# Patient Record
Sex: Female | Born: 1945
Health system: Southern US, Community
[De-identification: ages and names within clinical notes are randomized; demographics above are authoritative.]

## PROBLEM LIST (undated history)

## (undated) DIAGNOSIS — K219 Gastro-esophageal reflux disease without esophagitis: Secondary | ICD-10-CM

## (undated) DIAGNOSIS — Z98811 Dental restoration status: Secondary | ICD-10-CM

## (undated) DIAGNOSIS — E039 Hypothyroidism, unspecified: Secondary | ICD-10-CM

## (undated) DIAGNOSIS — Z9221 Personal history of antineoplastic chemotherapy: Secondary | ICD-10-CM

## (undated) DIAGNOSIS — Z923 Personal history of irradiation: Secondary | ICD-10-CM

## (undated) DIAGNOSIS — M1990 Unspecified osteoarthritis, unspecified site: Secondary | ICD-10-CM

## (undated) DIAGNOSIS — Z853 Personal history of malignant neoplasm of breast: Secondary | ICD-10-CM

## (undated) DIAGNOSIS — R05 Cough: Secondary | ICD-10-CM

## (undated) DIAGNOSIS — E785 Hyperlipidemia, unspecified: Secondary | ICD-10-CM

## (undated) DIAGNOSIS — J3489 Other specified disorders of nose and nasal sinuses: Secondary | ICD-10-CM

## (undated) DIAGNOSIS — D72819 Decreased white blood cell count, unspecified: Secondary | ICD-10-CM

## (undated) DIAGNOSIS — Z8489 Family history of other specified conditions: Secondary | ICD-10-CM

## (undated) HISTORY — DX: Hypothyroidism, unspecified: E03.9

## (undated) HISTORY — DX: Gastro-esophageal reflux disease without esophagitis: K21.9

## (undated) HISTORY — PX: VARICOSE VEIN SURGERY: SHX832

## (undated) HISTORY — PX: CHOLECYSTECTOMY: SHX55

## (undated) HISTORY — DX: Decreased white blood cell count, unspecified: D72.819

## (undated) HISTORY — DX: Hyperlipidemia, unspecified: E78.5

---

## 1981-08-19 HISTORY — PX: TUBAL LIGATION: SHX77

## 1988-08-19 HISTORY — PX: KNEE ARTHROSCOPY: SUR90

## 1997-11-01 ENCOUNTER — Other Ambulatory Visit: Admission: RE | Admit: 1997-11-01 | Discharge: 1997-11-01 | Payer: Self-pay | Admitting: Obstetrics & Gynecology

## 1997-12-19 ENCOUNTER — Other Ambulatory Visit: Admission: RE | Admit: 1997-12-19 | Discharge: 1997-12-19 | Payer: Self-pay | Admitting: Obstetrics & Gynecology

## 1998-12-21 ENCOUNTER — Other Ambulatory Visit: Admission: RE | Admit: 1998-12-21 | Discharge: 1998-12-21 | Payer: Self-pay | Admitting: Obstetrics & Gynecology

## 2000-01-21 ENCOUNTER — Other Ambulatory Visit: Admission: RE | Admit: 2000-01-21 | Discharge: 2000-01-21 | Payer: Self-pay | Admitting: Obstetrics & Gynecology

## 2001-06-19 ENCOUNTER — Ambulatory Visit (HOSPITAL_COMMUNITY): Admission: RE | Admit: 2001-06-19 | Discharge: 2001-06-19 | Payer: Self-pay | Admitting: Gastroenterology

## 2001-09-24 ENCOUNTER — Other Ambulatory Visit: Admission: RE | Admit: 2001-09-24 | Discharge: 2001-09-24 | Payer: Self-pay | Admitting: Obstetrics & Gynecology

## 2003-02-01 ENCOUNTER — Encounter (INDEPENDENT_AMBULATORY_CARE_PROVIDER_SITE_OTHER): Payer: Self-pay | Admitting: Specialist

## 2003-02-01 ENCOUNTER — Ambulatory Visit (HOSPITAL_COMMUNITY): Admission: RE | Admit: 2003-02-01 | Discharge: 2003-02-01 | Payer: Self-pay | Admitting: Specialist

## 2003-02-01 HISTORY — PX: CYST EXCISION: SHX5701

## 2003-04-28 ENCOUNTER — Other Ambulatory Visit: Admission: RE | Admit: 2003-04-28 | Discharge: 2003-04-28 | Payer: Self-pay | Admitting: Obstetrics & Gynecology

## 2004-08-01 ENCOUNTER — Other Ambulatory Visit: Admission: RE | Admit: 2004-08-01 | Discharge: 2004-08-01 | Payer: Self-pay | Admitting: Obstetrics & Gynecology

## 2005-09-06 ENCOUNTER — Other Ambulatory Visit: Admission: RE | Admit: 2005-09-06 | Discharge: 2005-09-06 | Payer: Self-pay | Admitting: Obstetrics & Gynecology

## 2006-09-02 ENCOUNTER — Ambulatory Visit: Payer: Self-pay | Admitting: Family Medicine

## 2006-12-09 ENCOUNTER — Ambulatory Visit: Payer: Self-pay | Admitting: Family Medicine

## 2007-07-01 ENCOUNTER — Ambulatory Visit: Payer: Self-pay | Admitting: Family Medicine

## 2007-08-24 ENCOUNTER — Ambulatory Visit: Payer: Self-pay | Admitting: Family Medicine

## 2007-09-30 ENCOUNTER — Ambulatory Visit: Payer: Self-pay | Admitting: Family Medicine

## 2007-10-18 HISTORY — PX: COLONOSCOPY: SHX174

## 2007-10-29 ENCOUNTER — Ambulatory Visit: Payer: Self-pay | Admitting: Family Medicine

## 2007-12-01 ENCOUNTER — Ambulatory Visit: Payer: Self-pay | Admitting: Family Medicine

## 2007-12-15 ENCOUNTER — Ambulatory Visit: Payer: Self-pay | Admitting: Family Medicine

## 2007-12-31 ENCOUNTER — Encounter: Admission: RE | Admit: 2007-12-31 | Discharge: 2007-12-31 | Payer: Self-pay | Admitting: Family Medicine

## 2007-12-31 ENCOUNTER — Ambulatory Visit: Payer: Self-pay | Admitting: Family Medicine

## 2008-07-28 ENCOUNTER — Ambulatory Visit: Payer: Self-pay | Admitting: Family Medicine

## 2008-09-26 ENCOUNTER — Ambulatory Visit: Payer: Self-pay | Admitting: Family Medicine

## 2008-10-13 ENCOUNTER — Ambulatory Visit: Payer: Self-pay | Admitting: Family Medicine

## 2008-11-17 ENCOUNTER — Ambulatory Visit: Payer: Self-pay | Admitting: Family Medicine

## 2008-11-21 ENCOUNTER — Encounter: Admission: RE | Admit: 2008-11-21 | Discharge: 2008-11-21 | Payer: Self-pay | Admitting: Obstetrics & Gynecology

## 2009-01-11 ENCOUNTER — Ambulatory Visit: Payer: Self-pay | Admitting: Family Medicine

## 2009-09-11 ENCOUNTER — Ambulatory Visit: Payer: Self-pay | Admitting: Family Medicine

## 2009-11-21 ENCOUNTER — Ambulatory Visit: Payer: Self-pay | Admitting: Family Medicine

## 2009-11-30 ENCOUNTER — Encounter: Admission: RE | Admit: 2009-11-30 | Discharge: 2009-11-30 | Payer: Self-pay | Admitting: Obstetrics & Gynecology

## 2010-01-01 ENCOUNTER — Ambulatory Visit: Payer: Self-pay | Admitting: Physician Assistant

## 2010-08-28 ENCOUNTER — Ambulatory Visit: Admit: 2010-08-28 | Payer: Self-pay | Admitting: Family Medicine

## 2010-10-04 ENCOUNTER — Ambulatory Visit (INDEPENDENT_AMBULATORY_CARE_PROVIDER_SITE_OTHER): Payer: BC Managed Care – PPO | Admitting: Family Medicine

## 2010-10-04 DIAGNOSIS — J0100 Acute maxillary sinusitis, unspecified: Secondary | ICD-10-CM

## 2010-11-06 ENCOUNTER — Other Ambulatory Visit: Payer: Self-pay | Admitting: Obstetrics & Gynecology

## 2010-11-06 DIAGNOSIS — Z1231 Encounter for screening mammogram for malignant neoplasm of breast: Secondary | ICD-10-CM

## 2010-12-04 ENCOUNTER — Ambulatory Visit
Admission: RE | Admit: 2010-12-04 | Discharge: 2010-12-04 | Disposition: A | Discharge status: Home / Self Care | Payer: BC Managed Care – PPO | Source: Ambulatory Visit | Attending: Obstetrics & Gynecology | Admitting: Obstetrics & Gynecology

## 2010-12-04 DIAGNOSIS — Z1231 Encounter for screening mammogram for malignant neoplasm of breast: Secondary | ICD-10-CM

## 2010-12-07 ENCOUNTER — Encounter: Payer: Self-pay | Admitting: Family Medicine

## 2010-12-08 ENCOUNTER — Encounter: Payer: Self-pay | Admitting: Family Medicine

## 2010-12-12 HISTORY — PX: KNEE ARTHROSCOPY: SHX127

## 2010-12-31 ENCOUNTER — Ambulatory Visit (INDEPENDENT_AMBULATORY_CARE_PROVIDER_SITE_OTHER): Payer: BC Managed Care – PPO | Admitting: Family Medicine

## 2010-12-31 ENCOUNTER — Other Ambulatory Visit: Payer: Self-pay | Admitting: Family Medicine

## 2010-12-31 ENCOUNTER — Encounter: Payer: Self-pay | Admitting: Family Medicine

## 2010-12-31 DIAGNOSIS — Z0000 Encounter for general adult medical examination without abnormal findings: Secondary | ICD-10-CM

## 2010-12-31 DIAGNOSIS — E7800 Pure hypercholesterolemia, unspecified: Secondary | ICD-10-CM | POA: Insufficient documentation

## 2010-12-31 DIAGNOSIS — K219 Gastro-esophageal reflux disease without esophagitis: Secondary | ICD-10-CM | POA: Insufficient documentation

## 2010-12-31 DIAGNOSIS — E559 Vitamin D deficiency, unspecified: Secondary | ICD-10-CM

## 2010-12-31 DIAGNOSIS — Z23 Encounter for immunization: Secondary | ICD-10-CM

## 2010-12-31 DIAGNOSIS — E039 Hypothyroidism, unspecified: Secondary | ICD-10-CM

## 2010-12-31 LAB — POCT URINALYSIS DIPSTICK
Bilirubin, UA: NEGATIVE
Blood, UA: NEGATIVE
Leukocytes, UA: NEGATIVE
Nitrite, UA: NEGATIVE
Spec Grav, UA: NEGATIVE

## 2010-12-31 LAB — LIPID PANEL
Total CHOL/HDL Ratio: 3.8 Ratio
Triglycerides: 132 mg/dL (ref <150)
VLDL: 26 mg/dL (ref 0–40)

## 2010-12-31 NOTE — Progress Notes (Signed)
Mckenzie Webb is a 65 y.o. female who presents for a complete physical.  She has the following concerns: None.  She sees Dr. Stann Mainland for her GYN, last exam 2 weeks ago.  Had knee surgery L knee a few weeks ago, and is doing well.  Needs all rx's refilled (Nexium, Lovaza, levothyroxine)--actually doesn't need refills right away.  Can't recall name of new pharmacy. Will call with name when she needs the refills (for a year supply).  Takes the Nexium every day, never has reflux symptoms.  Had labs done in January at work--normal CBC (except WBC 3.7), TSH 2.810, Vitamin D was low at 24.7 but added 1000 IU of Vitamin D daily since then.  TSH was increased from 88 to 100 in November (November labs showed TSH 5.090).  Immunization History  Administered Date(s) Administered  . DTaP 04/13/2001  . Influenza Whole 05/19/2000, 05/19/2001  . Pneumococcal Conjugate 06/16/2000   Last Pap smear: per Dr. Stann Mainland, 2 weeks ago Last mammogram: 2 weeks ago Last colonoscopy: 3/09 Last DEXA: 11/2009--normal Gets flu shots annually. H/o shingles in past (face) Dentist and ophtho regularly  Past Medical History  Diagnosis Date  . Thyroid disease   . DDD (degenerative disc disease), cervical   . Psoriasis   . GERD (gastroesophageal reflux disease)   . Allergy   . Interstitial cystitis   . Leukopenia   . Edema   . Hyperlipidemia   . Diabetes mellitus   . Obesity   . DJD (degenerative joint disease), cervical 04 2002  . Hypothyroid     Past Surgical History  Procedure Date  . Cholecystectomy   . Bilateral tubal ligation 1983  . Knee arthroscopy 1990    right knee  . Colonoscopy 3/09  . Diagnostic mammogram 10 27 2003    negative  . Varicose vein surgery 09/2009 R, 06/2010 L  . Knee arthroscopy 12/12/2010    Left knee (Dr. Noemi Chapel)    History   Social History  . Marital Status: Married    Spouse Name: N/A    Number of Children: N/A  . Years of Education: N/A   Occupational History  . Not on  file.   Social History Main Topics  . Smoking status: Never Smoker   . Smokeless tobacco: Not on file  . Alcohol Use: Yes     social, 1-2 times per year  . Drug Use: No  . Sexually Active: Yes   Other Topics Concern  . Not on file   Social History Narrative  . No narrative on file    Family History  Problem Relation Age of Onset  . Hypertension Mother   . Heart disease Mother   . Gallbladder disease Mother   . Thyroid disease Mother   . Arthritis Mother   . Diabetes Father   . Cancer Father     pancreatic  . Hypertension Sister   . Gallbladder disease Sister   . Diabetes Sister   . Cancer Son     testicular cancer  . Stroke Sister   . Hypertension Sister   . Pulmonary embolism Sister     Current outpatient prescriptions:Calcium-Vitamin D (CALTRATE 600 PLUS-VIT D PO), Take 1 tablet by mouth daily.  , Disp: , Rfl: ;  Cholecalciferol (VITAMIN D) 1000 UNITS capsule, Take 1,000 Units by mouth daily.  , Disp: , Rfl: ;  co-enzyme Q-10 30 MG capsule, Take 100 mg by mouth daily.  , Disp: , Rfl: ;  Cyanocobalamin (VITAMIN B 12  PO), Take 1 tablet by mouth.  , Disp: , Rfl:  levothyroxine (SYNTHROID, LEVOTHROID) 100 MCG tablet, Take 100 mcg by mouth daily.  , Disp: , Rfl: ;  omega-3 acid ethyl esters (LOVAZA) 1 G capsule, Take 1 g by mouth 2 (two) times daily.  , Disp: , Rfl: ;  Red Yeast Rice Extract (RED YEAST RICE PO), Take 4 tablets by mouth daily.  , Disp: , Rfl: ;  DISCONTD: calcium citrate-vitamin D (CITRACAL+D) 315-200 MG-UNIT per tablet, Take 1 tablet by mouth daily.  , Disp: , Rfl:  clobetasol (TEMOVATE) 0.05 % cream, , Disp: , Rfl: ;  DISCONTD: calcium-vitamin D 250-100 MG-UNIT per tablet, Take 1 tablet by mouth daily.  , Disp: , Rfl: ;  DISCONTD: esomeprazole (NEXIUM) 40 MG capsule, Take 40 mg by mouth daily before breakfast.  , Disp: , Rfl: ;  DISCONTD: levothyroxine (SYNTHROID, LEVOTHROID) 88 MCG tablet, Take 88 mcg by mouth daily. , Disp: , Rfl:   Allergies  Allergen  Reactions  . Codeine Nausea Only  . Iodine Swelling  . Polysporin (Bacitracin-Polymyxin B) Swelling    Used for eye infection--got redder and worse  . Sulfa Antibiotics Other (See Comments)    Does not work for [employer]  . Penicillins Rash   The patient denies anorexia, fever, weight changes, headaches,  vision changes, decreased hearing, ear pain, sore throat, breast concerns, chest pain, palpitations, dizziness, syncope, dyspnea on exertion, cough, swelling, nausea, vomiting, diarrhea, constipation, abdominal pain, melena, hematochezia, indigestion/heartburn, hematuria, incontinence, dysuria, postmenopausal bleeding, vaginal discharge, odor or itch, joint pains, numbness, tingling, weakness, tremor, suspicious skin lesions, depression, anxiety, abnormal bleeding/bruising, or enlarged lymph nodes.  +ROS: some psoriasis on elbows and buttocks, well controlled with Rx creams  Physical Exam: BP 138/72  Pulse 68  Ht 5' 4"  (1.626 m)  Wt 189 lb (85.73 kg)  BMI 32.44 kg/m2  General Appearance:    Alert, cooperative, no distress, appears stated age  Head:    Normocephalic, without obvious abnormality, atraumatic  Eyes:    PERRL, conjunctiva/corneas clear, EOM's intact, fundi    benign  Ears:    Normal TM's and external ear canals  Nose:   Nares normal, mucosa normal, no drainage or sinus   tenderness  Throat:   Lips, mucosa, and tongue normal; teeth and gums normal  Neck:   Supple, no lymphadenopathy;  thyroid:  no   enlargement/tenderness/nodules; no carotid   bruit or JVD  Back:    Spine nontender, no curvature, ROM normal, no CVA     tenderness  Lungs:     Clear to auscultation bilaterally without wheezes, rales or     ronchi; respirations unlabored  Chest Wall:    No tenderness or deformity   Heart:    Regular rate and rhythm, S1 and S2 normal, no murmur, rub   or gallop  Breast Exam:    Deferred to GYN  Abdomen:     Soft, non-tender, nondistended, normoactive bowel sounds,     no masses, no hepatosplenomegaly  Genitalia:    Deferred to GYN     Extremities:   No clubbing, cyanosis or edema  Pulses:   2+ and symmetric all extremities  Skin:   Skin color, texture, turgor normal, no rashes or lesions  Lymph nodes:   Cervical, supraclavicular, and axillary nodes normal  Neurologic:   CNII-XII intact, normal strength, sensation and gait; reflexes 2+ and symmetric throughout          Psych:   Normal  mood, affect, hygiene and grooming.    1. Routine general medical examination at a health care facility  POCT urinalysis dipstick, Glucose, random  2. Pure hypercholesterolemia  Lipid panel  3. Unspecified vitamin D deficiency  Vitamin D 25 hydroxy  4. Unspecified hypothyroidism  TSH  5. GERD (gastroesophageal reflux disease)     On meds daily without symptoms.  Will try decreasing to qod (and then prn, if able).  Risks of long-term use of med vs untreated reflux discussed  6. Need for tetanus booster  Tdap vaccine greater than or equal to 7yo IM   Discussed monthly self breast exams and yearly mammograms after the age of 24; at least 30 minutes of aerobic activity at least 5 days/week; proper sunscreen use reviewed; healthy diet, including goals of calcium and vitamin D intake and alcohol recommendations (less than or equal to 1 drink/day) reviewed; regular seatbelt use; changing batteries in smoke detectors.  Immunization recommendations discussed--she will take the Zostavax next year.  Colonoscopy recommendations reviewed.  BP elevated today, normal at recent visit.  Will periodically check BP elsewhere, normal values reviewed

## 2011-01-02 ENCOUNTER — Telehealth: Payer: Self-pay | Admitting: *Deleted

## 2011-01-02 ENCOUNTER — Other Ambulatory Visit: Payer: Self-pay | Admitting: *Deleted

## 2011-01-02 DIAGNOSIS — E039 Hypothyroidism, unspecified: Secondary | ICD-10-CM

## 2011-01-02 MED ORDER — LEVOTHYROXINE SODIUM 88 MCG PO TABS
88.0000 ug | ORAL_TABLET | Freq: Every day | ORAL | Status: DC
Start: 2011-01-02 — End: 2013-03-01

## 2011-01-02 NOTE — Telephone Encounter (Signed)
Spoke with patient gave her lab results. Called in 58mg levothyroxine, down from 1065m to [place] Re[place]ullinPt will call me back tomorrow to sch 6 week TSH recheck (244.9).

## 2011-01-02 NOTE — Telephone Encounter (Signed)
Pt notifed of lab results. Synthroid 4mg [facility] [place] Patient stated that she would check her schedule and call back tomorrow to sch 6 week TSH lab. Also spoke with her rNZ:DKEUVH she will continue current meds of red yeast rice and lovaza and recheck in 6 months

## 2011-01-03 ENCOUNTER — Encounter: Payer: Self-pay | Admitting: *Deleted

## 2011-01-03 NOTE — Progress Notes (Signed)
Mailed patient copy of labs per her request.vs

## 2011-01-04 NOTE — Procedures (Signed)
[place]  Patient:    RAENAH, MURLEY Visit Number: [id number] MRN: [id number]          Service Type: END Location: ENDO Attending Physician:  Juanita Craver Dictated by:   Nelwyn Salisbury, M.D. Proc. Date: 06/19/01 Admit Date:  06/19/2001   CC:         Sharene Butters, M.D.   Procedure Report  DATE OF BIRTH:  [birth year].  PROCEDURE:  Colonoscopy.  ENDOSCOPIST:  Nelwyn Salisbury, M.D.  INSTRUMENT USED:  Olympus video colonoscope.  INDICATION FOR PROCEDURE:  A 65 year old white female with a history of rectal bleeding.  Rule out colonic polyps, masses, hemorrhoids, etc.  PREPROCEDURE PREPARATION:  Informed consent was procured from the patient. The patient was fasted for eight hours prior to the procedure and prepped with Visicol tablets prior to the procedure.  PREPROCEDURE PHYSICAL:  VITAL SIGNS:  The patient had stable vital signs.  NECK:  Supple.  CHEST:  Clear to auscultation.  S1, S2 regular.  ABDOMEN:  Soft with normal bowel sounds.  DESCRIPTION OF PROCEDURE:  The patient was placed in the left lateral decubitus position and sedated with 75 mg of Demerol and 7.5 mg of Versed intravenously.  Once the patient was adequately sedate and maintained on low-flow oxygen and continuous cardiac monitoring, the Olympus video colonoscope was advanced from the rectum to the cecum without difficulty.  The rectum, left colon, transverse colon appeared healthy, but there was a large amount of residual stool in the right colon and small lesions could have been missed.  However, except for internal hemorrhoids, no other abnormalities were seen, and no masses or polyps were present.  IMPRESSION: 1. A healthy-appearing colon except for some residual stool in the right    colon.  Small lesions could have been missed on the right side. 2. No masses or polyps seen. 3. Internal hemorrhoid appreciated on retroflexion in the  rectum.  RECOMMENDATIONS: 1. A high-fiber diet has been recommended for the patient. 2. Repeat colorectal cancer screening is recommended in the next five years    unless the patient were to develop any abnormal symptoms in the interim. 3. Outpatient follow-up on a p.r.n. basis. Dictated by:   Nelwyn Salisbury, M.D. Attending Physician:  Juanita Craver DD:  06/19/01 TD:  06/20/01 Job: [id number]

## 2011-01-04 NOTE — Op Note (Signed)
   NAME:  Mckenzie Webb, Mckenzie Webb NO.:  [id number]   MEDICAL RECORD NO.:  [id number]                   PATIENT TYPE:  AMB   LOCATION:  DAY                                  FACILITY:  [facility]   PHYSICIAN:  Laurence Slate, M.D.                DATE OF BIRTH:  [birth year]   DATE OF PROCEDURE:  02/01/2003  DATE OF DISCHARGE:                                 OPERATIVE REPORT   SURGEON:  Laurence Slate, M.D.   PROCEDURE:  After suitable general anesthesia, the knee is prepped and  draped routinely.  After elevation of the leg, an upper thigh tourniquet was  inflated to 350 mmHg.  A linear incision is made over the cyst, which  overlies the proximal portion of the patellar tendon.  The incision opens  the cyst with clear fluid extruding.  The cyst is then removed with the  scissors, taking a portion of the patellar tendon sheath with it.  The  tourniquet is let down.  Hemostasis with the Bovie, 3-0 Vicryl coated  subcuticulars, and then a running Monocryl with Steri-Strips.  About 8 mL of  0.5% Marcaine with adrenalin are injected in the area.  A compression  dressing on top of this.  She goes to recovery in good condition.                                               Laurence Slate, M.D.    PC/MEDQ  D:  02/01/2003  T:  02/01/2003  Job:  [id number]

## 2011-01-07 ENCOUNTER — Telehealth: Payer: Self-pay | Admitting: Family Medicine

## 2011-01-07 LAB — GLUCOSE, RANDOM

## 2011-01-07 NOTE — Progress Notes (Signed)
I called pt, advised of labs, she said [place] had already advised her.

## 2011-01-07 NOTE — Progress Notes (Signed)
Spoke with Mckenzie Webb re: random glucose not being done.  Will add on

## 2011-01-08 NOTE — Telephone Encounter (Signed)
Pt aware.

## 2011-02-06 ENCOUNTER — Other Ambulatory Visit: Payer: Self-pay | Admitting: *Deleted

## 2011-02-06 DIAGNOSIS — K219 Gastro-esophageal reflux disease without esophagitis: Secondary | ICD-10-CM

## 2011-02-06 DIAGNOSIS — L409 Psoriasis, unspecified: Secondary | ICD-10-CM

## 2011-02-06 MED ORDER — CLOBETASOL PROPIONATE 0.05 % EX CREA: TOPICAL_CREAM | Freq: Two times a day (BID) | CUTANEOUS | Status: DC

## 2011-02-06 MED ORDER — ESOMEPRAZOLE MAGNESIUM 40 MG PO CPDR: 40.0000 mg | DELAYED_RELEASE_CAPSULE | Freq: Every day | ORAL | Status: DC

## 2011-02-18 ENCOUNTER — Other Ambulatory Visit: Payer: Self-pay | Admitting: Medical

## 2011-02-18 ENCOUNTER — Telehealth: Payer: Self-pay | Admitting: Family Medicine

## 2011-02-18 MED ORDER — OMEGA-3-ACID ETHYL ESTERS 1 G PO CAPS: 1.0000 g | ORAL_CAPSULE | Freq: Two times a day (BID) | ORAL | Status: DC

## 2011-02-18 NOTE — Telephone Encounter (Signed)
meds sent to [facility]

## 2011-09-11 ENCOUNTER — Encounter: Payer: Self-pay | Admitting: Family Medicine

## 2011-09-11 ENCOUNTER — Ambulatory Visit (INDEPENDENT_AMBULATORY_CARE_PROVIDER_SITE_OTHER): Payer: BC Managed Care – PPO | Admitting: Family Medicine

## 2011-09-11 VITALS — BP 148/86 | HR 64 | Temp 98.2°F | Ht 64.0 in | Wt 174.0 lb

## 2011-09-11 DIAGNOSIS — N390 Urinary tract infection, site not specified: Secondary | ICD-10-CM

## 2011-09-11 DIAGNOSIS — R350 Frequency of micturition: Secondary | ICD-10-CM

## 2011-09-11 LAB — POCT URINALYSIS DIPSTICK
Glucose, UA: NEGATIVE
Nitrite, UA: NEGATIVE
pH, UA: 7

## 2011-09-11 MED ORDER — CIPROFLOXACIN HCL 250 MG PO TABS: 250.0000 mg | ORAL_TABLET | Freq: Two times a day (BID) | ORAL | Status: AC

## 2011-09-11 NOTE — Patient Instructions (Signed)
Please call the office in 2-4 days if symptoms have not resolved (to check on urine culture results). Return if ongoing symptoms, fever, kidney/flank pain, vomiting or other concerns.  Take all of the 7 days of antibiotics, and drink plenty of fluids

## 2011-09-11 NOTE — Progress Notes (Signed)
Chief complaint: frequency and lower abdominal pain constantly. Pain increases when bladder is full. LBP. x 2-3 weeks  HPI: Began with pelvic pain at night when her bladder was full, relieved by emptying her bladder.  The pelvic discomfort is now more constant, also having urgency and frequency. Denies dysuria.  She has h/o interstitial cystitis, but hasn't flared for years.  Denies fevers.  Having some low back pain for a couple of weeks.  Denies nausea or vomiting. Denies vaginal discharge, odor or itch, and denies any prolapse. Hasn't had UTI in quite a while.  Recalls that Sulfa doesn't seem to work for her.  She recently sent in labs from work, and also brought in additional labs today. BP's at work have been fine.  LDH was slightly high, glu 101, A1c 5.7, and ALT was slightly high on initial labs, but normal on repeat.  Past Medical History  Diagnosis Date  . Thyroid disease   . DDD (degenerative disc disease), cervical   . Psoriasis   . GERD (gastroesophageal reflux disease)   . Allergy   . Interstitial cystitis   . Leukopenia   . Edema   . Hyperlipidemia   . Diabetes mellitus   . Obesity   . DJD (degenerative joint disease), cervical 04 2002  . Hypothyroid     Past Surgical History  Procedure Date  . Cholecystectomy   . Bilateral tubal ligation 1983  . Knee arthroscopy 1990    right knee  . Colonoscopy 3/09  . Diagnostic mammogram 10 27 2003    negative  . Varicose vein surgery 09/2009 R, 06/2010 L  . Knee arthroscopy 12/12/2010    Left knee (Dr. Noemi Chapel)    History   Social History  . Marital Status: Married    Spouse Name: N/A    Number of Children: 2  . 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 Smoking status: Never Smoker   . Smokeless tobacco: Not on file  . Alcohol Use: Yes     social, 1-2 times per year  . Drug Use: No  . Sexually Active: Yes   Other Topics Concern  . Not on  file   Social History Narrative  . No narrative on file    Family History  Problem Relation Age of Onset  . Hypertension Mother   . Heart disease Mother   . Gallbladder disease Mother   . Thyroid disease Mother   . Arthritis Mother   . Diabetes Father   . Cancer Father     pancreatic  . Hypertension Sister   . Gallbladder disease Sister   . Diabetes Sister   . Cancer Son     testicular cancer  . Stroke Sister   . Hypertension Sister   . Pulmonary embolism Sister    Current Outpatient Prescriptions on File Prior to Visit  Medication Sig Dispense Refill  . Calcium-Vitamin D (CALTRATE 600 PLUS-VIT D PO) Take 1 tablet by mouth daily.        . Cholecalciferol (VITAMIN D) 1000 UNITS capsule Take 1,000 Units by mouth daily.        . clobetasol (TEMOVATE) 0.05 % cream Apply topically 2 (two) times daily.  135 g  2  . co-enzyme Q-10 30 MG capsule Take 100 mg by mouth daily.        . Cyanocobalamin (VITAMIN B 12 PO) Take 1 tablet by mouth.        Marland Kitchen  levothyroxine (SYNTHROID) 88 MCG tablet Take 1 tablet (88 mcg total) by mouth daily.  30 tablet  1  . omega-3 acid ethyl esters (LOVAZA) 1 G capsule Take 1 capsule (1 g total) by mouth 2 (two) times daily.  180 capsule  2  . Red Yeast Rice Extract (RED YEAST RICE PO) Take 4 tablets by mouth daily.          Allergies  Allergen Reactions  . Codeine Nausea Only  . Iodine Swelling  . Polysporin (Bacitracin-Polymyxin B) Swelling    Used for eye infection--got redder and worse  . Sulfa Antibiotics Other (See Comments)    Does not work for [employer]  . Penicillins Rash   ROS:  Denies fevers, URI symptoms, chest pain, headaches, rashes, vomiting or diarrhea.  See HPI  PHYSICAL EXAM: BP 148/86  Pulse 64  Temp(Src) 98.2 F (36.8 C) (Oral)  Ht 5' 4"  (1.626 m)  Wt 174 lb (78.926 kg)  BMI 29.87 kg/m2 Heart: regular rate and rhythm without murmur Lungs: clear bilaterally Back: no cva tenderness or spine tenderness Abdomen:  nontender  1+ blood and LE on urine dip   ASSESSMENT/PLAN:  1. Urinary tract infection, site not specified  CULTURE, URINE COMPREHENSIVE, ciprofloxacin (CIPRO) 250 MG tablet  2. Urinary frequency  POCT Urinalysis Dipstick   If urine culture is negative, and symptoms persist, may be from UC and refer back to urology. Pt is to call in 2-3 days if symptoms not improving, to check on urine culture results

## 2011-09-16 ENCOUNTER — Other Ambulatory Visit: Payer: Self-pay | Admitting: *Deleted

## 2011-09-16 DIAGNOSIS — N390 Urinary tract infection, site not specified: Secondary | ICD-10-CM

## 2011-09-16 MED ORDER — NITROFURANTOIN MONOHYD MACRO 100 MG PO CAPS: 100.0000 mg | ORAL_CAPSULE | Freq: Two times a day (BID) | ORAL | Status: DC

## 2011-09-18 LAB — CULTURE, URINE COMPREHENSIVE: Colony Count: 30000

## 2011-10-29 ENCOUNTER — Other Ambulatory Visit: Payer: Self-pay | Admitting: Obstetrics & Gynecology

## 2011-10-29 DIAGNOSIS — Z1231 Encounter for screening mammogram for malignant neoplasm of breast: Secondary | ICD-10-CM

## 2011-12-05 ENCOUNTER — Ambulatory Visit
Admission: RE | Admit: 2011-12-05 | Discharge: 2011-12-05 | Disposition: A | Discharge status: Home / Self Care | Payer: BC Managed Care – PPO | Source: Ambulatory Visit | Attending: Obstetrics & Gynecology | Admitting: Obstetrics & Gynecology

## 2011-12-05 ENCOUNTER — Telehealth: Payer: Self-pay | Admitting: Family Medicine

## 2011-12-05 DIAGNOSIS — E7800 Pure hypercholesterolemia, unspecified: Secondary | ICD-10-CM

## 2011-12-05 DIAGNOSIS — Z1231 Encounter for screening mammogram for malignant neoplasm of breast: Secondary | ICD-10-CM

## 2011-12-05 MED ORDER — OMEGA-3-ACID ETHYL ESTERS 1 G PO CAPS: 1.0000 g | ORAL_CAPSULE | Freq: Two times a day (BID) | ORAL | Status: DC

## 2011-12-05 NOTE — Telephone Encounter (Signed)
E scribed Lovaza x 90 days as pt has CPE scheduled for June 2013 with Dr.Knapp.

## 2011-12-23 ENCOUNTER — Other Ambulatory Visit: Payer: Self-pay | Admitting: Obstetrics & Gynecology

## 2011-12-23 DIAGNOSIS — R102 Pelvic and perineal pain: Secondary | ICD-10-CM

## 2011-12-26 ENCOUNTER — Ambulatory Visit
Admission: RE | Admit: 2011-12-26 | Discharge: 2011-12-26 | Disposition: A | Discharge status: Home / Self Care | Payer: BC Managed Care – PPO | Source: Ambulatory Visit | Attending: Obstetrics & Gynecology | Admitting: Obstetrics & Gynecology

## 2011-12-26 DIAGNOSIS — R102 Pelvic and perineal pain: Secondary | ICD-10-CM

## 2011-12-26 MED ORDER — IOHEXOL 300 MG/ML  SOLN
100.0000 mL | Freq: Once | INTRAMUSCULAR | Status: AC | PRN
  Administered 2011-12-26: 100 mL via INTRAVENOUS

## 2012-01-22 ENCOUNTER — Encounter: Payer: Self-pay | Admitting: Family Medicine

## 2012-01-22 ENCOUNTER — Encounter: Payer: Self-pay | Admitting: *Deleted

## 2012-01-22 ENCOUNTER — Ambulatory Visit (INDEPENDENT_AMBULATORY_CARE_PROVIDER_SITE_OTHER): Payer: BC Managed Care – PPO | Admitting: Family Medicine

## 2012-01-22 VITALS — BP 130/80 | HR 68 | Ht 63.75 in | Wt 175.0 lb

## 2012-01-22 DIAGNOSIS — E7800 Pure hypercholesterolemia, unspecified: Secondary | ICD-10-CM

## 2012-01-22 DIAGNOSIS — D72819 Decreased white blood cell count, unspecified: Secondary | ICD-10-CM

## 2012-01-22 DIAGNOSIS — L408 Other psoriasis: Secondary | ICD-10-CM

## 2012-01-22 DIAGNOSIS — K219 Gastro-esophageal reflux disease without esophagitis: Secondary | ICD-10-CM

## 2012-01-22 DIAGNOSIS — L409 Psoriasis, unspecified: Secondary | ICD-10-CM

## 2012-01-22 DIAGNOSIS — Z0000 Encounter for general adult medical examination without abnormal findings: Secondary | ICD-10-CM

## 2012-01-22 DIAGNOSIS — E039 Hypothyroidism, unspecified: Secondary | ICD-10-CM

## 2012-01-22 DIAGNOSIS — R350 Frequency of micturition: Secondary | ICD-10-CM

## 2012-01-22 DIAGNOSIS — E559 Vitamin D deficiency, unspecified: Secondary | ICD-10-CM

## 2012-01-22 LAB — CBC WITH DIFFERENTIAL/PLATELET
Basophils Relative: 1 % (ref 0–1)
HCT: 40.9 % (ref 36.0–46.0)
Hemoglobin: 14 g/dL (ref 12.0–15.0)
Lymphs Abs: 1.8 10*3/uL (ref 0.7–4.0)
MCHC: 34.2 g/dL (ref 30.0–36.0)
Monocytes Absolute: 0.5 10*3/uL (ref 0.1–1.0)
Monocytes Relative: 9 % (ref 3–12)
Neutro Abs: 2.8 10*3/uL (ref 1.7–7.7)
Neutrophils Relative %: 53 % (ref 43–77)
RBC: 4.48 MIL/uL (ref 3.87–5.11)

## 2012-01-22 LAB — POCT URINALYSIS DIPSTICK
Bilirubin, UA: NEGATIVE
Blood, UA: NEGATIVE
Nitrite, UA: NEGATIVE
Spec Grav, UA: 1.02

## 2012-01-22 LAB — COMPREHENSIVE METABOLIC PANEL
ALT: 22 U/L (ref 0–35)
Albumin: 4.3 g/dL (ref 3.5–5.2)
Alkaline Phosphatase: 45 U/L (ref 39–117)
CO2: 23 mEq/L (ref 19–32)
Glucose, Bld: 95 mg/dL (ref 70–99)
Potassium: 5.1 mEq/L (ref 3.5–5.3)
Sodium: 140 mEq/L (ref 135–145)
Total Protein: 7.1 g/dL (ref 6.0–8.3)

## 2012-01-22 LAB — LIPID PANEL
Cholesterol: 220 mg/dL — ABNORMAL HIGH (ref 0–200)
LDL Cholesterol: 114 mg/dL — ABNORMAL HIGH (ref 0–99)
Triglycerides: 84 mg/dL (ref <150)

## 2012-01-22 LAB — TSH: TSH: 1.654 u[IU]/mL (ref 0.350–4.500)

## 2012-01-22 MED ORDER — CLOBETASOL PROPIONATE 0.05 % EX CREA: TOPICAL_CREAM | Freq: Two times a day (BID) | CUTANEOUS | Status: DC

## 2012-01-22 NOTE — Patient Instructions (Signed)
HEALTH MAINTENANCE RECOMMENDATIONS:  It is recommended that you get at least 30 minutes of aerobic exercise at least 5 days/week (for weight loss, you may need as much as 60-90 minutes). This can be any activity that gets your heart rate up. This can be divided in 10-15 minute intervals if needed, but try and build up your endurance at least once a week.  Weight bearing exercise is also recommended twice weekly.  Eat a healthy diet with lots of vegetables, fruits and fiber.  "Colorful" foods have a lot of vitamins (ie green vegetables, tomatoes, red peppers, etc).  Limit sweet tea, regular sodas and alcoholic beverages, all of which has a lot of calories and sugar.  Up to 1 alcoholic drink daily may be beneficial for women (unless trying to lose weight, watch sugars).  Drink a lot of water.  Calcium recommendations are 1200-1500 mg daily (1500 mg for postmenopausal women or women without ovaries), and vitamin D 1000 IU daily.  This should be obtained from diet and/or supplements (vitamins), and calcium should not be taken all at once, but in divided doses.  Monthly self breast exams and yearly mammograms for women over the age of 18 is recommended.  Sunscreen of at least SPF 30 should be used on all sun-exposed parts of the skin when outside between the hours of 10 am and 4 pm (not just when at beach or pool, but even with exercise, golf, tennis, and yard work!)  Use a sunscreen that says "broad spectrum" so it covers both UVA and UVB rays, and make sure to reapply every 1-2 hours.  Remember to change the batteries in your smoke detectors when changing your clock times in the spring and fall.  Use your seat belt every time you are in a car, and please drive safely and not be distracted with cell phones and texting while driving.

## 2012-01-22 NOTE — Progress Notes (Signed)
Chief Complaint  Patient presents with  . Annual Exam    fasting annual exam no pap-sees Dr.Robert Wein had pap and mammo in April 2013. UA showed 1+ leuks and patient is having some issues with frequency especially at night. Would like to know if you would consider letting her switch to Mega Red from the Lovaza as it is so expensive. Also needs refill on clobetasol.   GERD--has been off all medications and doing well. Only gets heartburn after eating [place] food, mild, doesn't take meds.  Hypothyroidism-- Dose of thyroid medication was decreased from 100 to 88 mcg about 6 months ago by nurse at work.  We have labs in chart from 08/2011 showing TSH of 2.15 but patient can't recall if this was before or after the dose was changed.  Reports feeling like her energy isn't as good since the dose was changed.  Denies any changes in hair, skin, bowels or weight.  Psoriasis--doing well.  Needs refill on temovate cream.  Mostly involves her hips, occasionally at her elbows.  Hyperlipidemia:  Taking red yeast rice, and 2 grams of lovaza daily.  Lovaza is expensive, and is asking about krill oil.  She had LFT's done through work in January, normal.  Also had A1c of 5.7 done then.    Health Maintenance: Immunization History  Administered Date(s) Administered  . DTaP 04/13/2001  . Influenza Split 06/20/2011  . Influenza Whole 05/19/2000, 05/19/2001  . Pneumococcal Conjugate 06/16/2000  . Tdap 12/31/2010   Last Pap smear: 11/2011 Last mammogram: 11/2011 Last colonoscopy: 3/09 Last DEXA: 11/2009, normal Dentist: every 6 months Ophtho: every 2 years, went in March 2013 Exercise: walks 15-20 minutes at work, and then [employer]  Past Medical History  Diagnosis Date  . DDD (degenerative disc disease), cervical   . Psoriasis   . GERD (gastroesophageal reflux disease)   . Allergy   . Interstitial cystitis   . Leukopenia   . Edema   . Hyperlipidemia   . Diabetes mellitus 4/07    pt reports that all  subsequent tests were normal, not diabetic  . Obesity   . DJD (degenerative joint disease), cervical 04 2002  . Hypothyroid     Past Surgical History  Procedure Date  . Cholecystectomy   . Tubal ligation 1983  . Knee arthroscopy 1990    right knee  . Colonoscopy 3/09  . Varicose vein surgery 09/2009 R, 06/2010 L  . Knee arthroscopy 12/12/2010    Left knee (Dr. Noemi Chapel)    History   Social History  . Marital Status: Married    Spouse Name: N/A    Number of Children: 2  . 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 Smoking status: Never Smoker   . Smokeless tobacco: Never Used  . Alcohol Use: Yes     social, 1-2 times per year  . Drug Use: No  . Sexually Active: Yes -- Female partner(s)   Other Topics Concern  . Not on file   Social History Narrative   Lives with husband, no pets.  Children in [place] and [place]. Expecting grandchild in November    Family History  Problem Relation Age of Onset  . Hypertension Mother   . Heart disease Mother   . Gallbladder disease Mother   . Thyroid disease Mother   . Arthritis Mother   . Diabetes Father   . Cancer Father  pancreatic  . Hypertension Sister   . Gallbladder disease Sister   . Diabetes Sister   . Cancer Son     testicular cancer  . Stroke Sister   . Hypertension Sister   . Pulmonary embolism Sister   . Ulcerative colitis Sister     Current Outpatient Prescriptions on File Prior to Visit  Medication Sig Dispense Refill  . Calcium-Vitamin D (CALTRATE 600 PLUS-VIT D PO) Take 1 tablet by mouth daily.        . Cholecalciferol (VITAMIN D) 1000 UNITS capsule Take 1,000 Units by mouth daily.        . clobetasol (TEMOVATE) 0.05 % cream Apply topically 2 (two) times daily.  135 g  2  . co-enzyme Q-10 30 MG capsule Take 100 mg by mouth daily.        . Cyanocobalamin (VITAMIN B 12 PO) Take 1 tablet by mouth.        . omega-3 acid ethyl  esters (LOVAZA) 1 G capsule Take 1 capsule (1 g total) by mouth 2 (two) times daily.  180 capsule  0  . Red Yeast Rice Extract (RED YEAST RICE PO) Take 4 tablets by mouth daily.        Marland Kitchen levothyroxine (SYNTHROID) 88 MCG tablet Take 1 tablet (88 mcg total) by mouth daily.  30 tablet  1  also takes a phyto-estrogen from [facility] daily.  Allergies  Allergen Reactions  . Codeine Nausea Only  . Iodine Swelling  . Polysporin (Bacitracin-Polymyxin B) Swelling    Used for eye infection--got redder and worse  . Sulfa Antibiotics Other (See Comments)    Does not work for [employer]  . Penicillins Rash   ROS:  The patient denies anorexia, fever, weight changes, headaches,  vision changes, decreased hearing, ear pain, sore throat, breast concerns, chest pain, palpitations, dizziness, syncope, dyspnea on exertion, cough, swelling, nausea, vomiting, diarrhea, constipation, abdominal pain, melena, hematochezia, indigestion/heartburn, hematuria, incontinence, dysuria, vaginal bleeding, discharge, odor or itch, genital lesions, joint pains, numbness, tingling, weakness, tremor, suspicious skin lesions, depression, anxiety, abnormal bleeding/bruising, or enlarged lymph nodes.  +urinary frequency, especially at night.  Denies dysuria.  Up every 2 hours to go to bathroom.  No trouble falling asleep. Had R sided pelvic pain.  Had CT which was normal.  Pain has improved.  PHYSICAL EXAM: BP 130/80  Pulse 68  Ht 5' 3.75" (1.619 m)  Wt 175 lb (79.379 kg)  BMI 30.27 kg/m2  General Appearance:    Alert, cooperative, no distress, appears stated age  Head:    Normocephalic, without obvious abnormality, atraumatic  Eyes:    PERRL, conjunctiva/corneas clear, EOM's intact, fundi    benign  Ears:    Normal TM's and external ear canals  Nose:   Nares normal, mucosa normal, no drainage or sinus   tenderness  Throat:   Lips, mucosa, and tongue normal; teeth and gums normal  Neck:   Supple, no lymphadenopathy;  thyroid:   no   enlargement/tenderness/nodules; no carotid   bruit or JVD  Back:    Spine nontender, no curvature, ROM normal, no CVA     tenderness  Lungs:     Clear to auscultation bilaterally without wheezes, rales or     ronchi; respirations unlabored  Chest Wall:    No tenderness or deformity   Heart:    Regular rate and rhythm, S1 and S2 normal, no murmur, rub   or gallop  Breast Exam:    Deferred to GYN  Abdomen:     Soft, non-tender, nondistended, normoactive bowel sounds,    no masses, no hepatosplenomegaly  Genitalia:    Deferred to GYN     Extremities:   No clubbing, cyanosis or edema  Pulses:   2+ and symmetric all extremities  Skin:   Skin color, texture, turgor normal, no rashes or lesions  Lymph nodes:   Cervical, supraclavicular, and axillary nodes normal  Neurologic:   CNII-XII intact, normal strength, sensation and gait; reflexes 2+ and symmetric throughout          Psych:   Normal mood, affect, hygiene and grooming.    ASSESSMENT/PLAN:  1. GERD (gastroesophageal reflux disease)    2. Pure hypercholesterolemia  Comprehensive metabolic panel, Lipid panel  3. Unspecified hypothyroidism  TSH  4. Unspecified vitamin D deficiency    5. Routine general medical examination at a health care facility  POCT Urinalysis Dipstick  6. Leukopenia  CBC with Differential  7. Urinary frequency  Urine culture  8. Psoriasis  clobetasol cream (TEMOVATE) 0.05 %    Discussed monthly self breast exams and yearly mammograms; at least 30 minutes of aerobic activity at least 5 days/week; proper sunscreen use reviewed; healthy diet, including goals of calcium and vitamin D intake and alcohol recommendations (less than or equal to 1 drink/day) reviewed; regular seatbelt use; changing batteries in smoke detectors.  Immunization recommendations discussed.  Colonoscopy recommendations reviewed, UTD.  Discussed zostavax--risks/side effects.  Encouraged to check insurance and schedule nurse visit if  interested.  We discussed that her h/o mild shingles may not give lifelong immunity, so vaccine is recommended.    Urinary frequency--offered medications, discussed side effects.  Will try behavioral changes (take meds earlier, in order to cut back on fluid intake in evenings).  Send urine for culture to r/o infection (has hematuria).  (call pt at work if needed, with results)

## 2012-01-23 ENCOUNTER — Encounter: Payer: Self-pay | Admitting: Family Medicine

## 2012-01-26 LAB — URINE CULTURE: Colony Count: >=100000

## 2012-01-27 ENCOUNTER — Other Ambulatory Visit: Payer: Self-pay | Admitting: *Deleted

## 2012-01-27 ENCOUNTER — Other Ambulatory Visit: Payer: Self-pay

## 2012-01-27 DIAGNOSIS — N390 Urinary tract infection, site not specified: Secondary | ICD-10-CM

## 2012-01-27 MED ORDER — CIPROFLOXACIN HCL 250 MG PO TABS: 250.0000 mg | ORAL_TABLET | Freq: Two times a day (BID) | ORAL | Status: DC

## 2012-01-27 MED ORDER — NITROFURANTOIN MONOHYD MACRO 100 MG PO CAPS: 100.0000 mg | ORAL_CAPSULE | Freq: Two times a day (BID) | ORAL | Status: DC

## 2012-01-27 NOTE — Telephone Encounter (Signed)
Called patient and let her know that we changed rx to Macrobid from [facility] as it will cover the enterococcus as well as the E.Coli. Pt verbalized understanding and I called [facility] in [place] as well.

## 2012-01-27 NOTE — Telephone Encounter (Signed)
Got lab this morning and sent med in and called pt

## 2012-08-05 ENCOUNTER — Ambulatory Visit (INDEPENDENT_AMBULATORY_CARE_PROVIDER_SITE_OTHER): Payer: BC Managed Care – PPO | Admitting: Family Medicine

## 2012-08-05 ENCOUNTER — Encounter: Payer: Self-pay | Admitting: Family Medicine

## 2012-08-05 VITALS — BP 128/84 | HR 64 | Ht 63.75 in | Wt 173.0 lb

## 2012-08-05 DIAGNOSIS — F4321 Adjustment disorder with depressed mood: Secondary | ICD-10-CM

## 2012-08-05 DIAGNOSIS — G44209 Tension-type headache, unspecified, not intractable: Secondary | ICD-10-CM

## 2012-08-05 DIAGNOSIS — M545 Low back pain, unspecified: Secondary | ICD-10-CM

## 2012-08-05 DIAGNOSIS — R102 Pelvic and perineal pain: Secondary | ICD-10-CM

## 2012-08-05 DIAGNOSIS — R109 Unspecified abdominal pain: Secondary | ICD-10-CM

## 2012-08-05 DIAGNOSIS — R51 Headache: Secondary | ICD-10-CM

## 2012-08-05 LAB — POCT URINALYSIS DIPSTICK
Ketones, UA: NEGATIVE
Nitrite, UA: NEGATIVE
Protein, UA: NEGATIVE
Urobilinogen, UA: NEGATIVE
pH, UA: 5

## 2012-08-05 NOTE — Progress Notes (Signed)
Chief Complaint  Patient presents with  . Headache    and drainage and right ear pain x at least 2 weeks.   . Back Pain    x several weeks.   HPI:  Complaining of fatigue, postnasal drainage, pain at her sinuses in her cheeks, and also pain at temples. Has scratchy throat.  Nasal mucus is yellow-y, just in the mornings, clearer during the day.  Taking benadryl at night, which helps her sleep, and helps her symptoms.  Denies cough, fevers.  NP at work gave her prescription for xanax, told her it wasn't her sinuses, but her nerves.  She filled prescription, but has been afraid to take it.    Complaining of low back pain for several weeks.  She thinks it is a UTI.  She is also having some lower abdominal pain.  Denies urgency or frequency, no incontinence.  Her sister died in 06/03/23, rather unexpectedly--became teary in discussing. She is feeling run down, "dragging".  She has been using benadryl every night to help her sleep, which helps.  Not sleeping at all without it.  Denies hopelessness, helplessness.  Denies SI.  She had thyroid checked through NP at work in November, and was told to stay on same dose.  Past Medical History  Diagnosis Date  . DDD (degenerative disc disease), cervical   . Psoriasis   . GERD (gastroesophageal reflux disease)   . Allergy   . Interstitial cystitis   . Leukopenia   . Edema   . Hyperlipidemia   . Diabetes mellitus 4/07    pt reports that all subsequent tests were normal, not diabetic  . Obesity   . DJD (degenerative joint disease), cervical 04 2002  . Hypothyroid    Past Surgical History  Procedure Date  . Cholecystectomy   . Tubal ligation 1983  . Knee arthroscopy 1990    right knee  . Colonoscopy 3/09  . Varicose vein surgery 09/2009 R, 06/2010 L  . Knee arthroscopy 12/12/2010    Left knee (Dr. Noemi Chapel)   History   Social History  . Marital Status: Married    Spouse Name: N/A    Number of Children: 2  . 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 Smoking status: Never Smoker   . Smokeless tobacco: Never Used  . Alcohol Use: Yes     Comment: social, 1-2 times per year  . Drug Use: No  . Sexually Active: Yes -- Female partner(s)   Other Topics Concern  . Not on file   Social History Narrative   Lives with husband, no pets.  Children in [place] and [place]. Expecting grandchild in November   Family History  Problem Relation Age of Onset  . Hypertension Mother   . Heart disease Mother   . Gallbladder disease Mother   . Thyroid disease Mother   . Arthritis Mother   . Diabetes Father   . Cancer Father     pancreatic  . Hypertension Sister   . Gallbladder disease Sister   . Diabetes Sister   . Cancer Son     testicular cancer  . Stroke Sister   . Hypertension Sister   . Pulmonary embolism Sister   . Ulcerative colitis Sister    Current Outpatient Prescriptions on File Prior to Visit  Medication Sig Dispense Refill  . Calcium-Vitamin D (CALTRATE 600 PLUS-VIT D PO) Take 1 tablet by  mouth daily.        . Cholecalciferol (VITAMIN D) 1000 UNITS capsule Take 1,000 Units by mouth daily.        . clobetasol cream (TEMOVATE) 0.05 % Apply topically 2 (two) times daily.  135 g  2  . co-enzyme Q-10 30 MG capsule Take 100 mg by mouth daily.        . Cyanocobalamin (VITAMIN B 12 PO) Take 1 tablet by mouth.        . levothyroxine (SYNTHROID, LEVOTHROID) 88 MCG tablet Take 88 mcg by mouth daily.      . Red Yeast Rice Extract (RED YEAST RICE PO) Take 4 tablets by mouth daily.        Marland Kitchen levothyroxine (SYNTHROID) 88 MCG tablet Take 1 tablet (88 mcg total) by mouth daily.  30 tablet  1   Allergies  Allergen Reactions  . Codeine Nausea Only  . Iodine Swelling  . Polysporin (Bacitracin-Polymyxin B) Swelling    Used for eye infection--got redder and worse  . Sulfa Antibiotics Swelling    Lips swell, itching  . Penicillins Rash   ROS:  Denies nausea, vomiting (since food poisoning after Thanksgiving). She was sick for 12 hours straight, and hasn't really felt right since then. Eating and drinking normally. Denies skin rash, chest pain, palpitations, shortness of breath.  See HPI  PHYSICAL EXAM: BP 128/84  Pulse 64  Ht 5' 3.75" (1.619 m)  Wt 173 lb (78.472 kg)  BMI 29.93 kg/m2 Tearful, full range of affect.  Not ill-appearing HEENT:  PERRL, EOMI.  Conjunctiva clear.  TM's and EACs normal.  Nasal mucosa normal.  Mildly tender over both maxillary sinuses and both temporalis muscles.  nontender over temporal arteries.  OP clear, no erythema, moist mucus membranes. Neck: no lymphadenopathy, thyromegaly or mass Heart: regular rate and rhythm without murmur Lungs: clear bilaterally Abdomen: soft.  Mild suprapubic tenderness.  No mass or organomegaly. Back: no spine or CVA tenderness.  Mild tenderness at SI joints and paraspinous muscles in lower lumbar area. Extremities: no edema Skin: no rash Psych: mildly depressed.  Normal hygiene, grooming, eye contact and speech   Urine dip:  1+ leukocytes, otherwise normal  ASSESSMENT/PLAN:  1. LBP (low back pain)  POCT Urinalysis Dipstick, Urine culture  2. Sinus headache    3. Tension headache    4. Grief reaction    5. Suprapubic pain      Sinus headaches--no evidence of infection.  Sinus rinses, mucinex/guaifenesin, decongestants as needed.  Call/return if worsening of discolored mucus, sinus pain.  Back pain--some component of musculoskeletal pain.  ?any component from possible UTI.  Treat with heat, massage, NSAIDs.  Proper posture.  Back and abdominal pain--send urine for culture and treat if abnormal.  Not treating presumptively given length of time of symptoms, and lack of progression, only borderline u/a.   Depressed mood/grief reaction.  Encouraged counseling (free through Hospice vs looking into EAP through work, vs through insurance).  Consider meds if symptoms  progress/worsen.  Reviewed signs/symptoms of depression.  Declines any bloodwork today.  If symptoms persist/worsen (especially fatigue, moods), will return for further eval.  Discussed risks/side effects and indications for xanax use.  Use just prn

## 2012-08-05 NOTE — Patient Instructions (Signed)
Sinus headaches--no evidence of infection.  Sinus rinses, mucinex/guaifenesin, decongestants as needed.  Call/return if worsening of discolored mucus, sinus pain.  Tension headaches (pain at temples).  Try not to chew a lot of gum, clench or grind teeth.  Anti-inflammatories can help (with headaches and back pain). Stress reduction.  Grief--consider counseling (free through [facility] vs looking into EAP through work, vs through insurance).  Consider medication if symptoms progress/worsen (helpless, hopeless feeling, feeling more down, etc, as we discussed).  Abdominal pain--you might have a bladder infection.  Urine wasn't too abnormal, so I prefer to wait on culture, given that your symptoms have been going on for weeks.  We will call you with antibiotic if culture shows infection.  Back pain--seems muscular in origin (not your kidneys).  Heat, massage and anti-inflammatories will help.  Avoid heavy lifting, bending.  Try Aleve 1-2 tablets twice daily with food for up to 2 weeks maximum.  If pain not improving, return for re-evaluation.

## 2012-09-16 ENCOUNTER — Telehealth: Payer: Self-pay | Admitting: *Deleted

## 2012-09-16 NOTE — Telephone Encounter (Signed)
Spoke with patient and she is feeling better, she will call if anything changes.

## 2012-11-13 ENCOUNTER — Other Ambulatory Visit: Payer: Self-pay

## 2012-11-13 DIAGNOSIS — Z1231 Encounter for screening mammogram for malignant neoplasm of breast: Secondary | ICD-10-CM

## 2012-12-01 LAB — HM PAP SMEAR: HM Pap smear: NORMAL

## 2012-12-21 ENCOUNTER — Ambulatory Visit
Admission: RE | Admit: 2012-12-21 | Discharge: 2012-12-21 | Disposition: A | Discharge status: Home / Self Care | Payer: BC Managed Care – PPO | Source: Ambulatory Visit

## 2012-12-21 DIAGNOSIS — Z1231 Encounter for screening mammogram for malignant neoplasm of breast: Secondary | ICD-10-CM

## 2012-12-21 LAB — HM MAMMOGRAPHY: HM Mammogram: NORMAL

## 2013-03-01 ENCOUNTER — Ambulatory Visit (INDEPENDENT_AMBULATORY_CARE_PROVIDER_SITE_OTHER): Payer: BC Managed Care – PPO | Admitting: Family Medicine

## 2013-03-01 ENCOUNTER — Encounter: Payer: Self-pay | Admitting: Family Medicine

## 2013-03-01 VITALS — BP 148/90 | HR 60 | Temp 98.0°F | Ht 65.0 in | Wt 180.0 lb

## 2013-03-01 DIAGNOSIS — J019 Acute sinusitis, unspecified: Secondary | ICD-10-CM

## 2013-03-01 MED ORDER — AZITHROMYCIN 250 MG PO TABS: ORAL_TABLET | ORAL | Status: DC

## 2013-03-01 NOTE — Progress Notes (Signed)
Chief Complaint  Patient presents with  . Cough    slight cough, severe HA, sinus pain and pressure that has been going on x over 3 weeks. Mucus is yellow in color but is not producing a large amount. Neck glands are sore.   Started out 3 weeks ago with sinus headaches in her cheeks, like a typical cold.  She took Advil cold and sinus, and alka selzer cold and sinus, but nothing has helped.  Symptoms have persisted x 3 weeks.  Possibly has had some low grade fevers, but no high fevers or chills.  Complaining of swollen glands which are tender (bilaterally), headache posteriorly, at the back of her neck, as well.  Having pain across her eyes, forehead, cheeks, teeth.  Having some discomfort in her right ear. Nasal mucus is yellow.  She has a slight cough, mainly dry, only occasionally gets up mucus which is thick and yellow.  Denies any chest tightness, shortness of breath  +exposure to sick grandchild who is in daycare.  Past Medical History  Diagnosis Date  . DDD (degenerative disc disease), cervical   . Psoriasis   . GERD (gastroesophageal reflux disease)   . Allergy   . Interstitial cystitis   . Leukopenia   . Edema   . Hyperlipidemia   . Diabetes mellitus 4/07    pt reports that all subsequent tests were normal, not diabetic  . Obesity   . DJD (degenerative joint disease), cervical 04 2002  . Hypothyroid    Past Surgical History  Procedure Laterality Date  . Cholecystectomy    . Tubal ligation  1983  . Knee arthroscopy  1990    right knee  . Colonoscopy  3/09  . Varicose vein surgery  09/2009 R, 06/2010 L  . Knee arthroscopy  12/12/2010    Left knee (Dr. Noemi Chapel)   History   Social History  . Marital Status: Married    Spouse Name: N/A    Number of Children: 2  . 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 Smoking status: Never Smoker   . Smokeless tobacco: Never Used  . Alcohol Use: Yes   Comment: social, 1-2 times per year  . Drug Use: No  . Sexually Active: Yes -- Female partner(s)   Other Topics Concern  . Not on file   Social History Narrative   Lives with husband, no pets.  Children in [place] and [place]. Expecting grandchild in November    Current Outpatient Prescriptions on File Prior to Visit  Medication Sig Dispense Refill  . Calcium-Vitamin D (CALTRATE 600 PLUS-VIT D PO) Take 1 tablet by mouth daily.        . Cholecalciferol (VITAMIN D) 1000 UNITS capsule Take 1,000 Units by mouth daily.        Marland Kitchen co-enzyme Q-10 30 MG capsule Take 100 mg by mouth daily.        . Cyanocobalamin (VITAMIN B 12 PO) Take 1 tablet by mouth.        . levothyroxine (SYNTHROID, LEVOTHROID) 88 MCG tablet Take 88 mcg by mouth daily.      . Red Yeast Rice Extract (RED YEAST RICE PO) Take 4 tablets by mouth daily.        . clobetasol cream (TEMOVATE) 0.05 % Apply topically 2 (two) times daily.  135 g  2   No current facility-administered medications on file prior to visit.  Allergies  Allergen Reactions  . Codeine Nausea Only  . Iodine Swelling  . Polysporin (Bacitracin-Polymyxin B) Swelling    Used for eye infection--got redder and worse  . Sulfa Antibiotics Swelling    Lips swell, itching  . Penicillins Rash   ROS:  Denies chills, nausea, vomiting, diarrhea, urinary complaints.  Having slight reflux occasionally (hasn't been taking any meds).  Denies any myalgias/arthralgias.  +fatigue.  Denies rashes, easy bleeding/bruising.  +nosebleed one night, no further nosebleeds.  PHYSICAL EXAM: BP 148/90  Pulse 60  Temp(Src) 98 F (36.7 C) (Oral)  Ht 5' 5"  (1.651 m)  Wt 180 lb (81.647 kg)  BMI 29.95 kg/m2  Well developed, pleasant female in no distress HEENT:  PERRL, EOMI, conjunctiva and sclera clear.  TM's and EAC's normal.  Nasal mucosa mildly edematous, erythematous L>R.  OP clear.  Tender over sinuses x 4 Neck: mild anterior cervical lymphadenopathy, tender Heart: regular  rate and rhythm without murmur Lungs: clear bilaterally, no wheezes, rales or ronchi Skin: no rash Neuro: alert and oriented, cranial nerves intact Psych: normal mood, affect, hygiene and grooming  ASSESSMENT/PLAN:  Sinusitis, acute - Plan: azithromycin (ZITHROMAX) 250 MG tablet  Guaifenesin, sinus rinses, decongestants (cautiously--monitor BP). Call/follow-up if not resolving as instructed.

## 2013-03-01 NOTE — Patient Instructions (Addendum)
Take the antibiotics as directed.  They stay working in system for 10 days.  If you are WORSE after 5 days, rather than improving, then call to have medication changed.  If you are improving, then continue to monitor for another 5 days.  Call us on day 10 if you haven't gotten completely better.  You may need a refill if you are improving, but not 100% yet.    Drink plenty of fluids.  Use guaifenesin (robitussin or Mucinex products), and try sinus rinses (either sinus rinse kit or neti-pot) once or twice daily.  You may continue to use decongestants, but keep an eye on your blood pressure.

## 2013-03-10 ENCOUNTER — Telehealth: Payer: Self-pay | Admitting: Family Medicine

## 2013-03-10 DIAGNOSIS — J019 Acute sinusitis, unspecified: Secondary | ICD-10-CM

## 2013-03-10 MED ORDER — AZITHROMYCIN 250 MG PO TABS: ORAL_TABLET | ORAL | Status: DC

## 2013-03-10 NOTE — Telephone Encounter (Signed)
Spoke with patient and she did feel somewhat better after zpak course. I will call in another and she will start tomorrow.

## 2013-03-10 NOTE — Telephone Encounter (Signed)
If she is somewhat improved after the z-pak, then please send refill to extend the course (to start on day 11)

## 2013-05-26 ENCOUNTER — Encounter: Payer: Self-pay | Admitting: Family Medicine

## 2013-05-26 ENCOUNTER — Ambulatory Visit (INDEPENDENT_AMBULATORY_CARE_PROVIDER_SITE_OTHER): Payer: BC Managed Care – PPO | Admitting: Family Medicine

## 2013-05-26 VITALS — BP 126/80 | HR 68 | Ht 64.25 in | Wt 182.0 lb

## 2013-05-26 DIAGNOSIS — Z0000 Encounter for general adult medical examination without abnormal findings: Secondary | ICD-10-CM

## 2013-05-26 DIAGNOSIS — E039 Hypothyroidism, unspecified: Secondary | ICD-10-CM

## 2013-05-26 DIAGNOSIS — M7071 Other bursitis of hip, right hip: Secondary | ICD-10-CM

## 2013-05-26 DIAGNOSIS — K219 Gastro-esophageal reflux disease without esophagitis: Secondary | ICD-10-CM

## 2013-05-26 DIAGNOSIS — M76899 Other specified enthesopathies of unspecified lower limb, excluding foot: Secondary | ICD-10-CM

## 2013-05-26 DIAGNOSIS — E7800 Pure hypercholesterolemia, unspecified: Secondary | ICD-10-CM

## 2013-05-26 DIAGNOSIS — E669 Obesity, unspecified: Secondary | ICD-10-CM | POA: Insufficient documentation

## 2013-05-26 LAB — POCT URINALYSIS DIPSTICK
Glucose, UA: NEGATIVE
Nitrite, UA: NEGATIVE
Urobilinogen, UA: NEGATIVE

## 2013-05-26 MED ORDER — NAPROXEN 500 MG PO TABS: 500.0000 mg | ORAL_TABLET | Freq: Two times a day (BID) | ORAL | Status: DC

## 2013-05-26 MED ORDER — ESOMEPRAZOLE MAGNESIUM 40 MG PO CPDR: 40.0000 mg | DELAYED_RELEASE_CAPSULE | Freq: Every day | ORAL | Status: DC

## 2013-05-26 NOTE — Progress Notes (Signed)
Chief Complaint  Patient presents with  . Annual Exam    fasting annual exam no gyn, sees Dr. Stann Mainland. Did not do eye exam as she just recently had one with her eye doctor.    Mckenzie Webb is a 67 y.o. female who presents for a complete physical.  She has the following concerns:  She had bloodwork done by nurse at work when she was seen for right lateral hip pain.  Hurts to lie down on right side at night. Also has some LBP.  Labs done 9/29 include normal CBC.  TSH was 0.662, RF 9.7 (normal), ANA negative and ESR 9.  Hypothyroidism-- Recent normal TSH (last week) through work. Denies any changes in hair, skin, or bowels.  +weight gain.  Skin and hair are always dry, unchanged.  Psoriasis--doing well. Has refills on temovate cream. Mostly involves her hips/low back, occasionally at her elbows.   Hyperlipidemia: She continues to take red yeast rice, but only taking 1 fish oil daily (previously was on Lovaza).  She believes she will be having lipids checked at the end of the month at work.  GERD--having heartburn more frequently, even from just drinking water.  Burns up through her chest.  Occuring almost daily for the last several months.  She has been off medications for quite a while, using Tums frequently.  Denies any change in diet.  Immunization History  Administered Date(s) Administered  . Influenza Split 06/20/2011, 06/19/2012  . Influenza Whole 05/19/2000, 05/19/2001  . Pneumococcal Conjugate 06/16/2000  . Td 04/13/2001  . Tdap 12/31/2010   Last Pap smear: UTD through GYN  Last mammogram: 12/2012  Last colonoscopy: 3/09  Last DEXA: 11/2009, normal  Dentist: every 6 months  Ophtho: regularly, went about 2 months ago Exercise: walks a little at work (not as much as before) and then [employer] in August (pulled down by dog, chasing a deer in her yard).  She scraped up knees and hit her front of face. She saw ENT. No other falls.    Past Medical History  Diagnosis Date  .  Psoriasis   . GERD (gastroesophageal reflux disease)   . Allergy   . Interstitial cystitis   . Leukopenia   . Edema   . Hyperlipidemia   . Diabetes mellitus 4/07    pt reports that all subsequent tests were normal, not diabetic  . Obesity   . DJD (degenerative joint disease), cervical 04 2002  . Hypothyroid     Past Surgical History  Procedure Laterality Date  . Cholecystectomy    . Tubal ligation  1983  . Knee arthroscopy  1990    right knee  . Colonoscopy  3/09  . Varicose vein surgery  09/2009 R, 06/2010 L  . Knee arthroscopy  12/12/2010    Left knee (Dr. Noemi Chapel)    History   Social History  . Marital Status: Married    Spouse Name: N/A    Number of Children: 2  . 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 Smoking status: Never Smoker   . Smokeless tobacco: Never Used  . Alcohol Use: Yes     Comment: social, 1-2 times per year  . Drug Use: No  . Sexual Activity: Yes    Partners: Male   Other Topics Concern  . Not on file   Social History Narrative   Lives with husband, no pets.  Children in [place] and [place].  1 granddaughter    Family History  Problem Relation Age of Onset  . Hypertension Mother   . Heart disease Mother   . Gallbladder disease Mother   . Thyroid disease Mother   . Arthritis Mother   . Diabetes Father   . Cancer Father     pancreatic  . Hypertension Sister   . Gallbladder disease Sister   . Diabetes Sister   . Cancer Son     testicular cancer  . Stroke Sister     late 49's  . Hypertension Sister   . Pulmonary embolism Sister   . Ulcerative colitis Sister   . Breast cancer Neg Hx   . Colon cancer Neg Hx     Current outpatient prescriptions:acetaminophen (TYLENOL) 500 MG tablet, Take 500 mg by mouth every 6 (six) hours as needed for pain., Disp: , Rfl: ;  Calcium-Vitamin D (CALTRATE 600 PLUS-VIT D PO), Take 1 tablet by mouth daily.  , Disp: , Rfl:  ;  Cholecalciferol (VITAMIN D) 1000 UNITS capsule, Take 1,000 Units by mouth daily.  , Disp: , Rfl: ;  clobetasol cream (TEMOVATE) 0.05 %, Apply topically 2 (two) times daily., Disp: 135 g, Rfl: 2 co-enzyme Q-10 30 MG capsule, Take 100 mg by mouth daily.  , Disp: , Rfl: ;  Cyanocobalamin (VITAMIN B 12 PO), Take 1 tablet by mouth.  , Disp: , Rfl: ;  fish oil-omega-3 fatty acids 1000 MG capsule, Take 1.2 g by mouth daily., Disp: , Rfl: ;  levothyroxine (SYNTHROID, LEVOTHROID) 88 MCG tablet, Take 88 mcg by mouth daily., Disp: , Rfl: ;  Red Yeast Rice Extract (RED YEAST RICE PO), Take 4 tablets by mouth daily.  , Disp: , Rfl:  esomeprazole (NEXIUM) 40 MG capsule, Take 1 capsule (40 mg total) by mouth daily., Disp: 30 capsule, Rfl: 2;  naproxen (NAPROSYN) 500 MG tablet, Take 1 tablet (500 mg total) by mouth 2 (two) times daily with a meal., Disp: 30 tablet, Rfl: 0 (Nexium started today, NOT taking prior to visit).  Allergies  Allergen Reactions  . Codeine Nausea Only  . Iodine Swelling  . Polysporin [Bacitracin-Polymyxin B] Swelling    Used for eye infection--got redder and worse  . Sulfa Antibiotics Swelling    Lips swell, itching  . Penicillins Rash   ROS: The patient denies anorexia, fever, headaches, vision changes, decreased hearing, ear pain, sore throat, breast concerns, chest pain, palpitations, dizziness, syncope, dyspnea on exertion, cough, swelling, nausea, vomiting, diarrhea, abdominal pain, melena, hematochezia, hematuria, incontinence, dysuria, vaginal bleeding, discharge, odor or itch, genital lesions, numbness, tingling, weakness, tremor, suspicious skin lesions, depression, anxiety, abnormal bleeding/bruising, or enlarged lymph nodes. No memory concerns. +constipation and hemorrhoids +weight gain +urinary frequency is improved, only up once at night to void Low back and right hip pain.  Some posterior headaches +heartburn  PHYSICAL EXAM:  BP 126/80  Pulse 68  Ht 5' 4.25" (1.632  m)  Wt 182 lb (82.555 kg)  BMI 31 kg/m2  General Appearance:  Alert, cooperative, no distress, appears stated age   Head:  Normocephalic, without obvious abnormality, atraumatic   Eyes:  PERRL, conjunctiva/corneas clear, EOM's intact, fundi  benign   Ears:  Normal TM's and external ear canals   Nose:  Nares normal, mucosa normal, no drainage or sinus tenderness   Throat:  Lips, mucosa, and tongue normal; teeth and gums normal   Neck:  Supple, no lymphadenopathy; thyroid: no enlargement/tenderness/nodules; no carotid  bruit  or JVD   Back:  Spine nontender, no curvature, ROM normal, no CVA tenderness. Tender at bilateral SI joints.  nontender at sciatic notch, no muscle spasm  Lungs:  Clear to auscultation bilaterally without wheezes, rales or ronchi; respirations unlabored   Chest Wall:  No tenderness or deformity   Heart:  Regular rate and rhythm, S1 and S2 normal, no murmur, rub  or gallop   Breast Exam:  Deferred to GYN   Abdomen:  Soft, non-tender, nondistended, normoactive bowel sounds,  no masses, no hepatosplenomegaly   Genitalia:  Deferred to GYN      Extremities:  No clubbing, cyanosis or edema.  Tender at R trochanteric bursa  Pulses:  2+ and symmetric all extremities   Skin:  Skin color, texture, turgor normal, no rashes or lesions   Lymph nodes:  Cervical, supraclavicular, and axillary nodes normal   Neurologic:  CNII-XII intact, normal strength, sensation and gait; reflexes 2+ and symmetric throughout         Psych: Normal mood, affect, hygiene and grooming.   ASSESSMENT/PLAN:  Routine general medical examination at a health care facility - Plan: POCT Urinalysis Dipstick  Pure hypercholesterolemia - labs through work later this month. continue red yeast rice.  await labs, but likely recommend fish oil of 3000-4000 mg daily.  cont. low cholesterol diet.  Unspecified hypothyroidism - adequately replaced on current dose  Obesity (BMI 30-39.9) - weight loss  encouraged--reviewed diet and exercise at length. healthy diet, healthy snacks, portion control  GERD (gastroesophageal reflux disease) - diet reviewed.  weight loss encouraged.  restart Nexium 20m for 1-2 months, then start weaning of, if able. - Plan: esomeprazole (NEXIUM) 40 MG capsule  Hip bursitis, right - Plan: naproxen (NAPROSYN) 500 MG tablet  R trochanteric bursitis--risks/benefits of naprosyn reviewed.  Recommend oral NSAID given also having tenderness at bilateral SI joint.  If SI joint pain resolves with NSAIDs, but has persistent pain from bursitis, return for cortisone injection.  GERD--reviewed precautions, diet.  Encouraged weight loss.  Restart nexium 471m 1-2 months, then try tapering down to OTC dose, then down to just prn, if able.   She gets lipids, LFT's, sugar and A1c through work, and is scheduled to have that in late October.  Will send copies of results.  Discussed monthly self breast exams and yearly mammograms; at least 30 minutes of aerobic activity at least 5 days/week; proper sunscreen use reviewed; healthy diet, including goals of calcium and vitamin D intake and alcohol recommendations (less than or equal to 1 drink/day) reviewed; regular seatbelt use; changing batteries in smoke detectors.  Immunization recommendations discussed--UTD.  Colonoscopy recommendations reviewed   Prevnar next year (age 67 Set up [place] for 1-2 months for zostavax.  Declines today due to concern about her grandchild who is <1 15ear old (she will be 1 later this month; she will return for zostavax after she (granddaughter) has gotten her varicella injection, and at least a month after her flu shot)  F/u 6 months for med check, sooner prn.  (total visit time 55 mins)

## 2013-05-26 NOTE — Patient Instructions (Addendum)
HEALTH MAINTENANCE RECOMMENDATIONS:  It is recommended that you get at least 30 minutes of aerobic exercise at least 5 days/week (for weight loss, you may need as much as 60-90 minutes). This can be any activity that gets your heart rate up. This can be divided in 10-15 minute intervals if needed, but try and build up your endurance at least once a week.  Weight bearing exercise is also recommended twice weekly.  Eat a healthy diet with lots of vegetables, fruits and fiber.  "Colorful" foods have a lot of vitamins (ie green vegetables, tomatoes, red peppers, etc).  Limit sweet tea, regular sodas and alcoholic beverages, all of which has a lot of calories and sugar.  Up to 1 alcoholic drink daily may be beneficial for women (unless trying to lose weight, watch sugars).  Drink a lot of water.  Calcium recommendations are 1200-1500 mg daily (1500 mg for postmenopausal women or women without ovaries), and vitamin D 1000 IU daily.  This should be obtained from diet and/or supplements (vitamins), and calcium should not be taken all at once, but in divided doses.  Monthly self breast exams and yearly mammograms for women over the age of 85 is recommended.  Sunscreen of at least SPF 30 should be used on all sun-exposed parts of the skin when outside between the hours of 10 am and 4 pm (not just when at beach or pool, but even with exercise, golf, tennis, and yard work!)  Use a sunscreen that says "broad spectrum" so it covers both UVA and UVB rays, and make sure to reapply every 1-2 hours.  Remember to change the batteries in your smoke detectors when changing your clock times in the spring and fall.  Use your seat belt every time you are in a car, and please drive safely and not be distracted with cell phones and texting while driving.  Restart the Nexium daily for 1-2 months, then try tapering back to the 70m over-the-counter dose.  If no recurrent reflux at this dose, then you can try tapering back  further, using just as needed prior to meals known to trigger heartburn.  Losing weight will help get heartburn under better control.  Please start exercising more, as discussed; cut back on breads/carbs, look at portion control, and really try and get the weight down.  Please send uKoreacopies of your lab tests from later this month--we aren't doing any tests today, knowing you will have those tests that I want done through work in a few weeks, but I need to review the results.  Return in 1-2 months for shingles vaccine  Hip Bursitis Bursitis is a swelling and soreness (inflammation) of a fluid-filled sac (bursa). This sac overlies and protects the joints.  CAUSES   Injury.  Overuse of the muscles surrounding the joint.  Arthritis.  Gout.  Infection.  Cold weather.  Inadequate warm-up and conditioning prior to activities. The cause may not be known.  SYMPTOMS   Mild to severe irritation.  Tenderness and swelling over the outside of the hip.  Pain with motion of the hip.  If the bursa becomes infected, a fever may be present. Redness, tenderness, and warmth will develop over the hip. Symptoms usually lessen in 3 to 4 weeks with treatment, but can come back. TREATMENT If conservative treatment does not work, your caregiver may advise draining the bursa and injecting cortisone into the area. This may speed up the healing process. This may also be used as an initial  treatment of choice. HOME CARE INSTRUCTIONS   Apply ice to the affected area for 15-20 minutes every 3 to 4 hours while awake for the first 2 days. Put the ice in a plastic bag and place a towel between the bag of ice and your skin.  Rest the painful joint as much as possible, but continue to put the joint through a normal range of motion at least 4 times per day. When the pain lessens, begin normal, slow movements and usual activities to help prevent stiffness of the hip.  Only take over-the-counter or prescription  medicines for pain, discomfort, or fever as directed by your caregiver.  Use crutches to limit weight bearing on the hip joint, if advised.  Elevate your painful hip to reduce swelling. Use pillows for propping and cushioning your legs and hips.  Gentle massage may provide comfort and decrease swelling. SEEK IMMEDIATE MEDICAL CARE IF:   Your pain increases even during treatment, or you are not improving.  You have a fever.  You have heat and inflammation over the involved bursa.  You have any other questions or concerns. MAKE SURE YOU:   Understand these instructions.  Will watch your condition.  Will get help right away if you are not doing well or get worse. Document Released: 01/25/2002 Document Revised: 10/28/2011 Document Reviewed: 08/24/2008 [facility] Patient Information 2014 [place], [place].   Return for cortisone shot to your hip if pain doesn't improve with the oral anti-inflammatory medication.

## 2013-06-04 ENCOUNTER — Encounter: Payer: Self-pay | Admitting: Family Medicine

## 2013-07-14 ENCOUNTER — Telehealth: Payer: Self-pay | Admitting: *Deleted

## 2013-07-14 NOTE — Telephone Encounter (Signed)
Tried home number, no answer, cell number mailbox is not set up. Called work and left message on VM informing patient that Dr.Knapp did get her lab results and did review them. I let her know that blood sugar was a little borderline, lipids looked good-to continue on red yeast rice and that Dr.Knapp does encourage weightloss.

## 2013-07-26 ENCOUNTER — Other Ambulatory Visit: Payer: BC Managed Care – PPO

## 2013-07-26 ENCOUNTER — Other Ambulatory Visit (INDEPENDENT_AMBULATORY_CARE_PROVIDER_SITE_OTHER): Payer: BC Managed Care – PPO

## 2013-07-26 DIAGNOSIS — Z23 Encounter for immunization: Secondary | ICD-10-CM

## 2013-07-26 DIAGNOSIS — Z2911 Encounter for prophylactic immunotherapy for respiratory syncytial virus (RSV): Secondary | ICD-10-CM

## 2013-07-28 ENCOUNTER — Ambulatory Visit (INDEPENDENT_AMBULATORY_CARE_PROVIDER_SITE_OTHER): Payer: BC Managed Care – PPO | Admitting: Family Medicine

## 2013-07-28 ENCOUNTER — Encounter: Payer: Self-pay | Admitting: Family Medicine

## 2013-07-28 VITALS — BP 102/64 | HR 72 | Temp 99.8°F | Ht 65.0 in | Wt 175.0 lb

## 2013-07-28 DIAGNOSIS — R197 Diarrhea, unspecified: Secondary | ICD-10-CM

## 2013-07-28 DIAGNOSIS — R52 Pain, unspecified: Secondary | ICD-10-CM

## 2013-07-28 DIAGNOSIS — R509 Fever, unspecified: Secondary | ICD-10-CM

## 2013-07-28 DIAGNOSIS — T50Z95A Adverse effect of other vaccines and biological substances, initial encounter: Secondary | ICD-10-CM

## 2013-07-28 DIAGNOSIS — R6883 Chills (without fever): Secondary | ICD-10-CM

## 2013-07-28 LAB — POC INFLUENZA A&B (BINAX/QUICKVUE)
Influenza A, POC: NEGATIVE
Influenza B, POC: NEGATIVE

## 2013-07-28 NOTE — Progress Notes (Signed)
Chief Complaint  Patient presents with  . Fever    came in Monday at lunch to our office and got shingles and prevnar. Felt fine up until bedime. Had severe chills, was "freezing.' All day yesterday was cold all day long and had body aches. Her injection site is red and warm to the touch. Unsure if she may have the flu or if this could be a reaction to one of the vaccines.    2 days ago she had the two vaccines, both given in the left deltoid area.  She reports that the Prevnar was given anteriorly, the zostavax posteriorly.  Yesterday she was in bed all day, couldn't get warm.  Last night she was hot.  This morning she had a touch of diarrhea when she got up to go to work.  She threw up some phlegm yesterday.  Currently denies any nausea.  Two loose stools this morning, nonbloody.  Denies abdominal pain.  She has felt warm, but hasn't checked temperature.   Her arm is tender, swollen and hot where she got the vaccine.  She was told by [facility] that she needed pneumonia vaccine, so she got this when she came for her shingles vaccine earlier this week.    She has a cough, lingering from a URI about 6 weeks ago.  Cough is very mild, no significant phlegm, usually dry and infrequent spells of coughing.  Past Medical History  Diagnosis Date  . Psoriasis   . GERD (gastroesophageal reflux disease)   . Allergy   . Interstitial cystitis   . Leukopenia   . Edema   . Hyperlipidemia   . Diabetes mellitus 4/07    pt reports that all subsequent tests were normal, not diabetic  . Obesity   . DJD (degenerative joint disease), cervical 04 2002  . Hypothyroid    Past Surgical History  Procedure Laterality Date  . Cholecystectomy    . Tubal ligation  1983  . Knee arthroscopy  1990    right knee  . Colonoscopy  3/09  . Varicose vein surgery  09/2009 R, 06/2010 L  . Knee arthroscopy  12/12/2010    Left knee (Dr. Noemi Chapel)   History   Social History  . Marital Status: Married    Spouse Name: N/A    Number of Children: 2  . 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 Smoking status: Never Smoker   . Smokeless tobacco: Never Used  . Alcohol Use: Yes     Comment: social, 1-2 times per year  . Drug Use: No  . Sexual Activity: Yes    Partners: Male   Other Topics Concern  . Not on file   Social History Narrative   Lives with husband, no pets.  Children in [place] and [place].  1 granddaughter    Current outpatient prescriptions:acetaminophen (TYLENOL) 500 MG tablet, Take 500 mg by mouth every 6 (six) hours as needed for pain., Disp: , Rfl: ;  Calcium-Vitamin D (CALTRATE 600 PLUS-VIT D PO), Take 1 tablet by mouth daily.  , Disp: , Rfl: ;  Cholecalciferol (VITAMIN D) 1000 UNITS capsule, Take 1,000 Units by mouth daily.  , Disp: , Rfl: ;  co-enzyme Q-10 30 MG capsule, Take 100 mg by mouth daily.  , Disp: , Rfl:  Cyanocobalamin (VITAMIN B 12 PO), Take 1 tablet by mouth.  , Disp: , Rfl: ;  esomeprazole (  NEXIUM) 40 MG capsule, Take 1 capsule (40 mg total) by mouth daily., Disp: 30 capsule, Rfl: 2;  fish oil-omega-3 fatty acids 1000 MG capsule, Take 1.2 g by mouth daily., Disp: , Rfl: ;  levothyroxine (SYNTHROID, LEVOTHROID) 88 MCG tablet, Take 88 mcg by mouth daily., Disp: , Rfl:  naproxen (NAPROSYN) 500 MG tablet, Take 1 tablet (500 mg total) by mouth 2 (two) times daily with a meal., Disp: 30 tablet, Rfl: 0;  Red Yeast Rice Extract (RED YEAST RICE PO), Take 4 tablets by mouth daily.  , Disp: , Rfl: ;  clobetasol cream (TEMOVATE) 0.05 %, Apply topically 2 (two) times daily., Disp: 135 g, Rfl: 2  Allergies  Allergen Reactions  . Codeine Nausea Only  . Iodine Swelling  . Polysporin [Bacitracin-Polymyxin B] Swelling    Used for eye infection--got redder and worse  . Sulfa Antibiotics Swelling    Lips swell, itching  . Penicillins Rash   ROS:  Denies sinus pain, purulent nasal drainage, sore throat.  +cough,  +vomiting, diarrhea, no current nausea, no abdominal pain, urinary complaints, bleeding, bruising, rashes (other than concern at LUE).  +subjective fevers, chills  PHYSICAL EXAM: BP 102/64  Pulse 72  Temp(Src) 99.8 F (37.7 C) (Oral)  Ht 5' 5"  (1.651 m)  Wt 175 lb (79.379 kg)  BMI 29.12 kg/m2 Pleasant female in no distress HEENT:  PERRL, EOMI, conjunctiva clear.  OP clear.  Sinuses nontender Neck: no lymphadenopathy or mass Heart: regular rate and rhythm without murmur Lungs: clear bilaterally Left upper arm in deltoid region there is a 9 x 7 cm oval/round area of erythema that is tender to touch.  There are no streaks. The erythematous area is mildly swollen/raised and mildly indurated.  There is no fluctuance Abdomen: nontender, soft  ASSESSMENT/PLAN:  Vaccine reaction, initial encounter - local reaction in area of Prevnar (same arm as zostavax given, but rxn is more anterior where prevnar given)  Fever, unspecified - Plan: POC Influenza A&B (Binax test)  Chills - Plan: POC Influenza A&B (Binax test)  Body aches - Plan: POC Influenza A&B (Binax test)  Diarrhea - cannot r/o concurrent viral illness  Local reaction to vaccine in L upper arm Diarrhea--reaction vs viral infection Supportive measures reviewed--for LUE cool compresses, antihistamines.  For diarrea--clear fluids, BRAT diet, avoid dairy S/sx cellulitis reviewed--return if worsening.

## 2013-07-28 NOTE — Patient Instructions (Addendum)
Use tylenol and/or ibuprofen as needed for pain and fever. Cool compresses to the left upper arm. Observe for increasing redness, streaking, persistent/worsening fevers  The diarrhea could potentially be due to a virus.  Drink clear liquids, bland diet, avoid dairy.  Okay to use imodium as needed. Return for re-evaluation if you develop significant abdominal pain, bloody stools, or other problems.  Diet for Diarrhea, Adult Frequent, runny stools (diarrhea) may be caused or worsened by food or drink. Diarrhea may be relieved by changing your diet. Since diarrhea can last up to 7 days, it is easy for you to lose too much fluid from the body and become dehydrated. Fluids that are lost need to be replaced. Along with a modified diet, make sure you drink enough fluids to keep your urine clear or pale yellow. DIET INSTRUCTIONS  Ensure adequate fluid intake (hydration): have 1 cup (8 oz) of fluid for each diarrhea episode. Avoid fluids that contain simple sugars or sports drinks, fruit juices, whole milk products, and sodas. Your urine should be clear or pale yellow if you are drinking enough fluids. Hydrate with an oral rehydration solution that you can purchase at pharmacies, retail stores, and online. You can prepare an oral rehydration solution at home by mixing the following ingredients together:    tsp table salt.   tsp baking soda.   tsp salt substitute containing potassium chloride.  1  tablespoons sugar.  1 L (34 oz) of water.  Certain foods and beverages may increase the speed at which food moves through the gastrointestinal (GI) tract. These foods and beverages should be avoided and include:  Caffeinated and alcoholic beverages.  High-fiber foods, such as raw fruits and vegetables, nuts, seeds, and whole grain breads and cereals.  Foods and beverages sweetened with sugar alcohols, such as xylitol, sorbitol, and mannitol.  Some foods may be well tolerated and may help thicken stool  including:  Starchy foods, such as rice, toast, pasta, low-sugar cereal, oatmeal, grits, baked potatoes, crackers, and bagels.   Bananas.   Applesauce.  Add probiotic-rich foods to help increase healthy bacteria in the GI tract, such as yogurt and fermented milk products. RECOMMENDED FOODS AND BEVERAGES Starches Choose foods with less than 2 g of fiber per serving.  Recommended:  White, [place], and pita breads, plain rolls, buns, bagels. Plain muffins, matzo. Soda, saltine, or graham crackers. Pretzels, melba toast, zwieback. Cooked cereals made with water: cornmeal, farina, cream cereals. Dry cereals: refined corn, wheat, rice. Potatoes prepared any way without skins, refined macaroni, spaghetti, noodles, refined rice.  Avoid:  Bread, rolls, or crackers made with whole wheat, multi-grains, rye, bran seeds, nuts, or coconut. Corn tortillas or taco shells. Cereals containing whole grains, multi-grains, bran, coconut, nuts, raisins. Cooked or dry oatmeal. Coarse wheat cereals, granola. Cereals advertised as "high-fiber." Potato skins. Whole grain pasta, wild or brown rice. Popcorn. Sweet potatoes, yams. Sweet rolls, doughnuts, waffles, pancakes, sweet breads. Vegetables  Recommended: Strained tomato and vegetable juices. Most well-cooked and canned vegetables without seeds. Fresh: Tender lettuce, cucumber without the skin, cabbage, spinach, bean sprouts.  Avoid: Fresh, cooked, or canned: Artichokes, baked beans, beet greens, broccoli, Brussels sprouts, corn, kale, legumes, peas, sweet potatoes. Cooked: Green or red cabbage, spinach. Avoid large servings of any vegetables because vegetables shrink when cooked, and they contain more fiber per serving than fresh vegetables. Fruit  Recommended: Cooked or canned: Apricots, applesauce, cantaloupe, cherries, fruit cocktail, grapefruit, grapes, kiwi, mandarin oranges, peaches, pears, plums, watermelon. Fresh: Apples without skin,  ripe banana, grapes,  cantaloupe, cherries, grapefruit, peaches, oranges, plums. Keep servings limited to  cup or 1 piece.  Avoid: Fresh: Apples with skin, apricots, mangoes, pears, raspberries, strawberries. Prune juice, stewed or dried prunes. Dried fruits, raisins, dates. Large servings of all fresh fruits. Protein  Recommended: Ground or well-cooked tender beef, ham, veal, lamb, pork, or poultry. Eggs. Fish, oysters, shrimp, lobster, other seafoods. Liver, organ meats.  Avoid: Tough, fibrous meats with gristle. Peanut butter, smooth or chunky. Cheese, nuts, seeds, legumes, dried peas, beans, lentils. Dairy  Recommended: Yogurt, lactose-free milk, kefir, drinkable yogurt, buttermilk, soy milk, or plain hard cheese.  Avoid: Milk, chocolate milk, beverages made with milk, such as milkshakes. Soups  Recommended: Bouillon, broth, or soups made from allowed foods. Any strained soup.  Avoid: Soups made from vegetables that are not allowed, cream or milk-based soups. Desserts and Sweets  Recommended: Sugar-free gelatin, sugar-free frozen ice pops made without sugar alcohol.  Avoid: Plain cakes and cookies, pie made with fruit, pudding, custard, cream pie. Gelatin, fruit, ice, sherbet, frozen ice pops. Ice cream, ice milk without nuts. Plain hard candy, honey, jelly, molasses, syrup, sugar, chocolate syrup, gumdrops, marshmallows. Fats and Oils  Recommended: Limit fats to less than 8 tsp per day.  Avoid: Seeds, nuts, olives, avocados. Margarine, butter, cream, mayonnaise, salad oils, plain salad dressings. Plain gravy, crisp bacon without rind. Beverages  Recommended: Water, decaffeinated teas, oral rehydration solutions, sugar-free beverages not sweetened with sugar alcohols.  Avoid: Fruit juices, caffeinated beverages (coffee, tea, soda), alcohol, sports drinks, or lemon-lime soda. Condiments  Recommended: Ketchup, mustard, horseradish, vinegar, cocoa powder. Spices in moderation: allspice, basil, bay  leaves, celery powder or leaves, cinnamon, cumin powder, curry powder, ginger, mace, marjoram, onion or garlic powder, oregano, paprika, parsley flakes, ground pepper, rosemary, sage, savory, tarragon, thyme, turmeric.  Avoid: Coconut, honey. Document Released: 10/26/2003 Document Revised: 04/29/2012 Document Reviewed: 12/20/2011 [facility] Patient Information 2014 [place].

## 2013-07-29 ENCOUNTER — Encounter: Payer: Self-pay | Admitting: Family Medicine

## 2013-08-26 ENCOUNTER — Other Ambulatory Visit: Payer: Self-pay | Admitting: Family Medicine

## 2013-11-25 ENCOUNTER — Other Ambulatory Visit: Payer: Self-pay

## 2013-11-25 ENCOUNTER — Ambulatory Visit (INDEPENDENT_AMBULATORY_CARE_PROVIDER_SITE_OTHER): Payer: BC Managed Care – PPO | Admitting: Family Medicine

## 2013-11-25 ENCOUNTER — Encounter: Payer: Self-pay | Admitting: Family Medicine

## 2013-11-25 VITALS — BP 130/74 | HR 60 | Ht 65.0 in | Wt 175.0 lb

## 2013-11-25 DIAGNOSIS — K219 Gastro-esophageal reflux disease without esophagitis: Secondary | ICD-10-CM | POA: Diagnosis not present

## 2013-11-25 DIAGNOSIS — M7061 Trochanteric bursitis, right hip: Secondary | ICD-10-CM

## 2013-11-25 DIAGNOSIS — E7800 Pure hypercholesterolemia, unspecified: Secondary | ICD-10-CM

## 2013-11-25 DIAGNOSIS — R300 Dysuria: Secondary | ICD-10-CM | POA: Diagnosis not present

## 2013-11-25 DIAGNOSIS — Z1231 Encounter for screening mammogram for malignant neoplasm of breast: Secondary | ICD-10-CM

## 2013-11-25 DIAGNOSIS — R309 Painful micturition, unspecified: Secondary | ICD-10-CM

## 2013-11-25 DIAGNOSIS — R238 Other skin changes: Secondary | ICD-10-CM

## 2013-11-25 DIAGNOSIS — Z79899 Other long term (current) drug therapy: Secondary | ICD-10-CM

## 2013-11-25 DIAGNOSIS — M76899 Other specified enthesopathies of unspecified lower limb, excluding foot: Secondary | ICD-10-CM

## 2013-11-25 DIAGNOSIS — E039 Hypothyroidism, unspecified: Secondary | ICD-10-CM | POA: Diagnosis not present

## 2013-11-25 DIAGNOSIS — R233 Spontaneous ecchymoses: Secondary | ICD-10-CM

## 2013-11-25 LAB — POCT URINALYSIS DIPSTICK
Bilirubin, UA: NEGATIVE
Glucose, UA: NEGATIVE
Ketones, UA: NEGATIVE
NITRITE UA: NEGATIVE
PH UA: 5
PROTEIN UA: NEGATIVE
Spec Grav, UA: 1.015
Urobilinogen, UA: NEGATIVE

## 2013-11-25 LAB — CBC WITH DIFFERENTIAL/PLATELET
Basophils Absolute: 0 10*3/uL (ref 0.0–0.1)
Basophils Relative: 1 % (ref 0–1)
EOS PCT: 3 % (ref 0–5)
Eosinophils Absolute: 0.1 10*3/uL (ref 0.0–0.7)
HEMATOCRIT: 38.4 % (ref 36.0–46.0)
Hemoglobin: 13.3 g/dL (ref 12.0–15.0)
Lymphocytes Relative: 38 % (ref 12–46)
Lymphs Abs: 1 10*3/uL (ref 0.7–4.0)
MCH: 30.4 pg (ref 26.0–34.0)
MCHC: 34.6 g/dL (ref 30.0–36.0)
MCV: 87.7 fL (ref 78.0–100.0)
Monocytes Absolute: 0.4 10*3/uL (ref 0.1–1.0)
Monocytes Relative: 13 % — ABNORMAL HIGH (ref 3–12)
Neutro Abs: 1.2 10*3/uL — ABNORMAL LOW (ref 1.7–7.7)
Neutrophils Relative %: 45 % (ref 43–77)
Platelets: 157 10*3/uL (ref 150–400)
RBC: 4.38 MIL/uL (ref 3.87–5.11)
RDW: 14 % (ref 11.5–15.5)
WBC: 2.7 10*3/uL — AB (ref 4.0–10.5)

## 2013-11-25 LAB — COMPREHENSIVE METABOLIC PANEL
ALT: 18 U/L (ref 0–35)
AST: 20 U/L (ref 0–37)
Albumin: 3.8 g/dL (ref 3.5–5.2)
Alkaline Phosphatase: 63 U/L (ref 39–117)
BUN: 12 mg/dL (ref 6–23)
CO2: 27 meq/L (ref 19–32)
Calcium: 9.4 mg/dL (ref 8.4–10.5)
Chloride: 106 mEq/L (ref 96–112)
Creat: 0.77 mg/dL (ref 0.50–1.10)
GLUCOSE: 93 mg/dL (ref 70–99)
Potassium: 4.1 mEq/L (ref 3.5–5.3)
Sodium: 141 mEq/L (ref 135–145)
Total Bilirubin: 0.6 mg/dL (ref 0.2–1.2)
Total Protein: 6.5 g/dL (ref 6.0–8.3)

## 2013-11-25 NOTE — Patient Instructions (Signed)
Continue current medications. Consider trial of every other day dosing of Nexium.  If having symptoms on days not taken, consider trying 92m OTC Nexium.  If symptoms are not as well controlled on OTC dose, then resume 440mdaily of rx Nexium.  Consider returning for cortisone shot if desired.  Use aleve or tylenol as needed for low back pain.

## 2013-11-25 NOTE — Progress Notes (Signed)
Chief Complaint  Patient presents with  . Hypothyroidism    6 month nonfasting med check. Patient would like to know why she is bruising so easily on her arms.   . Urinary Tract Infection    patient states she has some pressure/pain when her bladder is full.    She is complaining of some pressure in her lower abdomen, over her bladder, like it is full. Symptoms ongoing for "a good while", months.  Denies urinary urgency, frequency.  Denies any cloudiness or odor (there may have been odor at one time, resolved).  Denies fevers, flank pain.  She is also complaining of easy bruising for 1-2 years.  She can just barely scrape her arms, and a bruise will last for weeks.  She states this occurred even prior to taking Aleve or NSAIDs.  She doesn't believe CBC has been checked through work.  Hypothyroidism--TSH checked in November at work, and was 0.867.  Labs are done yearly in November. Denies any changes in hair, skin, or bowels. No changes in weight. Skin and hair are always dry, unchanged.   Psoriasis--doing well. Doesn't need refills yet, will call when needed.  Hyperlipidemia: She continues to take red yeast rice, and 1 fish oil capsule daily (same as what she was taking when labs last checked at work in November; previously was on [place]). Results from November were: total cholesterol 216, HDL 79, LDL 120, TG 83.  She had normal chem panel with glucose of 100 and A1c of 5.7   GERD--At her last visit 6 months ago she was having daily symptoms, not on any medication.  She was started on Nexium 40 mg at that time, told to take rx dose for 1-2 months, then try and taper to OTC dose.  She took the prescription for 3 months.  Because symptoms resolved, she decided to stop the medication completely, but had recurrent symptoms and called for refill.  She never tried the OTC dose of Nexium.  R trochanteric bursitis and bilateral SI tenderness in 05/2013, treated with naproxen.  Naproxen helped, but pain  recurred.  She has recurrent right lateral hip pain (hurts to lie on that side) as well as lower back pain.  She has been taking Aleve 1 pill twice daily, and this seems to help, but pain doesn't go away.  Past Medical History  Diagnosis Date  . Psoriasis   . GERD (gastroesophageal reflux disease)   . Allergy   . Interstitial cystitis   . Leukopenia   . Edema   . Hyperlipidemia   . Diabetes mellitus 4/07    pt reports that all subsequent tests were normal, not diabetic  . Obesity   . DJD (degenerative joint disease), cervical 04 2002  . Hypothyroid    Past Surgical History  Procedure Laterality Date  . Cholecystectomy    . Tubal ligation  1983  . Knee arthroscopy  1990    right knee  . Colonoscopy  3/09  . Varicose vein surgery  09/2009 R, 06/2010 L  . Knee arthroscopy  12/12/2010    Left knee (Dr. Noemi Chapel)   History   Social History  . Marital Status: Married    Spouse Name: N/A    Number of Children: 2  . 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 Smoking status: Never Smoker   . Smokeless tobacco: Never Used  . Alcohol Use: Yes  Comment: social, 1-2 times per year  . Drug Use: No  . Sexual Activity: Yes    Partners: Male   Other Topics Concern  . Not on file   Social History Narrative   Lives with husband, no pets.  Children in [place] and [place].  1 granddaughter   Outpatient Encounter Prescriptions as of 11/25/2013  Medication Sig Note  . acetaminophen (TYLENOL) 500 MG tablet Take 500 mg by mouth every 6 (six) hours as needed for pain.   . Calcium-Vitamin D (CALTRATE 600 PLUS-VIT D PO) Take 1 tablet by mouth daily.   05/26/2013: Takes at least 4 days/week  . Cholecalciferol (VITAMIN D) 1000 UNITS capsule Take 1,000 Units by mouth daily.     Marland Kitchen co-enzyme Q-10 30 MG capsule Take 100 mg by mouth daily.     . Cyanocobalamin (VITAMIN B 12 PO) Take 1 tablet by mouth.     . fish  oil-omega-3 fatty acids 1000 MG capsule Take 1.2 g by mouth daily.   Marland Kitchen levothyroxine (SYNTHROID, LEVOTHROID) 88 MCG tablet Take 88 mcg by mouth daily.   Marland Kitchen NEXIUM 40 MG capsule TAKE ONE CAPSULE BY MOUTH DAILY   . Red Yeast Rice Extract (RED YEAST RICE PO) Take 4 tablets by mouth daily.     . clobetasol cream (TEMOVATE) 0.05 % Apply topically 2 (two) times daily.   . naproxen (NAPROSYN) 500 MG tablet Take 1 tablet (500 mg total) by mouth 2 (two) times daily with a meal.    Allergies  Allergen Reactions  . Codeine Nausea Only  . Iodine Swelling  . Polysporin [Bacitracin-Polymyxin B] Swelling    Used for eye infection--got redder and worse  . Sulfa Antibiotics Swelling    Lips swell, itching  . Penicillins Rash   ROS: Denies fevers, chills, flank pain. She has some low back pain and right lateral hip pain.  No URI or allergy symptoms, cough, shortness of breath, chest pain, nausea, vomiting, normal bowels.  Denies vaginal discharge.  +bruising.  Denies rash, depression, or other complaints.  See HPI  PHYSICAL EXAM: BP 130/74  Pulse 60  Ht _0  (1.651 m)  Wt 175 lb (79.379 kg)  BMI 29.12 kg/m2 Well developed, pleasant female in no distress HEENT:  PERRL, EOMI, conjunctiva clear Neck: no lymphadenopathy, thyromegaly or carotid bruit Heart: regular rate and rhythm Lungs: clear bilaterally Abdomen: soft, nontender, no organomegaly or mass, no suprapubic tenderness Back: no CVA tenderness.  No spinal tenderness.  Mild discomfort at SI joints; no muscle spasm Extremities: no edema, normal pulses.  +TTP at R trochanter Psych: normal mood, affect, hygiene and grooming Neuro: alert and oriented.  Cranial nerves intact.  Normal strength, gait, sensation  ASSESSMENT/PLAN:  GERD (gastroesophageal reflux disease) - consider taper down to OTC (3m) daily, vs trial of [id number]  Urinary pain - send urine for culture.  Await results for treatment, given length and atypical features of symptoms -  Plan: POCT Urinalysis Dipstick, Urine culture  Unspecified hypothyroidism - euthyroid by history.  TSH to be checked through work in November, sooner if symptoms develop  Pure hypercholesterolemia - controlled on current regimen per last check in November.  Continue current supplements, monitor LFT's.  recheck lipids through work in November  Encounter for long-term (current) use of other medications - Plan: Comprehensive metabolic panel  Easy bruising - check CBC.  might be partly due to NSAIDs (pt states it started prior) - Plan: CBC with Differential  Trochanteric bursitis of right hip -  consider return for cortisone shot  nonfasting today. Check c-met today given chronic OTC meds of daily Aleve and Red Yeast rice.  Minimally impaired fasting glucose noted--continue efforts at weight loss and limiting sweets; daily exercise is recommended.  GERD:  Consider trial of every other day dosing.  If having symptoms on days not taken, consider trying 37m OTC Nexium.  If symptoms are not as well controlled on OTC dose, then resume 421mdaily of rx Nexium.   R trochanteric bursitis--consider returning for cortisone shot if desired.  Use aleve or tylenol as needed for low back pain.  Bladder discomfort--ongoing, not acute.  Check culture and treat if abnormal.  If normal, f/u with GYN as scheduled for pelvic exam  Schedule here CPE for 7 months, to be done in November AFTER her work labs are done.  Bring results to visit, or fax in advance

## 2013-11-27 LAB — URINE CULTURE: Colony Count: 50000

## 2013-11-29 ENCOUNTER — Other Ambulatory Visit: Payer: Self-pay | Admitting: *Deleted

## 2013-11-29 DIAGNOSIS — N390 Urinary tract infection, site not specified: Secondary | ICD-10-CM

## 2013-11-29 DIAGNOSIS — D72819 Decreased white blood cell count, unspecified: Secondary | ICD-10-CM

## 2013-11-29 MED ORDER — CIPROFLOXACIN HCL 250 MG PO TABS: 250.0000 mg | ORAL_TABLET | Freq: Two times a day (BID) | ORAL | Status: DC

## 2013-12-23 ENCOUNTER — Ambulatory Visit
Admission: RE | Admit: 2013-12-23 | Discharge: 2013-12-23 | Disposition: A | Discharge status: Home / Self Care | Payer: BC Managed Care – PPO | Source: Ambulatory Visit

## 2013-12-23 DIAGNOSIS — Z1231 Encounter for screening mammogram for malignant neoplasm of breast: Secondary | ICD-10-CM

## 2013-12-28 ENCOUNTER — Other Ambulatory Visit: Payer: BC Managed Care – PPO

## 2013-12-28 DIAGNOSIS — D72819 Decreased white blood cell count, unspecified: Secondary | ICD-10-CM

## 2013-12-28 LAB — CBC WITH DIFFERENTIAL/PLATELET
BASOS PCT: 1 % (ref 0–1)
Basophils Absolute: 0 10*3/uL (ref 0.0–0.1)
Eosinophils Absolute: 0.1 10*3/uL (ref 0.0–0.7)
Eosinophils Relative: 2 % (ref 0–5)
HEMATOCRIT: 39.6 % (ref 36.0–46.0)
Hemoglobin: 13.5 g/dL (ref 12.0–15.0)
LYMPHS ABS: 1.6 10*3/uL (ref 0.7–4.0)
Lymphocytes Relative: 42 % (ref 12–46)
MCH: 30.3 pg (ref 26.0–34.0)
MCHC: 34.1 g/dL (ref 30.0–36.0)
MCV: 89 fL (ref 78.0–100.0)
MONO ABS: 0.5 10*3/uL (ref 0.1–1.0)
MONOS PCT: 13 % — AB (ref 3–12)
NEUTROS ABS: 1.6 10*3/uL — AB (ref 1.7–7.7)
Neutrophils Relative %: 42 % — ABNORMAL LOW (ref 43–77)
Platelets: 171 10*3/uL (ref 150–400)
RBC: 4.45 MIL/uL (ref 3.87–5.11)
RDW: 13.2 % (ref 11.5–15.5)
WBC: 3.8 10*3/uL — ABNORMAL LOW (ref 4.0–10.5)

## 2013-12-29 ENCOUNTER — Other Ambulatory Visit: Payer: Self-pay

## 2014-01-03 ENCOUNTER — Other Ambulatory Visit: Payer: Self-pay | Admitting: Family Medicine

## 2014-01-25 DIAGNOSIS — Z01419 Encounter for gynecological examination (general) (routine) without abnormal findings: Secondary | ICD-10-CM | POA: Diagnosis not present

## 2014-02-23 DIAGNOSIS — L290 Pruritus ani: Secondary | ICD-10-CM | POA: Diagnosis not present

## 2014-03-09 ENCOUNTER — Other Ambulatory Visit: Payer: Self-pay | Admitting: Family Medicine

## 2014-03-14 ENCOUNTER — Encounter: Payer: Self-pay | Admitting: Family Medicine

## 2014-03-14 ENCOUNTER — Ambulatory Visit (INDEPENDENT_AMBULATORY_CARE_PROVIDER_SITE_OTHER): Payer: BC Managed Care – PPO | Admitting: Family Medicine

## 2014-03-14 VITALS — BP 130/70 | HR 76 | Temp 98.1°F | Ht 65.0 in | Wt 176.0 lb

## 2014-03-14 DIAGNOSIS — M76899 Other specified enthesopathies of unspecified lower limb, excluding foot: Secondary | ICD-10-CM | POA: Diagnosis not present

## 2014-03-14 DIAGNOSIS — M7061 Trochanteric bursitis, right hip: Secondary | ICD-10-CM | POA: Insufficient documentation

## 2014-03-14 DIAGNOSIS — M25559 Pain in unspecified hip: Secondary | ICD-10-CM | POA: Diagnosis not present

## 2014-03-14 DIAGNOSIS — R3915 Urgency of urination: Secondary | ICD-10-CM | POA: Diagnosis not present

## 2014-03-14 DIAGNOSIS — M25551 Pain in right hip: Secondary | ICD-10-CM

## 2014-03-14 LAB — POCT URINALYSIS DIPSTICK
BILIRUBIN UA: NEGATIVE
Glucose, UA: NEGATIVE
KETONES UA: NEGATIVE
LEUKOCYTES UA: NEGATIVE
Nitrite, UA: NEGATIVE
Protein, UA: NEGATIVE
RBC UA: NEGATIVE
Spec Grav, UA: 1.015
Urobilinogen, UA: NEGATIVE
pH, UA: 6

## 2014-03-14 MED ORDER — TRIAMCINOLONE ACETONIDE 40 MG/ML IJ SUSP
20.0000 mg | Freq: Once | INTRAMUSCULAR | Status: AC
  Administered 2014-03-14: 20 mg via INTRAMUSCULAR

## 2014-03-14 MED ORDER — BUPIVACAINE HCL 0.5 % IJ SOLN
1.0000 mL | Freq: Once | INTRAMUSCULAR | Status: AC
  Administered 2014-03-14: 1 mL

## 2014-03-14 MED ORDER — LIDOCAINE HCL 2 % IJ SOLN
1.0000 mL | Freq: Once | INTRAMUSCULAR | Status: AC
  Administered 2014-03-14: 20 mg via INTRADERMAL

## 2014-03-14 NOTE — Progress Notes (Signed)
Chief Complaint  Patient presents with  . Hip Pain    right sided hip pain, both are hurting her but right is worse than left presently. She would like to have injection. Also mentioned some urinary urgency that started this past weekend along with an odor.    R trochanteric bursitis and bilateral SI tenderness were noted at 05/2013 visit, treated with prescription naproxen. Naproxen helped, but pain recurred. She has ongoing right lateral hip pain (hurts to lie on that side) as well as lower back pain. She has been taking Aleve 1 pill twice daily, and this seems to help some, but pain doesn't go away.  She has never had cortisone shot for the bursitis, and would like one today. She previously had shots in the knee which were helpful. She understands that this will not help the pain in the low back.  She is also complaining of urinary urgency and odor to the urine, first noted over the weekend.  Denies abdominal pain and dysuria.  No blood in the urine. No fevers, chills, flank pain, nausea, vomiting  Past Medical History  Diagnosis Date  . Psoriasis   . GERD (gastroesophageal reflux disease)   . Allergy   . Interstitial cystitis   . Leukopenia   . Edema   . Hyperlipidemia   . Diabetes mellitus 4/07    pt reports that all subsequent tests were normal, not diabetic  . Obesity   . DJD (degenerative joint disease), cervical 04 2002  . Hypothyroid    Past Surgical History  Procedure Laterality Date  . Cholecystectomy    . Tubal ligation  1983  . Knee arthroscopy  1990    right knee  . Colonoscopy  3/09  . Varicose vein surgery  09/2009 R, 06/2010 L  . Knee arthroscopy  12/12/2010    Left knee (Dr. Noemi Chapel)   History   Social History  . Marital Status: Married    Spouse Name: N/A    Number of Children: 2  . 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 Smoking status: Never Smoker   . Smokeless tobacco:  Never Used  . Alcohol Use: Yes     Comment: social, 1-2 times per year  . Drug Use: No  . Sexual Activity: Yes    Partners: Male   Other Topics Concern  . Not on file   Social History Narrative   Lives with husband, no pets.  Children in [place] and [place].  1 granddaughter   Current Outpatient Prescriptions on File Prior to Visit  Medication Sig Dispense Refill  . Calcium-Vitamin D (CALTRATE 600 PLUS-VIT D PO) Take 1 tablet by mouth daily.        . Cholecalciferol (VITAMIN D) 1000 UNITS capsule Take 1,000 Units by mouth daily.        Marland Kitchen co-enzyme Q-10 30 MG capsule Take 100 mg by mouth daily.        . Cyanocobalamin (VITAMIN B 12 PO) Take 1 tablet by mouth.        . fish oil-omega-3 fatty acids 1000 MG capsule Take 1.2 g by mouth daily.      Marland Kitchen levothyroxine (SYNTHROID, LEVOTHROID) 88 MCG tablet Take 88 mcg by mouth daily.      Marland Kitchen NEXIUM 40 MG capsule TAKE ONE CAPSULE BY MOUTH DAILY  30 capsule  2  . Red Yeast Rice Extract (RED YEAST RICE PO)  Take 4 tablets by mouth daily.        . clobetasol cream (TEMOVATE) 0.05 % Apply topically 2 (two) times daily.  135 g  2   No current facility-administered medications on file prior to visit.   Allergies  Allergen Reactions  . Codeine Nausea Only  . Iodine Swelling  . Polysporin [Bacitracin-Polymyxin B] Swelling    Used for eye infection--got redder and worse  . Sulfa Antibiotics Swelling    Lips swell, itching  . Penicillins Rash   ROS:  No fevers, chills, headaches, dizziness, chest pain, rash, edema, flank pain, nausea, vomiting, abdominal pain.  +intermittent urinary urgency.  +hip and low back pain (left hip also hurts, but right is more severe).  Moods are good.  See HPI.  PHYSICAL EXAM: BP 130/70  Pulse 76  Temp(Src) 98.1 F (36.7 C) (Tympanic)  Ht 5' 5"  (1.651 m)  Wt 176 lb (79.833 kg)  BMI 29.29 kg/m2 Well developed, pleasant female in no distress Back: no CVA tenderness.  SI joints are nontender.  She is mildly tender  over the lower lumbar spine and sacrum. Extremities:  Tender over right greater trochanteric bursa. No surrounding erythema, warmth.  Urine dip--normal  ASSESSMENT/PLAN:  Greater trochanteric bursitis of right hip - inadequate response to NSAIDs.  Cortisone shot today - Plan: PR DRAIN/INJECT LARGE JOINT/BURSA  Urinary urgency - Reassured--no evidence of UTI. Drink plenty of fluids.  consider cranberry juice vs extract tabs if recurrent/worsening urinary symptoms, suggestive of UTI - Plan: POCT Urinalysis Dipstick  Hip pain, right - Plan: triamcinolone acetonide (KENALOG-40) injection 20 mg, bupivacaine (MARCAINE) 0.5 % (with pres) injection 1 mL, lidocaine (XYLOCAINE) 2 % (with pres) injection 20 mg   Right trochanteric bursa injection--cleansed with alcohol swab x 3 (allergic to iodine).  Bursa was injected with 69m kenalog, 1cc 2% lido, 1cc marcaine.  She tolerated the procedure well with relief of pain, and no complication.  Urinary symptoms--stay well hydrated.  Consider cranberry juice/extract.  Return for re-check if symptoms worsen.  Heating pad to low back.  Tylenol and/or aleve if needed for pain (try tylenol first, since it is less likely an inflammatory condition in the low back, and hopefully the bursa pain will be improved after the injection).  If you have ongoing pain, the next step should be physical therapy, vs referral to ortho.

## 2014-03-14 NOTE — Patient Instructions (Signed)
Hip Bursitis Bursitis is a swelling and soreness (inflammation) of a fluid-filled sac (bursa). This sac overlies and protects the joints.  CAUSES   Injury.  Overuse of the muscles surrounding the joint.  Arthritis.  Gout.  Infection.  Cold weather.  Inadequate warm-up and conditioning prior to activities. The cause may not be known.  SYMPTOMS   Mild to severe irritation.  Tenderness and swelling over the outside of the hip.  Pain with motion of the hip.  If the bursa becomes infected, a fever may be present. Redness, tenderness, and warmth will develop over the hip. Symptoms usually lessen in 3 to 4 weeks with treatment, but can come back. TREATMENT If conservative treatment does not work, your caregiver may advise draining the bursa and injecting cortisone into the area. This may speed up the healing process. This may also be used as an initial treatment of choice. HOME CARE INSTRUCTIONS   Apply ice to the affected area for 15-20 minutes every 3 to 4 hours while awake for the first 2 days. Put the ice in a plastic bag and place a towel between the bag of ice and your skin.  Rest the painful joint as much as possible, but continue to put the joint through a normal range of motion at least 4 times per day. When the pain lessens, begin normal, slow movements and usual activities to help prevent stiffness of the hip.  Only take over-the-counter or prescription medicines for pain, discomfort, or fever as directed by your caregiver.  Use crutches to limit weight bearing on the hip joint, if advised.  Elevate your painful hip to reduce swelling. Use pillows for propping and cushioning your legs and hips.  Gentle massage may provide comfort and decrease swelling. SEEK IMMEDIATE MEDICAL CARE IF:   Your pain increases even during treatment, or you are not improving.  You have a fever.  You have heat and inflammation over the involved bursa.  You have any other questions or  concerns. MAKE SURE YOU:   Understand these instructions.  Will watch your condition.  Will get help right away if you are not doing well or get worse. Document Released: 01/25/2002 Document Revised: 10/28/2011 Document Reviewed: 08/24/2008 [facility] Patient Information 2015 [place], [place]. This information is not intended to replace advice given to you by your health care provider. Make sure you discuss any questions you have with your health care provider.

## 2014-05-03 ENCOUNTER — Encounter: Payer: Self-pay | Admitting: Family Medicine

## 2014-05-03 ENCOUNTER — Ambulatory Visit (INDEPENDENT_AMBULATORY_CARE_PROVIDER_SITE_OTHER): Payer: BC Managed Care – PPO | Admitting: Family Medicine

## 2014-05-03 ENCOUNTER — Encounter: Payer: Self-pay | Admitting: *Deleted

## 2014-05-03 VITALS — BP 126/82 | HR 68 | Temp 98.4°F | Ht 65.0 in | Wt 174.0 lb

## 2014-05-03 DIAGNOSIS — L409 Psoriasis, unspecified: Secondary | ICD-10-CM | POA: Insufficient documentation

## 2014-05-03 DIAGNOSIS — R05 Cough: Secondary | ICD-10-CM | POA: Diagnosis not present

## 2014-05-03 DIAGNOSIS — R059 Cough, unspecified: Secondary | ICD-10-CM

## 2014-05-03 DIAGNOSIS — J3489 Other specified disorders of nose and nasal sinuses: Secondary | ICD-10-CM

## 2014-05-03 DIAGNOSIS — R197 Diarrhea, unspecified: Secondary | ICD-10-CM | POA: Diagnosis not present

## 2014-05-03 DIAGNOSIS — L408 Other psoriasis: Secondary | ICD-10-CM

## 2014-05-03 DIAGNOSIS — J309 Allergic rhinitis, unspecified: Secondary | ICD-10-CM

## 2014-05-03 MED ORDER — CLOBETASOL PROPIONATE 0.05 % EX CREA: TOPICAL_CREAM | Freq: Two times a day (BID) | CUTANEOUS | Status: DC | PRN

## 2014-05-03 NOTE — Patient Instructions (Signed)
  Drink plenty of fluids. Restart Allegra (either plain, along with sudafed, or use Allegra-D) Try sinus rinses with Neti-pot or sinus rinse kit.  Use once or twice daily. Use mucinex (get the 12 hour version, and take it twice daily--either plain or DM). You may use the benzonatate 243m three times daily as needed for cough. There is no evidence of sinus infection at this point, just sinuses being full of non-infected mucus, causing pressure and pain. Call for antibiotics if this changes--ie increasing sinus pain in one location, along with discolored mucus and/or fever. Restart Flonase, and use it daily.    Diarrhea--start probiotics (such as align, or others) Try and avoid imodium (due to recent antibiotics)--if persistent diarrhea, we should get stool studies to look for C diff (along with other stool studies for bacteria, parasites, etc.). Call and let [facility] and we can provide you with containers for the stool sample.  BRAT diet (bananas, rice, apple sauce, toast) Avoid dairy for at least a week  Return for re-evaluation if increasing abdominal pain, fever, vomiting, bloody stools or other concerns.

## 2014-05-03 NOTE — Progress Notes (Signed)
Chief Complaint  Patient presents with  . Cough    second week of August got the worst nosebleed, after coughed up huge ball of blood. Saw NP at work and was diagnosed with URI, took 10 days worth of abx without much relief. Then the last week of Aug went through 2 boxes of Kleenex. Also had steroid shot 2-3 weeks ago, never got any relief. Cannot get rid of this-sinuses hurt, mucus is yellowish. No fevers.    She started feeling bad the middle of August--sinuses had been flaring, and then had a nosebleed.  She saw NP at work, and was given antibiotics, but she never felt better.  She used Human resources officer while way at [employer].  She thought it helped at first, but her nose was so runny she went through 2 boxes of tissues.  When she got home she got a red spot in her eye, and was told she had a blocked tear duct.  She was given a steroid injection (2-3 weeks ago) to see if that could help clear up all of her complaints, but didn't feel better. She had also been given Flonase, but only used it about a week, not recently.  Currently she is complaining of pain in her face, worse when she leans forward. Also pain down the back of her neck.  "I just don't feel good".  She is having some runny nose--slight blood noted from the left, not discolored, hasn't been thick, mostly clear.  +sinus pressure, sore throat (?related to coughing vs drainage).  She is having right ear pain.  She having a dry cough--feels like there is a tickle, can't get up much phlegm.  She has coughed up just a small amount (didn't look).  Denies shortness of breath.  Some soreness in her chest and neck from coughing.  She doesn't recall the name of the antibiotic--was 2 pills daily x 10 days and thinks it is related to the PCN family (cephalosporin).  She hasn't used Human resources officer since being home from [employer]. She has taken Tylenol Cold and Sinus, Robitussin DM. She had some leftover benzonatate--unsure if it helped (didn't take much of it). She has  also taken sudafed.  9/11 she woke up with diarrhea--had to miss work.  She had at least 8-10 episodes of diarrhea.  She used imodium, and later that day she felt better, was able to eat.  She had one bout of watery diarrhea the following day, and no further diarrhea until this morning--she had 3 episodes of loose stools (not completely watery, just loose).  No blood in stool. She used imodium today, and no further diarrhea. She is having some ongoing/intermittent abdominal pain--which she has had for a long time, in the RLQ.  She "really thinks it is her appendix" which she attributes to falling onto a weight bench a long time ago.  She worries about this because Dr. Collene Mares wasn't able to get a full look in that area on scope, per pt.  She needs a refill on her clobetasol for psoriasis. Last filled by NP at work in 10/2013. Mostly affecting her lower back/buttocks, uses once daily prn.   Past Medical History  Diagnosis Date  . Psoriasis   . GERD (gastroesophageal reflux disease)   . Allergy   . Interstitial cystitis   . Leukopenia   . Edema   . Hyperlipidemia   . Diabetes mellitus 4/07    pt reports that all subsequent tests were normal, not diabetic  . Obesity   .  DJD (degenerative joint disease), cervical 04 2002  . Hypothyroid    Past Surgical History  Procedure Laterality Date  . Cholecystectomy    . Tubal ligation  1983  . Knee arthroscopy  1990    right knee  . Colonoscopy  3/09  . Varicose vein surgery  09/2009 R, 06/2010 L  . Knee arthroscopy  12/12/2010    Left knee (Dr. Noemi Chapel)   History   Social History  . Marital Status: Married    Spouse Name: N/A    Number of Children: 2  . 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 Smoking status: Never Smoker   . Smokeless tobacco: Never Used  . Alcohol Use: Yes     Comment: social, 1-2 times per year  . Drug Use: No  . Sexual Activity: Yes     Partners: Male   Other Topics Concern  . Not on file   Social History Narrative   Lives with husband, no pets.  Children in [place] and [place].  1 granddaughter   Outpatient Encounter Prescriptions as of 05/03/2014  Medication Sig Note  . Calcium-Vitamin D (CALTRATE 600 PLUS-VIT D PO) Take 1 tablet by mouth daily.   05/26/2013: Takes at least 4 days/week  . Cholecalciferol (VITAMIN D) 1000 UNITS capsule Take 1,000 Units by mouth daily.     . clobetasol cream (TEMOVATE) 0.05 % Apply topically 2 (two) times daily as needed. Use prn for psoriasis; use sparingly   . co-enzyme Q-10 30 MG capsule Take 100 mg by mouth daily.     . Cyanocobalamin (VITAMIN B 12 PO) Take 1 tablet by mouth.     . fish oil-omega-3 fatty acids 1000 MG capsule Take 1.2 g by mouth daily.   Marland Kitchen levothyroxine (SYNTHROID, LEVOTHROID) 88 MCG tablet Take 88 mcg by mouth daily.   . Magnesium 200 MG TABS Take 1 tablet by mouth 2 (two) times daily.   Marland Kitchen NEXIUM 40 MG capsule TAKE ONE CAPSULE BY MOUTH DAILY 03/14/2014: Taking every other day  . Red Yeast Rice Extract (RED YEAST RICE PO) Take 4 tablets by mouth daily.     . [DISCONTINUED] clobetasol cream (TEMOVATE) 0.05 % Apply topically 2 (two) times daily.   . naproxen sodium (ANAPROX) 220 MG tablet Take 220 mg by mouth 2 (two) times daily with a meal. 05/03/2014: Not taking currently   Allergies  Allergen Reactions  . Codeine Nausea Only  . Iodine Swelling  . Polysporin [Bacitracin-Polymyxin B] Swelling    Used for eye infection--got redder and worse  . Sulfa Antibiotics Swelling    Lips swell, itching  . Penicillins Rash   ROS:  No fevers, chills, dizziness, chest pain, shortness of breath.  +sinus headaches, posterior neck pain as per HPI.  No nausea or vomiting.  +diarrhea and mild abdominal pain (longterm) as per HPI.  No bloody stools, severe abdominal pain.  No bleeding (except nosebleeds, as per HPI), bruising, rash or other complaints except as noted.  PHYSICAL  EXAM: BP 126/82  Pulse 68  Temp(Src) 98.4 F (36.9 C) (Tympanic)  Ht _0  (1.651 m)  Wt 174 lb (78.926 kg)  BMI 28.96 kg/m2 Pleasant female, who is coughing frequently (dry/nonproductive), in no distress.  Speaking easily in full sentences HEENT: PERRL, EOMI, conjunctiva clear.  TM's and EAC's normal. Nasal mucosa is moderately edematous, pale with clear mucus.  OP has erythema posteriorly with  cobblestoning. Sinuses are tender x 4 Neck: no lymphadenopathy or mass Heart: regular rate and rhythm Lungs: clear bilaterally with good air movement.  No wheezes, rales, ronchi. Abdomen:  Active bowel sounds. No organomegaly or mass. Mild tenderness in RLQ, extending inferiorly to right suprapubic area. No mass, rebound or guarding Extremities: no edema Skin: normal texture, turgor. No visible rash  ASSESSMENT/PLAN:  Allergic rhinitis, cause unspecified  Psoriasis - Plan: clobetasol cream (TEMOVATE) 0.05 %  Cough  Diarrhea  Sinus pain   Drink plenty of fluids. Restart Allegra (either plain, along with sudafed, or use Allegra-D) Try sinus rinses with Neti-pot or sinus rinse kit.  Use once or twice daily. Use mucinex (get the 12 hour version, and take it twice daily--either plain or DM). You may use the benzonatate 225m three times daily as needed for cough. There is no evidence of sinus infection at this point, just sinuses being full of non-infected mucus, causing pressure and pain. Call for antibiotics if this changes--ie increasing sinus pain in one location, along with discolored mucus and/or fever. Restart Flonase, and use it daily.    Diarrhea--start probiotics (such as align, or others) Try and avoid imodium (due to recent antibiotics)--if persistent diarrhea, we should get stool studies to look for C diff (along with other stool studies for bacteria, parasites, etc.). Call and let [facility] and we can provide you with containers for the stool sample.  BRAT diet (bananas,  rice, apple sauce, toast) Avoid dairy for at least a week  Return for re-evaluation if increasing abdominal pain, fever, vomiting, bloody stools or other concerns.  25-30 min visit, more than 1/2 spent counseling. Abdominal pain is ongoing, no recent change, and not related to acute onset of diarrhea. We discussed risks of c.diff--she isn't interested in testing today

## 2014-05-18 ENCOUNTER — Telehealth: Payer: Self-pay | Admitting: Family Medicine

## 2014-05-18 NOTE — Telephone Encounter (Signed)
This was sent to me.

## 2014-05-18 NOTE — Telephone Encounter (Signed)
Pt called and stated she continues to still have issues. She would like to be tested for c-diff. Call when ready.

## 2014-05-18 NOTE — Telephone Encounter (Signed)
rx written, patient aware and she will come pick up.

## 2014-05-18 NOTE — Telephone Encounter (Signed)
rx written for stool studies.  Have pt pick up Rx and kit, and tell her where to return

## 2014-05-24 ENCOUNTER — Telehealth: Payer: Self-pay | Admitting: Family Medicine

## 2014-05-24 NOTE — Telephone Encounter (Signed)
Pt called to find out about her stool culture that she dropped off at lab on [address] last week.  She asked that someone call her Wednesday regarding the results.

## 2014-05-24 NOTE — Telephone Encounter (Signed)
There were no results (these usually come by fax) in my office as of yesterday evening.  Please make sure I have results--have [name] check on them)

## 2014-05-25 NOTE — Telephone Encounter (Signed)
Culture is still pending. There were no parasites, and no C.Diff.    Awaiting final results (with cultures).  Since there was no C.Diff, it is fine for her to use imodium as needed for diarrhea.  Specimen was collected 10/1--I would think there would be final results on culture by now.  Have [name] look into.

## 2014-05-25 NOTE — Telephone Encounter (Signed)
Faxed results came in today and put in your folder

## 2014-05-26 ENCOUNTER — Telehealth: Payer: Self-pay | Admitting: Family Medicine

## 2014-05-26 DIAGNOSIS — S8002XD Contusion of left knee, subsequent encounter: Secondary | ICD-10-CM | POA: Diagnosis not present

## 2014-05-26 NOTE — Telephone Encounter (Signed)
Dr.knapp Aera from lab called and said that labs were in system

## 2014-05-26 NOTE — Telephone Encounter (Signed)
Please advise pt that final results are back (came back late Thurs), and that stool studies were all normal.  No infection was seen

## 2014-05-27 NOTE — Telephone Encounter (Signed)
Pt was informed of lab results she verbalized understanding she states that she is on her 3rd week of probiotic and things are getting better

## 2014-05-30 ENCOUNTER — Encounter: Payer: Self-pay | Admitting: Family Medicine

## 2014-05-30 ENCOUNTER — Ambulatory Visit (INDEPENDENT_AMBULATORY_CARE_PROVIDER_SITE_OTHER): Payer: BC Managed Care – PPO | Admitting: Family Medicine

## 2014-05-30 VITALS — BP 140/84 | HR 72 | Ht 65.0 in | Wt 175.0 lb

## 2014-05-30 DIAGNOSIS — K529 Noninfective gastroenteritis and colitis, unspecified: Secondary | ICD-10-CM

## 2014-05-30 DIAGNOSIS — E039 Hypothyroidism, unspecified: Secondary | ICD-10-CM | POA: Diagnosis not present

## 2014-05-30 DIAGNOSIS — Z79899 Other long term (current) drug therapy: Secondary | ICD-10-CM

## 2014-05-30 LAB — CBC WITH DIFFERENTIAL/PLATELET
BASOS ABS: 0 10*3/uL (ref 0.0–0.1)
Basophils Relative: 0 % (ref 0–1)
EOS PCT: 1 % (ref 0–5)
Eosinophils Absolute: 0 10*3/uL (ref 0.0–0.7)
HCT: 41.8 % (ref 36.0–46.0)
Hemoglobin: 14.5 g/dL (ref 12.0–15.0)
LYMPHS ABS: 1.5 10*3/uL (ref 0.7–4.0)
LYMPHS PCT: 32 % (ref 12–46)
MCH: 30.7 pg (ref 26.0–34.0)
MCHC: 34.7 g/dL (ref 30.0–36.0)
MCV: 88.4 fL (ref 78.0–100.0)
Monocytes Absolute: 0.3 10*3/uL (ref 0.1–1.0)
Monocytes Relative: 7 % (ref 3–12)
NEUTROS ABS: 2.8 10*3/uL (ref 1.7–7.7)
NEUTROS PCT: 60 % (ref 43–77)
PLATELETS: 188 10*3/uL (ref 150–400)
RBC: 4.73 MIL/uL (ref 3.87–5.11)
RDW: 14.4 % (ref 11.5–15.5)
WBC: 4.6 10*3/uL (ref 4.0–10.5)

## 2014-05-30 LAB — COMPREHENSIVE METABOLIC PANEL
ALBUMIN: 4.2 g/dL (ref 3.5–5.2)
ALT: 20 U/L (ref 0–35)
AST: 20 U/L (ref 0–37)
Alkaline Phosphatase: 68 U/L (ref 39–117)
BUN: 9 mg/dL (ref 6–23)
CALCIUM: 9.3 mg/dL (ref 8.4–10.5)
CHLORIDE: 103 meq/L (ref 96–112)
CO2: 26 meq/L (ref 19–32)
CREATININE: 0.76 mg/dL (ref 0.50–1.10)
Glucose, Bld: 89 mg/dL (ref 70–99)
Potassium: 3.9 mEq/L (ref 3.5–5.3)
Sodium: 139 mEq/L (ref 135–145)
Total Bilirubin: 0.6 mg/dL (ref 0.2–1.2)
Total Protein: 7.1 g/dL (ref 6.0–8.3)

## 2014-05-30 LAB — MAGNESIUM: Magnesium: 2 mg/dL (ref 1.5–2.5)

## 2014-05-30 LAB — TSH: TSH: 1.109 u[IU]/mL (ref 0.350–4.500)

## 2014-05-30 NOTE — Progress Notes (Signed)
Chief Complaint  Patient presents with  . Diarrhea    still continuing. Stool studies came back negative.    She has been having diarrhea since 9/11.  It was severe at first (8-10 episodes that day).  Imodium helped control it somewhat.  By the time I saw her on 9/15 she was having loose stools--not as watery and not as often.  Since that time, she hasn't had a normal stool. They have all been loose and more frequent (2-4x/day). 2 nights ago (2am yesterday morning) she woke up with completely watery diarrhea. She had 3-4 episodes of diarrhea yesterday. This morning her stool was loose.  All she ate yesterday was a piece of toast and chicken noodle soup.  She has been having some muscle cramps in her legs.  She has been taking Align for 3 weeks now, has been avoiding dairy. She has not used imodium since prior to last visit (wasn't aware she could restart after negative c.diff).  Diarrhea is not related to eating.  Hasn't had problems with diarrhea related to her gallbladder--surgery was many years ago, and denies changes in diet to account for new onset problems. She has been on Nexium daily for well before the diarrhea started. She stopped taking the Magnesium (which she used to take to control constipation).  Hypothyroidism:  She has her TSH checked through the nurse practitioner at work.  Review of the chart shows that it was last checked in 06/2013.  (it was just below 1). She is due to have labwork done again in November through work.  Denies hair/skin changes.  She has some fatigue, but doesn't notice any recent change.   Past Medical History  Diagnosis Date  . Psoriasis   . GERD (gastroesophageal reflux disease)   . Allergy   . Interstitial cystitis   . Leukopenia   . Edema   . Hyperlipidemia   . Diabetes mellitus 4/07    pt reports that all subsequent tests were normal, not diabetic  . Obesity   . DJD (degenerative joint disease), cervical 04 2002  . Hypothyroid    Past Surgical  History  Procedure Laterality Date  . Cholecystectomy    . Tubal ligation  1983  . Knee arthroscopy  1990    right knee  . Colonoscopy  3/09  . Varicose vein surgery  09/2009 R, 06/2010 L  . Knee arthroscopy  12/12/2010    Left knee (Dr. Noemi Chapel)   History   Social History  . Marital Status: Married    Spouse Name: N/A    Number of Children: 2  . 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 Smoking status: Never Smoker   . Smokeless tobacco: Never Used  . Alcohol Use: Yes     Comment: social, 1-2 times per year  . Drug Use: No  . Sexual Activity: Yes    Partners: Male   Other Topics Concern  . Not on file   Social History Narrative   Lives with husband, no pets.  Children in [place] and [place].  1 granddaughter   Outpatient Encounter Prescriptions as of 05/30/2014  Medication Sig Note  . Calcium-Vitamin D (CALTRATE 600 PLUS-VIT D PO) Take 1 tablet by mouth daily.   05/26/2013: Takes at least 4 days/week  . Cholecalciferol (VITAMIN D) 1000 UNITS capsule Take 1,000 Units by mouth daily.     . clobetasol cream (TEMOVATE) 0.05 %  Apply topically 2 (two) times daily as needed. Use prn for psoriasis; use sparingly   . co-enzyme Q-10 30 MG capsule Take 100 mg by mouth daily.     . Cyanocobalamin (VITAMIN B 12 PO) Take 1 tablet by mouth.     . fish oil-omega-3 fatty acids 1000 MG capsule Take 1.2 g by mouth daily.   Marland Kitchen levothyroxine (SYNTHROID, LEVOTHROID) 88 MCG tablet Take 88 mcg by mouth daily.   Marland Kitchen NEXIUM 40 MG capsule TAKE ONE CAPSULE BY MOUTH DAILY 05/30/2014: daily  . Probiotic Product (ALIGN) 4 MG CAPS Take 1 capsule by mouth at bedtime.   . Red Yeast Rice Extract (RED YEAST RICE PO) Take 4 tablets by mouth daily.     . Magnesium 200 MG TABS Take 1 tablet by mouth 2 (two) times daily. 05/30/2014: Stopped taking (used to take to help with constipation)  . naproxen sodium (ANAPROX) 220 MG tablet Take  220 mg by mouth 2 (two) times daily with a meal. 05/03/2014: Not taking currently   Allergies  Allergen Reactions  . Codeine Nausea Only  . Iodine Swelling  . Polysporin [Bacitracin-Polymyxin B] Swelling    Used for eye infection--got redder and worse  . Sulfa Antibiotics Swelling    Lips swell, itching  . Penicillins Rash   ROS:  Denies fever, chills, nausea, vomiting.  +diarrhea as per HPI.  No black or bloody stools, no significant abdominal pain. No urinary complaints. No bleeding, bruising, rashes. No URI symptoms, cough, chest pain, shortness of breath. No hair/skin changes. No weight loss (up 1 pound since last visit).  PHYSICAL EXAM: BP 140/84  Pulse 72  Ht _0  (1.651 m)  Wt 175 lb (79.379 kg)  BMI 29.12 kg/m2 Well developed, pleasant female in no distress HEENT:  Sclera anicteric.  OP clear, moist mucus membranes Neck: no lymphadenopathy, thyromegaly or mass Heart: regular rate and rhythm Lungs: clear bilaterally Back :no CVA tenderness Abdomen: normal bowel sounds.  Very mild diffuse tenderness. No masses, rebound or guarding Extremities: no edema Skin: no visible rashes, bruises Neuro: alert and oriented.  Cranial nerves intact. Normal strength, gait Psych: normal mood, affect, hygiene and grooming  Stool studies--negative for O&P, C.diff and culture negative.  ASSESSMENT/PLAN:  Chronic diarrhea of unknown origin - Plan: Comprehensive metabolic panel, CBC with Differential, TSH, Magnesium, Celiac Panel  Hypothyroidism, unspecified hypothyroidism type - Plan: TSH  Encounter for long-term (current) use of medications - Plan: TSH, Magnesium   CBC, TSH, c-met, Mg. Celiac panel  You may use Imodium as needed for the diarrhea (since your C.diff test was negative).   Continue the Align probiotic and lactose-free diet. Continue to stay off the magnesium. We are checking you for gluten allergy/Celiac Sprue.  If tests are negative, then I recommend following up with  Dr. Collene Mares for further evaluation (you may need colonoscopy). We can consider trying bile salts (ie Questran) to see if that helps (this often helps people who are having diarrhea related to not having their gall bladder, usually related to not absorbing fats well).

## 2014-05-31 LAB — GLIA (IGA/G) + TTG IGA
Gliadin IgA: 12.9 U/mL (ref <20)
Gliadin IgG: 7.7 U/mL (ref <20)
Tissue Transglutaminase Ab, IgA: 6.2 U/mL (ref <20)

## 2014-06-15 ENCOUNTER — Other Ambulatory Visit: Payer: Self-pay | Admitting: Family Medicine

## 2014-06-20 ENCOUNTER — Encounter: Payer: Self-pay | Admitting: Family Medicine

## 2014-07-21 ENCOUNTER — Telehealth: Payer: Self-pay | Admitting: Family Medicine

## 2014-07-21 MED ORDER — LEVOTHYROXINE SODIUM 88 MCG PO TABS: 88.0000 ug | ORAL_TABLET | Freq: Every day | ORAL | Status: DC

## 2014-07-21 NOTE — Telephone Encounter (Signed)
Done. Called patient first to see if she truly as she has appt next week-she is out of med.

## 2014-07-26 DIAGNOSIS — K219 Gastro-esophageal reflux disease without esophagitis: Secondary | ICD-10-CM | POA: Diagnosis not present

## 2014-07-26 DIAGNOSIS — R1031 Right lower quadrant pain: Secondary | ICD-10-CM | POA: Diagnosis not present

## 2014-07-26 DIAGNOSIS — M25551 Pain in right hip: Secondary | ICD-10-CM | POA: Diagnosis not present

## 2014-07-26 DIAGNOSIS — K625 Hemorrhage of anus and rectum: Secondary | ICD-10-CM | POA: Diagnosis not present

## 2014-07-27 ENCOUNTER — Encounter: Payer: Self-pay | Admitting: Family Medicine

## 2014-07-27 ENCOUNTER — Ambulatory Visit (INDEPENDENT_AMBULATORY_CARE_PROVIDER_SITE_OTHER): Payer: Medicare Other | Admitting: Family Medicine

## 2014-07-27 VITALS — BP 118/70 | HR 60 | Ht 64.25 in | Wt 178.0 lb

## 2014-07-27 DIAGNOSIS — M7061 Trochanteric bursitis, right hip: Secondary | ICD-10-CM

## 2014-07-27 DIAGNOSIS — E7800 Pure hypercholesterolemia, unspecified: Secondary | ICD-10-CM

## 2014-07-27 DIAGNOSIS — E559 Vitamin D deficiency, unspecified: Secondary | ICD-10-CM | POA: Diagnosis not present

## 2014-07-27 DIAGNOSIS — E039 Hypothyroidism, unspecified: Secondary | ICD-10-CM | POA: Diagnosis not present

## 2014-07-27 DIAGNOSIS — L409 Psoriasis, unspecified: Secondary | ICD-10-CM

## 2014-07-27 DIAGNOSIS — Z0000 Encounter for general adult medical examination without abnormal findings: Secondary | ICD-10-CM

## 2014-07-27 DIAGNOSIS — Z23 Encounter for immunization: Secondary | ICD-10-CM

## 2014-07-27 DIAGNOSIS — Z780 Asymptomatic menopausal state: Secondary | ICD-10-CM

## 2014-07-27 DIAGNOSIS — K219 Gastro-esophageal reflux disease without esophagitis: Secondary | ICD-10-CM | POA: Diagnosis not present

## 2014-07-27 LAB — POCT URINALYSIS DIPSTICK
Bilirubin, UA: NEGATIVE
Glucose, UA: NEGATIVE
KETONES UA: NEGATIVE
Leukocytes, UA: NEGATIVE
Nitrite, UA: NEGATIVE
Protein, UA: NEGATIVE
RBC UA: NEGATIVE
Spec Grav, UA: 1.025
Urobilinogen, UA: NEGATIVE
pH, UA: 6

## 2014-07-27 MED ORDER — NEXIUM 40 MG PO CPDR: 40.0000 mg | DELAYED_RELEASE_CAPSULE | Freq: Every day | ORAL | Status: DC

## 2014-07-27 MED ORDER — LEVOTHYROXINE SODIUM 88 MCG PO TABS: 88.0000 ug | ORAL_TABLET | Freq: Every day | ORAL | Status: DC

## 2014-07-27 NOTE — Addendum Note (Signed)
Addended by: Rita Ohara on: 07/27/2014 09:30 PM   Modules accepted: Level of Service

## 2014-07-27 NOTE — Patient Instructions (Addendum)
HEALTH MAINTENANCE RECOMMENDATIONS:  It is recommended that you get at least 30 minutes of aerobic exercise at least 5 days/week (for weight loss, you may need as much as 60-90 minutes). This can be any activity that gets your heart rate up. This can be divided in 10-15 minute intervals if needed, but try and build up your endurance at least once a week.  Weight bearing exercise is also recommended twice weekly.  Eat a healthy diet with lots of vegetables, fruits and fiber.  "Colorful" foods have a lot of vitamins (ie green vegetables, tomatoes, red peppers, etc).  Limit sweet tea, regular sodas and alcoholic beverages, all of which has a lot of calories and sugar.  Up to 1 alcoholic drink daily may be beneficial for women (unless trying to lose weight, watch sugars).  Drink a lot of water.  Calcium recommendations are 1200-1500 mg daily (1500 mg for postmenopausal women or women without ovaries), and vitamin D 1000 IU daily.  This should be obtained from diet and/or supplements (vitamins), and calcium should not be taken all at once, but in divided doses.  Monthly self breast exams and yearly mammograms for women over the age of 66 is recommended.  Sunscreen of at least SPF 30 should be used on all sun-exposed parts of the skin when outside between the hours of 10 am and 4 pm (not just when at beach or pool, but even with exercise, golf, tennis, and yard work!)  Use a sunscreen that says "broad spectrum" so it covers both UVA and UVB rays, and make sure to reapply every 1-2 hours.  Remember to change the batteries in your smoke detectors when changing your clock times in the spring and fall.  Use your seat belt every time you are in a car, and please drive safely and not be distracted with cell phones and texting while driving.  Fat and Cholesterol Control Diet Fat and cholesterol levels in your blood and organs are influenced by your diet. High levels of fat and cholesterol may lead to diseases  of the heart, small and large blood vessels, gallbladder, liver, and pancreas. CONTROLLING FAT AND CHOLESTEROL WITH DIET Although exercise and lifestyle factors are important, your diet is key. That is because certain foods are known to raise cholesterol and others to lower it. The goal is to balance foods for their effect on cholesterol and more importantly, to replace saturated and trans fat with other types of fat, such as monounsaturated fat, polyunsaturated fat, and omega-3 fatty acids. On average, a person should consume no more than 15 to 17 g of saturated fat daily. Saturated and trans fats are considered "bad" fats, and they will raise LDL cholesterol. Saturated fats are primarily found in animal products such as meats, butter, and cream. However, that does not mean you need to give up all your favorite foods. Today, there are good tasting, low-fat, low-cholesterol substitutes for most of the things you like to eat. Choose low-fat or nonfat alternatives. Choose round or loin cuts of red meat. These types of cuts are lowest in fat and cholesterol. Chicken (without the skin), fish, veal, and ground [place] breast are great choices. Eliminate fatty meats, such as hot dogs and salami. Even shellfish have little or no saturated fat. Have a 3 oz (85 g) portion when you eat lean meat, poultry, or fish. Trans fats are also called "partially hydrogenated oils." They are oils that have been scientifically manipulated so that they are solid at room temperature  resulting in a longer shelf life and improved taste and texture of foods in which they are added. Trans fats are found in stick margarine, some tub margarines, cookies, crackers, and baked goods.  When baking and cooking, oils are a great substitute for butter. The monounsaturated oils are especially beneficial since it is believed they lower LDL and raise HDL. The oils you should avoid entirely are saturated tropical oils, such as coconut and palm.    Remember to eat a lot from food groups that are naturally free of saturated and trans fat, including fish, fruit, vegetables, beans, grains (barley, rice, couscous, bulgur wheat), and pasta (without cream sauces).  IDENTIFYING FOODS THAT LOWER FAT AND CHOLESTEROL  Soluble fiber may lower your cholesterol. This type of fiber is found in fruits such as apples, vegetables such as broccoli, potatoes, and carrots, legumes such as beans, peas, and lentils, and grains such as barley. Foods fortified with plant sterols (phytosterol) may also lower cholesterol. You should eat at least 2 g per day of these foods for a cholesterol lowering effect.  Read package labels to identify low-saturated fats, trans fat free, and low-fat foods at the supermarket. Select cheeses that have only 2 to 3 g saturated fat per ounce. Use a heart-healthy tub margarine that is free of trans fats or partially hydrogenated oil. When buying baked goods (cookies, crackers), avoid partially hydrogenated oils. Breads and muffins should be made from whole grains (whole-wheat or whole oat flour, instead of "flour" or "enriched flour"). Buy non-creamy canned soups with reduced salt and no added fats.  FOOD PREPARATION TECHNIQUES  Never deep-fry. If you must fry, either stir-fry, which uses very little fat, or use non-stick cooking sprays. When possible, broil, bake, or roast meats, and steam vegetables. Instead of putting butter or margarine on vegetables, use lemon and herbs, applesauce, and cinnamon (for squash and sweet potatoes). Use nonfat yogurt, salsa, and low-fat dressings for salads.  LOW-SATURATED FAT / LOW-FAT FOOD SUBSTITUTES Meats / Saturated Fat (g)  Avoid: Steak, marbled (3 oz/85 g) / 11 g  Choose: Steak, lean (3 oz/85 g) / 4 g  Avoid: Hamburger (3 oz/85 g) / 7 g  Choose: Hamburger, lean (3 oz/85 g) / 5 g  Avoid: Ham (3 oz/85 g) / 6 g  Choose: Ham, lean cut (3 oz/85 g) / 2.4 g  Avoid: Chicken, with skin, dark meat (3  oz/85 g) / 4 g  Choose: Chicken, skin removed, dark meat (3 oz/85 g) / 2 g  Avoid: Chicken, with skin, light meat (3 oz/85 g) / 2.5 g  Choose: Chicken, skin removed, light meat (3 oz/85 g) / 1 g Dairy / Saturated Fat (g)  Avoid: Whole milk (1 cup) / 5 g  Choose: Low-fat milk, 2% (1 cup) / 3 g  Choose: Low-fat milk, 1% (1 cup) / 1.5 g  Choose: Skim milk (1 cup) / 0.3 g  Avoid: Hard cheese (1 oz/28 g) / 6 g  Choose: Skim milk cheese (1 oz/28 g) / 2 to 3 g  Avoid: Cottage cheese, 4% fat (1 cup) / 6.5 g  Choose: Low-fat cottage cheese, 1% fat (1 cup) / 1.5 g  Avoid: Ice cream (1 cup) / 9 g  Choose: Sherbet (1 cup) / 2.5 g  Choose: Nonfat frozen yogurt (1 cup) / 0.3 g  Choose: Frozen fruit bar / trace  Avoid: Whipped cream (1 tbs) / 3.5 g  Choose: Nondairy whipped topping (1 tbs) / 1 g Condiments /  Saturated Fat (g)  Avoid: Mayonnaise (1 tbs) / 2 g  Choose: Low-fat mayonnaise (1 tbs) / 1 g  Avoid: Butter (1 tbs) / 7 g  Choose: Extra light margarine (1 tbs) / 1 g  Avoid: Coconut oil (1 tbs) / 11.8 g  Choose: Olive oil (1 tbs) / 1.8 g  Choose: Corn oil (1 tbs) / 1.7 g  Choose: Safflower oil (1 tbs) / 1.2 g  Choose: Sunflower oil (1 tbs) / 1.4 g  Choose: Soybean oil (1 tbs) / 2.4 g  Choose: Canola oil (1 tbs) / 1 g Document Released: 08/05/2005 Document Revised: 11/30/2012 Document Reviewed: 11/03/2013 [employer] Patient Information 2015 [place], [place]. This information is not intended to replace advice given to you by your health care provider. Make sure you discuss any questions you have with your health care provider.  Plan to recheck your cholesterol in 6 months--you were given prescription to get this done through NP at work.

## 2014-07-27 NOTE — Progress Notes (Signed)
Chief Complaint  Patient presents with  . Welcome to Medicare    fasting annual exam without pap-sees MckenzieWein and is UTD. Urine was negative but she states that she is having lower abdominal pain. Also wants cortisone injection again right hip-but would like to know if you would be able to do left as well. Patient brings in labs today from mid Nov.    Mckenzie Webb is a 68 y.o. female who presents for Welcome to Medicare visit, as well as follow up on chronic problems (med check).  She brings in labs done from November, done at work. 07/01/14: Fasting glucose 109, rest of chem panel was normal.  A1c 5.7 Chol 252, TG 134, HDL 82, LDL 143 TSH 2.160  Diarrhea--started in September.  She continues to have diarrhea, but not daily.  She saw Dr. Collene Webb yesterday and was switched from [place] to a different probiotic (she can't recall the name, is kept in the refrigerator).  She has frequent stools in the morning, the first of which is usually normal, others are loose.  She doesn't think it is related to eating, but those after breakfast are looser than before breakfast. She has some ongoing lower abdominal pain over the last month, and discussed this with GI yesterday.  No urinary complaints.  Hip bursitis:  She had cortisone injection 02/2014 (after failing NSAIDs).  She has had recurrent pain in the last few weeks.  Both hips have always bothered her, the injection was only tried in the right. The right is more painful than the left at present.  Hypothyroidism--TSH checked in November at work and was normal. Denies any changes in hair, skin, or bowels. No changes in weight, energy, moods. Skin and hair are always dry, unchanged.   Psoriasis--doing well. Doesn't need refills yet, will call when needed.  Hyperlipidemia: She continues to take red yeast rice, and 1 fish oil capsule daily (same as what she was taking when labs last checked at work in November (see above). Results from November 2014 were:  total cholesterol 216, HDL 79, LDL 120, TG 83.  GERD--She has been on Nexium daily for about a year.  She tried quitting taking it cold [place] and had recurrent symptoms.  She hasn't tried taking it every other day.  Her symptoms are completely controlled, she hasn't missed any doses to know if symptoms recurred.  She hasn't tried the OTC dose.    Immunization History  Administered Date(s) Administered  . Influenza Split 06/20/2011, 06/19/2012, 05/25/2014  . Influenza Whole 05/19/2000, 05/19/2001  . Pneumococcal Conjugate-13 07/26/2013  . Pneumococcal Polysaccharide-23 06/16/2000  . Td 04/13/2001  . Tdap 12/31/2010  . Zoster 07/26/2013   Last Pap smear: UTD through GYN  Last mammogram: 12/2013  Last colonoscopy: 3/09  Last DEXA: 11/2009, normal  Dentist: every 6 months  Ophtho: yearly Exercise: walking only with Christmas shopping   Other doctors caring for patient include: Ophtho: [facility] in [place] GYN:  Dr. Stann Webb GI:  Dr. Collene Webb Dentist: can't recall the name NP at work (on [place] through her job, Mckenzie Webb, can still see for the next year)--Mckenzie Webb Previously saw urologist at [facility] (for IC), not seen in years  Depression screen:  See scanned questionnaire.  Notable only for occasional trouble sleeping (uses Tylenol PM occasionally). ADL screen:  See scanned questionnaire.  Notable only for occasional weakness in her legs, that she attributes to pain from her hip bursitis and knees  End of Life Discussion:  Patient does not  have a living will and medical power of attorney  Past Medical History  Diagnosis Date  . Psoriasis   . GERD (gastroesophageal reflux disease)   . Allergy   . Interstitial cystitis   . Leukopenia   . Edema   . Hyperlipidemia   . Diabetes mellitus 4/07    pt reports that all subsequent tests were normal, not diabetic  . Obesity   . DJD (degenerative joint disease), cervical 04 2002  . Hypothyroid   . Prediabetes  06/2014    fasting glucose 109; A1c 5.7    Past Surgical History  Procedure Laterality Date  . Cholecystectomy    . Tubal ligation  1983  . Knee arthroscopy Right 1990  . Colonoscopy  3/09  . Varicose vein surgery  09/2009 R, 06/2010 L  . Knee arthroscopy Left 12/12/2010     (Dr. Noemi Chapel)    History   Social History  . Marital Status: Married    Spouse Name: N/A    Number of Children: 2  . Years of Education: N/A   Occupational History  . office work for [employer]    Social History Main Topics  . Smoking status: Never Smoker   . Smokeless tobacco: Never Used  . Alcohol Use: Yes     Comment: social, 1-2 times per year  . Drug Use: No  . Sexual Activity:    Partners: Male   Other Topics Concern  . Not on file   Social History Narrative   Lives with husband, no pets.  Children in [place], [place] and [place].  1 granddaughter. Retired 05/2014.    Family History  Problem Relation Age of Onset  . Hypertension Mother   . Heart disease Mother   . Gallbladder disease Mother   . Thyroid disease Mother   . Arthritis Mother   . Diabetes Father   . Cancer Father     pancreatic  . Hypertension Sister   . Gallbladder disease Sister   . Diabetes Sister   . Diverticulitis Sister     of small intestine  . Cancer Son     testicular cancer  . Stroke Sister     late 5's  . Hypertension Sister   . Pulmonary embolism Sister   . Ulcerative colitis Sister   . Breast cancer Neg Hx   . Colon cancer Neg Hx     Outpatient Encounter Prescriptions as of 07/27/2014  Medication Sig Note  . Calcium-Vitamin D (CALTRATE 600 PLUS-VIT D PO) Take 1 tablet by mouth daily.   07/27/2014: Takes daily  . Cholecalciferol (VITAMIN D) 1000 UNITS capsule Take 1,000 Units by mouth daily.     Marland Kitchen co-enzyme Q-10 30 MG capsule Take 100 mg by mouth daily.     . Cyanocobalamin (VITAMIN B 12 PO) Take 1 tablet by mouth.     . fish oil-omega-3 fatty acids 1000 MG capsule Take 1.2 g by mouth  daily.   Marland Kitchen levothyroxine (SYNTHROID, LEVOTHROID) 88 MCG tablet Take 1 tablet (88 mcg total) by mouth daily.   Marland Kitchen NEXIUM 40 MG capsule TAKE ONE CAPSULE BY MOUTH DAILY   . Probiotic Product (ALIGN) 4 MG CAPS Take 1 capsule by mouth at bedtime.   . Red Yeast Rice Extract (RED YEAST RICE PO) Take 4 tablets by mouth daily.     . clobetasol cream (TEMOVATE) 0.05 % Apply topically 2 (two) times daily as needed. Use prn for psoriasis; use sparingly (Patient not taking: Reported on 07/27/2014)   . [  DISCONTINUED] Magnesium 200 MG TABS Take 1 tablet by mouth 2 (two) times daily. 05/30/2014: Stopped taking (used to take to help with constipation)  . [DISCONTINUED] naproxen sodium (ANAPROX) 220 MG tablet Take 220 mg by mouth 2 (two) times daily with a meal. 05/03/2014: Not taking currently    Allergies  Allergen Reactions  . Codeine Nausea Only  . Iodine Swelling  . Polysporin [Bacitracin-Polymyxin B] Swelling    Used for eye infection--got redder and worse  . Sulfa Antibiotics Swelling    Lips swell, itching  . Penicillins Rash   ROS: The patient denies anorexia, fever, headaches, vision changes, decreased hearing, ear pain, sore throat, breast concerns, chest pain, palpitations, dizziness, syncope, dyspnea on exertion, cough, swelling, nausea, vomiting, diarrhea, melena, hematochezia, hematuria, incontinence, dysuria, vaginal bleeding, discharge, odor or itch, genital lesions, numbness, tingling, weakness, tremor, suspicious skin lesions, depression, anxiety, abnormal bleeding/bruising, or enlarged lymph nodes. No memory concerns. Has intermittent insomnia, relieved by Tylenol PM as needed. hemorroids periodically flare. Low back pain, mild and constant for a while (unchanged from last year). No radiation, numbness, tingling, weakness Also complaining of R>L hip pain She is having some lower abdominal pain over the last month--see HPI.  No urinary symptoms   PHYSICAL EXAM:  BP 118/70 mmHg  Pulse 60   Ht 5' 4.25" (1.632 m)  Wt 178 lb (80.74 kg)  BMI 30.31 kg/m2  General Appearance:  Alert, cooperative, no distress, appears stated age   Head:  Normocephalic, without obvious abnormality, atraumatic   Eyes:  PERRL, conjunctiva/corneas clear, EOM's intact, fundi  benign   Ears:  Normal TM's and external ear canals   Nose:  Nares normal, mucosa normal, no drainage or sinus tenderness   Throat:  Lips, mucosa, and tongue normal; teeth and gums normal   Neck:  Supple, no lymphadenopathy; thyroid: no enlargement/tenderness/nodules; no carotid  bruit or JVD   Back:  Spine nontender, no curvature, ROM normal, no CVA tenderness. Tender at bilateral SI joints. nontender at sciatic notch, no muscle spasm  Lungs:  Clear to auscultation bilaterally without wheezes, rales or ronchi; respirations unlabored   Chest Wall:  No tenderness or deformity   Heart:  Regular rate and rhythm, S1 and S2 normal, no murmur, rub  or gallop   Breast Exam:  Deferred to GYN   Abdomen:  Soft, nondistended, normoactive bowel sounds,  no masses, no hepatosplenomegaly. Diffusely tender starting centrally above the umbilicus, and across entire lower abdomen.  No rebound or guarding.  Genitalia:  Deferred to GYN      Extremities:  No clubbing, cyanosis or edema. Tender at bilateral trochanteric bursae  Pulses:  2+ and symmetric all extremities   Skin:  Skin color, texture, turgor normal, no rashes or lesions   Lymph nodes:  Cervical, supraclavicular, and axillary nodes normal   Neurologic:  CNII-XII intact, normal strength, sensation and gait; reflexes 2+ and symmetric throughout    Psych: Normal mood, affect, hygiene and grooming.    EKG:  Sinus bradycardia, rate 57.  Poor R wave progression noted. No acute abnormalities noted. No prior EKG's for comparison.   ASSESSMENT/PLAN:  Welcome to Medicare preventive visit - Plan: DG Bone Density, EKG  12-Lead  Annual physical exam - Plan: Visual acuity screening, POCT Urinalysis Dipstick  Immunization due - Plan: Pneumococcal polysaccharide vaccine 23-valent greater than or equal to 2yo subcutaneous/IM  Postmenopausal - Plan: DG Bone Density  Gastroesophageal reflux disease, esophagitis presence not specified - controlled.  consider taper dose--trial qod  vs trial OTC dose - Plan: NEXIUM 40 MG capsule  Hypothyroidism, unspecified hypothyroidism type - adequately replaced - Plan: DG Bone Density, levothyroxine (SYNTHROID, LEVOTHROID) 88 MCG tablet  Vitamin D deficiency - continue daily supplement - Plan: DG Bone Density  Pure hypercholesterolemia - higher than last year.  reviewed. low cholesterol diet.  recheck 6 mos  Greater trochanteric bursitis of right hip - now has bilateral hip bursitis.  She will return for bilateral injections, as NSAIDs weren't effective in the past.  Psoriasis - well controlled.  refills not needed at this time  Discussed monthly self breast exams and yearly mammograms; at least 30 minutes of aerobic activity at least 5 days/week and weight-bearing exercise 2x/week; proper sunscreen use reviewed; healthy diet, including goals of calcium and vitamin D intake and alcohol recommendations (less than or equal to 1 drink/day) reviewed; regular seatbelt use; changing batteries in smoke detectors.  Immunization recommendations discussed--pneumovax today.  Colonoscopy recommendations reviewed, UTD  [facility]  Consider trial of Questran (she can discuss with Dr. Collene Webb) if new probiotic doesn't take care of her symptoms of frequent loose stools.  Hyperlipidemia--reviewed diet. Recheck 6 months.  Continue current regimen  Will try and find prior EKG for comparison.  Paperwork given for Living Will and Healthcare POA given to patient. MOST form filled out--full code, full care.  Abdominal pain--likely GI.  Try new probiotics and f/u with  Dr. Collene Webb prn  Will get labs through NP in 6 months Fasting glucose, A1c, lipids  Medicare Attestation I have personally reviewed: The patient's medical and social history Their use of alcohol, tobacco or illicit drugs Their current medications and supplements The patient's functional ability including ADLs,fall risks, home safety risks, cognitive, and hearing and visual impairment Diet and physical activities Evidence for depression or mood disorders  The patient's weight, height, BMI, and visual acuity have been recorded in the chart.  I have made referrals, counseling, and provided education to the patient based on review of the above and I have provided the patient with a written personalized care plan for preventive services.     Aydan Phoenix A, MD   07/27/2014

## 2014-07-28 NOTE — Progress Notes (Signed)
I pulled her old chart and found several EKG's and other cardio info-put info and chart on your shelf for your review.

## 2014-07-29 ENCOUNTER — Encounter: Payer: Self-pay | Admitting: Family Medicine

## 2014-08-04 ENCOUNTER — Ambulatory Visit (INDEPENDENT_AMBULATORY_CARE_PROVIDER_SITE_OTHER): Payer: BC Managed Care – PPO | Admitting: Family Medicine

## 2014-08-04 ENCOUNTER — Encounter: Payer: Self-pay | Admitting: Family Medicine

## 2014-08-04 VITALS — BP 140/80 | HR 72 | Ht 64.25 in | Wt 178.0 lb

## 2014-08-04 DIAGNOSIS — M7061 Trochanteric bursitis, right hip: Secondary | ICD-10-CM

## 2014-08-04 DIAGNOSIS — M7062 Trochanteric bursitis, left hip: Secondary | ICD-10-CM | POA: Diagnosis not present

## 2014-08-04 MED ORDER — TRIAMCINOLONE ACETONIDE 40 MG/ML IJ SUSP
20.0000 mg | Freq: Once | INTRAMUSCULAR | Status: AC
  Administered 2014-08-04: 20 mg via INTRAMUSCULAR

## 2014-08-04 MED ORDER — LIDOCAINE HCL 2 % IJ SOLN
1.5000 mL | Freq: Once | INTRAMUSCULAR | Status: AC
  Administered 2014-08-04: 30 mg

## 2014-08-04 MED ORDER — LIDOCAINE HCL 2 % IJ SOLN
1.5000 mL | Freq: Once | INTRAMUSCULAR | Status: AC
Start: 2014-08-04 — End: 2014-08-04
  Administered 2014-08-04: 30 mg

## 2014-08-04 NOTE — Progress Notes (Signed)
Chief Complaint  Patient presents with  . Injections    B/L hip injections.    Patient presents for treatment of bilateral trochanteric bursitis. She is aware of the risks, benefits and potential alternatives.  She has previously tried NSAIDs without relief, and has had good relief from prior injection.   BP 140/80 mmHg  Pulse 72  Ht 5' 4.25" (1.632 m)  Wt 178 lb (80.74 kg)  BMI 30.31 kg/m2   Area was cleansed with alcohol.  Ethyl chloride spray was applied, then trochanteric bursa was injected with 49m of kenalog, and 1.5 cc of 2% lidocaine without epi.   The same procedure was done bilaterally. She tolerated both without any complication, and had immediate improvement in pain.  ASSESSMENT/PLAN:  Greater trochanteric bursitis of both hips - Plan: lidocaine (XYLOCAINE) 2 % (with pres) injection 30 mg, lidocaine (XYLOCAINE) 2 % (with pres) injection 30 mg, triamcinolone acetonide (KENALOG-40) injection 20 mg, triamcinolone acetonide (KENALOG-40) injection 20 mg, PR DRAIN/INJECT LARGE JOINT/BURSA

## 2014-08-17 ENCOUNTER — Encounter: Payer: Self-pay | Admitting: Family Medicine

## 2014-08-17 ENCOUNTER — Ambulatory Visit (INDEPENDENT_AMBULATORY_CARE_PROVIDER_SITE_OTHER): Payer: BC Managed Care – PPO | Admitting: Family Medicine

## 2014-08-17 VITALS — BP 144/82 | HR 64 | Temp 97.8°F | Ht 65.0 in | Wt 177.0 lb

## 2014-08-17 DIAGNOSIS — N3000 Acute cystitis without hematuria: Secondary | ICD-10-CM

## 2014-08-17 DIAGNOSIS — R350 Frequency of micturition: Secondary | ICD-10-CM

## 2014-08-17 LAB — POCT URINALYSIS DIPSTICK
BILIRUBIN UA: NEGATIVE
Glucose, UA: NEGATIVE
KETONES UA: NEGATIVE
Nitrite, UA: NEGATIVE
PH UA: 7
Protein, UA: NEGATIVE
Spec Grav, UA: 1.015
Urobilinogen, UA: NEGATIVE

## 2014-08-17 MED ORDER — NITROFURANTOIN MONOHYD MACRO 100 MG PO CAPS: 100.0000 mg | ORAL_CAPSULE | Freq: Two times a day (BID) | ORAL | Status: DC

## 2014-08-17 NOTE — Patient Instructions (Signed)

## 2014-08-17 NOTE — Progress Notes (Signed)
Chief Complaint  Patient presents with  . Urinary Frequency    and pain and pressure, started in the middle of the night last night.    Last night she woke up with urinary frequency and pain.  She is having pressure in the lower abdomen.  She has some lower back discomfort, but no flank pain.  Denies fevers, chills, nausea, vomiting.  +urgency, frequency, dysuria.  No visible blood in urine.  Denies vaginal discharge.  Last urine culture was 11/2013, showing Klebsiella--intermittent sensitivity to nitrofurantoin, and sensitive to cipro, and was treated with cipro (she lists as "intolerance" and is willing to take again if she needs to).  PMH, PSH, SH reviewed.  Outpatient Encounter Prescriptions as of 08/17/2014  Medication Sig  . Calcium-Vitamin D (CALTRATE 600 PLUS-VIT D PO) Take 1 tablet by mouth daily.    . Cholecalciferol (VITAMIN D) 1000 UNITS capsule Take 1,000 Units by mouth daily.    . clobetasol cream (TEMOVATE) 0.05 % Apply topically 2 (two) times daily as needed. Use prn for psoriasis; use sparingly  . co-enzyme Q-10 30 MG capsule Take 100 mg by mouth daily.    . Cyanocobalamin (VITAMIN B 12 PO) Take 1 tablet by mouth.    . fish oil-omega-3 fatty acids 1000 MG capsule Take 1.2 g by mouth daily.  Marland Kitchen levothyroxine (SYNTHROID, LEVOTHROID) 88 MCG tablet Take 1 tablet (88 mcg total) by mouth daily.  Marland Kitchen NEXIUM 40 MG capsule Take 1 capsule (40 mg total) by mouth daily.  . Probiotic Product (ALIGN) 4 MG CAPS Take 1 capsule by mouth at bedtime.  . Red Yeast Rice Extract (RED YEAST RICE PO) Take 4 tablets by mouth daily.     Allergies  Allergen Reactions  . Ciprofloxacin     After she takes for a while it gives her a "funny feeling."  . Codeine Nausea Only  . Iodine Swelling  . Polysporin [Bacitracin-Polymyxin B] Swelling    Used for eye infection--got redder and worse  . Sulfa Antibiotics Swelling    Lips swell, itching  . Penicillins Rash   ROS:  No fevers, chills, flank pain,  nausea, vomiting, rash, swelling, chest pain, URI symptoms or other complaints.  See HPI.  PHYSICAL EXAM: BP 144/82 mmHg  Pulse 64  Temp(Src) 97.8 F (36.6 C) (Tympanic)  Ht 5' 5"  (1.651 m)  Wt 177 lb (80.287 kg)  BMI 29.45 kg/m2 Well appearing female in no acute distress--holding pressure over her lower abdomen. Neck: no lymphadenopathy or mass Abdomen: soft, no organomegaly or mass.  +suprapubic tenderness Back: No CVA tenderness  Urine dip: 1+ blood and 1+ leuks  ASSESSMENT/PLAN:  Urinary frequency - Plan: POCT Urinalysis Dipstick  Acute cystitis without hematuria - Plan: Urine culture, nitrofurantoin, macrocrystal-monohydrate, (MACROBID) 100 MG capsule  Pt to call on Monday if symptoms not improving with the macrobid.  Culture will be back by then, and she is willing to take cipro again, if needed. She will contact us sooner if high fevers, flank pain, or other worsening symptoms.

## 2014-08-19 LAB — URINE CULTURE

## 2014-10-11 DIAGNOSIS — M545 Low back pain: Secondary | ICD-10-CM | POA: Diagnosis not present

## 2014-10-13 ENCOUNTER — Other Ambulatory Visit: Payer: Self-pay | Admitting: Orthopedic Surgery

## 2014-10-13 DIAGNOSIS — M545 Low back pain: Secondary | ICD-10-CM

## 2014-10-15 ENCOUNTER — Ambulatory Visit
Admission: RE | Admit: 2014-10-15 | Discharge: 2014-10-15 | Disposition: A | Discharge status: Home / Self Care | Payer: Medicare Other | Source: Ambulatory Visit | Attending: Orthopedic Surgery | Admitting: Orthopedic Surgery

## 2014-10-15 DIAGNOSIS — M4316 Spondylolisthesis, lumbar region: Secondary | ICD-10-CM | POA: Diagnosis not present

## 2014-10-15 DIAGNOSIS — M5127 Other intervertebral disc displacement, lumbosacral region: Secondary | ICD-10-CM | POA: Diagnosis not present

## 2014-10-15 DIAGNOSIS — M545 Low back pain: Secondary | ICD-10-CM

## 2014-10-15 DIAGNOSIS — M4186 Other forms of scoliosis, lumbar region: Secondary | ICD-10-CM | POA: Diagnosis not present

## 2014-10-26 DIAGNOSIS — M5441 Lumbago with sciatica, right side: Secondary | ICD-10-CM | POA: Diagnosis not present

## 2014-11-09 DIAGNOSIS — M545 Low back pain: Secondary | ICD-10-CM | POA: Diagnosis not present

## 2014-11-09 DIAGNOSIS — M47816 Spondylosis without myelopathy or radiculopathy, lumbar region: Secondary | ICD-10-CM | POA: Diagnosis not present

## 2014-11-09 DIAGNOSIS — M419 Scoliosis, unspecified: Secondary | ICD-10-CM | POA: Diagnosis not present

## 2014-11-09 DIAGNOSIS — M47817 Spondylosis without myelopathy or radiculopathy, lumbosacral region: Secondary | ICD-10-CM | POA: Diagnosis not present

## 2014-11-16 DIAGNOSIS — M545 Low back pain: Secondary | ICD-10-CM | POA: Diagnosis not present

## 2014-11-16 DIAGNOSIS — R293 Abnormal posture: Secondary | ICD-10-CM | POA: Diagnosis not present

## 2014-11-16 DIAGNOSIS — M6281 Muscle weakness (generalized): Secondary | ICD-10-CM | POA: Diagnosis not present

## 2014-11-16 DIAGNOSIS — M2560 Stiffness of unspecified joint, not elsewhere classified: Secondary | ICD-10-CM | POA: Diagnosis not present

## 2014-11-18 DIAGNOSIS — R293 Abnormal posture: Secondary | ICD-10-CM | POA: Diagnosis not present

## 2014-11-18 DIAGNOSIS — M545 Low back pain: Secondary | ICD-10-CM | POA: Diagnosis not present

## 2014-11-18 DIAGNOSIS — M2560 Stiffness of unspecified joint, not elsewhere classified: Secondary | ICD-10-CM | POA: Diagnosis not present

## 2014-11-18 DIAGNOSIS — M6281 Muscle weakness (generalized): Secondary | ICD-10-CM | POA: Diagnosis not present

## 2014-11-22 DIAGNOSIS — M2560 Stiffness of unspecified joint, not elsewhere classified: Secondary | ICD-10-CM | POA: Diagnosis not present

## 2014-11-22 DIAGNOSIS — M545 Low back pain: Secondary | ICD-10-CM | POA: Diagnosis not present

## 2014-11-22 DIAGNOSIS — M6281 Muscle weakness (generalized): Secondary | ICD-10-CM | POA: Diagnosis not present

## 2014-11-22 DIAGNOSIS — R293 Abnormal posture: Secondary | ICD-10-CM | POA: Diagnosis not present

## 2014-11-23 DIAGNOSIS — M545 Low back pain: Secondary | ICD-10-CM | POA: Diagnosis not present

## 2014-11-23 DIAGNOSIS — M6281 Muscle weakness (generalized): Secondary | ICD-10-CM | POA: Diagnosis not present

## 2014-11-23 DIAGNOSIS — M2560 Stiffness of unspecified joint, not elsewhere classified: Secondary | ICD-10-CM | POA: Diagnosis not present

## 2014-11-23 DIAGNOSIS — R293 Abnormal posture: Secondary | ICD-10-CM | POA: Diagnosis not present

## 2014-11-25 DIAGNOSIS — M2560 Stiffness of unspecified joint, not elsewhere classified: Secondary | ICD-10-CM | POA: Diagnosis not present

## 2014-11-25 DIAGNOSIS — M545 Low back pain: Secondary | ICD-10-CM | POA: Diagnosis not present

## 2014-11-25 DIAGNOSIS — M6281 Muscle weakness (generalized): Secondary | ICD-10-CM | POA: Diagnosis not present

## 2014-11-25 DIAGNOSIS — R293 Abnormal posture: Secondary | ICD-10-CM | POA: Diagnosis not present

## 2014-11-28 DIAGNOSIS — M545 Low back pain: Secondary | ICD-10-CM | POA: Diagnosis not present

## 2014-11-28 DIAGNOSIS — M2560 Stiffness of unspecified joint, not elsewhere classified: Secondary | ICD-10-CM | POA: Diagnosis not present

## 2014-11-28 DIAGNOSIS — R293 Abnormal posture: Secondary | ICD-10-CM | POA: Diagnosis not present

## 2014-11-28 DIAGNOSIS — M6281 Muscle weakness (generalized): Secondary | ICD-10-CM | POA: Diagnosis not present

## 2014-11-30 DIAGNOSIS — R293 Abnormal posture: Secondary | ICD-10-CM | POA: Diagnosis not present

## 2014-11-30 DIAGNOSIS — M2560 Stiffness of unspecified joint, not elsewhere classified: Secondary | ICD-10-CM | POA: Diagnosis not present

## 2014-11-30 DIAGNOSIS — M6281 Muscle weakness (generalized): Secondary | ICD-10-CM | POA: Diagnosis not present

## 2014-11-30 DIAGNOSIS — M545 Low back pain: Secondary | ICD-10-CM | POA: Diagnosis not present

## 2014-12-02 DIAGNOSIS — R293 Abnormal posture: Secondary | ICD-10-CM | POA: Diagnosis not present

## 2014-12-02 DIAGNOSIS — M2560 Stiffness of unspecified joint, not elsewhere classified: Secondary | ICD-10-CM | POA: Diagnosis not present

## 2014-12-02 DIAGNOSIS — M545 Low back pain: Secondary | ICD-10-CM | POA: Diagnosis not present

## 2014-12-02 DIAGNOSIS — M6281 Muscle weakness (generalized): Secondary | ICD-10-CM | POA: Diagnosis not present

## 2014-12-05 ENCOUNTER — Other Ambulatory Visit: Payer: Self-pay

## 2014-12-05 ENCOUNTER — Other Ambulatory Visit: Payer: Self-pay | Admitting: *Deleted

## 2014-12-05 DIAGNOSIS — M2560 Stiffness of unspecified joint, not elsewhere classified: Secondary | ICD-10-CM | POA: Diagnosis not present

## 2014-12-05 DIAGNOSIS — R293 Abnormal posture: Secondary | ICD-10-CM | POA: Diagnosis not present

## 2014-12-05 DIAGNOSIS — Z1231 Encounter for screening mammogram for malignant neoplasm of breast: Secondary | ICD-10-CM

## 2014-12-05 DIAGNOSIS — M6281 Muscle weakness (generalized): Secondary | ICD-10-CM | POA: Diagnosis not present

## 2014-12-05 DIAGNOSIS — E039 Hypothyroidism, unspecified: Secondary | ICD-10-CM

## 2014-12-05 DIAGNOSIS — E559 Vitamin D deficiency, unspecified: Secondary | ICD-10-CM

## 2014-12-05 DIAGNOSIS — Z0000 Encounter for general adult medical examination without abnormal findings: Secondary | ICD-10-CM

## 2014-12-05 DIAGNOSIS — M545 Low back pain: Secondary | ICD-10-CM | POA: Diagnosis not present

## 2014-12-07 DIAGNOSIS — M47816 Spondylosis without myelopathy or radiculopathy, lumbar region: Secondary | ICD-10-CM | POA: Diagnosis not present

## 2014-12-07 DIAGNOSIS — M545 Low back pain: Secondary | ICD-10-CM | POA: Diagnosis not present

## 2014-12-12 DIAGNOSIS — R293 Abnormal posture: Secondary | ICD-10-CM | POA: Diagnosis not present

## 2014-12-12 DIAGNOSIS — M545 Low back pain: Secondary | ICD-10-CM | POA: Diagnosis not present

## 2014-12-12 DIAGNOSIS — M2560 Stiffness of unspecified joint, not elsewhere classified: Secondary | ICD-10-CM | POA: Diagnosis not present

## 2014-12-12 DIAGNOSIS — M6281 Muscle weakness (generalized): Secondary | ICD-10-CM | POA: Diagnosis not present

## 2014-12-27 ENCOUNTER — Ambulatory Visit
Admission: RE | Admit: 2014-12-27 | Discharge: 2014-12-27 | Disposition: A | Discharge status: Home / Self Care | Payer: Medicare Other | Source: Ambulatory Visit | Attending: Family Medicine | Admitting: Family Medicine

## 2014-12-27 ENCOUNTER — Ambulatory Visit
Admission: RE | Admit: 2014-12-27 | Discharge: 2014-12-27 | Disposition: A | Discharge status: Home / Self Care | Payer: Medicare Other | Source: Ambulatory Visit

## 2014-12-27 ENCOUNTER — Other Ambulatory Visit: Payer: Medicare Other

## 2014-12-27 ENCOUNTER — Ambulatory Visit: Payer: Medicare Other

## 2014-12-27 DIAGNOSIS — Z780 Asymptomatic menopausal state: Secondary | ICD-10-CM | POA: Diagnosis not present

## 2014-12-27 DIAGNOSIS — E559 Vitamin D deficiency, unspecified: Secondary | ICD-10-CM

## 2014-12-27 DIAGNOSIS — E039 Hypothyroidism, unspecified: Secondary | ICD-10-CM

## 2014-12-27 DIAGNOSIS — Z13820 Encounter for screening for osteoporosis: Secondary | ICD-10-CM | POA: Diagnosis not present

## 2014-12-27 DIAGNOSIS — Z0000 Encounter for general adult medical examination without abnormal findings: Secondary | ICD-10-CM

## 2014-12-27 DIAGNOSIS — Z1231 Encounter for screening mammogram for malignant neoplasm of breast: Secondary | ICD-10-CM | POA: Diagnosis not present

## 2015-01-23 ENCOUNTER — Encounter: Payer: Self-pay | Admitting: Family Medicine

## 2015-01-23 ENCOUNTER — Ambulatory Visit (INDEPENDENT_AMBULATORY_CARE_PROVIDER_SITE_OTHER): Payer: Medicare Other | Admitting: Family Medicine

## 2015-01-23 VITALS — BP 128/78 | HR 72 | Temp 97.6°F | Wt 184.4 lb

## 2015-01-23 DIAGNOSIS — R1032 Left lower quadrant pain: Secondary | ICD-10-CM | POA: Diagnosis not present

## 2015-01-23 DIAGNOSIS — M7062 Trochanteric bursitis, left hip: Secondary | ICD-10-CM | POA: Diagnosis not present

## 2015-01-23 DIAGNOSIS — M7061 Trochanteric bursitis, right hip: Secondary | ICD-10-CM | POA: Diagnosis not present

## 2015-01-23 MED ORDER — TRIAMCINOLONE ACETONIDE 40 MG/ML IJ SUSP
20.0000 mg | Freq: Once | INTRAMUSCULAR | Status: AC
  Administered 2015-01-23: 20 mg via INTRAMUSCULAR

## 2015-01-23 MED ORDER — LIDOCAINE HCL (PF) 2 % IJ SOLN
1.5000 mL | Freq: Once | INTRAMUSCULAR | Status: AC
  Administered 2015-01-23: 1.5 mL

## 2015-01-23 NOTE — Patient Instructions (Signed)
Abdominal pain--I can't rule out the possibility of a very early/small hernia (weakening of the abdominal wall).   Try heat/stretches (in case it is more muscular) as needed; return for re-evaluation if increasing pain, swelling, vomiting, change in bowels, fever, or other concerns.  Weight loss is recommended.  Hip Bursitis Bursitis is a swelling and soreness (inflammation) of a fluid-filled sac (bursa). This sac overlies and protects the joints.  CAUSES   Injury.  Overuse of the muscles surrounding the joint.  Arthritis.  Gout.  Infection.  Cold weather.  Inadequate warm-up and conditioning prior to activities. The cause may not be known.  SYMPTOMS   Mild to severe irritation.  Tenderness and swelling over the outside of the hip.  Pain with motion of the hip.  If the bursa becomes infected, a fever may be present. Redness, tenderness, and warmth will develop over the hip. Symptoms usually lessen in 3 to 4 weeks with treatment, but can come back. TREATMENT If conservative treatment does not work, your caregiver may advise draining the bursa and injecting cortisone into the area. This may speed up the healing process. This may also be used as an initial treatment of choice. HOME CARE INSTRUCTIONS   Apply ice to the affected area for 15-20 minutes every 3 to 4 hours while awake for the first 2 days. Put the ice in a plastic bag and place a towel between the bag of ice and your skin.  Rest the painful joint as much as possible, but continue to put the joint through a normal range of motion at least 4 times per day. When the pain lessens, begin normal, slow movements and usual activities to help prevent stiffness of the hip.  Only take over-the-counter or prescription medicines for pain, discomfort, or fever as directed by your caregiver.  Use crutches to limit weight bearing on the hip joint, if advised.  Elevate your painful hip to reduce swelling. Use pillows for propping  and cushioning your legs and hips.  Gentle massage may provide comfort and decrease swelling. SEEK IMMEDIATE MEDICAL CARE IF:   Your pain increases even during treatment, or you are not improving.  You have a fever.  You have heat and inflammation over the involved bursa.  You have any other questions or concerns. MAKE SURE YOU:   Understand these instructions.  Will watch your condition.  Will get help right away if you are not doing well or get worse. Document Released: 01/25/2002 Document Revised: 10/28/2011 Document Reviewed: 08/24/2008 [facility] Patient Information 2015 [place], [place]. This information is not intended to replace advice given to you by your health care provider. Make sure you discuss any questions you have with your health care provider.

## 2015-01-23 NOTE — Progress Notes (Signed)
Chief Complaint  Patient presents with  . sharp stomach pain    sharp pain in stomach on left side and then shot both legs for the bursa   She is complaining of pain to the left of her umbilicus "for a good while"--at least 6 months.  She finds it more noticeable/bothersome than initially. She "grabs" it when she first wakes up in the morning--when she is up and about, usually aware of it when she is making her coffee in the morning.  She also has discomfort periodically throughout the day she finds herself holding the area.  Denies nausea, vomiting.  Bowels are stable--half the time she is constipated, then takes something and they are very loose, requiring probiotics to get it back normal again.  Denies change in her diet.  Denies feeling gassy, bloated.  Last bowel movement was this morning--slightly hard at first, finished somewhat loose.  Currently doesn't have the pain--only notices it if she presses on the area.  Pain doesn't seem related to her bowels or eating.  Patient also presents for treatment of bilateral trochanteric bursitis. She has had recurrent pain over the last several weeks.  She has tried Tylenol and Aleve without benefit.  Denies any change in activity (other than changing to summer shoes).   She is aware of the risks, benefits and potential alternatives. She has previously tried NSAIDs without relief, and has had good relief from prior injection.  Last bilateral injections was 08/04/2014, and also had R injected 02/2014.  PMH, [place] SH reviewed.  She went back to work temporarily for 3x/week for a few months  Outpatient Encounter Prescriptions as of 01/23/2015  Medication Sig Note  . Calcium-Vitamin D (CALTRATE 600 PLUS-VIT D PO) Take 1 tablet by mouth daily.   07/27/2014: Takes daily  . Cholecalciferol (VITAMIN D) 1000 UNITS capsule Take 1,000 Units by mouth daily.     . clobetasol cream (TEMOVATE) 0.05 % Apply topically 2 (two) times daily as needed. Use prn for psoriasis; use  sparingly   . co-enzyme Q-10 30 MG capsule Take 100 mg by mouth daily.     . Cyanocobalamin (VITAMIN B 12 PO) Take 1 tablet by mouth.     . fish oil-omega-3 fatty acids 1000 MG capsule Take 1.2 g by mouth daily.   Marland Kitchen levothyroxine (SYNTHROID, LEVOTHROID) 88 MCG tablet Take 1 tablet (88 mcg total) by mouth daily.   . Multiple Vitamins-Minerals (ALIVE WOMENS 50+ PO) Take by mouth.   Marland Kitchen NEXIUM 40 MG capsule Take 1 capsule (40 mg total) by mouth daily.   . Red Yeast Rice Extract (RED YEAST RICE PO) Take 4 tablets by mouth daily.     . [DISCONTINUED] nitrofurantoin, macrocrystal-monohydrate, (MACROBID) 100 MG capsule Take 1 capsule (100 mg total) by mouth 2 (two) times daily.   . [DISCONTINUED] Probiotic Product (ALIGN) 4 MG CAPS Take 1 capsule by mouth at bedtime.   . [EXPIRED] lidocaine (XYLOCAINE) 2 % injection 1.5 mL    . [EXPIRED] lidocaine (XYLOCAINE) 2 % injection 1.5 mL    . [EXPIRED] triamcinolone acetonide (KENALOG-40) injection 20 mg    . [EXPIRED] triamcinolone acetonide (KENALOG-40) injection 20 mg     No facility-administered encounter medications on file as of 01/23/2015.   Allergies  Allergen Reactions  . Ciprofloxacin     After she takes for a while it gives her a "funny feeling."  . Codeine Nausea Only  . Iodine Swelling  . Polysporin [Bacitracin-Polymyxin B] Swelling    Used for  eye infection--got redder and worse  . Sulfa Antibiotics Swelling    Lips swell, itching  . Penicillins Rash   ROS:  No fevers, chills, bleeding, bruising, rash, swelling, chest pain, headaches, dizziness.  +abdominal discomfort as per HPI. No nausea, vomiting, bowel changes.  PHYSICAL EXAM: BP 128/78 mmHg  Pulse 72  Temp(Src) 97.6 F (36.4 C)  Wt 184 lb 6.4 oz (83.643 kg)  Well developed, pleasant female in no distress.  She appeared to have slight discomfort and hold on to her abdomen when changing positions (lying to sitting, etc) Neck: no lymphadenopathy or mass Heart: regular rate and  rhythm without murmur Lungs: clear bilaterally Back: no CVA tenderness Abdomen: soft, normal bowel sounds. No organomegaly or mass.  She has tenderness just to the left of the umbilicus--tender on deep palpation, but no appreciable mass.  With contraction of abdominal muscles, there is some asymmetry between the right and left noted, tender on the left.  No discrete mass or bulge.  Soft when muscles are relaxed. Extremities: no edema.  Tender over trochanteric bursa bilaterally Skin: bruising, rashes Neuro: alert and oriented. Normal strength, gait Psych: normal mood, affect, hygiene and grooming  Procedure: Area was cleansed with alcohol. Ethyl chloride spray was applied, then trochanteric bursa was injected with 46m of kenalog, and 1.5 cc of 2% lidocaine without epi.  The same procedure was done bilaterally. She tolerated both without any complication, and had immediate improvement in pain.  ASSESSMENT/PLAN:  Greater trochanteric bursitis of both hips - Plan: triamcinolone acetonide (KENALOG-40) injection 20 mg, triamcinolone acetonide (KENALOG-40) injection 20 mg, lidocaine (XYLOCAINE) 2 % injection 1.5 mL, lidocaine (XYLOCAINE) 2 % injection 1.5 mL, PR DRAIN/INJECT LARGE JOINT/BURSA  Left lower quadrant pain - muscle strain vs weakness in wall/early hernia. s/sx of red flags reviewed  Abdominal pain--focal area on the left.  Consider MSK etiology, vs weakening of the abdominal wall with very slight hernia--definitely reducible and of no concern.  Recommended heat/stretches as needed; return for re-evaluation (would need imaging) if increasing pain, swelling, vomiting, change in bowels, fever, or other concerns.  Weight loss is recommended.

## 2015-02-07 DIAGNOSIS — Z124 Encounter for screening for malignant neoplasm of cervix: Secondary | ICD-10-CM | POA: Diagnosis not present

## 2015-02-07 LAB — HM PAP SMEAR: HM Pap smear: NORMAL

## 2015-02-09 ENCOUNTER — Encounter: Payer: Self-pay | Admitting: *Deleted

## 2015-04-05 ENCOUNTER — Ambulatory Visit (INDEPENDENT_AMBULATORY_CARE_PROVIDER_SITE_OTHER): Payer: Medicare Other | Admitting: Family Medicine

## 2015-04-05 ENCOUNTER — Encounter: Payer: Self-pay | Admitting: Family Medicine

## 2015-04-05 VITALS — BP 174/82 | HR 60 | Ht 65.0 in | Wt 187.4 lb

## 2015-04-05 DIAGNOSIS — M549 Dorsalgia, unspecified: Secondary | ICD-10-CM

## 2015-04-05 DIAGNOSIS — G8929 Other chronic pain: Secondary | ICD-10-CM

## 2015-04-05 DIAGNOSIS — R030 Elevated blood-pressure reading, without diagnosis of hypertension: Secondary | ICD-10-CM | POA: Diagnosis not present

## 2015-04-05 DIAGNOSIS — E669 Obesity, unspecified: Secondary | ICD-10-CM | POA: Diagnosis not present

## 2015-04-05 DIAGNOSIS — K219 Gastro-esophageal reflux disease without esophagitis: Secondary | ICD-10-CM

## 2015-04-05 DIAGNOSIS — IMO0001 Reserved for inherently not codable concepts without codable children: Secondary | ICD-10-CM

## 2015-04-05 NOTE — Progress Notes (Signed)
Chief Complaint  Patient presents with  . Hypertension    last Thursday went to dentist and bp was 180's/90's. Has been checking at home twice daily getting numbers 140'-160's/80's-90-which is still high for her. HAs-has had a dull headache for several. Has not been sleeping well over the last couple weeks-took advil PM last night.   . Advice Only    what is your recommendation for vitamin D intake for her.    She has had a dull headache, which she attributes to the heat.  She went to the dentist for a routine cleaning, and found her BP to be high, even when repeated (lower on repeat, but still high).  Since then she has been monitoring it at least 2x/day, mostly in the 150's/mid-upper 80's, over 90 twice.    She has some low back pain, seeing orthopedist.  He prescribed something for pain, an anti-inflammatory (she took it for 3 months), weaned herself off of it.  Currently has 5/10 pain. She has some stress, but not significantly different than usual (stress from upcoming trip, no major problems). Eating more tomatoes recently, and salts them some. Otherwise denies any change in sodium intake--no canned foods  She has been doing some heavy cleaning, no other regular exercise.  Denies any chest pain or "indigestion" related to activity.  She has been having a little more frequent reflux than normal, needing some Tums occasionally in addition to her Nexium.  Tums is effective. Has been eating more tomatoes.  PMH, PSH, SH reviewed.  Outpatient Encounter Prescriptions as of 04/05/2015  Medication Sig Note  . Calcium-Vitamin D (CALTRATE 600 PLUS-VIT D PO) Take 1 tablet by mouth daily.   07/27/2014: Takes daily  . Cholecalciferol (VITAMIN D) 1000 UNITS capsule Take 1,000 Units by mouth daily.     . clobetasol cream (TEMOVATE) 0.05 % Apply topically 2 (two) times daily as needed. Use prn for psoriasis; use sparingly   . Cyanocobalamin (VITAMIN B 12 PO) Take 1 tablet by mouth.     . fish oil-omega-3  fatty acids 1000 MG capsule Take 1.2 g by mouth daily.   . Ibuprofen-Diphenhydramine HCl 200-25 MG CAPS Take 2 tablets by mouth as needed. 04/05/2015: Took one last night  . levothyroxine (SYNTHROID, LEVOTHROID) 88 MCG tablet Take 1 tablet (88 mcg total) by mouth daily.   . Multiple Vitamins-Minerals (ALIVE WOMENS 50+ PO) Take by mouth.   Marland Kitchen NEXIUM 40 MG capsule Take 1 capsule (40 mg total) by mouth daily.   . Red Yeast Rice Extract (RED YEAST RICE PO) Take 4 tablets by mouth daily.     Marland Kitchen co-enzyme Q-10 30 MG capsule Take 100 mg by mouth daily.     . [DISCONTINUED] etodolac (LODINE) 400 MG tablet Take 400 mg by mouth.  04/05/2015: Received from: External Pharmacy   No facility-administered encounter medications on file as of 04/05/2015.   ROS: no fever, chills, dizziness, URI symptoms, shortness of breath, cough, chest pain.  +indigestion, dull headache. No edema, urinary complaints, bleeding, bruising, rash or any other complaints.  PHYSICAL EXAM: BP 162/86 mmHg  Pulse 60  Ht 5' 5"  (1.651 m)  Wt 187 lb 6.4 oz (85.004 kg)  BMI 31.18 kg/m2  174/82 on repeat by MD Well developed, pleasant female, appears mildly anxious, in no distress HEENT: PERRL, EOMI, conjunctiva clear. OP clear Neck: No lymphadenopathy, thyromegaly or mass Heart: regular rate and rhythm, no murmur Lungs: clear bilaterally Abdomen: soft, nontender, no mass Extremities: no edema, normal pulses Psych:  normal mood, affect, hygiene and grooming.  Very mildly anxious appearing.  Normal speech, eye contact Neuro: alert and oriented. Cranial nerves intact. Normal strength, gait.  EKG: NSR, rate 56 (sinus bradycardia); borderline LAE noted. Unchanged from 07/2014 (which was noted to be unchanged from prior EKG).  ASSESSMENT/PLAN:  Elevated blood pressure - low sodium diet, daily exercise, weight loss; continue monitoring and f/u in 2 weeks.  bring monitor and list to visit  Gastroesophageal reflux disease, esophagitis  presence not specified - diet reviewed; increase in tomatoes may be contributing to more frequent symptoms. EKG unchanged, reassured.  Obesity (BMI 30-39.9)  Chronic back pain - may be contributing to higher blood pressures. Discussed treating pain, keeping log of BP and pain symptoms   Counseled re: low sodium diet, causes of elevated blood pressure include pain, anxiety, medications, sodium. Check BP once or twice daily (less often if creating anxiety). Please keep a list of blood pressures with columns for the date, blood pressure, pulse and "comments"--high salt food, pain level, didn't sleep well, headache, etc.  Bring this list and your blood pressure monitor to your next visit.  Answered vitamin D questions.  Normal value of 50 in 12/2010

## 2015-04-05 NOTE — Patient Instructions (Addendum)
Counseled re: low sodium diet, causes of elevated blood pressure include pain, anxiety, medications, sodium. Check BP once or twice daily (less often if creating anxiety). Please keep a list of blood pressures with columns for the date, blood pressure, pulse and "comments"--high salt food, pain level, didn't sleep well, headache, etc.  Bring this list and your blood pressure monitor to your next visit.  Weight loss is encouraged, as is daily exercise. It is recommended that you get at least 30 minutes of aerobic exercise at least 5 days/week (for weight loss, you may need as much as 60-90 minutes). This can be any activity that gets your heart rate up. This can be divided in 10-15 minute intervals if needed, but try and build up your endurance at least once a week.  Weight bearing exercise is also recommended twice weekly.  Your recent worsening of indigestion may be related to your increased tomato intake.  See information below.   Low-Sodium Eating Plan Sodium raises blood pressure and causes water to be held in the body. Getting less sodium from food will help lower your blood pressure, reduce any swelling, and protect your heart, liver, and kidneys. We get sodium by adding salt (sodium chloride) to food. Most of our sodium comes from canned, boxed, and frozen foods. Restaurant foods, fast foods, and pizza are also very high in sodium. Even if you take medicine to lower your blood pressure or to reduce fluid in your body, getting less sodium from your food is important. WHAT IS MY PLAN? Most people should limit their sodium intake to 2,300 mg a day. Your health care provider recommends that you limit your sodium intake to __________ a day.  WHAT DO I NEED TO KNOW ABOUT THIS EATING PLAN? For the low-sodium eating plan, you will follow these general guidelines:  Choose foods with a % Daily Value for sodium of less than 5% (as listed on the food label).   Use salt-free seasonings or herbs  instead of table salt or sea salt.   Check with your health care provider or pharmacist before using salt substitutes.   Eat fresh foods.  Eat more vegetables and fruits.  Limit canned vegetables. If you do use them, rinse them well to decrease the sodium.   Limit cheese to 1 oz (28 g) per day.   Eat lower-sodium products, often labeled as "lower sodium" or "no salt added."  Avoid foods that contain monosodium glutamate (MSG). MSG is sometimes added to [place] food and some canned foods.  Check food labels (Nutrition Facts labels) on foods to learn how much sodium is in one serving.  Eat more home-cooked food and less restaurant, buffet, and fast food.  When eating at a restaurant, ask that your food be prepared with less salt or none, if possible.  HOW DO I READ FOOD LABELS FOR SODIUM INFORMATION? The Nutrition Facts label lists the amount of sodium in one serving of the food. If you eat more than one serving, you must multiply the listed amount of sodium by the number of servings. Food labels may also identify foods as:  Sodium free--Less than 5 mg in a serving.  Very low sodium--35 mg or less in a serving.  Low sodium--140 mg or less in a serving.  Light in sodium--50% less sodium in a serving. For example, if a food that usually has 300 mg of sodium is changed to become light in sodium, it will have 150 mg of sodium.  Reduced sodium--25%  less sodium in a serving. For example, if a food that usually has 400 mg of sodium is changed to reduced sodium, it will have 300 mg of sodium. WHAT FOODS CAN I EAT? Grains Low-sodium cereals, including oats, puffed wheat and rice, and shredded wheat cereals. Low-sodium crackers. Unsalted rice and pasta. Lower-sodium bread.  Vegetables Frozen or fresh vegetables. Low-sodium or reduced-sodium canned vegetables. Low-sodium or reduced-sodium tomato sauce and paste. Low-sodium or reduced-sodium tomato and vegetable juices.   Fruits Fresh, frozen, and canned fruit. Fruit juice.  Meat and Other Protein Products Low-sodium canned tuna and salmon. Fresh or frozen meat, poultry, seafood, and fish. Lamb. Unsalted nuts. Dried beans, peas, and lentils without added salt. Unsalted canned beans. Homemade soups without salt. Eggs.  Dairy Milk. Soy milk. Ricotta cheese. Low-sodium or reduced-sodium cheeses. Yogurt.  Condiments Fresh and dried herbs and spices. Salt-free seasonings. Onion and garlic powders. Low-sodium varieties of mustard and ketchup. Lemon juice.  Fats and Oils Reduced-sodium salad dressings. Unsalted butter.  Other Unsalted popcorn and pretzels.  The items listed above may not be a complete list of recommended foods or beverages. Contact your dietitian for more options. WHAT FOODS ARE NOT RECOMMENDED? Grains Instant hot cereals. Bread stuffing, pancake, and biscuit mixes. Croutons. Seasoned rice or pasta mixes. Noodle soup cups. Boxed or frozen macaroni and cheese. Self-rising flour. Regular salted crackers. Vegetables Regular canned vegetables. Regular canned tomato sauce and paste. Regular tomato and vegetable juices. Frozen vegetables in sauces. Salted french fries. Olives. Angie Fava. Relishes. Sauerkraut. Salsa. Meat and Other Protein Products Salted, canned, smoked, spiced, or pickled meats, seafood, or fish. Bacon, ham, sausage, hot dogs, corned beef, chipped beef, and packaged luncheon meats. Salt pork. Jerky. Pickled herring. Anchovies, regular canned tuna, and sardines. Salted nuts. Dairy Processed cheese and cheese spreads. Cheese curds. Blue cheese and cottage cheese. Buttermilk.  Condiments Onion and garlic salt, seasoned salt, table salt, and sea salt. Canned and packaged gravies. Worcestershire sauce. Tartar sauce. Barbecue sauce. Teriyaki sauce. Soy sauce, including reduced sodium. Steak sauce. Fish sauce. Oyster sauce. Cocktail sauce. Horseradish. Regular ketchup and mustard.  Meat flavorings and tenderizers. Bouillon cubes. Hot sauce. Tabasco sauce. Marinades. Taco seasonings. Relishes. Fats and Oils Regular salad dressings. Salted butter. Margarine. Ghee. Bacon fat.  Other Potato and tortilla chips. Corn chips and puffs. Salted popcorn and pretzels. Canned or dried soups. Pizza. Frozen entrees and pot pies.  The items listed above may not be a complete list of foods and beverages to avoid. Contact your dietitian for more information. Document Released: 01/25/2002 Document Revised: 08/10/2013 Document Reviewed: 06/09/2013 [facility] Patient Information 2015 [place], [place]. This information is not intended to replace advice given to you by your health care provider. Make sure you discuss any questions you have with your health care provider.  Food Choices for Gastroesophageal Reflux Disease When you have gastroesophageal reflux disease (GERD), the foods you eat and your eating habits are very important. Choosing the right foods can help ease the discomfort of GERD. WHAT GENERAL GUIDELINES DO I NEED TO FOLLOW?  Choose fruits, vegetables, whole grains, low-fat dairy products, and low-fat meat, fish, and poultry.  Limit fats such as oils, salad dressings, butter, nuts, and avocado.  Keep a food diary to identify foods that cause symptoms.  Avoid foods that cause reflux. These may be different for different people.  Eat frequent small meals instead of three large meals each day.  Eat your meals slowly, in a relaxed setting.  Limit fried foods.  Cook foods  using methods other than frying.  Avoid drinking alcohol.  Avoid drinking large amounts of liquids with your meals.  Avoid bending over or lying down until 2-3 hours after eating. WHAT FOODS ARE NOT RECOMMENDED? The following are some foods and drinks that may worsen your symptoms: Vegetables Tomatoes. Tomato juice. Tomato and spaghetti sauce. Chili peppers. Onion and garlic.  Horseradish. Fruits Oranges, grapefruit, and lemon (fruit and juice). Meats High-fat meats, fish, and poultry. This includes hot dogs, ribs, ham, sausage, salami, and bacon. Dairy Whole milk and chocolate milk. Sour cream. Cream. Butter. Ice cream. Cream cheese.  Beverages Coffee and tea, with or without caffeine. Carbonated beverages or energy drinks. Condiments Hot sauce. Barbecue sauce.  Sweets/Desserts Chocolate and cocoa. Donuts. Peppermint and spearmint. Fats and Oils High-fat foods, including [place] fries and potato chips. Other Vinegar. Strong spices, such as black pepper, white pepper, red pepper, cayenne, curry powder, cloves, ginger, and chili powder. The items listed above may not be a complete list of foods and beverages to avoid. Contact your dietitian for more information. Document Released: 08/05/2005 Document Revised: 08/10/2013 Document Reviewed: 06/09/2013 [employer] Patient Information 2015 [place], [place]. This information is not intended to replace advice given to you by your health care provider. Make sure you discuss any questions you have with your health care provider.

## 2015-04-26 ENCOUNTER — Ambulatory Visit (INDEPENDENT_AMBULATORY_CARE_PROVIDER_SITE_OTHER): Payer: Medicare Other | Admitting: Family Medicine

## 2015-04-26 ENCOUNTER — Encounter: Payer: Self-pay | Admitting: Family Medicine

## 2015-04-26 VITALS — BP 150/84 | HR 68 | Temp 98.3°F | Ht 65.0 in | Wt 187.4 lb

## 2015-04-26 DIAGNOSIS — IMO0001 Reserved for inherently not codable concepts without codable children: Secondary | ICD-10-CM

## 2015-04-26 DIAGNOSIS — R519 Headache, unspecified: Secondary | ICD-10-CM

## 2015-04-26 DIAGNOSIS — R040 Epistaxis: Secondary | ICD-10-CM

## 2015-04-26 DIAGNOSIS — R1032 Left lower quadrant pain: Secondary | ICD-10-CM

## 2015-04-26 DIAGNOSIS — M545 Low back pain: Secondary | ICD-10-CM

## 2015-04-26 DIAGNOSIS — R51 Headache: Secondary | ICD-10-CM | POA: Diagnosis not present

## 2015-04-26 DIAGNOSIS — R030 Elevated blood-pressure reading, without diagnosis of hypertension: Secondary | ICD-10-CM | POA: Diagnosis not present

## 2015-04-26 DIAGNOSIS — K219 Gastro-esophageal reflux disease without esophagitis: Secondary | ICD-10-CM

## 2015-04-26 NOTE — Patient Instructions (Addendum)
Please call us and let us know the date that you get your flu shot (from your former place of employment).  Continue to try and exercise daily.  Feel free to do an extra check about 15-20 minutes after exercise to see if it comes down (and comment "after exercise"). Continue to limit salt in your diet. Try and rate your level of pain in the comments on your blood pressure sheet (ie 6/10 vs 2/10).   I suspect that part of the nose bleeding/bloody mucus is related to being on anti-inflammatory medications.  Stop taking Aleve for 2 weeks, using Tylenol Arthritis instead (also don't take any ibuprofen or aspirin).  I suspect the bleeding will stop. I do NOT see evidence of a sinus infection.  You can try doing sinus rinses to help with the sinus pressure behind the eyes (Sinus Rinse Kit or Neti-pot).  You can also try taking Mucinex regularly (plain kind) to help keep the mucus thin so that it drains from the sinuses easier.   Try using simethicone (Gas-X) over the next few days to see if this helps with your abdominal symptoms. Also try and avoid foods which might be gas producting (raw vegetables like broccoli, beans, and avoid dairy as well as a trial). If you continue to have pain, we may need to refer you to a gastroenterologist (back to Dr. Collene Mares).   Try switching the Nexium to taking it at night.  If that doesn't help control your heartburn, then you might need to take it twice daily for a few weeks to a month, and then see if you are able to go back down to once daily.   Food Choices for Gastroesophageal Reflux Disease When you have gastroesophageal reflux disease (GERD), the foods you eat and your eating habits are very important. Choosing the right foods can help ease the discomfort of GERD. WHAT GENERAL GUIDELINES DO I NEED TO FOLLOW?  Choose fruits, vegetables, whole grains, low-fat dairy products, and low-fat meat, fish, and poultry.  Limit fats such as oils, salad dressings,  butter, nuts, and avocado.  Keep a food diary to identify foods that cause symptoms.  Avoid foods that cause reflux. These may be different for different people.  Eat frequent small meals instead of three large meals each day.  Eat your meals slowly, in a relaxed setting.  Limit fried foods.  Cook foods using methods other than frying.  Avoid drinking alcohol.  Avoid drinking large amounts of liquids with your meals.  Avoid bending over or lying down until 2-3 hours after eating. WHAT FOODS ARE NOT RECOMMENDED? The following are some foods and drinks that may worsen your symptoms: Vegetables Tomatoes. Tomato juice. Tomato and spaghetti sauce. Chili peppers. Onion and garlic. Horseradish. Fruits Oranges, grapefruit, and lemon (fruit and juice). Meats High-fat meats, fish, and poultry. This includes hot dogs, ribs, ham, sausage, salami, and bacon. Dairy Whole milk and chocolate milk. Sour cream. Cream. Butter. Ice cream. Cream cheese.  Beverages Coffee and tea, with or without caffeine. Carbonated beverages or energy drinks. Condiments Hot sauce. Barbecue sauce.  Sweets/Desserts Chocolate and cocoa. Donuts. Peppermint and spearmint. Fats and Oils High-fat foods, including [place] fries and potato chips. Other Vinegar. Strong spices, such as black pepper, white pepper, red pepper, cayenne, curry powder, cloves, ginger, and chili powder. The items listed above may not be a complete list of foods and beverages to avoid. Contact your dietitian for more information. Document Released: 08/05/2005 Document Revised: 08/10/2013 Document Reviewed: 06/09/2013  [employer] Patient Information 2015 [place]. This information is not intended to replace advice given to you by your health care provider. Make sure you discuss any questions you have with your health care provider.

## 2015-04-26 NOTE — Addendum Note (Signed)
Addended by: Carolee Rota F on: 04/26/2015 09:44 AM   Modules accepted: Orders

## 2015-04-26 NOTE — Progress Notes (Signed)
Chief Complaint  Patient presents with  . Follow-up    on blood pressure. Has list with her today. Also wants to know if while she is here you can set up some type of scan for her ongoing abdominal pain-states that you said you could do this if pain hadn't resolved.   . Facial Pain    and headaches. Thinks she may have a sinus infection. Has blood-streaked mucus x 2-3 weeks. No known fevers.Will get flu shot at work.    She was seen 8/17 with elevated BP at the dentist.  We spoke about daily exercise, low sodium diet, and monitoring BP's at home. She returns today to follow up on these blood pressures.  She has cut back on salt in her diet (no longer adding salt to salad), not eating any prepared meats, has had some chips. She has been cutting shrubs for the last 2 days.  She walked daily when at the beach for a week. Her list of BP's was reviewed--and comments state that she had headache or stress or back pain most of the days that her BP was checked.  These BP's were ranging [phone]. Only twice did she report feeling "relaxed" and BP was 120/70, 129/66.  This morning at home her BP was 130/74, yesterday at lunch 129/71 but earlier that morning it was 146/78. She reported many headaches, which she thinks is related to sinuses.  This started bothering her just shortly after her last visit her, and ongoing.  The mucus is bloody, not yellow/green.  The headaches have been behind her eyes, but also has posterior headaches which she relates to "tension"/stress.  Denies any particular stressors.  Posterior headaches are most days.  No fever or chills. Sinus headaches are daily.  She has been using Ocean nasal saline spray.  Mucus is bloody, not particularly thick.  +sneezing, postnasal drainage. Phlegm she coughs up is a very light yellow, mostly in the morning, not as much during the day if she drinks a lot of water.  Indigestion and reflux was more frequent at her last visit a few weeks ago, needing  some Tums in addition to her daily nexium.  She cut out the tomatoes. This morning she had toast and coffee, took her pills with 1/2 cut of diet cranberry juice, and has been belching.  Increased heartburn/reflux has continued.  At her visit in June we discussed sharp pains that occur to the left of her umbilicus. She continues to have pain that occurs daily, in the same location, only relieved by grabbing it and it eventually goes away. Not related to eating, exercise, position, bowels (not related to passing gas, constipation, etc).  PMH, [facility] reviewed  Outpatient Encounter Prescriptions as of 04/26/2015  Medication Sig Note  . Calcium-Vitamin D (CALTRATE 600 PLUS-VIT D PO) Take 1 tablet by mouth daily.   07/27/2014: Takes daily  . Cholecalciferol (VITAMIN D) 1000 UNITS capsule Take 1,000 Units by mouth daily.     . clobetasol cream (TEMOVATE) 0.05 % Apply topically 2 (two) times daily as needed. Use prn for psoriasis; use sparingly   . co-enzyme Q-10 30 MG capsule Take 100 mg by mouth daily.     . Cyanocobalamin (VITAMIN B 12 PO) Take 1 tablet by mouth.     . fish oil-omega-3 fatty acids 1000 MG capsule Take 1.2 g by mouth daily.   . Ibuprofen-Diphenhydramine HCl 200-25 MG CAPS Take 2 tablets by mouth as needed. 04/05/2015: Took one last night  .  levothyroxine (SYNTHROID, LEVOTHROID) 88 MCG tablet Take 1 tablet (88 mcg total) by mouth daily.   . Multiple Vitamins-Minerals (ALIVE WOMENS 50+ PO) Take by mouth.   . Naproxen Sodium (ALEVE) 220 MG CAPS Take 1 capsule by mouth 2 (two) times daily.   Marland Kitchen NEXIUM 40 MG capsule Take 1 capsule (40 mg total) by mouth daily.   . Red Yeast Rice Extract (RED YEAST RICE PO) Take 4 tablets by mouth daily.      No facility-administered encounter medications on file as of 04/26/2015.   Taking Aleve twice daily for quite a while.  Allergies  Allergen Reactions  . Ciprofloxacin     After she takes for a while it gives her a "funny feeling."  . Codeine Nausea  Only  . Iodine Swelling  . Polysporin [Bacitracin-Polymyxin B] Swelling    Used for eye infection--got redder and worse  . Sulfa Antibiotics Swelling    Lips swell, itching  . Penicillins Rash   ROS: no fever, chills, ear pain, sore throat, cough, shortness of breath, chest pain, palpitations, nausea, vomiting, bowel changes, bleeding (bloody mucus per HPI).  +bruising on forearms since doing yardwork.  No urinary complaints or concerns other than per HPI  PHYSICAL EXAM: BP 162/84 mmHg  Pulse 68  Temp(Src) 98.3 F (36.8 C) (Tympanic)  Ht 5' 5"  (1.651 m)  Wt 187 lb 6.4 oz (85.004 kg)  BMI 31.18 kg/m2 Nurse BP 162/84 Patient's monitor 168/95  150/84 on repeat by MD, RA  Well developed, pleasant female in no distress Skin: Petechia and ecchymosis on both forearms and upper arms (from yardwork) noed HEENT: PERRL, EOMI, conjunctive is clear, fundi normal.  Nasal mucosa is only minimally edematous, no erythema or purulence.  There is area on the right side along the septum that has multiple areas of recent bleeding. No active bleeding. Sinuses nontender, OP is clear Neck: no lymphadenopathy, thyromegaly or mass Heart: regular rate and rhythm Lungs: clear bilaterally Back: no CVA tenderness Abdomen: soft, nontender, no mass; normal bowel sounds Extremities: no edema, normal pulses Neuro: alert and oriented. Cranial nerves intact; normal strength, sensation, gait Psych: normal mood, affect, hygiene and grooming  Lab Results  Component Value Date   WBC 4.6 05/30/2014   HGB 14.5 05/30/2014   HCT 41.8 05/30/2014   MCV 88.4 05/30/2014   PLT 188 05/30/2014   ASSESSMENT/PLAN:  Headache, unspecified headache type - posterior/muscular.  Refer for PT. Stretches, massage, posture reviewed - Plan: AMB referral to rehabilitation  Low back pain without sciatica, unspecified back pain laterality - Plan: AMB referral to rehabilitation  Elevated blood pressure - continue to monitor, and  document pain level. continue low sodium diet; daily exercise is recommended.  Gastroesophageal reflux disease, esophagitis presence not specified - not adequately controlled with qd Nexium dosing. Increase to 38m BID for 1-4 weeks. diet reviewed.  Nasal bleeding - area of bleeding is noted to be right septal mucosa. Stop NSAIDs; continue saline spray, consider vaseline.  Abdominal pain, left lower quadrant - directly to the left of umbilicus; Ddx reviewed. Trial of Gas-X, dietary changes. consider f/u with Dr. MCollene Maresif persistent/worse   Declines flu shot--gets for free where she used to work. [place] she [place] (in October).  [place].  Continue to try and exercise daily.  Feel free to do an extra check about 15-20 minutes after exercise to see  if it comes down (and comment "after exercise"). Continue to limit salt in your diet. Try and rate your level of pain in the comments on your blood pressure sheet (ie 6/10 vs 2/10).  Try switching the Nexium to taking it at night.  If that doesn't help control your heartburn, then you might need to take it twice daily for a few weeks to a month, and then see if you are able to go back down to once daily.  I suspect that part of the nose bleeding/bloody mucus is related to being on anti-inflammatory medications.  Stop taking Aleve for 2 weeks, using Tylenol Arthritis instead (also don't take any ibuprofen or aspirin).  I suspect the bleeding will stop. I do NOT see evidence of a sinus infection.  You can try doing sinus rinses to help with the sinus pressure behind the eyes (Sinus Rinse Kit or Neti-pot).  You can also try taking Mucinex regularly (plain kind) to help keep the mucus thin so that it drains from the sinuses easier.  Try using simethicone (Gas-X) over the next few days to see if this helps with your abdominal symptoms. Also try and avoid foods  which might be gas producting (raw vegetables like broccoli, beans, and avoid dairy as well as a trial). If you continue to have pain, we may need to refer you to a gastroenterologist (back to Dr. Collene Mares).

## 2015-05-03 ENCOUNTER — Ambulatory Visit (HOSPITAL_COMMUNITY): Payer: Medicare Other | Admitting: Physical Therapy

## 2015-05-03 ENCOUNTER — Encounter: Payer: Self-pay | Admitting: Family Medicine

## 2015-05-03 DIAGNOSIS — R7301 Impaired fasting glucose: Secondary | ICD-10-CM | POA: Insufficient documentation

## 2015-05-04 ENCOUNTER — Ambulatory Visit (HOSPITAL_COMMUNITY): Payer: Medicare Other | Attending: Family Medicine | Admitting: Physical Therapy

## 2015-05-04 DIAGNOSIS — R262 Difficulty in walking, not elsewhere classified: Secondary | ICD-10-CM

## 2015-05-04 DIAGNOSIS — M542 Cervicalgia: Secondary | ICD-10-CM | POA: Diagnosis not present

## 2015-05-04 DIAGNOSIS — M545 Low back pain, unspecified: Secondary | ICD-10-CM

## 2015-05-04 DIAGNOSIS — R6889 Other general symptoms and signs: Secondary | ICD-10-CM

## 2015-05-04 DIAGNOSIS — M533 Sacrococcygeal disorders, not elsewhere classified: Secondary | ICD-10-CM | POA: Diagnosis not present

## 2015-05-04 NOTE — Therapy (Signed)
[place], [place], [address] Phone: [phone]   Fax:  [phone]  Physical Therapy Evaluation  Patient Details  Name: Mckenzie Webb MRN: [id number] Date of Birth: [birth year] Referring Provider:  Rita Ohara, MD  Encounter Date: 05/04/2015      PT End of Session - 05/04/15 1644    Visit Number 1   Number of Visits 12   Date for PT Re-Evaluation 06/01/15   Authorization Type Medicare   Authorization Time Period 05/04/15-07/16/15   Authorization - Visit Number 1   Authorization - Number of Visits 10   PT Start Time 1430   PT Stop Time 0093   PT Time Calculation (min) 44 min   Activity Tolerance Patient tolerated treatment well   Behavior During Therapy [facility] for tasks assessed/performed      Past Medical History  Diagnosis Date  . Psoriasis   . GERD (gastroesophageal reflux disease)   . Allergy   . Interstitial cystitis   . Leukopenia   . Edema   . Hyperlipidemia   . Diabetes mellitus 4/07    pt reports that all subsequent tests were normal, not diabetic  . Obesity   . DJD (degenerative joint disease), cervical 04 2002  . Hypothyroid   . Prediabetes 06/2014    fasting glucose 109; A1c 5.7    Past Surgical History  Procedure Laterality Date  . Cholecystectomy    . Tubal ligation  1983  . Knee arthroscopy Right 1990  . Colonoscopy  3/09  . Varicose vein surgery  09/2009 R, 06/2010 L  . Knee arthroscopy Left 12/12/2010     (Dr. Noemi Chapel)    There were no vitals filed for this visit.  Visit Diagnosis:  Bilateral low back pain without sciatica  Cervicalgia  Difficulty walking  Sacroiliac joint dysfunction of left side  Decreased functional activity tolerance      Subjective Assessment - 05/04/15 1436    Subjective Pt reports that she has been seeing her physician for BP problems for the past 2 months, and her physician believes that a lot of her issues are stemming from pain and  stress. Pt reports that she is experiencing neck and low back pain at this time, with LBP being greater at this time. She has difficulty with lifting, feels that she has a lot of stiffness, and is not able to walk for an extended period of time. Pt reports that prior to LBP, she was doing a lot of walking. She reports that she is experiencing pain in her neck, but she doesn't feel like she is limited by her neck pain.    How long can you sit comfortably? 20-30 minutes   How long can you stand comfortably? 10-20 minutes   How long can you walk comfortably? 10-15 minutes   Patient Stated Goals Get back to walking, decrease pain   Currently in Pain? Yes   Pain Score 6    Pain Location Neck   Pain Descriptors / Indicators Aching;Dull   Multiple Pain Sites Yes   Pain Score 6   Pain Location Back   Pain Orientation Lower   Pain Descriptors / Indicators Dull;Aching;Constant            [facility] PT Assessment - 05/04/15 0001    Assessment   Next MD Visit October   Prior Therapy Yes- OPPT for LBP- no relief   Balance Screen   Has the patient fallen in the past 6 months  No   Has the patient had a decrease in activity level because of a fear of falling?  No   Is the patient reluctant to leave their home because of a fear of falling?  No   Home Environment   Living Environment Private residence   Living Arrangements Spouse/significant other   Type of [place] Access Level entry   Prior Function   Level of [place] Retired   Leisure walking, playing with grandchild, yardwork   Observation/Other Assessments   Focus on Therapeutic Outcomes (FOTO)  18% limited   ROM / Strength   AROM / PROM / Strength AROM;PROM;Strength   AROM   AROM Assessment Site Cervical;Lumbar   Cervical Flexion 50   Cervical Extension 43   Cervical - Right Side Bend 42   Cervical - Left Side Bend 38   Cervical - Right Rotation 85   Cervical - Left Rotation 70   Lumbar Flexion 56    Lumbar Extension 22   Lumbar - Right Side Bend 14   Lumbar - Left Side Bend 21   Strength   Strength Assessment Site Hip;Knee   Right/Left Hip Right;Left   Right Hip Flexion 3+/5   Right Hip Extension 4-/5   Right Hip ABduction 4-/5   Left Hip Flexion 4-/5   Left Hip Extension 4-/5   Left Hip ABduction 4-/5   Right/Left Knee Right;Left   Right Knee Flexion 4+/5   Right Knee Extension 4+/5   Left Knee Flexion 4+/5   Left Knee Extension 4+/5   Palpation   SI assessment  L innominate rotated anteriorly   Palpation comment significant tightness noted in bilateral upper traps, cervical paraspinals, suboccipital mm   Special Tests    Special Tests Lumbar   Lumbar Tests FABER test   FABER test   findings Positive   Side LEft   Transfers   Five time sit to stand comments  16.63"                   [facility] Adult PT Treatment/Exercise - 05/04/15 0001    Manual Therapy   Manual Therapy Muscle Energy Technique   Muscle Energy Technique MET on L to correct anterior rotation- isometric hip extension in supine                PT Education - 05/04/15 1613    Education provided Yes   Education Details Prognosis, HEP   Person(s) Educated Patient   Methods Explanation;Handout   Comprehension Verbalized understanding          PT Short Term Goals - 05/04/15 1646    PT SHORT TERM GOAL #1   Title Pt will be independent with HEP   Time 3   Period Weeks   Status New   PT SHORT TERM GOAL #2   Title Improve lumbar flexion to 70 degrees or greater to allow pt to bend forward to pick up objects off of the ground.    Time 3   Period Weeks   Status New   PT SHORT TERM GOAL #3   Title Improve bilateral hip strength to 4/5 grossly to improve functional mobility and gait mechanics.    Time 3   Period Weeks   Status New   PT SHORT TERM GOAL #4   Title Pt will demonstrate decreased tightness in bilateral cervical musculature to allow her to turn her head fully to L side  to see while driving.  Time 3   Period Weeks   Status New           PT Long Term Goals - May 17, 2015 1648    PT LONG TERM GOAL #1   Title Pt will be independent with advanced HEP that is updated PRN.    Time 6   Period Weeks   Status New   PT LONG TERM GOAL #2   Title Improve lumbar flexion to 90 degrees without pain to allow for pt to bend forward to pick up objects off of the floor.    Time 6   Period Weeks   Status New   PT LONG TERM GOAL #3   Title Improve bilateral hip strength to 4+/5 or greater to improve gait mechanics and functional mobility.   Time 6   Period Weeks   Status New   PT LONG TERM GOAL #4   Title Pt will demonstrate proper alignment of SI joint without manual therapy to decrease back pain and to allow for pt to resume walking to improve health.    Time 6   Period Weeks   Status New   PT LONG TERM GOAL #5   Title Pt will demonstrate improved postural muscle strength evidenced by decreased muscle spasm in cervical spine and 0/10 neck pain to allow for pt to achieve full ROM in cervical spine and for decreasaed neck pain.    Time 6   Period Weeks   Additional Long Term Goals   Additional Long Term Goals Yes   PT LONG TERM GOAL #6   Title Pt will complete five time sit to stand in 12 seconds or less to demonstrate improved BLE strength and power.    Time 6   Period Weeks   Status New               Plan - 05-17-2015 1656    Clinical Impression Statement Pt presents to PT with c/o LBP and neck pain that have worsened over the past 2 months. She demonstrates impairments in lumbar and cervical ROM, BLE strength, functional mobility, gait mechanics, and has increased pain levels. Pt will benefit from skilled PT services at this time to improve core strength, BLE strength, posture, muscular tightness in cervical region, functional activity tolerance, and functional mobility in order to return pt to PLOF.    Pt will benefit from skilled therapeutic  intervention in order to improve on the following deficits Abnormal gait;Decreased activity tolerance;Decreased range of motion;Decreased strength;Difficulty walking;Increased muscle spasms;Postural dysfunction;Pain   Rehab Potential Good   PT Frequency 2x / week   PT Duration 6 weeks   PT Treatment/Interventions ADLs/Self Care Home Management;Moist Heat;Gait training;Functional mobility training;Stair training;Therapeutic activities;Therapeutic exercise;Balance training;Neuromuscular re-education;Patient/family education;Manual techniques   PT Next Visit Plan Check SI and correct if necessary, continue with core and BLE strengthening, begin postural strengthening, manual PRN to cervical spine          G-Codes - 05/17/2015 1645    Functional Assessment Tool Used FOTO   Functional Limitation Mobility: Walking and moving around   Mobility: Walking and Moving Around Current Status [phone]) At least 20 percent but less than 40 percent impaired, limited or restricted   Mobility: Walking and Moving Around Goal Status [phone]) At least 1 percent but less than 20 percent impaired, limited or restricted       Problem List Patient Active Problem List   Diagnosis Date Noted  . IFG (impaired fasting glucose) 05/03/2015  . Psoriasis May 16, 2014  .  Greater trochanteric bursitis of right hip 03/14/2014  . Obesity (BMI 30-39.9) 05/26/2013  . GERD (gastroesophageal reflux disease) 12/31/2010  . Hypothyroidism 12/31/2010  . Vitamin D deficiency 12/31/2010  . Pure hypercholesterolemia 12/31/2010    Hilma Favors, PT, DPT [phone] 05/04/2015, 5:00 PM  [facility] [place] [address] [place], [place], [address] Phone: [phone]   Fax:  [phone]

## 2015-05-04 NOTE — Patient Instructions (Signed)
Knee-to-Chest Stretch: Unilateral   With hand behind right knee, pull knee in to chest until a comfortable stretch is felt in lower back and buttocks. Keep back relaxed. Hold __10__ seconds. Repeat _5___ times per set. Do __1__ sets per session. Do __2__ sessions per day.  http://orth.exer.us/126   Copyright  VHI. All rights reserved.  Bridging   Slowly raise buttocks from floor, keeping stomach tight. Repeat __10__ times per set. Do __1__ sets per session. Do __2__ sessions per day.  http://orth.exer.us/1097   Copyright  VHI. All rights reserved.  Isometric Abdominal   Lying on back with knees bent, tighten stomach by pressing elbows down. Hold __3__ seconds. Repeat __10__ times per set. Do __1__ sets per session. Do __2__ sessions per day.  http://orth.exer.us/1086   Copyright  VHI. All rights reserved.

## 2015-05-09 ENCOUNTER — Ambulatory Visit (HOSPITAL_COMMUNITY): Payer: Medicare Other | Admitting: Physical Therapy

## 2015-05-09 DIAGNOSIS — M545 Low back pain, unspecified: Secondary | ICD-10-CM

## 2015-05-09 DIAGNOSIS — R6889 Other general symptoms and signs: Secondary | ICD-10-CM

## 2015-05-09 DIAGNOSIS — M542 Cervicalgia: Secondary | ICD-10-CM | POA: Diagnosis not present

## 2015-05-09 DIAGNOSIS — M533 Sacrococcygeal disorders, not elsewhere classified: Secondary | ICD-10-CM

## 2015-05-09 DIAGNOSIS — R262 Difficulty in walking, not elsewhere classified: Secondary | ICD-10-CM

## 2015-05-09 NOTE — Patient Instructions (Signed)
Piriformis Stretch, Sitting   Sit, one ankle on opposite knee, same-side hand on crossed knee. Push down on knee, keeping spine straight. Lean torso forward, with flat back, until tension is felt in hamstrings and gluteals of crossed-leg side. Hold _30__ seconds.  Repeat _3__ times per session. Do _2__ sessions per day.  Copyright  VHI. All rights reserved.  Bent Leg Lift (Hook-Lying)   Tighten stomach and slowly raise right leg _3-5___ inches from floor. Keep trunk rigid.  Repeat __10__ times per set. Do __1__ sets per session. Do __2__ sessions per day.  http://orth.exer.us/1090   Copyright  VHI. All rights reserved.

## 2015-05-09 NOTE — Therapy (Signed)
[place], [place], [address] Phone: [phone]   Fax:  [phone]  Physical Therapy Treatment  Patient Details  Name: Mckenzie Webb MRN: [id number] Date of Birth: [birth year] Referring Provider:  Rita Ohara, MD  Encounter Date: 05/09/2015      PT End of Session - 05/09/15 1608    Visit Number 2   Number of Visits 12   Date for PT Re-Evaluation 06/01/15   Authorization Type Medicare   Authorization Time Period 05/04/15-07/16/15   Authorization - Visit Number 2   Authorization - Number of Visits 10   PT Start Time 7322   PT Stop Time 1559   PT Time Calculation (min) 44 min   Activity Tolerance Patient tolerated treatment well   Behavior During Therapy [facility] for tasks assessed/performed      Past Medical History  Diagnosis Date  . Psoriasis   . GERD (gastroesophageal reflux disease)   . Allergy   . Interstitial cystitis   . Leukopenia   . Edema   . Hyperlipidemia   . Diabetes mellitus 4/07    pt reports that all subsequent tests were normal, not diabetic  . Obesity   . DJD (degenerative joint disease), cervical 04 2002  . Hypothyroid   . Prediabetes 06/2014    fasting glucose 109; A1c 5.7    Past Surgical History  Procedure Laterality Date  . Cholecystectomy    . Tubal ligation  1983  . Knee arthroscopy Right 1990  . Colonoscopy  3/09  . Varicose vein surgery  09/2009 R, 06/2010 L  . Knee arthroscopy Left 12/12/2010     (Dr. Noemi Chapel)    There were no vitals filed for this visit.  Visit Diagnosis:  Bilateral low back pain without sciatica  Difficulty walking  Sacroiliac joint dysfunction of left side  Decreased functional activity tolerance      Subjective Assessment - 05/09/15 1522    Subjective Pt reports that her low back back is really bothering her today, rates pain as a 6-7/10. She reports that the pain woke her up in the middle of the night last night. She has been doing her  HEP daily, which has not increased her pain, but she is having a bad day today.    Currently in Pain? Yes   Pain Score 7    Pain Location Back                         [facility] Adult PT Treatment/Exercise - 05/09/15 0001    Exercises   Exercises Lumbar;Knee/Hip   Lumbar Exercises: Stretches   Active Hamstring Stretch 3 reps;30 seconds   Active Hamstring Stretch Limitations 12" step   Single Knee to Chest Stretch 5 reps;10 seconds   Lower Trunk Rotation Limitations 20 reps, 3 sec hold   Piriformis Stretch 2 reps;30 seconds   Piriformis Stretch Limitations seated   Lumbar Exercises: Supine   Ab Set 10 reps;3 seconds   Bent Knee Raise 10 reps   Bent Knee Raise Limitations with ab set   Bridge 10 reps   Manual Therapy   Manual Therapy Soft tissue mobilization;Myofascial release   Manual therapy comments 25 minutes   Soft tissue mobilization STM to bilateral lumbar paraspinals, QL, sacral sulcus   Myofascial Release myofascial release to bilateral piriformis muscle                PT Education - 05/09/15 1608  Education provided Yes   Education Details Added piriformis stretch and bent knee raise to HEP   Person(s) Educated Patient   Methods Explanation;Handout   Comprehension Verbalized understanding          PT Short Term Goals - 05/04/15 1646    PT SHORT TERM GOAL #1   Title Pt will be independent with HEP   Time 3   Period Weeks   Status New   PT SHORT TERM GOAL #2   Title Improve lumbar flexion to 70 degrees or greater to allow pt to bend forward to pick up objects off of the ground.    Time 3   Period Weeks   Status New   PT SHORT TERM GOAL #3   Title Improve bilateral hip strength to 4/5 grossly to improve functional mobility and gait mechanics.    Time 3   Period Weeks   Status New   PT SHORT TERM GOAL #4   Title Pt will demonstrate decreased tightness in bilateral cervical musculature to allow her to turn her head fully to L side to  see while driving.    Time 3   Period Weeks   Status New           PT Long Term Goals - 05/04/15 1648    PT LONG TERM GOAL #1   Title Pt will be independent with advanced HEP that is updated PRN.    Time 6   Period Weeks   Status New   PT LONG TERM GOAL #2   Title Improve lumbar flexion to 90 degrees without pain to allow for pt to bend forward to pick up objects off of the floor.    Time 6   Period Weeks   Status New   PT LONG TERM GOAL #3   Title Improve bilateral hip strength to 4+/5 or greater to improve gait mechanics and functional mobility.   Time 6   Period Weeks   Status New   PT LONG TERM GOAL #4   Title Pt will demonstrate proper alignment of SI joint without manual therapy to decrease back pain and to allow for pt to resume walking to improve health.    Time 6   Period Weeks   Status New   PT LONG TERM GOAL #5   Title Pt will demonstrate improved postural muscle strength evidenced by decreased muscle spasm in cervical spine and 0/10 neck pain to allow for pt to achieve full ROM in cervical spine and for decreasaed neck pain.    Time 6   Period Weeks   Additional Long Term Goals   Additional Long Term Goals Yes   PT LONG TERM GOAL #6   Title Pt will complete five time sit to stand in 12 seconds or less to demonstrate improved BLE strength and power.    Time 6   Period Weeks   Status New               Plan - 05/09/15 1609    Clinical Impression Statement Pt presented to PT with increased pain levels today. Treatment began with manual therapy to lumbar spine, piriformis, and SI joint to decrease pain, followed by gentle active stretching and supine core strengthening. Bent knee raise with abdominal brace was added to POC, which pt was able to complete without pain. Pt reported 1/10 pain post treatment, which significantly decreased from 7/10 at beginning of treatment.    PT Next Visit Plan Check SI and correct  if necessary. Begin with manual therapy if  pt continues to report increased pain levels, continue with core/hip strengthening and begin postural strengthening        Problem List Patient Active Problem List   Diagnosis Date Noted  . IFG (impaired fasting glucose) 05/03/2015  . Psoriasis 05/03/2014  . Greater trochanteric bursitis of right hip 03/14/2014  . Obesity (BMI 30-39.9) 05/26/2013  . GERD (gastroesophageal reflux disease) 12/31/2010  . Hypothyroidism 12/31/2010  . Vitamin D deficiency 12/31/2010  . Pure hypercholesterolemia 12/31/2010    Hilma Favors, PT, DPT [phone] 05/09/2015, 4:12 PM  [place] [address] [place], [place], [address] Phone: [phone]   Fax:  [phone]

## 2015-05-11 ENCOUNTER — Ambulatory Visit (HOSPITAL_COMMUNITY): Payer: Medicare Other | Admitting: Physical Therapy

## 2015-05-11 DIAGNOSIS — R6889 Other general symptoms and signs: Secondary | ICD-10-CM

## 2015-05-11 DIAGNOSIS — M533 Sacrococcygeal disorders, not elsewhere classified: Secondary | ICD-10-CM | POA: Diagnosis not present

## 2015-05-11 DIAGNOSIS — M545 Low back pain, unspecified: Secondary | ICD-10-CM

## 2015-05-11 DIAGNOSIS — R262 Difficulty in walking, not elsewhere classified: Secondary | ICD-10-CM | POA: Diagnosis not present

## 2015-05-11 DIAGNOSIS — M542 Cervicalgia: Secondary | ICD-10-CM | POA: Diagnosis not present

## 2015-05-11 NOTE — Therapy (Signed)
[place], [place], [address] Phone: [phone]   Fax:  [phone]  Physical Therapy Treatment  Patient Details  Name: Mckenzie Webb MRN: [id number] Date of Birth: 1946-06-12 Referring Provider:  Rita Ohara, MD  Encounter Date: 05/11/2015      PT End of Session - 05/11/15 1609    Visit Number 3   Number of Visits 12   Date for PT Re-Evaluation 06/01/15   Authorization Type Medicare   Authorization Time Period 05/04/15-07/16/15   Authorization - Visit Number 3   Authorization - Number of Visits 10   PT Start Time 1520   PT Stop Time 1602   PT Time Calculation (min) 42 min   Activity Tolerance Patient tolerated treatment well   Behavior During Therapy [facility] for tasks assessed/performed      Past Medical History  Diagnosis Date  . Psoriasis   . GERD (gastroesophageal reflux disease)   . Allergy   . Interstitial cystitis   . Leukopenia   . Edema   . Hyperlipidemia   . Diabetes mellitus 4/07    pt reports that all subsequent tests were normal, not diabetic  . Obesity   . DJD (degenerative joint disease), cervical 04 2002  . Hypothyroid   . Prediabetes 06/2014    fasting glucose 109; A1c 5.7    Past Surgical History  Procedure Laterality Date  . Cholecystectomy    . Tubal ligation  1983  . Knee arthroscopy Right 1990  . Colonoscopy  3/09  . Varicose vein surgery  09/2009 R, 06/2010 L  . Knee arthroscopy Left 12/12/2010     (Dr. Noemi Chapel)    There were no vitals filed for this visit.  Visit Diagnosis:  Bilateral low back pain without sciatica  Difficulty walking  Sacroiliac joint dysfunction of left side  Decreased functional activity tolerance  Cervicalgia      Subjective Assessment - 05/11/15 1617    Subjective Pt states she was very sore after last session.  States her piriformis pain is gone but still hurting up in her Rt SI region.  Currently with 5/10 pain.   Currently in Pain?  Yes   Pain Score 5    Pain Location Back   Pain Orientation Right                         [facility] Adult PT Treatment/Exercise - 05/11/15 1523    Lumbar Exercises: Stretches   Active Hamstring Stretch 30 seconds;2 reps   Active Hamstring Stretch Limitations 12" step   Single Knee to Chest Stretch 5 reps;10 seconds   Lower Trunk Rotation Limitations 20 reps, 3 sec hold   Lumbar Exercises: Supine   Ab Set 10 reps;3 seconds   Bridge 10 reps   Lumbar Exercises: Prone   Straight Leg Raise 10 reps   Other Prone Lumbar Exercises POE 2 minutes   Other Prone Lumbar Exercises press up 5 reps   Manual Therapy   Manual Therapy Soft tissue mobilization;Muscle Energy Technique   Soft tissue mobilization STM to bilateral lumbar (Rt>Lt) paraspinals, QL, sacral sulcus   Muscle Energy Technique MET to correct Lt anterior rotation                  PT Short Term Goals - 05/04/15 1646    PT SHORT TERM GOAL #1   Title Pt will be independent with HEP   Time 3   Period  Weeks   Status New   PT SHORT TERM GOAL #2   Title Improve lumbar flexion to 70 degrees or greater to allow pt to bend forward to pick up objects off of the ground.    Time 3   Period Weeks   Status New   PT SHORT TERM GOAL #3   Title Improve bilateral hip strength to 4/5 grossly to improve functional mobility and gait mechanics.    Time 3   Period Weeks   Status New   PT SHORT TERM GOAL #4   Title Pt will demonstrate decreased tightness in bilateral cervical musculature to allow her to turn her head fully to L side to see while driving.    Time 3   Period Weeks   Status New           PT Long Term Goals - 05/04/15 1648    PT LONG TERM GOAL #1   Title Pt will be independent with advanced HEP that is updated PRN.    Time 6   Period Weeks   Status New   PT LONG TERM GOAL #2   Title Improve lumbar flexion to 90 degrees without pain to allow for pt to bend forward to pick up objects off of the  floor.    Time 6   Period Weeks   Status New   PT LONG TERM GOAL #3   Title Improve bilateral hip strength to 4+/5 or greater to improve gait mechanics and functional mobility.   Time 6   Period Weeks   Status New   PT LONG TERM GOAL #4   Title Pt will demonstrate proper alignment of SI joint without manual therapy to decrease back pain and to allow for pt to resume walking to improve health.    Time 6   Period Weeks   Status New   PT LONG TERM GOAL #5   Title Pt will demonstrate improved postural muscle strength evidenced by decreased muscle spasm in cervical spine and 0/10 neck pain to allow for pt to achieve full ROM in cervical spine and for decreasaed neck pain.    Time 6   Period Weeks   Additional Long Term Goals   Additional Long Term Goals Yes   PT LONG TERM GOAL #6   Title Pt will complete five time sit to stand in 12 seconds or less to demonstrate improved BLE strength and power.    Time 6   Period Weeks   Status New               Plan - 05/11/15 1615    Clinical Impression Statement Began session with stretches today.  Added extension therex in prone with patient reporting reduction of pain.  SI anteriorly rotated and able to correct with MET in supine.   Soft tissue massage also complete to bilateral lumbar and sacral musculature with reduction of pain to 2/10 at end of session.    PT Next Visit Plan Check SI and correct if necessary. Begin with manual therapy if pt continues to report increased pain levels, continue with core/hip strengthening and begin postural strengthening        Problem List Patient Active Problem List   Diagnosis Date Noted  . IFG (impaired fasting glucose) 05/03/2015  . Psoriasis 05/03/2014  . Greater trochanteric bursitis of right hip 03/14/2014  . Obesity (BMI 30-39.9) 05/26/2013  . GERD (gastroesophageal reflux disease) 12/31/2010  . Hypothyroidism 12/31/2010  . Vitamin D deficiency 12/31/2010  .  Pure hypercholesterolemia  12/31/2010    Teena Irani, PTA/CLT [phone]  05/11/2015, 4:19 PM  [place] [address] [place], [place], [address] Phone: [phone]   Fax:  [phone]

## 2015-05-16 ENCOUNTER — Ambulatory Visit (HOSPITAL_COMMUNITY): Payer: Medicare Other | Admitting: Physical Therapy

## 2015-05-16 DIAGNOSIS — R6889 Other general symptoms and signs: Secondary | ICD-10-CM

## 2015-05-16 DIAGNOSIS — M545 Low back pain, unspecified: Secondary | ICD-10-CM

## 2015-05-16 DIAGNOSIS — M533 Sacrococcygeal disorders, not elsewhere classified: Secondary | ICD-10-CM | POA: Diagnosis not present

## 2015-05-16 DIAGNOSIS — M542 Cervicalgia: Secondary | ICD-10-CM | POA: Diagnosis not present

## 2015-05-16 DIAGNOSIS — R262 Difficulty in walking, not elsewhere classified: Secondary | ICD-10-CM | POA: Diagnosis not present

## 2015-05-16 NOTE — Therapy (Signed)
[place], [place], [address] Phone: [phone]   Fax:  [phone]  Physical Therapy Treatment  Patient Details  Name: Mckenzie Webb MRN: [id number] Date of Birth: [birth year] Referring Provider:  Rita Ohara, MD  Encounter Date: 05/16/2015      PT End of Session - 05/16/15 1609    Visit Number 4   Number of Visits 12   Authorization Time Period 05/04/15-07/16/15   Authorization - Visit Number 4   Authorization - Number of Visits 10   PT Start Time 1660   PT Stop Time 1558   PT Time Calculation (min) 42 min   Activity Tolerance Patient tolerated treatment well      Past Medical History  Diagnosis Date  . Psoriasis   . GERD (gastroesophageal reflux disease)   . Allergy   . Interstitial cystitis   . Leukopenia   . Edema   . Hyperlipidemia   . Diabetes mellitus 4/07    pt reports that all subsequent tests were normal, not diabetic  . Obesity   . DJD (degenerative joint disease), cervical 04 2002  . Hypothyroid   . Prediabetes 06/2014    fasting glucose 109; A1c 5.7    Past Surgical History  Procedure Laterality Date  . Cholecystectomy    . Tubal ligation  1983  . Knee arthroscopy Right 1990  . Colonoscopy  3/09  . Varicose vein surgery  09/2009 R, 06/2010 L  . Knee arthroscopy Left 12/12/2010     (Dr. Noemi Chapel)    There were no vitals filed for this visit.  Visit Diagnosis:  Bilateral low back pain without sciatica  Difficulty walking  Sacroiliac joint dysfunction of left side  Decreased functional activity tolerance      Subjective Assessment - 05/16/15 1602    Subjective Pt states she is having pain on the left side of her back but denies any radicular pain.   Currently in Pain? Yes   Pain Score 4    Pain Location Back   Pain Orientation Left   Pain Descriptors / Indicators Aching                 [facility] Adult PT Treatment/Exercise - 05/16/15 0001    Lumbar Exercises:  Stretches   Active Hamstring Stretch 3 reps;30 seconds   Active Hamstring Stretch Limitations supine   Single Knee to Chest Stretch 2 reps;30 seconds   Lumbar Exercises: Standing   Heel Raises 10 reps   Heel Raises Limitations with squats    Functional Squats 10 reps   Forward Lunge 10 reps   Other Standing Lumbar Exercises 3 D hip excursion x 3    Lumbar Exercises: Supine   Bent Knee Raise 5 reps   Bridge 15 reps   Straight Leg Raise 10 reps   Other Supine Lumbar Exercises dead bug x 10   Lumbar Exercises: Prone   Opposite Arm/Leg Raise Right arm/Left leg;Left arm/Right leg;10 reps   Lumbar Exercises: Quadruped   Madcat/Old Horse 5 reps   Manual Therapy   Manual Therapy Muscle Energy Technique   Manual therapy comments To correct Lt SI postreior rotation.(mobilized into anterior )                  PT Short Term Goals - 05/04/15 1646    PT SHORT TERM GOAL #1   Title Pt will be independent with HEP   Time 3   Period Weeks   Status  New   PT SHORT TERM GOAL #2   Title Improve lumbar flexion to 70 degrees or greater to allow pt to bend forward to pick up objects off of the ground.    Time 3   Period Weeks   Status New   PT SHORT TERM GOAL #3   Title Improve bilateral hip strength to 4/5 grossly to improve functional mobility and gait mechanics.    Time 3   Period Weeks   Status New   PT SHORT TERM GOAL #4   Title Pt will demonstrate decreased tightness in bilateral cervical musculature to allow her to turn her head fully to L side to see while driving.    Time 3   Period Weeks   Status New           PT Long Term Goals - 05/04/15 1648    PT LONG TERM GOAL #1   Title Pt will be independent with advanced HEP that is updated PRN.    Time 6   Period Weeks   Status New   PT LONG TERM GOAL #2   Title Improve lumbar flexion to 90 degrees without pain to allow for pt to bend forward to pick up objects off of the floor.    Time 6   Period Weeks   Status New    PT LONG TERM GOAL #3   Title Improve bilateral hip strength to 4+/5 or greater to improve gait mechanics and functional mobility.   Time 6   Period Weeks   Status New   PT LONG TERM GOAL #4   Title Pt will demonstrate proper alignment of SI joint without manual therapy to decrease back pain and to allow for pt to resume walking to improve health.    Time 6   Period Weeks   Status New   PT LONG TERM GOAL #5   Title Pt will demonstrate improved postural muscle strength evidenced by decreased muscle spasm in cervical spine and 0/10 neck pain to allow for pt to achieve full ROM in cervical spine and for decreasaed neck pain.    Time 6   Period Weeks   Additional Long Term Goals   Additional Long Term Goals Yes   PT LONG TERM GOAL #6   Title Pt will complete five time sit to stand in 12 seconds or less to demonstrate improved BLE strength and power.    Time 6   Period Weeks   Status New               Plan - 05/16/15 1609    Clinical Impression Statement Pt SI checked both at the beginning of session, ( pt needed SI adjustment) as well as the end of the session,(SI had manitained proper alignment).  Added quadriped and standing stabilization activities.    PT Treatment/Interventions ADLs/Self Care Home Management;Moist Heat;Gait training;Functional mobility training;Stair training;Therapeutic activities;Therapeutic exercise;Balance training;Neuromuscular re-education;Patient/family education;Manual techniques   PT Next Visit Plan Check SI; attempt all 4 hip extension as well as opposite arm/leg raise next visit.        Problem List Patient Active Problem List   Diagnosis Date Noted  . IFG (impaired fasting glucose) 05/03/2015  . Psoriasis 05/03/2014  . Greater trochanteric bursitis of right hip 03/14/2014  . Obesity (BMI 30-39.9) 05/26/2013  . GERD (gastroesophageal reflux disease) 12/31/2010  . Hypothyroidism 12/31/2010  . Vitamin D deficiency 12/31/2010  . Pure  hypercholesterolemia 12/31/2010    Rayetta Humphrey, PT CLT [phone] 05/16/2015, 4:12  PM  [place], [place], [address] Phone: [phone]   Fax:  [phone]

## 2015-05-18 ENCOUNTER — Ambulatory Visit (HOSPITAL_COMMUNITY): Payer: Medicare Other | Admitting: Physical Therapy

## 2015-05-18 DIAGNOSIS — M533 Sacrococcygeal disorders, not elsewhere classified: Secondary | ICD-10-CM

## 2015-05-18 DIAGNOSIS — M545 Low back pain, unspecified: Secondary | ICD-10-CM

## 2015-05-18 DIAGNOSIS — R262 Difficulty in walking, not elsewhere classified: Secondary | ICD-10-CM

## 2015-05-18 DIAGNOSIS — R6889 Other general symptoms and signs: Secondary | ICD-10-CM | POA: Diagnosis not present

## 2015-05-18 DIAGNOSIS — M542 Cervicalgia: Secondary | ICD-10-CM | POA: Diagnosis not present

## 2015-05-18 NOTE — Therapy (Signed)
[place], [place], [address] Phone: [phone]   Fax:  [phone]  Physical Therapy Treatment  Patient Details  Name: Mckenzie Webb MRN: [id number] Date of Birth: 1945/12/24 Referring Provider:  Rita Ohara, MD  Encounter Date: 05/18/2015      PT End of Session - 05/18/15 1351    Visit Number 5   Number of Visits 12   Date for PT Re-Evaluation 06/01/15   Authorization Type Medicare   Authorization Time Period 05/04/15-07/16/15   Authorization - Visit Number 5   Authorization - Number of Visits 20   PT Start Time 6962   PT Stop Time 1447   PT Time Calculation (min) 104 min   Activity Tolerance Patient tolerated treatment well   Behavior During Therapy [facility] for tasks assessed/performed      Past Medical History  Diagnosis Date  . Psoriasis   . GERD (gastroesophageal reflux disease)   . Allergy   . Interstitial cystitis   . Leukopenia   . Edema   . Hyperlipidemia   . Diabetes mellitus 4/07    pt reports that all subsequent tests were normal, not diabetic  . Obesity   . DJD (degenerative joint disease), cervical 04 2002  . Hypothyroid   . Prediabetes 06/2014    fasting glucose 109; A1c 5.7    Past Surgical History  Procedure Laterality Date  . Cholecystectomy    . Tubal ligation  1983  . Knee arthroscopy Right 1990  . Colonoscopy  3/09  . Varicose vein surgery  09/2009 R, 06/2010 L  . Knee arthroscopy Left 12/12/2010     (Dr. Noemi Chapel)    There were no vitals filed for this visit.  Visit Diagnosis:  Bilateral low back pain without sciatica  Difficulty walking  Sacroiliac joint dysfunction of left side  Decreased functional activity tolerance      Subjective Assessment - 05/18/15 1303    Subjective Pt states that her back is feeling better    Currently in Pain? Yes   Pain Score 3              [facility] Adult PT Treatment/Exercise - 05/18/15 0001    Lumbar Exercises: Stretches    Active Hamstring Stretch 3 reps;30 seconds   Active Hamstring Stretch Limitations supine   Single Knee to Chest Stretch 2 reps;30 seconds   Lower Trunk Rotation Limitations 10   Prone on Elbows Stretch 5 reps   Press Ups 5 reps   Lumbar Exercises: Standing   Heel Raises 10 reps   Heel Raises Limitations with squats    Functional Squats 10 reps   Other Standing Lumbar Exercises 3 D hip excursion x 3    Lumbar Exercises: Supine   Bent Knee Raise 10 reps   Bent Knee Raise Limitations dead bug    Bridge 15 reps   Straight Leg Raise 10 reps   Straight Leg Raises Limitations floating    Lumbar Exercises: Quadruped   Madcat/Old Horse 5 reps   Straight Leg Raise 10 reps   Opposite Arm/Leg Raise Right arm/Left leg;Left arm/Right leg;5 reps   Manual Therapy   Manual Therapy Soft tissue mobilization;Muscle Energy Technique   Manual therapy comments concentraiting on Lt lumbar area    Muscle Energy Technique SI checked did not need adjustment                 PT Education - 05/18/15 1351    Education provided Yes  Education Details Encouraged to begin walking 15 minutes and slowly work back up to her norm or 2 miles per day.   Methods Explanation   Comprehension Verbalized understanding          PT Short Term Goals - 05/04/15 1646    PT SHORT TERM GOAL #1   Title Pt will be independent with HEP   Time 3   Period Weeks   Status New   PT SHORT TERM GOAL #2   Title Improve lumbar flexion to 70 degrees or greater to allow pt to bend forward to pick up objects off of the ground.    Time 3   Period Weeks   Status New   PT SHORT TERM GOAL #3   Title Improve bilateral hip strength to 4/5 grossly to improve functional mobility and gait mechanics.    Time 3   Period Weeks   Status New   PT SHORT TERM GOAL #4   Title Pt will demonstrate decreased tightness in bilateral cervical musculature to allow her to turn her head fully to L side to see while driving.    Time 3    Period Weeks   Status New           PT Long Term Goals - 05/04/15 1648    PT LONG TERM GOAL #1   Title Pt will be independent with advanced HEP that is updated PRN.    Time 6   Period Weeks   Status New   PT LONG TERM GOAL #2   Title Improve lumbar flexion to 90 degrees without pain to allow for pt to bend forward to pick up objects off of the floor.    Time 6   Period Weeks   Status New   PT LONG TERM GOAL #3   Title Improve bilateral hip strength to 4+/5 or greater to improve gait mechanics and functional mobility.   Time 6   Period Weeks   Status New   PT LONG TERM GOAL #4   Title Pt will demonstrate proper alignment of SI joint without manual therapy to decrease back pain and to allow for pt to resume walking to improve health.    Time 6   Period Weeks   Status New   PT LONG TERM GOAL #5   Title Pt will demonstrate improved postural muscle strength evidenced by decreased muscle spasm in cervical spine and 0/10 neck pain to allow for pt to achieve full ROM in cervical spine and for decreasaed neck pain.    Time 6   Period Weeks   Additional Long Term Goals   Additional Long Term Goals Yes   PT LONG TERM GOAL #6   Title Pt will complete five time sit to stand in 12 seconds or less to demonstrate improved BLE strength and power.    Time 6   Period Weeks   Status New               Plan - 05/18/15 1352    Clinical Impression Statement Pt SI did not need adjusting; paravertebral mm tight therefore completed soft tissue mobilization to loosen.  Added quadriped SLR as well as opposite arm/leg    PT Next Visit Plan begin lunging activity next time begin yoga stances as able         Problem List Patient Active Problem List   Diagnosis Date Noted  . IFG (impaired fasting glucose) 05/03/2015  . Psoriasis 05/03/2014  . Greater trochanteric bursitis  of right hip 03/14/2014  . Obesity (BMI 30-39.9) 05/26/2013  . GERD (gastroesophageal reflux disease) 12/31/2010   . Hypothyroidism 12/31/2010  . Vitamin D deficiency 12/31/2010  . Pure hypercholesterolemia 12/31/2010  Rayetta Humphrey, PT CLT [phone]  05/18/2015, 1:54 PM  [place] [address] [place], [place], [address] Phone: [phone]   Fax:  [phone]

## 2015-05-23 ENCOUNTER — Ambulatory Visit (HOSPITAL_COMMUNITY): Payer: Medicare Other | Attending: Family Medicine | Admitting: Physical Therapy

## 2015-05-23 DIAGNOSIS — R262 Difficulty in walking, not elsewhere classified: Secondary | ICD-10-CM

## 2015-05-23 DIAGNOSIS — M545 Low back pain, unspecified: Secondary | ICD-10-CM

## 2015-05-23 DIAGNOSIS — M542 Cervicalgia: Secondary | ICD-10-CM

## 2015-05-23 DIAGNOSIS — R6889 Other general symptoms and signs: Secondary | ICD-10-CM | POA: Diagnosis not present

## 2015-05-23 DIAGNOSIS — M533 Sacrococcygeal disorders, not elsewhere classified: Secondary | ICD-10-CM | POA: Diagnosis not present

## 2015-05-23 NOTE — Therapy (Signed)
[place], [place], [address] Phone: [phone]   Fax:  [phone]  Physical Therapy Treatment  Patient Details  Name: Mckenzie Webb MRN: [id number] Date of Birth: 06-Jul-1946 Referring Provider:  Rita Ohara, MD  Encounter Date: 05/23/2015      PT End of Session - 05/23/15 1605    Visit Number 6   Number of Visits 12   Date for PT Re-Evaluation 06/01/15   Authorization Type Medicare   Authorization Time Period 05/04/15-07/16/15   Authorization - Visit Number 6   Authorization - Number of Visits 20   PT Start Time 1520   PT Stop Time 1600   PT Time Calculation (min) 40 min   Activity Tolerance Patient tolerated treatment well   Behavior During Therapy [facility] for tasks assessed/performed      Past Medical History  Diagnosis Date  . Psoriasis   . GERD (gastroesophageal reflux disease)   . Allergy   . Interstitial cystitis   . Leukopenia   . Edema   . Hyperlipidemia   . Diabetes mellitus 4/07    pt reports that all subsequent tests were normal, not diabetic  . Obesity   . DJD (degenerative joint disease), cervical 04 2002  . Hypothyroid   . Prediabetes 06/2014    fasting glucose 109; A1c 5.7    Past Surgical History  Procedure Laterality Date  . Cholecystectomy    . Tubal ligation  1983  . Knee arthroscopy Right 1990  . Colonoscopy  3/09  . Varicose vein surgery  09/2009 R, 06/2010 L  . Knee arthroscopy Left 12/12/2010     (Dr. Noemi Chapel)    There were no vitals filed for this visit.  Visit Diagnosis:  Bilateral low back pain without sciatica  Difficulty walking  Sacroiliac joint dysfunction of left side  Decreased functional activity tolerance  Cervicalgia      Subjective Assessment - 05/23/15 1526    Subjective Pt states her pain is 3/10 today and feeling better overall.   Currently in Pain? Yes   Pain Score 3    Pain Location Back   Pain Orientation Mid;Lower   Pain  Descriptors / Indicators Aching;Dull                         [facility] Adult PT Treatment/Exercise - 05/23/15 1520    Lumbar Exercises: Stretches   Active Hamstring Stretch 3 reps;30 seconds   Active Hamstring Stretch Limitations 12" step   Single Knee to Chest Stretch 2 reps;30 seconds   Press Ups 5 reps   Lumbar Exercises: Standing   Heel Raises 15 reps   Heel Raises Limitations with squats    Functional Squats 15 reps   Forward Lunge 10 reps;Limitations   Forward Lunge Limitations on floor with 1 UE assist   Lumbar Exercises: Supine   Bent Knee Raise 15 reps   Bridge 15 reps   Straight Leg Raise 15 reps   Straight Leg Raises Limitations floating    Lumbar Exercises: Prone   Straight Leg Raise 10 reps   Lumbar Exercises: Quadruped   Opposite Arm/Leg Raise 10 reps   Manual Therapy   Manual Therapy Soft tissue mobilization   Manual therapy comments mid lumbar, B paraspinals   Muscle Energy Technique SI checked did not need adjustment                   PT Short Term Goals -  05/04/15 1646    PT SHORT TERM GOAL #1   Title Pt will be independent with HEP   Time 3   Period Weeks   Status New   PT SHORT TERM GOAL #2   Title Improve lumbar flexion to 70 degrees or greater to allow pt to bend forward to pick up objects off of the ground.    Time 3   Period Weeks   Status New   PT SHORT TERM GOAL #3   Title Improve bilateral hip strength to 4/5 grossly to improve functional mobility and gait mechanics.    Time 3   Period Weeks   Status New   PT SHORT TERM GOAL #4   Title Pt will demonstrate decreased tightness in bilateral cervical musculature to allow her to turn her head fully to L side to see while driving.    Time 3   Period Weeks   Status New           PT Long Term Goals - 05/04/15 1648    PT LONG TERM GOAL #1   Title Pt will be independent with advanced HEP that is updated PRN.    Time 6   Period Weeks   Status New   PT LONG TERM  GOAL #2   Title Improve lumbar flexion to 90 degrees without pain to allow for pt to bend forward to pick up objects off of the floor.    Time 6   Period Weeks   Status New   PT LONG TERM GOAL #3   Title Improve bilateral hip strength to 4+/5 or greater to improve gait mechanics and functional mobility.   Time 6   Period Weeks   Status New   PT LONG TERM GOAL #4   Title Pt will demonstrate proper alignment of SI joint without manual therapy to decrease back pain and to allow for pt to resume walking to improve health.    Time 6   Period Weeks   Status New   PT LONG TERM GOAL #5   Title Pt will demonstrate improved postural muscle strength evidenced by decreased muscle spasm in cervical spine and 0/10 neck pain to allow for pt to achieve full ROM in cervical spine and for decreasaed neck pain.    Time 6   Period Weeks   Additional Long Term Goals   Additional Long Term Goals Yes   PT LONG TERM GOAL #6   Title Pt will complete five time sit to stand in 12 seconds or less to demonstrate improved BLE strength and power.    Time 6   Period Weeks   Status New               Plan - 05/23/15 1605    Clinical Impression Statement Pt with mid lumbar pain today with good alighnment noted in sacroiliac region. Completed soft tissue massage to paraspinals with minimal tightness and no spasms palpated in region.  Continued with all exercises and stretches without c/o pain.  Pt reequired cues for form and proper exercise count.  No new actvities added tdoay other than progressing reps.  Pt reported no pain at end of session.    PT Next Visit Plan continue to progress towards goals.  Begin lunging activity next visit and progress to yoga poses as able         Problem List Patient Active Problem List   Diagnosis Date Noted  . IFG (impaired fasting glucose) 05/03/2015  . Psoriasis  05/03/2014  . Greater trochanteric bursitis of right hip 03/14/2014  . Obesity (BMI 30-39.9) 05/26/2013   . GERD (gastroesophageal reflux disease) 12/31/2010  . Hypothyroidism 12/31/2010  . Vitamin D deficiency 12/31/2010  . Pure hypercholesterolemia 12/31/2010    Teena Irani, PTA/CLT [phone] 05/23/2015, 4:13 PM   [address] [place], [place], [address] Phone: [phone]   Fax:  [phone]

## 2015-05-25 ENCOUNTER — Ambulatory Visit (HOSPITAL_COMMUNITY): Payer: Medicare Other

## 2015-05-25 DIAGNOSIS — R6889 Other general symptoms and signs: Secondary | ICD-10-CM

## 2015-05-25 DIAGNOSIS — M533 Sacrococcygeal disorders, not elsewhere classified: Secondary | ICD-10-CM

## 2015-05-25 DIAGNOSIS — M542 Cervicalgia: Secondary | ICD-10-CM | POA: Diagnosis not present

## 2015-05-25 DIAGNOSIS — M545 Low back pain, unspecified: Secondary | ICD-10-CM

## 2015-05-25 DIAGNOSIS — R262 Difficulty in walking, not elsewhere classified: Secondary | ICD-10-CM | POA: Diagnosis not present

## 2015-05-25 NOTE — Therapy (Signed)
[place], [place], [address] Phone: [phone]   Fax:  [phone]  Physical Therapy Treatment  Patient Details  Name: Mckenzie Webb MRN: [id number] Date of Birth: Jun 25, 1946 Referring Provider:  Rita Ohara, MD  Encounter Date: 05/25/2015      PT End of Session - 05/25/15 1556    Visit Number 7   Number of Visits 12   Date for PT Re-Evaluation 06/01/15   Authorization Type Medicare   Authorization Time Period 05/04/15-07/16/15   Authorization - Visit Number 7   Authorization - Number of Visits 20   PT Start Time 0045   PT Stop Time 1600   PT Time Calculation (min) 42 min   Activity Tolerance Patient tolerated treatment well   Behavior During Therapy [facility] for tasks assessed/performed      Past Medical History  Diagnosis Date  . Psoriasis   . GERD (gastroesophageal reflux disease)   . Allergy   . Interstitial cystitis   . Leukopenia   . Edema   . Hyperlipidemia   . Diabetes mellitus 4/07    pt reports that all subsequent tests were normal, not diabetic  . Obesity   . DJD (degenerative joint disease), cervical 04 2002  . Hypothyroid   . Prediabetes 06/2014    fasting glucose 109; A1c 5.7    Past Surgical History  Procedure Laterality Date  . Cholecystectomy    . Tubal ligation  1983  . Knee arthroscopy Right 1990  . Colonoscopy  3/09  . Varicose vein surgery  09/2009 R, 06/2010 L  . Knee arthroscopy Left 12/12/2010     (Dr. Noemi Chapel)    There were no vitals filed for this visit.  Visit Diagnosis:  Bilateral low back pain without sciatica  Difficulty walking  Sacroiliac joint dysfunction of left side  Decreased functional activity tolerance      Subjective Assessment - 05/25/15 1534    Subjective Pt stated lower back pain in center of back and Lt side, reports walking for 15 minutes today with no reports of increased pain today.   Currently in Pain? Yes   Pain Score 3    Pain  Location Back   Pain Orientation Lower;Left   Pain Descriptors / Indicators Sore           [facility] Adult PT Treatment/Exercise - 05/25/15 0001    Lumbar Exercises: Stretches   Piriformis Stretch 3 reps;30 seconds   Piriformis Stretch Limitations seated   Lumbar Exercises: Aerobic   Stationary Bike 5' @ 1.3 mph incline L3 with cueing for proper gait mechanics   Lumbar Exercises: Standing   Heel Raises 15 reps   Heel Raises Limitations with squats    Functional Squats 15 reps   Lifting From 12";15 reps   Lifting Limitations red theraball   Forward Lunge 10 reps;Limitations   Forward Lunge Limitations on floor with ab set no HHA   Side Lunge --  begin next session   Lumbar Exercises: Supine   Bent Knee Raise 15 reps;5 seconds   Bent Knee Raise Limitations dead bug    Bridge 15 reps   Straight Leg Raise 15 reps   Straight Leg Raises Limitations floating    Manual Therapy   Manual Therapy Muscle Energy Technique   Muscle Energy Technique MET for Lt anterior rotation f/b core strengthening and gait training           PT Short Term Goals - 05/04/15 1646  PT SHORT TERM GOAL #1   Title Pt will be independent with HEP   Time 3   Period Weeks   Status New   PT SHORT TERM GOAL #2   Title Improve lumbar flexion to 70 degrees or greater to allow pt to bend forward to pick up objects off of the ground.    Time 3   Period Weeks   Status New   PT SHORT TERM GOAL #3   Title Improve bilateral hip strength to 4/5 grossly to improve functional mobility and gait mechanics.    Time 3   Period Weeks   Status New   PT SHORT TERM GOAL #4   Title Pt will demonstrate decreased tightness in bilateral cervical musculature to allow her to turn her head fully to L side to see while driving.    Time 3   Period Weeks   Status New           PT Long Term Goals - 05/04/15 1648    PT LONG TERM GOAL #1   Title Pt will be independent with advanced HEP that is updated PRN.    Time 6    Period Weeks   Status New   PT LONG TERM GOAL #2   Title Improve lumbar flexion to 90 degrees without pain to allow for pt to bend forward to pick up objects off of the floor.    Time 6   Period Weeks   Status New   PT LONG TERM GOAL #3   Title Improve bilateral hip strength to 4+/5 or greater to improve gait mechanics and functional mobility.   Time 6   Period Weeks   Status New   PT LONG TERM GOAL #4   Title Pt will demonstrate proper alignment of SI joint without manual therapy to decrease back pain and to allow for pt to resume walking to improve health.    Time 6   Period Weeks   Status New   PT LONG TERM GOAL #5   Title Pt will demonstrate improved postural muscle strength evidenced by decreased muscle spasm in cervical spine and 0/10 neck pain to allow for pt to achieve full ROM in cervical spine and for decreasaed neck pain.    Time 6   Period Weeks   Additional Long Term Goals   Additional Long Term Goals Yes   PT LONG TERM GOAL #6   Title Pt will complete five time sit to stand in 12 seconds or less to demonstrate improved BLE strength and power.    Time 6   Period Weeks   Status New               Plan - 05/25/15 1557    Clinical Impression Statement Muscle energy technique for Lt SI anterior rotation followed by core strengthening and gait training to keep SI intact.  Pt stated she felt decrease in overall pain though felt tight in piriformis and lateral hip.  Verbal and tactile cueing required to improve  core stability with exercises.  Progressed to forward lunges with no HHA for increase core demand.  Pt reports pain reduced to 2/10 at end of session.     PT Next Visit Plan Continue to progress towards goals.  Begin side lunges next session and progress to yoga poses when able.  F/U with MET PRN.        Problem List Patient Active Problem List   Diagnosis Date Noted  . IFG (impaired fasting  glucose) 05/03/2015  . Psoriasis 05/03/2014  . Greater  trochanteric bursitis of right hip 03/14/2014  . Obesity (BMI 30-39.9) 05/26/2013  . GERD (gastroesophageal reflux disease) 12/31/2010  . Hypothyroidism 12/31/2010  . Vitamin D deficiency 12/31/2010  . Pure hypercholesterolemia 12/31/2010   Ihor Austin, [place]; Anthon  Aldona Lento 05/25/2015, 4:04 PM  [place] [address] [place], [place], [address] Phone: [phone]   Fax:  [phone]

## 2015-05-30 ENCOUNTER — Ambulatory Visit (HOSPITAL_COMMUNITY): Payer: Medicare Other | Admitting: Physical Therapy

## 2015-05-30 DIAGNOSIS — M545 Low back pain, unspecified: Secondary | ICD-10-CM

## 2015-05-30 DIAGNOSIS — R6889 Other general symptoms and signs: Secondary | ICD-10-CM

## 2015-05-30 DIAGNOSIS — M542 Cervicalgia: Secondary | ICD-10-CM | POA: Diagnosis not present

## 2015-05-30 DIAGNOSIS — R262 Difficulty in walking, not elsewhere classified: Secondary | ICD-10-CM

## 2015-05-30 DIAGNOSIS — M533 Sacrococcygeal disorders, not elsewhere classified: Secondary | ICD-10-CM | POA: Diagnosis not present

## 2015-05-30 NOTE — Therapy (Signed)
[place], [place], [address] Phone: [phone]   Fax:  [phone]  Physical Therapy Treatment  Patient Details  Name: Mckenzie Webb MRN: [id number] Date of Birth: [birth year] Referring Provider:  Rita Ohara, MD  Encounter Date: 05/30/2015      PT End of Session - 05/30/15 1557    Visit Number 8   Number of Visits 12   Date for PT Re-Evaluation 06/01/15   Authorization Type Medicare   Authorization Time Period 05/04/15-07/16/15   Authorization - Visit Number 8   Authorization - Number of Visits 10   PT Start Time 3154   PT Stop Time 1555   PT Time Calculation (min) 38 min   Activity Tolerance Patient tolerated treatment well   Behavior During Therapy [facility] for tasks assessed/performed      Past Medical History  Diagnosis Date  . Psoriasis   . GERD (gastroesophageal reflux disease)   . Allergy   . Interstitial cystitis   . Leukopenia   . Edema   . Hyperlipidemia   . Diabetes mellitus 4/07    pt reports that all subsequent tests were normal, not diabetic  . Obesity   . DJD (degenerative joint disease), cervical 04 2002  . Hypothyroid   . Prediabetes 06/2014    fasting glucose 109; A1c 5.7    Past Surgical History  Procedure Laterality Date  . Cholecystectomy    . Tubal ligation  1983  . Knee arthroscopy Right 1990  . Colonoscopy  3/09  . Varicose vein surgery  09/2009 R, 06/2010 L  . Knee arthroscopy Left 12/12/2010     (Dr. Noemi Chapel)    There were no vitals filed for this visit.  Visit Diagnosis:  Bilateral low back pain without sciatica  Difficulty walking  Sacroiliac joint dysfunction of left side  Decreased functional activity tolerance      Subjective Assessment - 05/30/15 1519    Subjective Patient reports that she is doing well today, had a tiring weekend as they had visitors   Currently in Pain? Yes   Pain Score 2    Pain Location Back   Pain Orientation Lower                          [facility] Adult PT Treatment/Exercise - 05/30/15 0001    Lumbar Exercises: Stretches   Active Hamstring Stretch 3 reps;30 seconds   Active Hamstring Stretch Limitations 12" step   Passive Hamstring Stretch 3 reps;30 seconds   Passive Hamstring Stretch Limitations gastroc on slantboard    Piriformis Stretch 3 reps;30 seconds   Piriformis Stretch Limitations seated   Lumbar Exercises: Standing   Heel Raises 15 reps   Heel Raises Limitations unilateral    Functional Squats 15 reps   Forward Lunge 10 reps;Limitations   Forward Lunge Limitations on floor with ab set no HHA   Side Lunge 10 reps;Limitations   Side Lunge Limitations B HHA, cues for form    Other Standing Lumbar Exercises 3D hip excursions 1x15   Other Standing Lumbar Exercises Hip ABD walks 2x22f; sit to stand with staggered stance 1x10 each side; SLS vectors x5 each side    Lumbar Exercises: Supine   Ab Set 20 reps;3 seconds   Bridge 15 reps   Bridge Limitations L LE closer to rear    Straight Leg Raise 15 reps   Straight Leg Raises Limitations floating, R LE only  Lumbar Exercises: Sidelying   Hip Abduction 10 reps   Manual Therapy   Muscle Energy Technique MET for L anterior rotation                 PT Education - 05/30/15 1557    Education provided No          PT Short Term Goals - 05/04/15 1646    PT SHORT TERM GOAL #1   Title Pt will be independent with HEP   Time 3   Period Weeks   Status New   PT SHORT TERM GOAL #2   Title Improve lumbar flexion to 70 degrees or greater to allow pt to bend forward to pick up objects off of the ground.    Time 3   Period Weeks   Status New   PT SHORT TERM GOAL #3   Title Improve bilateral hip strength to 4/5 grossly to improve functional mobility and gait mechanics.    Time 3   Period Weeks   Status New   PT SHORT TERM GOAL #4   Title Pt will demonstrate decreased tightness in bilateral cervical musculature to allow  her to turn her head fully to L side to see while driving.    Time 3   Period Weeks   Status New           PT Long Term Goals - 05/04/15 1648    PT LONG TERM GOAL #1   Title Pt will be independent with advanced HEP that is updated PRN.    Time 6   Period Weeks   Status New   PT LONG TERM GOAL #2   Title Improve lumbar flexion to 90 degrees without pain to allow for pt to bend forward to pick up objects off of the floor.    Time 6   Period Weeks   Status New   PT LONG TERM GOAL #3   Title Improve bilateral hip strength to 4+/5 or greater to improve gait mechanics and functional mobility.   Time 6   Period Weeks   Status New   PT LONG TERM GOAL #4   Title Pt will demonstrate proper alignment of SI joint without manual therapy to decrease back pain and to allow for pt to resume walking to improve health.    Time 6   Period Weeks   Status New   PT LONG TERM GOAL #5   Title Pt will demonstrate improved postural muscle strength evidenced by decreased muscle spasm in cervical spine and 0/10 neck pain to allow for pt to achieve full ROM in cervical spine and for decreasaed neck pain.    Time 6   Period Weeks   Additional Long Term Goals   Additional Long Term Goals Yes   PT LONG TERM GOAL #6   Title Pt will complete five time sit to stand in 12 seconds or less to demonstrate improved BLE strength and power.    Time 6   Period Weeks   Status New               Plan - 05/30/15 1557    Clinical Impression Statement Corrected L anterior SI rottation today with MET, followed up with exercises to help reduce muscle imbalance. Otherwise continued functional stretches and exercises today with good tolerance by patient, hwoever she did require some cues for proper form especially with new exercises/activities.    Pt will benefit from skilled therapeutic intervention in order to improve on  the following deficits Abnormal gait;Decreased activity tolerance;Decreased range of  motion;Decreased strength;Difficulty walking;Increased muscle spasms;Postural dysfunction;Pain   Rehab Potential Good   PT Frequency 2x / week   PT Duration 6 weeks   PT Treatment/Interventions ADLs/Self Care Home Management;Moist Heat;Gait training;Functional mobility training;Stair training;Therapeutic activities;Therapeutic exercise;Balance training;Neuromuscular re-education;Patient/family education;Manual techniques   PT Next Visit Plan RE-assess    Consulted and Agree with Plan of Care Patient        Problem List Patient Active Problem List   Diagnosis Date Noted  . IFG (impaired fasting glucose) 05/03/2015  . Psoriasis 05/03/2014  . Greater trochanteric bursitis of right hip 03/14/2014  . Obesity (BMI 30-39.9) 05/26/2013  . GERD (gastroesophageal reflux disease) 12/31/2010  . Hypothyroidism 12/31/2010  . Vitamin D deficiency 12/31/2010  . Pure hypercholesterolemia 12/31/2010    Deniece Ree PT, DPT [phone]  [facility] [address] [place], [place], [address] Phone: [phone]   Fax:  [phone]

## 2015-06-01 ENCOUNTER — Ambulatory Visit (HOSPITAL_COMMUNITY): Payer: Medicare Other | Admitting: Physical Therapy

## 2015-06-01 DIAGNOSIS — M533 Sacrococcygeal disorders, not elsewhere classified: Secondary | ICD-10-CM

## 2015-06-01 DIAGNOSIS — R262 Difficulty in walking, not elsewhere classified: Secondary | ICD-10-CM | POA: Diagnosis not present

## 2015-06-01 DIAGNOSIS — M545 Low back pain, unspecified: Secondary | ICD-10-CM

## 2015-06-01 DIAGNOSIS — R6889 Other general symptoms and signs: Secondary | ICD-10-CM | POA: Diagnosis not present

## 2015-06-01 DIAGNOSIS — M542 Cervicalgia: Secondary | ICD-10-CM | POA: Diagnosis not present

## 2015-06-01 NOTE — Patient Instructions (Signed)
SI joint Muscle energy for R Posterior/L anterior rotation  Lay with knees bent and feet flat.   With feet flat, dig left heel into surface like you are trying to lift your butt off of the table without fully lifting up.  At the same time, place your hand on your right thigh and try to lift your leg off of the table, resisting with your right hand.   QUADRUPED ALTERNATE ARM AND LEG "BIRD DOG"  While in a crawling position, slowly draw your leg and opposite arm upwards.    Your arm and leg should be straight and fully out-stretched.

## 2015-06-01 NOTE — Therapy (Signed)
[place], [place], [address] Phone: [phone]   Fax:  [phone]  Physical Therapy Treatment  Patient Details  Name: Mckenzie Webb MRN: [id number] Date of Birth: 1945/10/25 Referring Provider:  Rita Ohara, MD  Encounter Date: 06/01/2015      PT End of Session - 06/01/15 1527    Visit Number 9   Number of Visits 12   Date for PT Re-Evaluation 06/30/15   Authorization Type Medicare   Authorization Time Period 05/04/15-07/16/15   Authorization - Visit Number 9   Authorization - Number of Visits 19   PT Start Time 5573   PT Stop Time 1515   PT Time Calculation (min) 41 min   Equipment Utilized During Treatment Gait belt   Activity Tolerance Patient tolerated treatment well   Behavior During Therapy [facility] for tasks assessed/performed      Past Medical History  Diagnosis Date  . Psoriasis   . GERD (gastroesophageal reflux disease)   . Allergy   . Interstitial cystitis   . Leukopenia   . Edema   . Hyperlipidemia   . Diabetes mellitus 4/07    pt reports that all subsequent tests were normal, not diabetic  . Obesity   . DJD (degenerative joint disease), cervical 04 2002  . Hypothyroid   . Prediabetes 06/2014    fasting glucose 109; A1c 5.7    Past Surgical History  Procedure Laterality Date  . Cholecystectomy    . Tubal ligation  1983  . Knee arthroscopy Right 1990  . Colonoscopy  3/09  . Varicose vein surgery  09/2009 R, 06/2010 L  . Knee arthroscopy Left 12/12/2010     (Dr. Noemi Chapel)    There were no vitals filed for this visit.  Visit Diagnosis:  Bilateral low back pain without sciatica  Difficulty walking  Sacroiliac joint dysfunction of left side  Decreased functional activity tolerance      Subjective Assessment - 06/01/15 1437    Subjective Pt reports that she feels that she is not as sore when she wakes up in the morning, and she has had times where she is pain free. She has  noticed improvements in walking and is able to get her housework done with less difficulty. She continues to have difficulty with lifting or carrying objects.    How long can you sit comfortably? 30 minutes   How long can you stand comfortably? several hours   How long can you walk comfortably? several hours   Currently in Pain? Yes   Pain Score 2    Pain Location Back   Pain Orientation Lower            [facility] PT Assessment - 06/01/15 0001    AROM   Lumbar Flexion 85   Lumbar Extension 21   Lumbar - Right Side Bend 25   Lumbar - Left Side Bend 20   Strength   Right Hip Flexion 4/5   Right Hip Extension 4/5   Right Hip ABduction 4+/5   Left Hip Flexion 4+/5   Left Hip Extension 4/5   Left Hip ABduction 4+/5   Right Knee Flexion 5/5   Right Knee Extension 5/5   Left Knee Flexion 5/5   Left Knee Extension 5/5   Transfers   Five time sit to stand comments  12.29"                     [facility] Adult PT  Treatment/Exercise - 06/01/15 0001    Lumbar Exercises: Stretches   Active Hamstring Stretch 3 reps;30 seconds   Active Hamstring Stretch Limitations 12" step   Passive Hamstring Stretch 3 reps;30 seconds   Passive Hamstring Stretch Limitations gastroc on slantboard    Lumbar Exercises: Supine   Bent Knee Raise 15 reps;5 seconds   Bent Knee Raise Limitations dead bug    Lumbar Exercises: Prone   Straight Leg Raise 10 reps   Manual Therapy   Muscle Energy Technique MET for L anterior rotation                 PT Education - 06/01/15 1523    Education provided Yes   Education Details HEP updated to include MET   Person(s) Educated Patient   Methods Explanation;Handout   Comprehension Verbalized understanding;Returned demonstration          PT Short Term Goals - 06/01/15 1527    PT SHORT TERM GOAL #1   Title Pt will be independent with HEP   Time 3   Period Weeks   Status Achieved   PT SHORT TERM GOAL #2   Title Improve lumbar flexion to 70  degrees or greater to allow pt to bend forward to pick up objects off of the ground.    Time 3   Period Weeks   Status Achieved   PT SHORT TERM GOAL #3   Title Improve bilateral hip strength to 4/5 grossly to improve functional mobility and gait mechanics.    Time 3   Period Weeks   Status Achieved   PT SHORT TERM GOAL #4   Title Pt will demonstrate decreased tightness in bilateral cervical musculature to allow her to turn her head fully to L side to see while driving.    Time 3   Period Weeks   Status On-going           PT Long Term Goals - 06/01/15 1528    PT LONG TERM GOAL #1   Title Pt will be independent with advanced HEP that is updated PRN.    Time 6   Period Weeks   Status Achieved   PT LONG TERM GOAL #2   Title Improve lumbar flexion to 90 degrees without pain to allow for pt to bend forward to pick up objects off of the floor.    Time 6   Period Weeks   Status On-going   PT LONG TERM GOAL #3   Title Improve bilateral hip strength to 4+/5 or greater to improve gait mechanics and functional mobility.   Time 6   Period Weeks   Status On-going   PT LONG TERM GOAL #4   Title Pt will demonstrate proper alignment of SI joint without manual therapy to decrease back pain and to allow for pt to resume walking to improve health.    Time 6   Period Weeks   Status Achieved   PT LONG TERM GOAL #5   Title Pt will demonstrate improved postural muscle strength evidenced by decreased muscle spasm in cervical spine and 0/10 neck pain to allow for pt to achieve full ROM in cervical spine and for decreasaed neck pain.    Time 6   Period Weeks   Status On-going   PT LONG TERM GOAL #6   Title Pt will complete five time sit to stand in 12 seconds or less to demonstrate improved BLE strength and power.    Time 6   Period Weeks  Status Achieved               Plan - Jun 19, 2015 1530    Clinical Impression Statement Reassessment was completed today. Pt demonstrates  improvements in strength, functional mobility, functional activity tolerance, and pain levels. Pt was educated in completing MET as HEP to reduce back pain. Continuing PT services was discussed with pt, along with the option of continuing with HEP and joining the [facility]. Pt has not yet met her LTGs set for PT, and would benefit from continued core strengthening, however, it pt remains compliant with HEP and with a fitness program, she could continue independently. Pt states that she wants to speak to her doctor at her follow up next week, and she will call to inform us of her decision.    PT Next Visit Plan Continue with core strengthening if pt wishes to continue with PT          G-Codes - 2015-06-19 1545    Functional Assessment Tool Used FOTO   Functional Limitation Mobility: Walking and moving around   Mobility: Walking and Moving Around Current Status [phone]) At least 20 percent but less than 40 percent impaired, limited or restricted   Mobility: Walking and Moving Around Goal Status [phone]) At least 1 percent but less than 20 percent impaired, limited or restricted      Problem List Patient Active Problem List   Diagnosis Date Noted  . IFG (impaired fasting glucose) 05/03/2015  . Psoriasis 05/03/2014  . Greater trochanteric bursitis of right hip 03/14/2014  . Obesity (BMI 30-39.9) 05/26/2013  . GERD (gastroesophageal reflux disease) 12/31/2010  . Hypothyroidism 12/31/2010  . Vitamin D deficiency 12/31/2010  . Pure hypercholesterolemia 12/31/2010    Hilma Favors, PT, DPT [phone] 06-19-15, 3:51 PM  [place] [address] [place], [place], [address] Phone: [phone]   Fax:  [phone]

## 2015-06-07 ENCOUNTER — Ambulatory Visit: Payer: Self-pay | Admitting: Family Medicine

## 2015-06-07 ENCOUNTER — Encounter: Payer: Self-pay | Admitting: Family Medicine

## 2015-06-07 ENCOUNTER — Ambulatory Visit (INDEPENDENT_AMBULATORY_CARE_PROVIDER_SITE_OTHER): Payer: Medicare Other | Admitting: Family Medicine

## 2015-06-07 VITALS — BP 140/84 | HR 68 | Temp 98.7°F | Ht 65.0 in | Wt 185.4 lb

## 2015-06-07 DIAGNOSIS — R7301 Impaired fasting glucose: Secondary | ICD-10-CM

## 2015-06-07 DIAGNOSIS — E039 Hypothyroidism, unspecified: Secondary | ICD-10-CM | POA: Diagnosis not present

## 2015-06-07 DIAGNOSIS — R059 Cough, unspecified: Secondary | ICD-10-CM

## 2015-06-07 DIAGNOSIS — E559 Vitamin D deficiency, unspecified: Secondary | ICD-10-CM

## 2015-06-07 DIAGNOSIS — R05 Cough: Secondary | ICD-10-CM | POA: Diagnosis not present

## 2015-06-07 DIAGNOSIS — I10 Essential (primary) hypertension: Secondary | ICD-10-CM | POA: Diagnosis not present

## 2015-06-07 MED ORDER — LOSARTAN POTASSIUM 50 MG PO TABS: ORAL_TABLET | ORAL | Status: DC

## 2015-06-07 MED ORDER — BENZONATATE 200 MG PO CAPS: 200.0000 mg | ORAL_CAPSULE | Freq: Three times a day (TID) | ORAL | Status: DC | PRN

## 2015-06-07 NOTE — Patient Instructions (Addendum)
  Start losartan 17m tablets. Start at 1/2 tablet once daily in the morning.  Continue to monitor your blood pressure regularly.  If your blood pressure remains >135-140/85-90 after 2 weeks, then increase to the full tablet every day.  If BP is <140/90 most of the time, then okay to wait until your December physical. If your BP is still >140/90, return in 4 weeks (don't wait until December).   Impaired fasting glucose/pre-diabetes. Limit your sweets, sugars, carbs (breads/pasta/rice, etc), exercise daily and try to lose weight.  This should help keep your sugars low.  Your cough is likely related to postnasal drainage, rather than anything in your lungs themselves.  Your have some sinus pressure, which is likely draining down the throat. Drink plenty of water. Start taking Mucinex (plain) as directed on the package (I recommend the twice daily form). Take the tessalon as needed to stop the cough.  If this is not effective in helping your cough, you can switch to Delsym Syrup (and continue the Mucinex). If your cough persists, and/or if you have increased amounts of yellow phlegm, or worsening sinus pressure, call for antibiotic prescription (next week). Consider trying sinus rinses (ie Neti-pot).

## 2015-06-07 NOTE — Progress Notes (Signed)
Chief Complaint  Patient presents with  . Follow-up    on hypertension. Also wants to know if you can change her nexium to omeprazole (rx sent in) much cheaper with new insurance (Medicare). Won't need until December.   . Cough    x 10 days. Thinks she may have bronchitis as her chest and back hurts when she coughs. Facial pain and pressure. Yellow mucus, low grade fevers.     Patient presents to f/u on her blood pressure.  She has never been on BP meds.  Has been trying to follow a low sodium diet. She has documented her pain level with her monitoring of her BP.  She completed a course of PT, and back pain is now down to 2/10, much improved.  BP's have mainly been running mostly 140's-160's/80's (max of 94 diastolic).  She has a few sporadic BP's that are <120, a few in the 130's, but most are >413 systolic.  Pain levels over the last couple of weeks have been down to 2/10. She has had cough during this time as well--see below.  She had labs drawn at work in September, and results were faxed--see below.  She has had a cough for almost 2 weeks. Cough is mostly nonproductive, only occasionally gets up some yellowish phlegm.  She has occasional sneeze, no runny nose or eye symptoms.  She tried Coricidin HBP but it made her BP high (180's/108).  It came back down when she stopped taking it.  She was afraid to try any other OTC medications. She doesn't feel like the cough is coming from reflux. No fever, chills, shortness of breath.  GERD--asking for rx omeprazole for after her insurance changes.  She just got a 90 day rx of Nexium filled.  She will need the new prescription when she comes for her physical in December.  Vitamin D deficiency:  Slightly low on last check (see below).  She hadn't been taking D separately as regularly as she had in the past, but had been taking Calcium with D and MVI daily.  Since getting the borderline low value, she has been taking a separate D daily again.   PMH, PSH,  SH reviewed.  Outpatient Encounter Prescriptions as of 06/07/2015  Medication Sig Note  . Acetaminophen (TYLENOL ARTHRITIS PAIN PO) Take 2 tablets by mouth as needed.   . Calcium-Vitamin D (CALTRATE 600 PLUS-VIT D PO) Take 1 tablet by mouth daily.   07/27/2014: Takes daily  . Cholecalciferol (VITAMIN D) 1000 UNITS capsule Take 1,000 Units by mouth daily.     . clobetasol cream (TEMOVATE) 0.05 % Apply topically 2 (two) times daily as needed. Use prn for psoriasis; use sparingly   . co-enzyme Q-10 30 MG capsule Take 100 mg by mouth daily.     . Cyanocobalamin (VITAMIN B 12 PO) Take 1 tablet by mouth.     . fish oil-omega-3 fatty acids 1000 MG capsule Take 1.2 g by mouth daily.   Marland Kitchen levothyroxine (SYNTHROID, LEVOTHROID) 88 MCG tablet Take 1 tablet (88 mcg total) by mouth daily.   . Multiple Vitamins-Minerals (ALIVE WOMENS 50+ PO) Take by mouth.   Marland Kitchen NEXIUM 40 MG capsule Take 1 capsule (40 mg total) by mouth daily.   . Red Yeast Rice Extract (RED YEAST RICE PO) Take 4 tablets by mouth daily.     . [DISCONTINUED] Ibuprofen-Diphenhydramine HCl 200-25 MG CAPS Take 2 tablets by mouth as needed. 04/05/2015: Took one last night  . [DISCONTINUED] Naproxen Sodium (ALEVE)  220 MG CAPS Take 1 capsule by mouth 2 (two) times daily.    No facility-administered encounter medications on file as of 06/07/2015.   Allergies  Allergen Reactions  . Ciprofloxacin     After she takes for a while it gives her a "funny feeling."  . Codeine Nausea Only  . Iodine Swelling  . Polysporin [Bacitracin-Polymyxin B] Swelling    Used for eye infection--got redder and worse  . Sulfa Antibiotics Swelling    Lips swell, itching  . Penicillins Rash   ROS: no fever, chills, headaches, ear pain, sore throat, chest pain, palpitations, dizziness, nausea, vomiting, bowel changes, bleeding, bruising, rash, urinary complaints or other problems except as noted in HPI.  Low back pain significantly improved since getting PT, down to  2/10.  PHYSICAL EXAM: BP 160/90 mmHg  Pulse 68  Temp(Src) 98.7 F (37.1 C) (Tympanic)  Ht 5' 5"  (1.651 m)  Wt 185 lb 6.4 oz (84.097 kg)  BMI 30.85 kg/m2 140/84 on repeat by MD, RA  Dry, hacky cough during visit. She is well-appearing, and speaking easily, in no distress HEENT: PERRL, EOMI, conjunctiva clear. TM's and EACs are normal.  Tender over R maxillary sinus. Nasal mucosa is only minimally inflamed, no erythema, and no purulence. OP is clear Neck: no lymphadenopathy or mass Heart: regular rate and rhythm Lungs clear bilaterally. No wheezes, rales, ronchi. No cough with forced expiration Extremities: no edema, normal pulses Skin: no rashes, normal turgor Neuro: alert and oriented. Cranial nerves intact, normal strength and gait Psych: normal mood, affect, hygiene and grooming  Labs reviewed (from 04/2015, done at work--see scanned forms). CBC normal except WBC 3.3 (ranges 3.8-4.6 in the past here). Hg 14.1 Fasting glucose 107, remainder of c-met was normal HbA1c 5.8 Tchol 230, TG 123, HDL 74, LDL 131 Vitamin D 29.5 TSH 1.390  ASSESSMENT/PLAN:  Essential hypertension - persistently elevated BP despite improved pain. Given cough, avoid ACEI and start ARB. Start 1/2 tab and increase to full if BP remains elevated. SE reviewed - Plan: losartan (COZAAR) 50 MG tablet  Cough - suspect related to PND, allergies. Mucinex, sinus rinses, tessalon prn.  Call for ABX if persistent/worsening/purulence - Plan: benzonatate (TESSALON) 200 MG capsule  IFG (impaired fasting glucose) - counseled re: diet, exercise, weight loss  Hypothyroidism, unspecified hypothyroidism type - adequately replaced  Vitamin D deficiency - slightly low last month. Take Vit D supplement daily (plus MVI and calcium +D)  Impaired fasting glucose-- Reviewed diet, exercise, weight loss F/u in December as scheduled for CPE  HTN:  Given her IFG, recommend ACEI or ARB.  Given cough, start with ARB.  Start  losartan 102m tablets. Start at 1/2 tablet once daily in the morning.  Continue to monitor your blood pressure regularly.  If your blood pressure remains >135-140/85-90 after 2 weeks, then increase to the full tablet every day.  If BP is <140/90 most of the time, then okay to wait until your December physical. If your BP is still >140/90, return in 4 weeks (don't wait until December).  Your cough is likely related to postnasal drainage, rather than anything in your lungs themselves.  Your have some sinus pressure, which is likely draining down the throat. Drink plenty of water. Start taking Mucinex (plain) as directed on the package (I recommend the twice daily form). Take the tessalon as needed to stop the cough.  If this is not effective in helping your cough, you can switch to Delsym Syrup (and continue the Mucinex).  If your cough persists, and/or if you have increased amounts of yellow phlegm, or worsening sinus pressure, call for antibiotic prescription (next week). Consider trying sinus rinses (ie Neti-pot).

## 2015-06-12 ENCOUNTER — Telehealth: Payer: Self-pay

## 2015-06-12 DIAGNOSIS — J40 Bronchitis, not specified as acute or chronic: Principal | ICD-10-CM

## 2015-06-12 DIAGNOSIS — J329 Chronic sinusitis, unspecified: Secondary | ICD-10-CM

## 2015-06-12 MED ORDER — AZITHROMYCIN 250 MG PO TABS: ORAL_TABLET | ORAL | Status: DC

## 2015-06-12 NOTE — Telephone Encounter (Signed)
Please advise pt that z-pak was sent to her pharmacy.

## 2015-06-12 NOTE — Telephone Encounter (Signed)
Patient advised.

## 2015-06-12 NOTE — Telephone Encounter (Signed)
Pt called saying she was to call back if she wasn't better any better this week. She would like the antibiotic that was discussed.

## 2015-06-21 ENCOUNTER — Encounter: Payer: Self-pay | Admitting: Family Medicine

## 2015-06-22 ENCOUNTER — Encounter: Payer: Self-pay | Admitting: Family Medicine

## 2015-06-22 ENCOUNTER — Ambulatory Visit (INDEPENDENT_AMBULATORY_CARE_PROVIDER_SITE_OTHER): Payer: Medicare Other | Admitting: Family Medicine

## 2015-06-22 VITALS — BP 122/72 | HR 68 | Temp 98.6°F | Ht 65.0 in | Wt 183.6 lb

## 2015-06-22 DIAGNOSIS — R51 Headache: Secondary | ICD-10-CM | POA: Diagnosis not present

## 2015-06-22 DIAGNOSIS — R197 Diarrhea, unspecified: Secondary | ICD-10-CM

## 2015-06-22 DIAGNOSIS — R05 Cough: Secondary | ICD-10-CM | POA: Diagnosis not present

## 2015-06-22 DIAGNOSIS — J40 Bronchitis, not specified as acute or chronic: Secondary | ICD-10-CM | POA: Diagnosis not present

## 2015-06-22 DIAGNOSIS — R059 Cough, unspecified: Secondary | ICD-10-CM

## 2015-06-22 DIAGNOSIS — R519 Headache, unspecified: Secondary | ICD-10-CM

## 2015-06-22 DIAGNOSIS — J329 Chronic sinusitis, unspecified: Secondary | ICD-10-CM

## 2015-06-22 MED ORDER — METHYLPREDNISOLONE ACETATE 80 MG/ML IJ SUSP
80.0000 mg | Freq: Once | INTRAMUSCULAR | Status: AC
  Administered 2015-06-22: 80 mg via INTRAMUSCULAR

## 2015-06-22 NOTE — Patient Instructions (Addendum)
  Continue to drink plenty of fluid, and do the sinus rinses. Continue the mucinex, and cough suppressants as needed. We are giving you a steroid injection today, to help with the sinuses, cough and inflammation in your muscles, causing a lot of your pain (headaches, neck pain). This is NOT treating an infection, so if it seems like your sinus infection isn't improving--ie recurrence or worsening of discolored mucus (either from nose or coughing it up), please call us for a change in antibiotic.  I don't want to start a new, strong antibiotic worse as I'm not 100% sure it is needed, and it will worsen your diarrhea.  We can add it early next week if you aren't better.   Continue probiotic. Continue imodium, as needed.  Stop the imodium and call us/see [place] immediately if you develop high fever, abdominal pain, bloody stools. Avoid all dairy for at least 5 days after your diarrhea resolves. (no cheese!)  You can take a break from checking your blood pressure. When you are feeling better, resume checking it 2-3 times/week (and add a "comments" column as we discussed.)

## 2015-06-22 NOTE — Progress Notes (Signed)
Chief Complaint  Patient presents with  . Cough    still has cough, HA constantly(back of her head) and has been having diarrhea x 2 weeks. Everything she eats goes through her. Thinks she really has not felt well since taking flu shot.     Seen 10/19: She has had a cough for almost 2 weeks. Cough is mostly nonproductive, only occasionally gets up some yellowish phlegm. She has occasional sneeze, no runny nose or eye symptoms. She tried Coricidin HBP but it made her BP high (180's/108). It came back down when she stopped taking it. She was afraid to try any other OTC medications. She doesn't feel like the cough is coming from reflux. No fever, chills, shortness of breath.  She was put on tessalon, and then started on a z-pak 10/24 when she called to say that she was no better. She hasn't noticed any improvement. Denies any fevers, productive She is now complaining of headache and worsening diarrhea.  Her "head hurts"--posteriorly, also has a tender spot on her left scalp, and she has a lot of pain around her eyes. Doesn't get much nasal drainage, is slightly yellow when there is some.  Doing sinus rinses, and mucus is clear.  Her cough isn't as bad as it was before, but has persistent cough. She took the tessalon for a week, and is now using Delsym (didn't take it yet today).  She is complaining of watery diarrhea.  Diarrhea has been worse since being on the antibiotic, but now reports that she had some loose stools starting about 2 weeks ago. She just started back on the probiotic this week. She took imodium last night. No high fever, abdominal pain or bloody stool. Grilled cheese yesterday, cheese toast.  PMH, PSH, SH reviewed.  Outpatient Encounter Prescriptions as of 06/22/2015  Medication Sig Note  . acetaminophen (TYLENOL) 500 MG tablet Take 1,000 mg by mouth every 6 (six) hours as needed.   . Calcium-Vitamin D (CALTRATE 600 PLUS-VIT D PO) Take 1 tablet by mouth daily.   07/27/2014:  Takes daily  . Cholecalciferol (VITAMIN D) 1000 UNITS capsule Take 1,000 Units by mouth daily.     Marland Kitchen co-enzyme Q-10 30 MG capsule Take 100 mg by mouth daily.     . fish oil-omega-3 fatty acids 1000 MG capsule Take 1.2 g by mouth daily.   Marland Kitchen levothyroxine (SYNTHROID, LEVOTHROID) 88 MCG tablet Take 1 tablet (88 mcg total) by mouth daily.   Marland Kitchen losartan (COZAAR) 50 MG tablet Start at 1/2 tablet by mouth once daily. Increase after 2 weeks as directed, if BP remains above goal   . Multiple Vitamins-Minerals (ALIVE WOMENS 50+ PO) Take by mouth.   Marland Kitchen NEXIUM 40 MG capsule Take 1 capsule (40 mg total) by mouth daily.   . Red Yeast Rice Extract (RED YEAST RICE PO) Take 4 tablets by mouth daily.     . Acetaminophen (TYLENOL ARTHRITIS PAIN PO) Take 2 tablets by mouth as needed.   . clobetasol cream (TEMOVATE) 0.05 % Apply topically 2 (two) times daily as needed. Use prn for psoriasis; use sparingly (Patient not taking: Reported on 06/22/2015)   . Cyanocobalamin (VITAMIN B 12 PO) Take 1 tablet by mouth.     . [DISCONTINUED] azithromycin (ZITHROMAX) 250 MG tablet Take 2 tablets by mouth on first day, then 1 tablet by mouth on days 2 through 5   . [DISCONTINUED] benzonatate (TESSALON) 200 MG capsule Take 1 capsule (200 mg total) by mouth 3 (three) times  daily as needed.   . [EXPIRED] methylPREDNISolone acetate (DEPO-MEDROL) injection 80 mg     No facility-administered encounter medications on file as of 06/22/2015.   Allergies  Allergen Reactions  . Ciprofloxacin     After she takes for a while it gives her a "funny feeling."  . Codeine Nausea Only  . Iodine Swelling  . Polysporin [Bacitracin-Polymyxin B] Swelling    Used for eye infection--got redder and worse  . Sulfa Antibiotics Swelling    Lips swell, itching  . Penicillins Rash   ROS:  No fever, chills. +headaches, URI symptoms/cough and diarrhea as per hPI.   Denies urinary complaints bleeding, bruising, rash, dizziness, chest pain or shortness of  breath  PHYSICAL EXAM: BP 122/72 mmHg  Pulse 68  Temp(Src) 98.6 F (37 C) (Tympanic)  Ht 5' 5"  (1.651 m)  Wt 183 lb 9.6 oz (83.28 kg)  BMI 30.55 kg/m2  Frequent dry cough.  Appears irritable, frustrated with being sick HEENT: PERRL, EOMI, conjunctiva and sclera are clear. TM's and EAC are normal. OP is clear. Nasal mucosa is mildly edematous, pale, no erythema or purulence.  Diffusely tender across frontal and maxillary sinuses bilaterally.  Neck: no lymphadenopathy or mass. +diffusely tender over trapezius muscles and paraspinous muscles, as well as up onto scalp. She is tender at temporalis muscles bilaterally. Heart: regular rate and rhythm without murmur. Good air movement No wheezes, rales, ronchi Abdomen: active bowel sounds, nontender, no mass Skin: no rashes, bruising Psych: normal mood (frustrated), affect, hygiene and grooming Neuro: alert and oriented. Cranial nerves intact. Normal strength, gait.  ASSESSMENT/PLAN:  Cough - dry; trial of steroid injection - Plan: methylPREDNISolone acetate (DEPO-MEDROL) injection 80 mg  Headache, unspecified headache type - muscular; steroids should help. heat, stretches - Plan: methylPREDNISolone acetate (DEPO-MEDROL) injection 80 mg  Sinobronchitis - some improvement w/ABX. Given lack of fever, purulence and diarrhea, avoid add'l ABX at this time - Plan: methylPREDNISolone acetate (DEPO-MEDROL) injection 80 mg  Diarrhea, unspecified type - worsened by ABX. reviewed lactose-free diet; probiotics, imodium prn. f/u if worse    Depo medrol 67m. Risks/side effects of steroids reviewed.   Continue to drink plenty of fluid, and do the sinus rinses. Continue the mucinex, and cough suppressants as needed. We are giving you a steroid injection today, to help with the sinuses, cough and inflammation in your muscles, causing a lot of your pain (headaches, neck pain). This is NOT treating an infection, so if it seems like your sinus infection  isn't improving--ie recurrence or worsening of discolored mucus (either from nose or coughing it up), please call [place] in antibiotic.  I don't want to start a new, strong antibiotic worse as I'm not 100% sure it is needed, and it will worsen your diarrhea.  We can add it early next week if you aren't better.   Continue probiotic. Continue imodium, as needed.  Stop the imodium and call us/see [place] if you develop high fever, abdominal pain, bloody stools. Avoid all dairy for at least 5 days after your diarrhea resolves. (no cheese!)  25 min visit, more than 1/2 spent counseling and answering questions

## 2015-06-27 DIAGNOSIS — D1801 Hemangioma of skin and subcutaneous tissue: Secondary | ICD-10-CM | POA: Diagnosis not present

## 2015-06-27 DIAGNOSIS — L821 Other seborrheic keratosis: Secondary | ICD-10-CM | POA: Diagnosis not present

## 2015-06-27 DIAGNOSIS — L814 Other melanin hyperpigmentation: Secondary | ICD-10-CM | POA: Diagnosis not present

## 2015-07-24 ENCOUNTER — Other Ambulatory Visit: Payer: Self-pay | Admitting: Family Medicine

## 2015-07-24 NOTE — Telephone Encounter (Signed)
Is this okay to refill? 

## 2015-07-31 ENCOUNTER — Encounter: Payer: Self-pay | Admitting: Family Medicine

## 2015-07-31 ENCOUNTER — Ambulatory Visit (INDEPENDENT_AMBULATORY_CARE_PROVIDER_SITE_OTHER): Payer: Medicare Other | Admitting: Family Medicine

## 2015-07-31 VITALS — BP 138/78 | HR 72 | Ht 64.5 in | Wt 179.8 lb

## 2015-07-31 DIAGNOSIS — I10 Essential (primary) hypertension: Secondary | ICD-10-CM | POA: Diagnosis not present

## 2015-07-31 DIAGNOSIS — Z1159 Encounter for screening for other viral diseases: Secondary | ICD-10-CM

## 2015-07-31 DIAGNOSIS — K219 Gastro-esophageal reflux disease without esophagitis: Secondary | ICD-10-CM

## 2015-07-31 DIAGNOSIS — E039 Hypothyroidism, unspecified: Secondary | ICD-10-CM | POA: Diagnosis not present

## 2015-07-31 DIAGNOSIS — L409 Psoriasis, unspecified: Secondary | ICD-10-CM | POA: Diagnosis not present

## 2015-07-31 DIAGNOSIS — Z5181 Encounter for therapeutic drug level monitoring: Secondary | ICD-10-CM | POA: Diagnosis not present

## 2015-07-31 DIAGNOSIS — Z0000 Encounter for general adult medical examination without abnormal findings: Secondary | ICD-10-CM | POA: Diagnosis not present

## 2015-07-31 DIAGNOSIS — E559 Vitamin D deficiency, unspecified: Secondary | ICD-10-CM | POA: Diagnosis not present

## 2015-07-31 DIAGNOSIS — R7301 Impaired fasting glucose: Secondary | ICD-10-CM | POA: Diagnosis not present

## 2015-07-31 DIAGNOSIS — E7800 Pure hypercholesterolemia, unspecified: Secondary | ICD-10-CM | POA: Diagnosis not present

## 2015-07-31 NOTE — Patient Instructions (Addendum)
HEALTH MAINTENANCE RECOMMENDATIONS:  It is recommended that you get at least 30 minutes of aerobic exercise at least 5 days/week (for weight loss, you may need as much as 60-90 minutes). This can be any activity that gets your heart rate up. This can be divided in 10-15 minute intervals if needed, but try and build up your endurance at least once a week.  Weight bearing exercise is also recommended twice weekly.  Eat a healthy diet with lots of vegetables, fruits and fiber.  "Colorful" foods have a lot of vitamins (ie green vegetables, tomatoes, red peppers, etc).  Limit sweet tea, regular sodas and alcoholic beverages, all of which has a lot of calories and sugar.  Up to 1 alcoholic drink daily may be beneficial for women (unless trying to lose weight, watch sugars).  Drink a lot of water.  Calcium recommendations are 1200-1500 mg daily (1500 mg for postmenopausal women or women without ovaries), and vitamin D 1000 IU daily.  This should be obtained from diet and/or supplements (vitamins), and calcium should not be taken all at once, but in divided doses.  Monthly self breast exams and yearly mammograms for women over the age of 100 is recommended.  Sunscreen of at least SPF 30 should be used on all sun-exposed parts of the skin when outside between the hours of 10 am and 4 pm (not just when at beach or pool, but even with exercise, golf, tennis, and yard work!)  Use a sunscreen that says "broad spectrum" so it covers both UVA and UVB rays, and make sure to reapply every 1-2 hours.  Remember to change the batteries in your smoke detectors when changing your clock times in the spring and fall.  Use your seat belt every time you are in a car, and please drive safely and not be distracted with cell phones and texting while driving.   Mckenzie Webb , Thank you for taking time to come for your Medicare Wellness Visit. I appreciate your ongoing commitment to your health goals. Please review the following  plan we discussed and let me know if I can assist you in the future.   These are the goals we discussed: Goals    None      This is a list of the screening recommended for you and due dates:  Health Maintenance  Topic Date Due  . Hemoglobin A1C  22-Feb-1946  .  Hepatitis C: One time screening is recommended by Center for Disease Control  (CDC) for  adults born from [birth year] through 1963.   27-Jun-1946  . Complete foot exam   08/16/1958  . Eye exam for diabetics  08/16/1958  . Urine Protein Check  08/16/1958  . Flu Shot  03/19/2016  . Mammogram  12/26/2016  . Colon Cancer Screening  10/25/2017  . Tetanus Vaccine  12/30/2020  . DEXA scan (bone density measurement)  Completed  . Shingles Vaccine  Completed  . Pneumonia vaccines  Completed   You have not been diagnosed with diabetes, just impaired fasting glucose/pre-diabetes.  Let your eye doctor be aware  The things above that state you are past due are truly for diabetes (protein check, eye exam, foot exam). You have had an A1c, and another is being done today. You are getting the hepatitic C screen today. Your immunizations are up to date. Your next mammogram is actually recommended in 2017, not 2018.  Increase your losartan to the full tablet daily. Continue to monitor your blood pressure and bring  the list to your next appointment.    You may use claritin or zyrtec daily to help with the allergies which is likely contributing to your cough (from postnasal drip). Sometimes robitussin/mucinex also helps with the cough by making the drainage thinner.

## 2015-07-31 NOTE — Progress Notes (Signed)
Chief Complaint  Patient presents with  . Medicare Wellness    fasting without pap-sees Dr.Robert Wein and is UTD. No concerns.    Mckenzie Webb is a 69 y.o. female who presents for annual wellness visit and follow-up on chronic medical conditions.  She has the following concerns:  She continues to cough some, but much better, not often.  Cough is nonproductive.  Only rarely needs to take Delsym.  It took a couple of weeks after her last visit before she started feeling better.  Hypertension:  Diagnosed 2 months ago, and started on losartan at that time. She never increased to the full tablet, stayed on just 67m. She is asking about combo with HCTZ, as this is what her husband takes. She denies any significant edema.  BP's at home range from 122-167/69-91.  Frequently 1283-662systolic, only once below 125.  Denies dizziness.  Occasional headache, no different from what is typical for her.  Hypothyroidism:  Denies any changes in hair, skin, or bowels. No changes in weight, energy, moods. Skin and hair are always dry, unchanged.  Compliant with medications.  Psoriasis--doing well. Refill was recently sent, but not actually filled yet due to change in her insurance and cost will be much less in January.  Hyperlipidemia: She continues to take red yeast rice, and 1 fish oil capsule daily   GERD--She has been on Nexium daily. She tried quitting taking it cold [place] had recurrent symptoms. She tried taking the OTC Nexium, but it wasn't effective.  She needs to have it changed to omeprazole due to insurance change.  Trochanteric bursitis: Currently doing well.  She last had bilateral injections in June.   Diarrhea:  She saw Dr. MCollene Mares  She is now on a different probiotic, and is doing much better overall. She sometimes has a little pain, improved overall. No bloody or black stools.  She had fasting labs done in September (see scanned form), done by NP at work. LDL 131 Fasting glucose 107,  A1c 5.8 Vitamin D-OH 29.5--she admits she hadn't been taking the Vitamin D regularly at that time. She has been taking her Vitamin D every day since then. TSH 1.390   Immunization History  Administered Date(s) Administered  . Influenza Split 06/20/2011, 06/19/2012, 05/25/2014  . Influenza Whole 05/19/2000, 05/19/2001  . Influenza-Unspecified 05/24/2015  . Pneumococcal Conjugate-13 07/26/2013  . Pneumococcal Polysaccharide-23 06/16/2000, 07/27/2014  . Td 04/13/2001  . Tdap 12/31/2010  . Zoster 07/26/2013   Last Pap smear: UTD through GYN  Last mammogram: 12/2014  Last colonoscopy: 3/09  Last DEXA: 12/2014, normal Dentist: every 6 months  Ophtho: yearly (last 1.5 years ago), scheduled for 09/2015 Exercise: walking 15-20 minutes 3x/week  Other doctors caring for patient include: [place] [facility] [place]: Dr. WStann [place]GI: Dr. MCollene MaresDentist: can't recall the name Previously saw urologist at [facility] (for IC), not seen in years   Depression, Fall and Functional Status screens were performed and unremarkable.  Please see questionnaires in epic.  End of Life Discussion:  Patient does not have a living will and medical power of attorney. She was given forms last year but admits she didn't do them yet.  Past Medical History  Diagnosis Date  . Psoriasis   . GERD (gastroesophageal reflux disease)   . Allergy   . Interstitial cystitis   . Leukopenia   . Edema     resolved  . Hyperlipidemia   . Diabetes mellitus 4/07    pt reports that all  subsequent tests were normal, not diabetic  . Obesity   . DJD (degenerative joint disease), cervical 04 2002  . Hypothyroid   . Prediabetes 06/2014    fasting glucose 109; A1c 5.7    Past Surgical History  Procedure Laterality Date  . Cholecystectomy    . Tubal ligation  1983  . Knee arthroscopy Right 1990  . Colonoscopy  3/09  . Varicose vein surgery  09/2009 R, 06/2010 L  . Knee arthroscopy Left 12/12/2010     (Dr.  Noemi Chapel)    Social History   Social History  . Marital Status: Married    Spouse Name: N/A  . Number of Children: 2  . Years of Education: N/A   Occupational History  . office work for [employer]    Social History Main Topics  . Smoking status: Never Smoker   . Smokeless tobacco: Never Used  . Alcohol Use: Yes     Comment: social, 1-2 times per year  . Drug Use: No  . Sexual Activity:    Partners: Male   Other Topics Concern  . Not on file   Social History Narrative   Lives with husband, no pets.  Children in [place], [place] and [place].  1 granddaughter. Retired 05/2014.    Family History  Problem Relation Age of Onset  . Hypertension Mother   . Heart disease Mother   . Gallbladder disease Mother   . Thyroid disease Mother   . Arthritis Mother   . Diabetes Father   . Cancer Father     pancreatic  . Hypertension Sister   . Gallbladder disease Sister   . Diabetes Sister   . Diverticulitis Sister     of small intestine  . Cancer Son     testicular cancer  . Stroke Sister     late 78's  . Hypertension Sister   . Pulmonary embolism Sister   . Ulcerative colitis Sister   . Breast cancer Neg Hx   . Colon cancer Neg Hx     Outpatient Encounter Prescriptions as of 07/31/2015  Medication Sig Note  . Acetaminophen (TYLENOL ARTHRITIS PAIN PO) Take 2 tablets by mouth as needed.   Marland Kitchen acetaminophen (TYLENOL) 500 MG tablet Take 1,000 mg by mouth every 6 (six) hours as needed.   . Calcium-Vitamin D (CALTRATE 600 PLUS-VIT D PO) Take 1 tablet by mouth daily.   07/27/2014: Takes daily  . Cholecalciferol (VITAMIN D) 1000 UNITS capsule Take 1,000 Units by mouth daily.     Marland Kitchen co-enzyme Q-10 30 MG capsule Take 100 mg by mouth daily.     . Cyanocobalamin (VITAMIN B 12 PO) Take 1 tablet by mouth.     . fish oil-omega-3 fatty acids 1000 MG capsule Take 1.2 g by mouth daily.   Marland Kitchen levothyroxine (SYNTHROID, LEVOTHROID) 88 MCG tablet Take 1 tablet (88 mcg total) by mouth daily.    Marland Kitchen losartan (COZAAR) 50 MG tablet Start at 1/2 tablet by mouth once daily. Increase after 2 weeks as directed, if BP remains above goal 07/31/2015: Takes 1/2 tablet daily  . Multiple Vitamins-Minerals (ALIVE WOMENS 50+ PO) Take by mouth.   Marland Kitchen NEXIUM 40 MG capsule Take 1 capsule (40 mg total) by mouth daily.   . Red Yeast Rice Extract (RED YEAST RICE PO) Take 4 tablets by mouth daily.     . clobetasol cream (TEMOVATE) 0.05 % APPLY TOPICALLY TWO TIMES DAILY AS NEEDED FOR PSORIASIS. USE SPARINGLY. (Patient not taking: Reported on 07/31/2015)  No facility-administered encounter medications on file as of 07/31/2015.    Allergies  Allergen Reactions  . Ciprofloxacin     After she takes for a while it gives her a "funny feeling."  . Codeine Nausea Only  . Iodine Swelling  . Polysporin [Bacitracin-Polymyxin B] Swelling    Used for eye infection--got redder and worse  . Sulfa Antibiotics Swelling    Lips swell, itching  . Penicillins Rash    ROS: The patient denies anorexia, fever, vision changes, decreased hearing, ear pain, sore throat, breast concerns, chest pain, palpitations, dizziness, syncope, dyspnea on exertion, cough, swelling, nausea, vomiting, melena, hematochezia, hematuria, incontinence, dysuria, vaginal bleeding, discharge, odor or itch, genital lesions, numbness, tingling, weakness, tremor, suspicious skin lesions, depression, anxiety, abnormal bleeding/bruising, or enlarged lymph nodes. No memory concerns. Has intermittent insomnia, relieved by Tylenol PM as needed. hemorroids periodically flare, not currently. Low back pain, mild and constant for a while (unchanged from last year). No radiation, numbness, tingling, weakness She has lost 7.5# in the last 6 months, intentional. Intermittent diarrhea, overall improved with probiotics. Residual, intermittent dry cough as per HPI.  PHYSICAL EXAM:  BP 140/80 mmHg  Pulse 72  Ht 5' 4.5" (1.638 m)  Wt 179 lb 12.8 oz (81.557 kg)   BMI 30.40 kg/m2  138/78 on repeat by MD  General Appearance:  Alert, cooperative, no distress, appears stated age   Head:  Normocephalic, without obvious abnormality, atraumatic   Eyes:  PERRL, conjunctiva/corneas clear, EOM's intact, fundi  benign   Ears:  Normal TM's and external ear canals   Nose:  Nares normal, mucosa mildly edematous with white mucus noted on the right; no sinus tenderness   Throat:  Lips, mucosa, and tongue normal; teeth and gums normal   Neck:  Supple, no lymphadenopathy; thyroid: no enlargement/tenderness/nodules; no carotid  bruit or JVD   Back:  Spine nontender, no curvature, ROM normal, no CVA tenderness.   Lungs:  Clear to auscultation bilaterally without wheezes, rales or ronchi; respirations unlabored   Chest Wall:  No tenderness or deformity   Heart:  Regular rate and rhythm, S1 and S2 normal, no murmur, rub  or gallop   Breast Exam:  Deferred to GYN   Abdomen:  Soft, nondistended, nontenderr, normoactive bowel sounds,  no masses, no hepatosplenomegaly. No rebound or guarding.  Genitalia:  Deferred to GYN      Extremities:  No clubbing, cyanosis or edema.  Pulses:  2+ and symmetric all extremities   Skin:  Skin color, texture, turgor normal, no rashes or lesions   Lymph nodes:  Cervical, supraclavicular, and axillary nodes normal   Neurologic:  CNII-XII intact, normal strength, sensation and gait; reflexes 2+ and symmetric throughout    Psych: Normal mood, affect, hygiene and grooming        ASSESSMENT/PLAN:  Medicare annual wellness visit, initial  Gastroesophageal reflux disease, esophagitis presence not specified  Hypothyroidism, unspecified hypothyroidism type - normal TSH with recent labs. Continue current dose  Vitamin D deficiency - only borderline low on last check, when had missed doses of supplement. Continue daily  supplementation  IFG (impaired fasting glucose) - due for fasting labs. Discussed proper diet - Plan: Comprehensive metabolic panel, Hemoglobin A1c  Psoriasis - controlled, not flaring.   Pure hypercholesterolemia - on OTC red yeast rice and fish oil, due for recheck - Plan: Comprehensive metabolic panel, Lipid panel  Need for hepatitis C screening test - Plan: Hepatitis C antibody  Medication monitoring encounter - Plan: Comprehensive  metabolic panel  Essential hypertension, benign - suboptimally controlled; increase losartan to full tablet and continue to monitor. f/u 1 month - Plan: Comprehensive metabolic panel   HTN--has refills left. May need to change to another pharmacy due to cost. Increase to full tablet and continue to monitor BP. F/u 1 month on BP  c-met, A1c, lipid, Hep C today  She will call us in January for the prescriptions for omeprazole and Synthroid, doesn't want them today.   Discussed monthly self breast exams and yearly mammograms; at least 30 minutes of aerobic activity at least 5 days/week and weight-bearing exercise 2x/week; proper sunscreen use reviewed; healthy diet, including goals of calcium and vitamin D intake and alcohol recommendations (less than or equal to 1 drink/day) reviewed; regular seatbelt use; changing batteries in smoke detectors. Immunization recommendations discussed--UTD; continue high dose flu shots yearly. Colonoscopy recommendations reviewed, UTD  MOST form reviewed and updated. Full Code, Full care. Given new forms for Living Will and healthcare POA.  Encouraged to fill out and send [place] copy.    Medicare Attestation I have personally reviewed: The patient's medical and social history Their use of alcohol, tobacco or illicit drugs Their current medications and supplements The patient's functional ability including ADLs,fall risks, home safety risks, cognitive, and hearing and visual impairment Diet and physical  activities Evidence for depression or mood disorders  The patient's weight, height, and BMI have been recorded in the chart.  I have made referrals, counseling, and provided education to the patient based on review of the above and I have provided the patient with a written personalized care plan for preventive services.     Jhoana Upham A, MD   07/31/2015

## 2015-08-01 ENCOUNTER — Encounter: Payer: Self-pay | Admitting: Family Medicine

## 2015-08-01 LAB — COMPREHENSIVE METABOLIC PANEL WITH GFR
ALT: 21 U/L (ref 6–29)
AST: 21 U/L (ref 10–35)
Albumin: 4 g/dL (ref 3.6–5.1)
Alkaline Phosphatase: 63 U/L (ref 33–130)
BUN: 10 mg/dL (ref 7–25)
CO2: 29 mmol/L (ref 20–31)
Calcium: 9.7 mg/dL (ref 8.6–10.4)
Chloride: 103 mmol/L (ref 98–110)
Creat: 0.72 mg/dL (ref 0.50–0.99)
Glucose, Bld: 91 mg/dL (ref 65–99)
Potassium: 4.2 mmol/L (ref 3.5–5.3)
Sodium: 141 mmol/L (ref 135–146)
Total Bilirubin: 0.6 mg/dL (ref 0.2–1.2)
Total Protein: 7.4 g/dL (ref 6.1–8.1)

## 2015-08-01 LAB — HEPATITIS C ANTIBODY: HCV AB: NEGATIVE

## 2015-08-01 LAB — HEMOGLOBIN A1C
Hgb A1c MFr Bld: 5.9 % — ABNORMAL HIGH (ref <5.7)
Mean Plasma Glucose: 123 mg/dL — ABNORMAL HIGH (ref <117)

## 2015-08-01 LAB — LIPID PANEL
Cholesterol: 233 mg/dL — ABNORMAL HIGH (ref 125–200)
HDL: 74 mg/dL
LDL Cholesterol: 126 mg/dL
Total CHOL/HDL Ratio: 3.1 ratio
Triglycerides: 164 mg/dL — ABNORMAL HIGH
VLDL: 33 mg/dL — ABNORMAL HIGH

## 2015-08-16 ENCOUNTER — Telehealth: Payer: Self-pay | Admitting: Family Medicine

## 2015-08-16 ENCOUNTER — Other Ambulatory Visit: Payer: Self-pay

## 2015-08-16 DIAGNOSIS — E039 Hypothyroidism, unspecified: Secondary | ICD-10-CM

## 2015-08-16 MED ORDER — LEVOTHYROXINE SODIUM 88 MCG PO TABS: 88.0000 ug | ORAL_TABLET | Freq: Every day | ORAL | Status: DC

## 2015-08-16 MED ORDER — OMEPRAZOLE 40 MG PO CPDR: 40.0000 mg | DELAYED_RELEASE_CAPSULE | Freq: Every day | ORAL | Status: DC

## 2015-08-16 NOTE — Telephone Encounter (Signed)
done

## 2015-08-16 NOTE — Telephone Encounter (Signed)
Pt called for refills of generic nexium and levothyroxine. Please send to [facility]. Pt had well visit 07/31/2015. Pt can be reached at [phone].

## 2015-09-21 ENCOUNTER — Encounter: Payer: Self-pay | Admitting: Family Medicine

## 2015-09-21 ENCOUNTER — Ambulatory Visit (INDEPENDENT_AMBULATORY_CARE_PROVIDER_SITE_OTHER): Payer: Medicare Other | Admitting: Family Medicine

## 2015-09-21 VITALS — BP 142/78 | HR 64 | Ht 64.5 in | Wt 181.4 lb

## 2015-09-21 DIAGNOSIS — I10 Essential (primary) hypertension: Secondary | ICD-10-CM | POA: Diagnosis not present

## 2015-09-21 DIAGNOSIS — L409 Psoriasis, unspecified: Secondary | ICD-10-CM

## 2015-09-21 MED ORDER — TRIAMCINOLONE ACETONIDE 0.1 % EX CREA: 1.0000 "application " | TOPICAL_CREAM | Freq: Two times a day (BID) | CUTANEOUS | Status: DC

## 2015-09-21 MED ORDER — LOSARTAN POTASSIUM-HCTZ 50-12.5 MG PO TABS: 1.0000 | ORAL_TABLET | Freq: Every day | ORAL | Status: DC

## 2015-09-21 NOTE — Progress Notes (Signed)
Chief Complaint  Patient presents with  . Hypertension    blood pressure follow up. Is up to the one tablet of losartan still feels it is not working. Feels like bp med is making her feel "foggy."  Also clobetasol is super expensive, is there anything else cheaper she could use. ($200+ for tube)    Patient presents to follow up on her blood pressure. She increased losartan to 38m (taking full tablet) about 6-7 weeks ago.  She denies dizziness, but sometimes she doesn't feel like she is thinking as clearly, "a funny feeling", somewhat groggy.  She takes the pill in the morning.  Blood pressures fluctuate 128-174/66-88, mostly 135-150/80.   Psoriasis--clobetasol is too expensive.  Psoriasis isn't flaring too badly now (has some across her hips/buttock) ,asking for less expensive cream.  Previously saw dermatologist (not recently).  PMH, PSH, SH reviewed  Outpatient Encounter Prescriptions as of 09/21/2015  Medication Sig Note  . Acetaminophen (TYLENOL ARTHRITIS PAIN PO) Take 2 tablets by mouth as needed. Reported on 09/21/2015   . Calcium-Vitamin D (CALTRATE 600 PLUS-VIT D PO) Take 1 tablet by mouth daily.   07/27/2014: Takes daily  . Cholecalciferol (VITAMIN D) 1000 UNITS capsule Take 1,000 Units by mouth daily.     .Marland Kitchenco-enzyme Q-10 30 MG capsule Take 100 mg by mouth daily.     . Cyanocobalamin (VITAMIN B 12 PO) Take 1 tablet by mouth.     . fish oil-omega-3 fatty acids 1000 MG capsule Take 1.2 g by mouth daily.   .Marland Kitchenlevothyroxine (SYNTHROID, LEVOTHROID) 88 MCG tablet Take 1 tablet (88 mcg total) by mouth daily.   . Multiple Vitamins-Minerals (ALIVE WOMENS 50+ PO) Take by mouth.   .Marland Kitchenomeprazole (PRILOSEC) 40 MG capsule Take 1 capsule (40 mg total) by mouth daily.   . Red Yeast Rice Extract (RED YEAST RICE PO) Take 4 tablets by mouth daily.     . [DISCONTINUED] losartan (COZAAR) 50 MG tablet Start at 1/2 tablet by mouth once daily. Increase after 2 weeks as directed, if BP remains above goal  09/21/2015: One tablet  . acetaminophen (TYLENOL) 500 MG tablet Take 1,000 mg by mouth every 6 (six) hours as needed. Reported on 09/21/2015   . losartan-hydrochlorothiazide (HYZAAR) 50-12.5 MG tablet Take 1 tablet by mouth daily.   .Marland Kitchentriamcinolone cream (KENALOG) 0.1 % Apply 1 application topically 2 (two) times daily.   . [DISCONTINUED] clobetasol cream (TEMOVATE) 0.05 % APPLY TOPICALLY TWO TIMES DAILY AS NEEDED FOR PSORIASIS. USE SPARINGLY. (Patient not taking: Reported on 07/31/2015)   . [DISCONTINUED] NEXIUM 40 MG capsule Take 1 capsule (40 mg total) by mouth daily.    No facility-administered encounter medications on file as of 09/21/2015.   Taking losartan 552mprior to today's visit, not losartan HCT  Allergies  Allergen Reactions  . Ciprofloxacin     After she takes for a while it gives her a "funny feeling."  . Codeine Nausea Only  . Iodine Swelling  . Polysporin [Bacitracin-Polymyxin B] Swelling    Used for eye infection--got redder and worse  . Sulfa Antibiotics Swelling    Lips swell, itching  . Penicillins Rash   ROS: no fever, chills, headaches, dizziness, chest pain, palpitations, shortness of breath, GI or GU complaints.  +fogginess/fatigue as per HPI. Denies allergies, sleeping well. No muscle cramps, edema, joint pains, depression  PHYSICAL EXAM: BP 148/78 mmHg  Pulse 64  Ht 5' 4.5" (1.638 m)  Wt 181 lb 6.4 oz (82.283 kg)  BMI  30.67 kg/m2  142/78 on repeat by MD Well appearing, pleasant female in no distress HEENT: PERRL, EOMI, conjunctiva clear, OP clear Neck: no lymphadenopathy, thyromegaly or mass, no bruit Heart: regular rate and rhythm Lungs: clear bilaterally Extremities: no edema Skin: Right upper hip/buttock--erythematous/pink slightly raised areas that have coalesced. No thick scales Psych: normal mood, affect, hygiene and grooming Neuro: alert and oriented. Cranial nerves intact, normal strength, gait  ASSESSMENT/PLAN:   Essential hypertension,  benign - borderline/suboptimally controlled.  Add HCTZ to losartan. cont monitoring. risks/side effects reviewed. - Plan: losartan-hydrochlorothiazide (HYZAAR) 50-12.5 MG tablet  Psoriasis - Plan: triamcinolone cream (KENALOG) 0.1 %    Change from losartan 50 to losartan HCT 50/12.5.   Check BP less often--just 1-2 times/week, more often if feeling dizzy, headache, bad. Bring your list to your next appointment. Eat 1/2 banana daily (especially if you develop any muscle cramps). We will check your bloodwork next visit.  TAC 0.1% trial. Consider Lidex if not strong enough. If worsening psoriasis, f/u with dermatologist.

## 2015-09-21 NOTE — Patient Instructions (Addendum)
Change from losartan 50 to losartan HCT 50/12.5.  Check BP less often--just 1-2 times/week, more often if feeling dizzy, headache, bad. Bring your list to your next appointment. Eat 1/2 banana daily (especially if you develop any muscle cramps). We will check your bloodwork next visit.  We will change your psoriasis to a less expensive, somewhat weaker medication (since your flare isn't too bad, this will likely work fine) If worsening psoriasis, f/u with dermatologist.

## 2015-10-02 NOTE — Therapy (Signed)
[place], [place], [address] Phone: [phone]   Fax:  [phone]  Patient Details  Name: Mckenzie Webb MRN: [id number] Date of Birth: [birth year] Referring Provider:  Rita Ohara, MD  Encounter Date: 10/02/2015  PHYSICAL THERAPY DISCHARGE SUMMARY  Visits from Start of Care: 9  Current functional level related to goals / functional outcomes: Has not returned since last visit    Remaining deficits: Unable to assess    Education / Equipment: N/A  Plan: Patient agrees to discharge.  Patient goals were partially met. Patient is being discharged due to not returning since the last visit.  ?????       Deniece Ree PT, DPT [facility] [address] [place], [place], [address] Phone: [phone]   Fax:  [phone]

## 2015-11-01 ENCOUNTER — Encounter: Payer: Self-pay | Admitting: Family Medicine

## 2015-11-01 ENCOUNTER — Ambulatory Visit (INDEPENDENT_AMBULATORY_CARE_PROVIDER_SITE_OTHER): Payer: Medicare Other | Admitting: Family Medicine

## 2015-11-01 VITALS — BP 118/72 | HR 68 | Ht 64.5 in | Wt 182.4 lb

## 2015-11-01 DIAGNOSIS — Z5181 Encounter for therapeutic drug level monitoring: Secondary | ICD-10-CM | POA: Diagnosis not present

## 2015-11-01 DIAGNOSIS — I10 Essential (primary) hypertension: Secondary | ICD-10-CM

## 2015-11-01 MED ORDER — LOSARTAN POTASSIUM-HCTZ 50-12.5 MG PO TABS: 1.0000 | ORAL_TABLET | Freq: Every day | ORAL | Status: DC

## 2015-11-01 NOTE — Patient Instructions (Signed)
Your blood pressure is much better. Continue the current medication. See you in December!

## 2015-11-01 NOTE — Progress Notes (Signed)
Chief Complaint  Patient presents with  . Hypertension    6 week follow up on bp.    Patient presents to follow up on hypertension.  Losartan was changed to Losartan HCT 50/12.5 at her visit last month.  Eating bananas daily; denies muscle cramps.  Denies any side effects, no significant increase in urination. BP at home ranging from 125-146/69-84, most often in low 130's/78 range.    PMH, PSH, SH reviewed  Outpatient Encounter Prescriptions as of 11/01/2015  Medication Sig Note  . Calcium-Vitamin D (CALTRATE 600 PLUS-VIT D PO) Take 1 tablet by mouth daily.   07/27/2014: Takes daily  . Cholecalciferol (VITAMIN D) 1000 UNITS capsule Take 1,000 Units by mouth daily.     Marland Kitchen co-enzyme Q-10 30 MG capsule Take 100 mg by mouth daily.     . Cyanocobalamin (VITAMIN B 12 PO) Take 1 tablet by mouth.     . fish oil-omega-3 fatty acids 1000 MG capsule Take 2 g by mouth daily.    Marland Kitchen levothyroxine (SYNTHROID, LEVOTHROID) 88 MCG tablet Take 1 tablet (88 mcg total) by mouth daily.   Marland Kitchen losartan-hydrochlorothiazide (HYZAAR) 50-12.5 MG tablet Take 1 tablet by mouth daily.   . Multiple Vitamins-Minerals (ALIVE WOMENS 50+ PO) Take by mouth.   Marland Kitchen omeprazole (PRILOSEC) 40 MG capsule Take 1 capsule (40 mg total) by mouth daily.   . Red Yeast Rice Extract (RED YEAST RICE PO) Take 4 tablets by mouth daily.     Marland Kitchen triamcinolone cream (KENALOG) 0.1 % Apply 1 application topically 2 (two) times daily.   . Acetaminophen (TYLENOL ARTHRITIS PAIN PO) Take 2 tablets by mouth as needed. Reported on 11/01/2015   . acetaminophen (TYLENOL) 500 MG tablet Take 1,000 mg by mouth every 6 (six) hours as needed. Reported on 11/01/2015   . [DISCONTINUED] losartan (COZAAR) 50 MG tablet  11/01/2015: Received from: External Pharmacy   No facility-administered encounter medications on file as of 11/01/2015.   Allergies  Allergen Reactions  . Ciprofloxacin     After she takes for a while it gives her a "funny feeling."  . Codeine Nausea Only   . Iodine Swelling  . Polysporin [Bacitracin-Polymyxin B] Swelling    Used for eye infection--got redder and worse  . Sulfa Antibiotics Swelling    Lips swell, itching  . Penicillins Rash   ROS: no fever, chills, headaches, URI symptoms, cough, shortness of breath, urinary frequency or other urinary complaints, chest pain, shortness of breath, edema, muscle cramps, bleeding, bruising, rash or other concerns.  PHYSICAL EXAM: BP 118/72 mmHg  Pulse 68  Ht 5' 4.5" (1.638 m)  Wt 182 lb 6.4 oz (82.736 kg)  BMI 30.84 kg/m2 122/70 on repeat by MD Well developed, pleasant female in good spirits, in no distress HEENT: PERRL, EOMI, conjunctiva and sclera are clear. OP with  Moist mucus membranes Neck: no lymphadenopathy, thyromegaly or mass Heart: regular rate and rhythm, no murmur Lungs: clear bilaterally Extremities: no edema Psych: normal mood, affect, hygiene and grooming  ASSESSMENT/PLAN:  Essential hypertension, benign - improved control since adding HCTZ; continue - Plan: Basic metabolic panel, losartan-hydrochlorothiazide (HYZAAR) 50-12.5 MG tablet  Medication monitoring encounter    F/u in December as scheduled, sooner prn

## 2015-11-02 LAB — BASIC METABOLIC PANEL
BUN: 14 mg/dL (ref 7–25)
CHLORIDE: 105 mmol/L (ref 98–110)
CO2: 28 mmol/L (ref 20–31)
CREATININE: 0.76 mg/dL (ref 0.50–0.99)
Calcium: 9.2 mg/dL (ref 8.6–10.4)
Glucose, Bld: 102 mg/dL — ABNORMAL HIGH (ref 65–99)
Potassium: 4.3 mmol/L (ref 3.5–5.3)
SODIUM: 143 mmol/L (ref 135–146)

## 2015-11-16 DIAGNOSIS — S83281A Other tear of lateral meniscus, current injury, right knee, initial encounter: Secondary | ICD-10-CM | POA: Diagnosis not present

## 2015-11-16 DIAGNOSIS — M76891 Other specified enthesopathies of right lower limb, excluding foot: Secondary | ICD-10-CM | POA: Diagnosis not present

## 2015-12-19 ENCOUNTER — Other Ambulatory Visit: Payer: Self-pay

## 2015-12-19 DIAGNOSIS — Z1231 Encounter for screening mammogram for malignant neoplasm of breast: Secondary | ICD-10-CM

## 2015-12-28 DIAGNOSIS — S83281D Other tear of lateral meniscus, current injury, right knee, subsequent encounter: Secondary | ICD-10-CM | POA: Diagnosis not present

## 2016-01-05 DIAGNOSIS — M25561 Pain in right knee: Secondary | ICD-10-CM | POA: Diagnosis not present

## 2016-01-05 DIAGNOSIS — M25551 Pain in right hip: Secondary | ICD-10-CM | POA: Diagnosis not present

## 2016-01-11 DIAGNOSIS — M170 Bilateral primary osteoarthritis of knee: Secondary | ICD-10-CM | POA: Diagnosis not present

## 2016-01-17 DIAGNOSIS — M170 Bilateral primary osteoarthritis of knee: Secondary | ICD-10-CM | POA: Diagnosis not present

## 2016-01-24 DIAGNOSIS — M170 Bilateral primary osteoarthritis of knee: Secondary | ICD-10-CM | POA: Diagnosis not present

## 2016-01-25 ENCOUNTER — Ambulatory Visit
Admission: RE | Admit: 2016-01-25 | Discharge: 2016-01-25 | Disposition: A | Discharge status: Home / Self Care | Payer: Medicare Other | Source: Ambulatory Visit

## 2016-01-25 ENCOUNTER — Other Ambulatory Visit: Payer: Self-pay | Admitting: Obstetrics & Gynecology

## 2016-01-25 DIAGNOSIS — Z1231 Encounter for screening mammogram for malignant neoplasm of breast: Secondary | ICD-10-CM | POA: Diagnosis not present

## 2016-02-21 ENCOUNTER — Ambulatory Visit (INDEPENDENT_AMBULATORY_CARE_PROVIDER_SITE_OTHER): Payer: Medicare Other | Admitting: Family Medicine

## 2016-02-21 ENCOUNTER — Encounter: Payer: Self-pay | Admitting: Family Medicine

## 2016-02-21 VITALS — BP 160/80 | HR 64 | Ht 64.5 in | Wt 183.8 lb

## 2016-02-21 DIAGNOSIS — M7061 Trochanteric bursitis, right hip: Secondary | ICD-10-CM

## 2016-02-21 DIAGNOSIS — M7062 Trochanteric bursitis, left hip: Secondary | ICD-10-CM | POA: Diagnosis not present

## 2016-02-21 DIAGNOSIS — M7630 Iliotibial band syndrome, unspecified leg: Secondary | ICD-10-CM | POA: Diagnosis not present

## 2016-02-21 MED ORDER — LIDOCAINE HCL 2 % IJ SOLN
1.5000 mL | Freq: Once | INTRAMUSCULAR | Status: AC
  Administered 2016-02-21: 30 mg

## 2016-02-21 MED ORDER — TRIAMCINOLONE ACETONIDE 40 MG/ML IJ SUSP
20.0000 mg | Freq: Once | INTRAMUSCULAR | Status: AC
  Administered 2016-02-21: 20 mg

## 2016-02-21 MED ORDER — LIDOCAINE HCL 2 % IJ SOLN
1.5000 mL | Freq: Once | INTRAMUSCULAR | Status: AC
Start: 2016-02-21 — End: 2016-02-21
  Administered 2016-02-21: 30 mg via INTRADERMAL

## 2016-02-21 NOTE — Progress Notes (Signed)
Chief Complaint  Patient presents with  . Hip Pain    B/L hip pain and would like to have injection if possible.     She is complaining of bilateral pain in the legs, similar to bursitis in the past.  She has pain at the lateral hips and lateral thighs (mid-thigh area).  Denies significant back pain, numbness, tingling, weakness.  Denies knee pain. She has some mild chronic low back pain, unchanged.    She previously has gotten injections for bursitis with good results.  Last was about a year ago (bilaterally 01/2015).  She has been taking 1 Aleve twice daily for the last 3-4 weeks. It seemed to help a little at first, but hasn't gotten any additional improvement.  No GI side effects, no bleeding, bruising.  She reports her BP's at home are 130's/70's Current pain level is 7-8/10.  PMH, PSH, SH reviewed  Outpatient Encounter Prescriptions as of 02/21/2016  Medication Sig Note  . Calcium-Vitamin D (CALTRATE 600 PLUS-VIT D PO) Take 1 tablet by mouth daily.   07/27/2014: Takes daily  . Cholecalciferol (VITAMIN D) 1000 UNITS capsule Take 1,000 Units by mouth daily.     Marland Kitchen co-enzyme Q-10 30 MG capsule Take 100 mg by mouth daily.     . Cyanocobalamin (VITAMIN B 12 PO) Take 1 tablet by mouth.     . fish oil-omega-3 fatty acids 1000 MG capsule Take 2 g by mouth daily.    Marland Kitchen levothyroxine (SYNTHROID, LEVOTHROID) 88 MCG tablet Take 1 tablet (88 mcg total) by mouth daily.   Marland Kitchen losartan-hydrochlorothiazide (HYZAAR) 50-12.5 MG tablet Take 1 tablet by mouth daily.   . Multiple Vitamins-Minerals (ALIVE WOMENS 50+ PO) Take by mouth.   . Naproxen Sodium (ALEVE PO) Take 1 tablet by mouth 2 (two) times daily.   Marland Kitchen omeprazole (PRILOSEC) 40 MG capsule Take 1 capsule (40 mg total) by mouth daily.   . Red Yeast Rice Extract (RED YEAST RICE PO) Take 4 tablets by mouth daily.     Marland Kitchen dicyclomine (BENTYL) 10 MG capsule Reported on 02/21/2016 02/21/2016: Received from: External Pharmacy  . triamcinolone cream (KENALOG) 0.1 %  Apply 1 application topically 2 (two) times daily. (Patient not taking: Reported on 02/21/2016)   . [DISCONTINUED] Acetaminophen (TYLENOL ARTHRITIS PAIN PO) Take 2 tablets by mouth as needed. Reported on 11/01/2015   . [DISCONTINUED] acetaminophen (TYLENOL) 500 MG tablet Take 1,000 mg by mouth every 6 (six) hours as needed. Reported on 11/01/2015   . [DISCONTINUED] meloxicam (MOBIC) 15 MG tablet  02/21/2016: Received from: External Pharmacy  . [EXPIRED] lidocaine (XYLOCAINE) 2 % (with pres) injection 30 mg    . [EXPIRED] lidocaine (XYLOCAINE) 2 % (with pres) injection 30 mg    . [EXPIRED] triamcinolone acetonide (KENALOG-40) injection 20 mg    . [EXPIRED] triamcinolone acetonide (KENALOG-40) injection 20 mg     No facility-administered encounter medications on file as of 02/21/2016.   Allergies  Allergen Reactions  . Ciprofloxacin     After she takes for a while it gives her a "funny feeling."  . Codeine Nausea Only  . Iodine Swelling  . Polysporin [Bacitracin-Polymyxin B] Swelling    Used for eye infection--got redder and worse  . Sulfa Antibiotics Swelling    Lips swell, itching  . Penicillins Rash   ROS: no fever, chills, edema, numbness, tingling, back pain, urinary complaints, weakness, URI symptoms, chest pain, shortness of breath, GI complaints. See HPI.  PHYSICAL EXAM: BP 160/80 mmHg  Pulse 64  Ht 5' 4.5" (1.638 m)  Wt 183 lb 12.8 oz (83.371 kg)  BMI 31.07 kg/m2 Well developed, pleasant female, in mild discomfort laying on either ide Tender over bilateral trochanteric bursa, as well as IT bands.  Verbal consent for procedure was obtained. She understands the risks and alternatives, and wishes to proceed with injections.  Procedure: Area was cleansed with alcohol. Ethyl chloride spray was applied, then trochanteric bursa was injected with 9m of kenalog, and 1.5 cc of 2% lidocaine without epi.  The same procedure was done bilaterally. She tolerated both without any  complication, and had some immediate improvement in pain.   ASSESSMENT/PLAN:  Trochanteric bursitis of both hips - Plan: triamcinolone acetonide (KENALOG-40) injection 20 mg, lidocaine (XYLOCAINE) 2 % (with pres) injection 30 mg, triamcinolone acetonide (KENALOG-40) injection 20 mg, lidocaine (XYLOCAINE) 2 % (with pres) injection 30 mg, PR DRAIN/INJECT LARGE JOINT/BURSA  IT band syndrome, unspecified laterality - shown stretches; consider PT if pain persists    Shown stretches for IT bands. Bilateral injections given for trochanteric bursitis.  Refer to PT if pain doesn't resolve with these measures.  Pt can call for referral if/when needed.

## 2016-02-21 NOTE — Patient Instructions (Signed)
Do the stretches as shown for the IT band.  Call us in 1-2 weeks (or sooner) if you would like referral for Physical Therapy  Hip Bursitis Bursitis is a swelling and soreness (inflammation) of a fluid-filled sac (bursa). This sac overlies and protects the joints.  CAUSES   Injury.  Overuse of the muscles surrounding the joint.  Arthritis.  Gout.  Infection.  Cold weather.  Inadequate warm-up and conditioning prior to activities. The cause may not be known.  SYMPTOMS   Mild to severe irritation.  Tenderness and swelling over the outside of the hip.  Pain with motion of the hip.  If the bursa becomes infected, a fever may be present. Redness, tenderness, and warmth will develop over the hip. Symptoms usually lessen in 3 to 4 weeks with treatment, but can come back. TREATMENT If conservative treatment does not work, your caregiver may advise draining the bursa and injecting cortisone into the area. This may speed up the healing process. This may also be used as an initial treatment of choice. HOME CARE INSTRUCTIONS   Apply ice to the affected area for 15-20 minutes every 3 to 4 hours while awake for the first 2 days. Put the ice in a plastic bag and place a towel between the bag of ice and your skin.  Rest the painful joint as much as possible, but continue to put the joint through a normal range of motion at least 4 times per day. When the pain lessens, begin normal, slow movements and usual activities to help prevent stiffness of the hip.  Only take over-the-counter or prescription medicines for pain, discomfort, or fever as directed by your caregiver.  Use crutches to limit weight bearing on the hip joint, if advised.  Elevate your painful hip to reduce swelling. Use pillows for propping and cushioning your legs and hips.  Gentle massage may provide comfort and decrease swelling. SEEK IMMEDIATE MEDICAL CARE IF:   Your pain increases even during treatment, or you are  not improving.  You have a fever.  You have heat and inflammation over the involved bursa.  You have any other questions or concerns. MAKE SURE YOU:   Understand these instructions.  Will watch your condition.  Will get help right away if you are not doing well or get worse.   This information is not intended to replace advice given to you by your health care provider. Make sure you discuss any questions you have with your health care provider.   Document Released: 01/25/2002 Document Revised: 10/28/2011 Document Reviewed: 03/07/2015 Elsevier Interactive Patient Education 2016 [employer]   Iliotibial Band Syndrome Iliotibial band syndrome is pain in the outer, lower thigh. The pain is caused by an inflammation of the iliotibial band. This is a band of thick fibrous tissue that runs down the outside of the thigh. The iliotibial band begins at the hip. It extends to the outer side of the shin bone (tibia) just below the knee joint. The band works with the thigh muscles. Together they provide stability to the outside of the knee joint. Iliotibial band syndrome occurs when there is inflammation to this band of tissue. This is typically due to over use and not due to an injury. The irritation usually occurs over the outside of the knee joint, at the the end of the thigh bone (femur). The iliotibial band crosses bone and muscle at this point. Between these structures is a cushioning sac (bursa). The bursa should make possible a smooth  gliding motion. However, when inflamed, the iliotibial band does not glide easily. When inflamed, there is pain with motion of the knee. Usually the pain worsens with continued movement and the pain goes away with rest. This problem usually arises when there is a sudden increase in sports activities involving your legs. Running, and playing soccer or basketball are examples of activities causing this. Others who are prone to iliotibial band syndrome include  individuals with mechanical problems such as leg length differences, abnormality of walking, bowed legs etc. HOME CARE INSTRUCTIONS   Apply ice to the injured area:  Put ice in a plastic bag.  Place a towel between your skin and the bag.  Leave the ice on for 20 minutes, 2-3 times a day.  Limit excessive training or eliminate training until pain goes away.  While pain is present, you may use gentle range of motion. Do not resume regular use until instructed by your health care provider. Begin use gradually. Do not increase activity to the point of pain. If pain does develop, decrease activity and continue the above measures. Gradually increase activities that do not cause discomfort. Do this until you finally achieve normal use.  Perform low-impact activities while pain is present. Wear proper footwear.  Only take over-the-counter or prescription medicines for pain, discomfort, or fever as directed by your health care provider. SEEK MEDICAL CARE IF:   Your pain increases or pain is not controlled with medications.  You develop new, unexplained symptoms, or an increase of the symptoms that brought you to your health care provider.  Your pain and symptoms are not improving or are getting worse.   This information is not intended to replace advice given to you by your health care provider. Make sure you discuss any questions you have with your health care provider.   Document Released: 01/25/2002 Document Revised: 08/26/2014 Document Reviewed: 03/04/2013 Elsevier Interactive Patient Education [employer].

## 2016-03-21 ENCOUNTER — Ambulatory Visit (INDEPENDENT_AMBULATORY_CARE_PROVIDER_SITE_OTHER): Payer: Medicare Other | Admitting: Family Medicine

## 2016-03-21 ENCOUNTER — Encounter: Payer: Self-pay | Admitting: Family Medicine

## 2016-03-21 VITALS — BP 122/66 | HR 68 | Ht 64.5 in | Wt 175.8 lb

## 2016-03-21 DIAGNOSIS — R197 Diarrhea, unspecified: Secondary | ICD-10-CM

## 2016-03-21 DIAGNOSIS — A09 Infectious gastroenteritis and colitis, unspecified: Secondary | ICD-10-CM

## 2016-03-21 NOTE — Progress Notes (Signed)
Chief Complaint  Patient presents with  . Diarrhea    started late Sunday night and flared up again Monday. Tuesday was pure water all day. Wed didn't go all day long, went out to eat and had grilled cheese, chips and tea-10:30pm had diarrhea and didn't make it to the bathroom. Has not gone today-feels like she could but hasn't.   Sunday afternoon she developed diarrhea, associated with some nausea, but never vomited.  3-4 episodes of diarrhea on Sunday night, off and on all day Monday.  She had watery stools twice on Tuesday.  No diarrhea during the day yesterday, but had an accident on the way to the bathroom last night. She thought she would be fine last night because she was fine all day.  She wanted to have soup for dinner, but they were out, so had grilled cheese sandwich and chips.  The accident she had last night was watery brown stool.  She ate dry toast and crackers so far today.  She took imodium on Sunday, Monday (2-3 times), Tuesday (twice), and took 2 last night. She continues to take probiotic daily.   No known sick contacts, undercooked or spoiled foods, no travel, no recent antibiotics.  No known fever or chills.  She has felt cold at home.  Hypothyroidism:  Last TSH was through work in September. Denies changes in hair/skin/energy to suspect that thyroid feels off.  PMH, PSH, SH reviewed  Outpatient Encounter Prescriptions as of 03/21/2016  Medication Sig Note  . Calcium-Vitamin D (CALTRATE 600 PLUS-VIT D PO) Take 1 tablet by mouth daily.   07/27/2014: Takes daily  . Cholecalciferol (VITAMIN D) 1000 UNITS capsule Take 1,000 Units by mouth daily.     Marland Kitchen co-enzyme Q-10 30 MG capsule Take 100 mg by mouth daily.     . Cyanocobalamin (VITAMIN B 12 PO) Take 1 tablet by mouth.     . fish oil-omega-3 fatty acids 1000 MG capsule Take 2 g by mouth daily.    Marland Kitchen levothyroxine (SYNTHROID, LEVOTHROID) 88 MCG tablet Take 1 tablet (88 mcg total) by mouth daily.   Marland Kitchen losartan-hydrochlorothiazide  (HYZAAR) 50-12.5 MG tablet Take 1 tablet by mouth daily.   . Multiple Vitamins-Minerals (ALIVE WOMENS 50+ PO) Take by mouth.   . Naproxen Sodium (ALEVE PO) Take 1 tablet by mouth 2 (two) times daily.   Marland Kitchen omeprazole (PRILOSEC) 40 MG capsule Take 1 capsule (40 mg total) by mouth daily.   . Red Yeast Rice Extract (RED YEAST RICE PO) Take 4 tablets by mouth daily.     Marland Kitchen triamcinolone cream (KENALOG) 0.1 % Apply 1 application topically 2 (two) times daily. (Patient not taking: Reported on 02/21/2016)   . [DISCONTINUED] dicyclomine (BENTYL) 10 MG capsule Reported on 02/21/2016 02/21/2016: Received from: External Pharmacy  . [DISCONTINUED] meloxicam (MOBIC) 15 MG tablet  03/21/2016: Received from: External Pharmacy   No facility-administered encounter medications on file as of 03/21/2016.    ROS: no fever, chills, headaches, dizziness, URI symptoms, cough, shortness of breath, chest pain. Denies urinary symptoms. No bleeding, bruising, rash.  See HPI   PHYSICAL EXAM: BP 122/66 (BP Location: Left Arm, Patient Position: Sitting, Cuff Size: Normal)   Pulse 68   Ht 5' 4.5" (1.638 m)   Wt 175 lb 12.8 oz (79.7 kg)   BMI 29.71 kg/m   Well developed, pleasant female in no distress.  A little upset/embarrassed regarding the accident she had last night HEENT: PERRL, EOMI, conjunctiva and sclera are clear, MM moist  Neck: no lymphadenopathy, thyromegaly or mass Heart: regular rate and rhythm Lungs: clear bilaterally Abdomen: active bowel sounds.  Soft, Very mildly tender in epigastrium and diffusely. No masses  ASSESSMENT/PLAN:  Diarrhea of presumed infectious origin  Suspect virus; supportive measures   Drink plenty of water. Avoid dairy for at least 5-7 days. Continue BRAT diet (bananas, rice, applesauce, toast) and other bland foods, advance as tolerated.  Return if higher fever, worsening abdominal pain, blood or mucus in the stool, or persistent diarrhea.

## 2016-03-21 NOTE — Patient Instructions (Signed)
Drink plenty of water. Avoid dairy for at least 5-7 days. Continue BRAT diet (bananas, rice, applesauce, toast) and other bland foods, advance as tolerated.  Return if higher fever, worsening abdominal pain, blood or mucus in the stool, or persistent diarrhea.   Diarrhea Diarrhea is frequent loose and watery bowel movements. It can cause you to feel weak and dehydrated. Dehydration can cause you to become tired and thirsty, have a dry mouth, and have decreased urination that often is dark yellow. Diarrhea is a sign of another problem, most often an infection that will not last long. In most cases, diarrhea typically lasts 2-3 days. However, it can last longer if it is a sign of something more serious. It is important to treat your diarrhea as directed by your caregiver to lessen or prevent future episodes of diarrhea. CAUSES  Some common causes include:  Gastrointestinal infections caused by viruses, bacteria, or parasites.  Food poisoning or food allergies.  Certain medicines, such as antibiotics, chemotherapy, and laxatives.  Artificial sweeteners and fructose.  Digestive disorders. HOME CARE INSTRUCTIONS  Ensure adequate fluid intake (hydration): Have 1 cup (8 oz) of fluid for each diarrhea episode. Avoid fluids that contain simple sugars or sports drinks, fruit juices, whole milk products, and sodas. Your urine should be clear or pale yellow if you are drinking enough fluids. Hydrate with an oral rehydration solution that you can purchase at pharmacies, retail stores, and online. You can prepare an oral rehydration solution at home by mixing the following ingredients together:   - tsp table salt.   tsp baking soda.   tsp salt substitute containing potassium chloride.  1  tablespoons sugar.  1 L (34 oz) of water.  Certain foods and beverages may increase the speed at which food moves through the gastrointestinal (GI) tract. These foods and beverages should be avoided and  include:  Caffeinated and alcoholic beverages.  High-fiber foods, such as raw fruits and vegetables, nuts, seeds, and whole grain breads and cereals.  Foods and beverages sweetened with sugar alcohols, such as xylitol, sorbitol, and mannitol.  Some foods may be well tolerated and may help thicken stool including:  Starchy foods, such as rice, toast, pasta, low-sugar cereal, oatmeal, grits, baked potatoes, crackers, and bagels.  Bananas.  Applesauce.  Add probiotic-rich foods to help increase healthy bacteria in the GI tract, such as yogurt and fermented milk products.  Wash your hands well after each diarrhea episode.  Only take over-the-counter or prescription medicines as directed by your caregiver.  Take a warm bath to relieve any burning or pain from frequent diarrhea episodes. SEEK IMMEDIATE MEDICAL CARE IF:   You are unable to keep fluids down.  You have persistent vomiting.  You have blood in your stool, or your stools are black and tarry.  You do not urinate in 6-8 hours, or there is only a small amount of very dark urine.  You have abdominal pain that increases or localizes.  You have weakness, dizziness, confusion, or light-headedness.  You have a severe headache.  Your diarrhea gets worse or does not get better.  You have a fever or persistent symptoms for more than 2-3 days.  You have a fever and your symptoms suddenly get worse. MAKE SURE YOU:   Understand these instructions.  Will watch your condition.  Will get help right away if you are not doing well or get worse.   This information is not intended to replace advice given to you by your health care  provider. Make sure you discuss any questions you have with your health care provider.   Document Released: 07/26/2002 Document Revised: 08/26/2014 Document Reviewed: 04/12/2012 Elsevier Interactive Patient Education [employer].

## 2016-04-03 ENCOUNTER — Other Ambulatory Visit: Payer: Self-pay | Admitting: Family Medicine

## 2016-05-16 ENCOUNTER — Other Ambulatory Visit: Payer: Self-pay | Admitting: Family Medicine

## 2016-05-16 DIAGNOSIS — I10 Essential (primary) hypertension: Secondary | ICD-10-CM

## 2016-05-22 NOTE — Progress Notes (Signed)
Chief Complaint  Patient presents with  . Hip Pain    left worse than right today, requesting steroid shot in bursa.     Last injections for trochanteric bursitis 02/21/16, received bilateral cortisone shots.  Injections helped for about 2 months, but over the last month she started having recurrent pain in the left side, only slightly on the right.  She has been taking Aleve twice daily (242m) for the last month, and the pain isn't resolving.  She is requesting a cortisone injection in the left hip bursa.  PMH, [facility] reviewed  Outpatient Encounter Prescriptions as of 05/23/2016  Medication Sig Note  . Calcium-Vitamin D (CALTRATE 600 PLUS-VIT D PO) Take 1 tablet by mouth daily.   07/27/2014: Takes daily  . Cholecalciferol (VITAMIN D) 1000 UNITS capsule Take 1,000 Units by mouth daily.     .Marland Kitchenco-enzyme Q-10 30 MG capsule Take 100 mg by mouth daily.     . Cyanocobalamin (VITAMIN B 12 PO) Take 1 tablet by mouth.     . fish oil-omega-3 fatty acids 1000 MG capsule Take 2 g by mouth daily.    .Marland Kitchenlevothyroxine (SYNTHROID, LEVOTHROID) 88 MCG tablet Take 1 tablet (88 mcg total) by mouth daily.   .Marland Kitchenlosartan-hydrochlorothiazide (HYZAAR) 50-12.5 MG tablet TAKE ONE (1) TABLET BY MOUTH EVERY DAY   . Multiple Vitamins-Minerals (ALIVE WOMENS 50+ PO) Take by mouth.   . Naproxen Sodium (ALEVE PO) Take 1 tablet by mouth 2 (two) times daily. 05/23/2016: Has been taking 1 BID x 1 month.  .Marland Kitchenomeprazole (PRILOSEC) 40 MG capsule TAKE ONE (1) CAPSULE EACH DAY   . Red Yeast Rice Extract (RED YEAST RICE PO) Take 4 tablets by mouth daily.     .Marland Kitchentriamcinolone cream (KENALOG) 0.1 % Apply 1 application topically 2 (two) times daily. (Patient not taking: Reported on 05/23/2016)   . [EXPIRED] lidocaine (XYLOCAINE) 2 % (with pres) injection 30 mg    . [EXPIRED] triamcinolone acetonide (KENALOG-40) injection 20 mg     No facility-administered encounter medications on file as of 05/23/2016.    Allergies  Allergen Reactions  .  Ciprofloxacin     After she takes for a while it gives her a "funny feeling."  . Codeine Nausea Only  . Iodine Swelling  . Polysporin [Bacitracin-Polymyxin B] Swelling    Used for eye infection--got redder and worse  . Sulfa Antibiotics Swelling    Lips swell, itching  . Penicillins Rash    ROS: no fever, chills, URI symptoms, numbness, tingling, bleeding, bruising, or other complaints  PHYSICAL EXAM: BP (!) 150/90 (BP Location: Left Arm, Patient Position: Sitting, Cuff Size: Normal)   Pulse 68   Ht 5' 4.5" (1.638 m)   Wt 179 lb (81.2 kg)   BMI 30.25 kg/m   Exam: tender over left greater trochanter. No erythema or warmth; tenderness limited to area of bursa  Procedure: Area was cleansed with alcohol. Ethyl chloride spray was applied, then trochanteric bursa was injected with 217mof kenalog, and 1.5 cc of 2% lidocaine without epi.  She tolerated the procedure well without complication   ASSESSMENT/PLAN:  Trochanteric bursitis of left hip - Plan: triamcinolone acetonide (KENALOG-40) injection 20 mg, lidocaine (XYLOCAINE) 2 % (with pres) injection 30 mg, PR DRAIN/INJECT LARGE JOINT/BURSA  Need for prophylactic vaccination and inoculation against influenza - Plan: Flu vaccine HIGH DOSE PF (Fluzone High dose)

## 2016-05-23 ENCOUNTER — Ambulatory Visit (INDEPENDENT_AMBULATORY_CARE_PROVIDER_SITE_OTHER): Payer: Medicare Other | Admitting: Family Medicine

## 2016-05-23 ENCOUNTER — Encounter: Payer: Self-pay | Admitting: Family Medicine

## 2016-05-23 VITALS — BP 150/90 | HR 68 | Ht 64.5 in | Wt 179.0 lb

## 2016-05-23 DIAGNOSIS — Z23 Encounter for immunization: Secondary | ICD-10-CM

## 2016-05-23 DIAGNOSIS — M7062 Trochanteric bursitis, left hip: Secondary | ICD-10-CM

## 2016-05-23 MED ORDER — TRIAMCINOLONE ACETONIDE 40 MG/ML IJ SUSP
20.0000 mg | Freq: Once | INTRAMUSCULAR | Status: AC
  Administered 2016-05-23: 20 mg via INTRAMUSCULAR

## 2016-05-23 MED ORDER — LIDOCAINE HCL 2 % IJ SOLN
1.5000 mL | Freq: Once | INTRAMUSCULAR | Status: AC
  Administered 2016-05-23: 30 mg

## 2016-05-23 NOTE — Patient Instructions (Signed)
Hip Bursitis Bursitis is a swelling and soreness (inflammation) of a fluid-filled sac (bursa). This sac overlies and protects the joints.  CAUSES   Injury.  Overuse of the muscles surrounding the joint.  Arthritis.  Gout.  Infection.  Cold weather.  Inadequate warm-up and conditioning prior to activities. The cause may not be known.  SYMPTOMS   Mild to severe irritation.  Tenderness and swelling over the outside of the hip.  Pain with motion of the hip.  If the bursa becomes infected, a fever may be present. Redness, tenderness, and warmth will develop over the hip. Symptoms usually lessen in 3 to 4 weeks with treatment, but can come back. TREATMENT If conservative treatment does not work, your caregiver may advise draining the bursa and injecting cortisone into the area. This may speed up the healing process. This may also be used as an initial treatment of choice. HOME CARE INSTRUCTIONS   Apply ice to the affected area for 15-20 minutes every 3 to 4 hours while awake for the first 2 days. Put the ice in a plastic bag and place a towel between the bag of ice and your skin.  Rest the painful joint as much as possible, but continue to put the joint through a normal range of motion at least 4 times per day. When the pain lessens, begin normal, slow movements and usual activities to help prevent stiffness of the hip.  Only take over-the-counter or prescription medicines for pain, discomfort, or fever as directed by your caregiver.  Use crutches to limit weight bearing on the hip joint, if advised.  Elevate your painful hip to reduce swelling. Use pillows for propping and cushioning your legs and hips.  Gentle massage may provide comfort and decrease swelling. SEEK IMMEDIATE MEDICAL CARE IF:   Your pain increases even during treatment, or you are not improving.  You have a fever.  You have heat and inflammation over the involved bursa.  You have any other questions or  concerns. MAKE SURE YOU:   Understand these instructions.  Will watch your condition.  Will get help right away if you are not doing well or get worse.   This information is not intended to replace advice given to you by your health care provider. Make sure you discuss any questions you have with your health care provider.   Document Released: 01/25/2002 Document Revised: 10/28/2011 Document Reviewed: 03/07/2015 Elsevier Interactive Patient Education [employer].

## 2016-07-03 ENCOUNTER — Other Ambulatory Visit: Payer: Self-pay | Admitting: Family Medicine

## 2016-07-25 DIAGNOSIS — M1711 Unilateral primary osteoarthritis, right knee: Secondary | ICD-10-CM | POA: Diagnosis not present

## 2016-07-30 NOTE — Progress Notes (Signed)
Chief Complaint  Patient presents with  . Medicare Wellness    fasting annual wellness, no gyn sees Dr Stann Mainland and is UTD. No concerns today.     Mckenzie Webb is a 70 y.o. female who presents for annual wellness visit and follow-up on chronic medical conditions.  She has the following concerns:  She has a question about her vitamins if she is taking too many. Alive MVI has 500 mg of calcium, 1000 IU of D, 146mg B12 among others. She also has been taking 1 Ca +D (6055mCa, 400 IU D). She reports getting dairy in her diet--a glass of milk daily, and cheese on a sandwich.  She is also taking 100075mB12 daily and an extra 1000 IU of Vitamin D3.  Hypertension:  Diagnosed last year, started on losartan, later changed to losartan HCT. BP's are running 120's up to 140/80, usually 130/70.  Last night it was 124/72. She reports we have verified her BP monitor as accurate in the past. Denies dizziness, chest pain, palpitations, shortness of breath, edema, muscle cramps.  Occasional headache, no different from what is typical for her.  Hypothyroidism:  Denies any changes in hair, skin, or bowels. No changes in weight, energy, moods. Skin and hair are always dry, unchanged. Compliant with medications.  Psoriasis--doing well, no significant flares. The clobetasol was too expensive, will try and get it again when her prescription coverage changes in January. She thinks it is more effective than the TAC.  Hyperlipidemia: She continues to take red yeast rice (2 BID), and last year increased from 1 to 2 fish oil capsules daily   GERD--She has been on daily PPI, changed from Nexium to omeprazole for insurance reasons last year. She had recurrent symptoms when tried stopping the medication in the past. She also tried taking the OTC Nexium, but it wasn't effective.  Denies any dysphagia. She has questions regarding the safety of long-term medications.  Trochanteric bursitis: Currently doing better.  She  last had a steroid injection on the left side in October. It is improved, still has some pain bilaterally, when laying down, but is tolerable.  IFG:  Tries to limit sweets, carbs.  Only an occasional sweet tea. Getting daily exercise. Lab Results  Component Value Date   HGBA1C 5.9 (H) 07/31/2015    Immunization History  Administered Date(s) Administered  . Influenza Split 06/20/2011, 06/19/2012, 05/25/2014  . Influenza Whole 05/19/2000, 05/19/2001  . Influenza, High Dose Seasonal PF 05/23/2016  . Influenza-Unspecified 05/24/2015  . Pneumococcal Conjugate-13 07/26/2013  . Pneumococcal Polysaccharide-23 06/16/2000, 07/27/2014  . Td 04/13/2001  . Tdap 12/31/2010  . Zoster 07/26/2013   Last Pap smear: UTD through GYN  Last mammogram: 01/2016 Last colonoscopy: 3/09  Last DEXA: 12/2014, normal  Dentist: every 6 months  Ophtho: yearly Exercise: walking 1.5 miles (about 30 minutes), 5-7 days/week. No weight-bearing exercise other than picking up her 35# grandchild. Vitamin D level was normal in 2012  Other doctors caring for patient include: Ophtho: Dr. CotJorja LoaN: Dr. WeiStann Mainland: Dr. ManCollene Maresntist: can't recall the name Previously saw urologist at [facility] (for IC), not seen in years Ortho: Dr. WaiNoemi ChapelDepression, Fall and Functional Status screens were performed and unremarkable.  Please see questionnaires in epic.  End of Life Discussion:  Patient does not have a living will and medical power of attorney. She was given forms last year but admits she didn't do them yet.   Past Medical History:  Diagnosis Date  .  Allergy   . Diabetes mellitus 4/07   pt reports that all subsequent tests were normal, not diabetic  . DJD (degenerative joint disease), cervical 04 2002  . Edema    resolved  . GERD (gastroesophageal reflux disease)   . Hyperlipidemia   . Hypothyroid   . Interstitial cystitis   . Leukopenia   . Obesity   . Prediabetes 06/2014   fasting glucose 109;  A1c 5.7  . Psoriasis     Past Surgical History:  Procedure Laterality Date  . CHOLECYSTECTOMY    . COLONOSCOPY  3/09  . KNEE ARTHROSCOPY Right 1990  . KNEE ARTHROSCOPY Left 12/12/2010    (Dr. Noemi Chapel)  . TUBAL LIGATION  1983  . VARICOSE VEIN SURGERY  09/2009 R, 06/2010 L    Social History   Social History  . Marital status: Married    Spouse name: N/A  . Number of children: 2  . Years of education: N/A   Occupational History  . office work for [employer]   Social History Main Topics  . Smoking status: Never Smoker  . Smokeless tobacco: Never Used  . Alcohol use Yes     Comment: social, 1-2 times per year  . Drug use: No  . Sexual activity: Yes    Partners: Male   Other Topics Concern  . Not on file   Social History Narrative   Lives with husband, no pets.  Children in [place], [place] and [place].  1 granddaughter (in [place]). Retired 05/2014.    Family History  Problem Relation Age of Onset  . Hypertension Mother   . Heart disease Mother   . Gallbladder disease Mother   . Thyroid disease Mother   . Arthritis Mother   . Diabetes Father   . Cancer Father     pancreatic  . Hypertension Sister   . Gallbladder disease Sister   . Diabetes Sister   . Diverticulitis Sister     of small intestine  . Cancer Son     testicular cancer  . Stroke Sister     late 59's  . Hypertension Sister   . Pulmonary embolism Sister   . Ulcerative colitis Sister   . COPD Brother     had lung lobe removed for suspected cancer, but was benign; smoker  . Breast cancer Neg Hx   . Colon cancer Neg Hx     Outpatient Encounter Prescriptions as of 07/31/2016  Medication Sig Note  . Calcium-Vitamin D (CALTRATE 600 PLUS-VIT D PO) Take 1 tablet by mouth daily.   07/27/2014: Takes daily  . Cholecalciferol (VITAMIN D) 1000 UNITS capsule Take 1,000 Units by mouth daily.     Marland Kitchen co-enzyme Q-10 30 MG capsule Take 100 mg by mouth daily.     . Cyanocobalamin (VITAMIN B  12 PO) Take 1 tablet by mouth.   07/31/2016: 1000 mcg  . fish oil-omega-3 fatty acids 1000 MG capsule Take 2 g by mouth daily.    Marland Kitchen levothyroxine (SYNTHROID, LEVOTHROID) 88 MCG tablet Take 1 tablet (88 mcg total) by mouth daily.   Marland Kitchen losartan-hydrochlorothiazide (HYZAAR) 50-12.5 MG tablet TAKE ONE (1) TABLET BY MOUTH EVERY DAY   . Multiple Vitamins-Minerals (ALIVE WOMENS 50+ PO) Take by mouth. 07/31/2016: Has 585m Ca, 1000 IU of Vit D  . omeprazole (PRILOSEC) 40 MG capsule TAKE ONE (1) CAPSULE EACH DAY   . Red Yeast Rice Extract (RED YEAST RICE PO) Take 4 tablets by mouth daily.   07/31/2016: TDewaine Conger  2 BID  . Naproxen Sodium (ALEVE PO) Take 1 tablet by mouth 2 (two) times daily. 07/31/2016: Uses prn headaches, about 1x/week  . triamcinolone cream (KENALOG) 0.1 % Apply 1 application topically 2 (two) times daily. (Patient not taking: Reported on 07/31/2016)    No facility-administered encounter medications on file as of 07/31/2016.     Allergies  Allergen Reactions  . Ciprofloxacin     After she takes for a while it gives her a "funny feeling."  . Codeine Nausea Only  . Iodine Swelling  . Polysporin [Bacitracin-Polymyxin B] Swelling    Used for eye infection--got redder and worse  . Sulfa Antibiotics Swelling    Lips swell, itching  . Penicillins Rash     ROS: The patient denies anorexia, fever, vision changes, decreased hearing, ear pain, sore throat, breast concerns, chest pain, palpitations, dizziness, syncope, dyspnea on exertion, swelling, nausea, vomiting, melena, hematochezia, hematuria, incontinence, dysuria, vaginal bleeding, discharge, odor or itch, genital lesions, numbness, tingling, weakness, tremor, suspicious skin lesions, depression, anxiety, abnormal bleeding/bruising, or enlarged lymph nodes. No memory concerns. Has intermittent insomnia, relieved by Tylenol PM as needed (infrequent). hemorroids periodically flare, not currently. Low back pain, mild and constant for a  while (unchanged from last year). No radiation, numbness, tingling, weakness Intermittent diarrhea, overall improved with probiotics. Weight--some intentional loss over the last year. Morning nasal/sinus congestion.  Prn claritin helps. Constant slight cough, not frequent. Dry cough, described as deep. Not frequent and not bothersome.  PHYSICAL EXAM:  BP (!) 160/90 (BP Location: Left Arm, Patient Position: Sitting, Cuff Size: Normal)   Pulse 68   Ht 5' 4.5" (1.638 m)   Wt 177 lb (80.3 kg)   BMI 29.91 kg/m   114/62 on repeat by MD, RA  General Appearance:  Alert, cooperative, no distress, appears stated age   Head:  Normocephalic, without obvious abnormality, atraumatic   Eyes:  PERRL, conjunctiva/corneas clear, EOM's intact, fundi benign   Ears:  Normal TM's and external ear canals   Nose:  Nares normal, mucosa is normal, but some area of recent bleeding/scab on right septum; no sinus tenderness   Throat:  Lips, mucosa, and tongue normal; teeth and gums normal   Neck:  Supple, no lymphadenopathy; thyroid: no enlargement/tenderness/nodules; no carotid bruit or JVD   Back:  Spine nontender, no curvature, ROM normal, no CVA tenderness.   Lungs:  Clear to auscultation bilaterally without wheezes, rales or ronchi; respirations unlabored   Chest Wall:  No tenderness or deformity   Heart:  Regular rate and rhythm, S1 and S2 normal, no murmur, rub or gallop   Breast Exam:  Deferred to GYN   Abdomen:  Soft, nondistended, nontender, normoactive bowel sounds,  no masses, no hepatosplenomegaly. No rebound or guarding.  Genitalia:  Deferred to GYN      Extremities:  No clubbing, cyanosis or edema.  Pulses:  2+ and symmetric all extremities   Skin:  Skin color, texture, turgor normal, no rashes or lesions   Lymph nodes:  Cervical, supraclavicular, and axillary nodes normal   Neurologic:  CNII-XII intact, normal  strength, sensation and gait; reflexes 2+ and symmetric throughout    Psych:   Normal mood, affect, hygiene and grooming    ASSESSMENT/PLAN:  Medicare annual wellness visit, subsequent  Essential hypertension, benign - Plan: Lipid panel, Comprehensive metabolic panel, losartan-hydrochlorothiazide (HYZAAR) 50-12.5 MG tablet  Gastroesophageal reflux disease, esophagitis presence not specified - continue omeprazole; risks of longterm PPI vs untreated GERD reviewed in detail; continue  supplements  IFG (impaired fasting glucose) - due for recheck; encouraged proper diet, daily exercise, weight loss - Plan: Hemoglobin A1c  Pure hypercholesterolemia - Due for recheck; on RYR and 2g fish oil daily - Plan: Lipid panel  Medication monitoring encounter - Plan: Lipid panel, Comprehensive metabolic panel, CBC with Differential/Platelet, TSH  Hypothyroidism, unspecified type - euthyroid by history; due for TSH - Plan: TSH  Essential hypertension, benign - well controlled - Plan: Lipid panel, Comprehensive metabolic panel, losartan-hydrochlorothiazide (HYZAAR) 50-12.5 MG tablet   c-met, A1c, lipid, CBC, TSH  Discussed monthly self breast exams and yearly mammograms; at least 30 minutes of aerobic activity at least 5 days/week and weight-bearing exercise 2x/week; proper sunscreen use reviewed; healthy diet, including goals of calcium and vitamin D intake and alcohol recommendations (less than or equal to 1 drink/day) reviewed; regular seatbelt use; changing batteries in smoke detectors. Immunization recommendations discussed--UTD; continue high dose flu shots yearly. Discussed Shingrix, to consider when available. Colonoscopy recommendations reviewed, UTD  MOST form reviewed and updated. Full Code, Full care. Given new forms for Living Will and healthcare POA.  Encouraged to fill out and send [place] copy.  Chronic PPI use. Risks/benefits of treatment reviewed in detail. Continue  current vitamins. Reviewed current supplements and diet in detail--discussed recommendation for 1200-1563m of calcium daily from all sources.   Refill thyroid after labs back tomorrow  Use saline spray to nose  F/u 1 year, sooner if A1c rising, or other issues found on labs.   Medicare Attestation I have personally reviewed: The patient's medical and social history Their use of alcohol, tobacco or illicit drugs Their current medications and supplements The patient's functional ability including ADLs,fall risks, home safety risks, cognitive, and hearing and visual impairment Diet and physical activities Evidence for depression or mood disorders  The patient's weight, height, and BMI have been recorded in the chart.  I have made referrals, counseling, and provided education to the patient based on review of the above and I have provided the patient with a written personalized care plan for preventive services.     Bannon Giammarco A, MD   07/30/2016

## 2016-07-31 ENCOUNTER — Ambulatory Visit (INDEPENDENT_AMBULATORY_CARE_PROVIDER_SITE_OTHER): Payer: Medicare Other | Admitting: Family Medicine

## 2016-07-31 ENCOUNTER — Encounter: Payer: Self-pay | Admitting: Family Medicine

## 2016-07-31 VITALS — BP 114/62 | HR 68 | Ht 64.5 in | Wt 177.0 lb

## 2016-07-31 DIAGNOSIS — Z0000 Encounter for general adult medical examination without abnormal findings: Secondary | ICD-10-CM | POA: Diagnosis not present

## 2016-07-31 DIAGNOSIS — E7800 Pure hypercholesterolemia, unspecified: Secondary | ICD-10-CM | POA: Diagnosis not present

## 2016-07-31 DIAGNOSIS — I10 Essential (primary) hypertension: Secondary | ICD-10-CM | POA: Diagnosis not present

## 2016-07-31 DIAGNOSIS — Z5181 Encounter for therapeutic drug level monitoring: Secondary | ICD-10-CM

## 2016-07-31 DIAGNOSIS — R7301 Impaired fasting glucose: Secondary | ICD-10-CM

## 2016-07-31 DIAGNOSIS — K219 Gastro-esophageal reflux disease without esophagitis: Secondary | ICD-10-CM | POA: Diagnosis not present

## 2016-07-31 DIAGNOSIS — E039 Hypothyroidism, unspecified: Secondary | ICD-10-CM

## 2016-07-31 LAB — COMPREHENSIVE METABOLIC PANEL
ALBUMIN: 4.4 g/dL (ref 3.6–5.1)
ALT: 24 U/L (ref 6–29)
AST: 22 U/L (ref 10–35)
Alkaline Phosphatase: 53 U/L (ref 33–130)
BILIRUBIN TOTAL: 0.7 mg/dL (ref 0.2–1.2)
BUN: 16 mg/dL (ref 7–25)
CHLORIDE: 100 mmol/L (ref 98–110)
CO2: 29 mmol/L (ref 20–31)
CREATININE: 0.96 mg/dL (ref 0.50–0.99)
Calcium: 9.8 mg/dL (ref 8.6–10.4)
GLUCOSE: 99 mg/dL (ref 65–99)
Potassium: 4 mmol/L (ref 3.5–5.3)
SODIUM: 139 mmol/L (ref 135–146)
Total Protein: 7.8 g/dL (ref 6.1–8.1)

## 2016-07-31 LAB — CBC WITH DIFFERENTIAL/PLATELET
BASOS ABS: 0 {cells}/uL (ref 0–200)
BASOS PCT: 0 %
EOS ABS: 54 {cells}/uL (ref 15–500)
Eosinophils Relative: 1 %
HEMATOCRIT: 42.3 % (ref 35.0–45.0)
Hemoglobin: 14.4 g/dL (ref 11.7–15.5)
LYMPHS PCT: 32 %
Lymphs Abs: 1728 cells/uL (ref 850–3900)
MCH: 31.6 pg (ref 27.0–33.0)
MCHC: 34 g/dL (ref 32.0–36.0)
MCV: 93 fL (ref 80.0–100.0)
MONO ABS: 540 {cells}/uL (ref 200–950)
MONOS PCT: 10 %
MPV: 9.4 fL (ref 7.5–12.5)
Neutro Abs: 3078 cells/uL (ref 1500–7800)
Neutrophils Relative %: 57 %
Platelets: 213 10*3/uL (ref 140–400)
RBC: 4.55 MIL/uL (ref 3.80–5.10)
RDW: 13.2 % (ref 11.0–15.0)
WBC: 5.4 10*3/uL (ref 4.0–10.5)

## 2016-07-31 LAB — LIPID PANEL
CHOL/HDL RATIO: 3 ratio (ref <5.0)
Cholesterol: 252 mg/dL — ABNORMAL HIGH (ref <200)
HDL: 85 mg/dL (ref >50)
LDL CALC: 145 mg/dL — AB (ref <100)
Triglycerides: 111 mg/dL (ref <150)
VLDL: 22 mg/dL (ref <30)

## 2016-07-31 LAB — TSH: TSH: 2.14 mIU/L

## 2016-07-31 MED ORDER — LOSARTAN POTASSIUM-HCTZ 50-12.5 MG PO TABS
ORAL_TABLET | ORAL | 3 refills | Status: DC
Start: 2016-07-31 — End: 2017-05-03

## 2016-07-31 NOTE — Patient Instructions (Signed)
  HEALTH MAINTENANCE RECOMMENDATIONS:  It is recommended that you get at least 30 minutes of aerobic exercise at least 5 days/week (for weight loss, you may need as much as 60-90 minutes). This can be any activity that gets your heart rate up. This can be divided in 10-15 minute intervals if needed, but try and build up your endurance at least once a week.  Weight bearing exercise is also recommended twice weekly.  Eat a healthy diet with lots of vegetables, fruits and fiber.  "Colorful" foods have a lot of vitamins (ie green vegetables, tomatoes, red peppers, etc).  Limit sweet tea, regular sodas and alcoholic beverages, all of which has a lot of calories and sugar.  Up to 1 alcoholic drink daily may be beneficial for women (unless trying to lose weight, watch sugars).  Drink a lot of water.  Calcium recommendations are 1200-1500 mg daily (1500 mg for postmenopausal women or women without ovaries), and vitamin D 1000 IU daily.  This should be obtained from diet and/or supplements (vitamins), and calcium should not be taken all at once, but in divided doses.  Monthly self breast exams and yearly mammograms for women over the age of 28 is recommended.  Sunscreen of at least SPF 30 should be used on all sun-exposed parts of the skin when outside between the hours of 10 am and 4 pm (not just when at beach or pool, but even with exercise, golf, tennis, and yard work!)  Use a sunscreen that says "broad spectrum" so it covers both UVA and UVB rays, and make sure to reapply every 1-2 hours.  Remember to change the batteries in your smoke detectors when changing your clock times in the spring and fall.  Use your seat belt every time you are in a car, and please drive safely and not be distracted with cell phones and texting while driving.    Mckenzie Webb , Thank you for taking time to come for your Medicare Wellness Visit. I appreciate your ongoing commitment to your health goals. Please review the  following plan we discussed and let me know if I can assist you in the future.   These are the goals we discussed: Goals    None      This is a list of the screening recommended for you and due dates:  Health Maintenance  Topic Date Due  . Complete foot exam   08/16/1956  . Eye exam for diabetics  08/16/1956  . Hemoglobin A1C  01/29/2016  . Colon Cancer Screening  10/25/2017  . Mammogram  01/24/2018  . Tetanus Vaccine  12/30/2020  . Flu Shot  Completed  . DEXA scan (bone density measurement)  Completed  . Shingles Vaccine  Completed  .  Hepatitis C: One time screening is recommended by Center for Disease Control  (CDC) for  adults born from [birth year] through 1963.   Completed  . Pneumonia vaccines  Completed   You are not diabetic, and special eye exams and foot exams aren't needed. Continue regular eye exams. A1c can be checked once yearly, but if increasing, we will check it twice a year. The mammogram date above is wrong--I recommend yearly, so due again in 2018, not 2019 as stated above. I do recommend the new shingles vaccine when it is available (Shingrix)--check with your insurance, and you may need to get it from the pharmacy. It is a series of 2 injections.

## 2016-08-01 ENCOUNTER — Encounter: Payer: Self-pay | Admitting: Family Medicine

## 2016-08-01 LAB — HEMOGLOBIN A1C
Hgb A1c MFr Bld: 5.4 % (ref <5.7)
MEAN PLASMA GLUCOSE: 108 mg/dL

## 2016-08-01 MED ORDER — LEVOTHYROXINE SODIUM 88 MCG PO TABS
88.0000 ug | ORAL_TABLET | Freq: Every day | ORAL | 3 refills | Status: DC
Start: 2016-08-01 — End: 2017-07-22

## 2016-08-01 NOTE — Addendum Note (Signed)
Addended by: Rita Ohara on: 08/01/2016 07:33 AM   Modules accepted: Orders

## 2016-10-03 ENCOUNTER — Other Ambulatory Visit: Payer: Self-pay | Admitting: Family Medicine

## 2016-10-23 ENCOUNTER — Ambulatory Visit (INDEPENDENT_AMBULATORY_CARE_PROVIDER_SITE_OTHER): Payer: Medicare Other | Admitting: Family Medicine

## 2016-10-23 ENCOUNTER — Encounter: Payer: Self-pay | Admitting: Family Medicine

## 2016-10-23 VITALS — BP 138/72 | HR 76 | Temp 100.0°F | Ht 64.5 in | Wt 184.0 lb

## 2016-10-23 DIAGNOSIS — M7061 Trochanteric bursitis, right hip: Secondary | ICD-10-CM | POA: Diagnosis not present

## 2016-10-23 DIAGNOSIS — M7062 Trochanteric bursitis, left hip: Secondary | ICD-10-CM | POA: Diagnosis not present

## 2016-10-23 DIAGNOSIS — J0100 Acute maxillary sinusitis, unspecified: Secondary | ICD-10-CM

## 2016-10-23 MED ORDER — LIDOCAINE HCL 2 % IJ SOLN
1.5000 mL | Freq: Once | INTRAMUSCULAR | Status: AC
  Administered 2016-10-23: 30 mg

## 2016-10-23 MED ORDER — TRIAMCINOLONE ACETONIDE 40 MG/ML IJ SUSP
20.0000 mg | Freq: Once | INTRAMUSCULAR | Status: AC
  Administered 2016-10-23: 20 mg via INTRAMUSCULAR

## 2016-10-23 MED ORDER — AZITHROMYCIN 250 MG PO TABS: ORAL_TABLET | ORAL | 0 refills | Status: DC

## 2016-10-23 NOTE — Patient Instructions (Signed)
Drink plenty of water. Use saline mist frequently throughout the day. Consider using sinus rinses or netipot to help with sinus pain. Given the length of symptoms and lowgrade fever, will treat for infection. Take the z-pak as directed.  Remember that it works for 10 days, even though you only take it for 5.  Contact us on day 10 for a refill if you aren't 100% better (to start the refill on day 11); vs contact us in 5-7 days if getting worse rather than better (ie high fevers, etc).   Hip Bursitis Hip bursitis is inflammation of a fluid-filled sac (bursa) in the hip joint. The bursa protects the bones in the hip joint from rubbing against each other. Hip bursitis can cause mild to moderate pain, and symptoms often come and go over time. What are the causes? This condition may be caused by:  Injury to the hip.  Overuse of the muscles that surround the hip joint.  Arthritis or gout.  Diabetes.  Thyroid disease.  Cold weather.  Infection. In some cases, the cause may not be known. What are the signs or symptoms? Symptoms of this condition may include:  Mild or moderate pain in the hip area. Pain may get worse with movement.  Tenderness and swelling of the hip, especially on the outer side of the hip. Symptoms may come and go. If the bursa becomes infected, you may have the following symptoms:  Fever.  Red skin and a feeling of warmth in the hip area. How is this diagnosed? This condition may be diagnosed based on:  A physical exam.  Your medical history.  X-rays.  Removal of fluid from your inflamed bursa for testing (biopsy). You may be sent to a health care provider who specializes in bone diseases (orthopedist) or a provider who specializes in joint inflammation (rheumatologist). How is this treated? This condition is treated by resting, raising (elevating), and applying pressure(compression) to the injured area. In some cases, this may be enough to make your  symptoms go away. Treatment may also include:  Crutches.  Antibiotic medicine.  Draining fluid out of the bursa to help relieve swelling.  Injecting medicine that helps to reduce inflammation (cortisone). Follow these instructions at home: Medicines   Take over-the-counter and prescription medicines only as told by your health care provider.  Do not drive or operate heavy machinery while taking prescription pain medicine, or as told by your health care provider.  If you were prescribed an antibiotic, take it as told by your health care provider. Do not stop taking the antibiotic even if you start to feel better. Activity   Return to your normal activities as told by your health care provider. Ask your health care provider what activities are safe for you.  Rest and protect your hip as much as possible until your pain and swelling get better. General instructions   Wear compression wraps only as told by your health care provider.  Elevate your hip above the level of your heart as much as you can without pain. To do this, try putting a pillow under your hips while you lie down.  Do not use your hip to support your body weight until your health care provider says that you can. Use crutches as told by your health care provider.  Gently massage and stretch your injured area as often as is comfortable.  Keep all follow-up visits as told by your health care provider. This is important. How is this prevented?  Exercise  regularly, as told by your health care provider.  Warm up and stretch before being active.  Cool down and stretch after being active.  If an activity irritates your hip or causes pain, avoid the activity as much as possible.  Avoid sitting down for long periods at a time. Contact a health care provider if:  You have a fever.  You develop new symptoms.  You have difficulty walking or doing everyday activities.  You have pain that gets worse or does not get  better with medicine.  You develop red skin or a feeling of warmth in your hip area. Get help right away if:  You cannot move your hip.  You have severe pain. This information is not intended to replace advice given to you by your health care provider. Make sure you discuss any questions you have with your health care provider. Document Released: 01/25/2002 Document Revised: 01/11/2016 Document Reviewed: 03/07/2015 Elsevier Interactive Patient Education  2017 [employer].

## 2016-10-23 NOTE — Progress Notes (Signed)
Chief Complaint  Patient presents with  . Hip Pain    B/L hip pain, would like hip injections if possible.  . Facial Pain    and bloody mucus. Says her right ear has been hurting as well-just not feeling well.    She has had ongoing pain in her hips, present at her physical in December, getting worse, now to the point where "she can't stand it". She has been taking Advil, but when she worries about side effects, so then switches to Tylenol.  She sometimes also takes Aleve. Today she took 2 advil at 8 this morning.  She denies any abdominal pain, bleeding, bruising. She has gotten some good results with cortisone injection in the past, and would like bilateral injections today.  Last injections-- L 05/2016, and bilateral injections done 02/2016  Several weeks ago she had a pain behind her right ear--a very focal, sharp headache. She had some residual soreness at her scalp. This completely resolved.  But then she started noticing some bloody nasal drainage. She has some pressure in her right cheek, feels swollen, some pain in her right upper teeth.  These symptoms have been present for a couple of weeks.  She wasn't aware of any fever or chills.  Denies any discolored mucus, just blood.  She has used Ocean liquid in her nose (not a mist), which helps some.  Denies any sniffles, sneeze, watery eyes. She has some drainage in her throat just in the morning.  No other postnasal drainage, no significant cough.  PMH ,PSH, SH reviewed  Outpatient Encounter Prescriptions as of 10/23/2016  Medication Sig Note  . Calcium-Vitamin D (CALTRATE 600 PLUS-VIT D PO) Take 1 tablet by mouth daily.   07/27/2014: Takes daily  . Cholecalciferol (VITAMIN D) 1000 UNITS capsule Take 1,000 Units by mouth daily.     Marland Kitchen co-enzyme Q-10 30 MG capsule Take 100 mg by mouth daily.     . Cyanocobalamin (VITAMIN B 12 PO) Take 1 tablet by mouth.   07/31/2016: 1000 mcg  . fish oil-omega-3 fatty acids 1000 MG capsule Take 2 g by mouth  daily.    Marland Kitchen levothyroxine (SYNTHROID, LEVOTHROID) 88 MCG tablet Take 1 tablet (88 mcg total) by mouth daily.   Marland Kitchen losartan-hydrochlorothiazide (HYZAAR) 50-12.5 MG tablet TAKE ONE (1) TABLET BY MOUTH EVERY DAY   . Multiple Vitamins-Minerals (ALIVE WOMENS 50+ PO) Take by mouth. 07/31/2016: Has 573m Ca, 1000 IU of Vit D  . Naproxen Sodium (ALEVE PO) Take 1 tablet by mouth 2 (two) times daily. 07/31/2016: Uses prn headaches, about 1x/week  . omeprazole (PRILOSEC) 40 MG capsule TAKE ONE (1) CAPSULE EACH DAY   . Probiotic Product (PROBIOTIC DAILY PO) Take 1 tablet by mouth daily. 07/31/2016: Takes Ultra Flora IB daily  . Red Yeast Rice Extract (RED YEAST RICE PO) Take 4 tablets by mouth daily.   07/31/2016: Takes 2 BID  . triamcinolone cream (KENALOG) 0.1 % Apply 1 application topically 2 (two) times daily.    No facility-administered encounter medications on file as of 10/23/2016.    Allergies  Allergen Reactions  . Ciprofloxacin     After she takes for a while it gives her a "funny feeling."  . Codeine Nausea Only  . Iodine Swelling  . Polysporin [Bacitracin-Polymyxin B] Swelling    Used for eye infection--got redder and worse  . Sulfa Antibiotics Swelling    Lips swell, itching  . Penicillins Rash    ROS:  No known fever, chills, no ear  pain, cough, shortness of breath, GI complaints, bleeding, bruising. +sinus headache, bloody nasal drainage and bilateral hip pain  PHYSICAL EXAM:  BP 138/72 (BP Location: Left Arm, Patient Position: Sitting, Cuff Size: Normal)   Pulse 76   Temp 100 F (37.8 C) (Tympanic)   Ht 5' 4.5" (1.638 m)   Wt 184 lb (83.5 kg)   BMI 31.10 kg/m    Well appearing, pleasant female in no distress HEENT: PERRL, EOMI, conjunctiva and sclera are clear. TM's and EACs normal.  Nasal mucosa is mildly edematous, clear-white stringy mucus present in nares. No erythema, no bleeding/clots.  OP is clear, slightly erythematous posteriorly.  +tender over right frontal and  right maxillary sinus. Neck: no lymphadenopathy or mass Heart: regular rate and rhythm without murmur Lungs: clear bilaterally Abdomen: soft, nontender, no organomegaly or mass Extremities: no edema.  Tender at trochanteric bursa bilaterally.   Verbal consent for procedure was obtained. She understands the risks and alternatives, and wishes to proceed with injections.  Procedure: Area was cleansed with alcohol. Ethyl chloride spray was applied, then trochanteric bursa was injected with 53m of kenalog, and 1.5 cc of 2% lidocaine without epi.  The same procedure was done bilaterally. She tolerated both without any complication, and had some immediate improvement in pain.  ASSESSMENT/PLAN:  Acute non-recurrent maxillary sinusitis - supportive measures reviewed--saline, neti-pot; start ABX, contact [place] not improving [place] directed - Plan: azithromycin (ZITHROMAX) 250 MG tablet  Trochanteric bursitis of both hips - s/p bilateral cortisone injections with immediate improvement - Plan: triamcinolone acetonide (KENALOG-40) injection 20 mg, triamcinolone acetonide (KENALOG-40) injection 20 mg, lidocaine (XYLOCAINE) 2 % (with pres) injection 30 mg, lidocaine (XYLOCAINE) 2 % (with pres) injection 30 mg, PR DRAIN/INJECT LARGE JOINT/BURSA   Acute sinusitis (frontal and maxillary) Last z-pak 05/2015; re-treat with z-pak given her multiple allergies.    Drink plenty of water. Use saline mist frequently throughout the day. Consider using sinus rinses or netipot to help with sinus pain. Given the length of symptoms and lowgrade fever, will treat for infection. Take the z-pak as directed.  Remember that it works for 10 days, even though you only take it for 5.  Contact [place] 10 for a refill if you aren't 100% better (to start the refill on day 11); vs contact [place] 5-7 days if getting worse rather than better (ie high fevers, etc).

## 2016-10-30 ENCOUNTER — Encounter: Payer: Self-pay | Admitting: Family Medicine

## 2016-10-30 ENCOUNTER — Ambulatory Visit (INDEPENDENT_AMBULATORY_CARE_PROVIDER_SITE_OTHER): Payer: Medicare Other | Admitting: Family Medicine

## 2016-10-30 VITALS — BP 130/70 | HR 68 | Temp 98.3°F | Ht 64.5 in | Wt 179.4 lb

## 2016-10-30 DIAGNOSIS — R42 Dizziness and giddiness: Secondary | ICD-10-CM | POA: Diagnosis not present

## 2016-10-30 DIAGNOSIS — M7061 Trochanteric bursitis, right hip: Secondary | ICD-10-CM

## 2016-10-30 DIAGNOSIS — M7062 Trochanteric bursitis, left hip: Secondary | ICD-10-CM | POA: Diagnosis not present

## 2016-10-30 DIAGNOSIS — J0100 Acute maxillary sinusitis, unspecified: Secondary | ICD-10-CM

## 2016-10-30 NOTE — Progress Notes (Signed)
Chief Complaint  Patient presents with  . Dizziness    and headache. Finished zpak Sunday night. Started last night in the middle of the night when she got up to urinate. Had a sinus HA yest and took a claritin. Feels like room is spinning. No nausea or vomiting.    Seen 3/7 for cortisone injections to both trochanteric bursae. Also treated with z-pak for acute sinusitis.  She reports that her hips are much better.  She continues to have pain over the right side of her head. She took claritin yesterday--helped with her headache. She is wondering if that could be causing the dizziness that developed early this morning.  Last night she got up to go to the bathroom.  She felt wobbly, off balance, spinning.  When she got back in bed, she had a bad headache at the top of her head.  Dizziness persisted.  This happened at 4 am, got back to sleep at 6am.  Woke up at 9 and everything was spinning.  Headache persists.  Pressure at her right side of her face (temple, sinuses).  Nasal drainage still has slight blood.  Doing sinus rinses--not seeing any discolored mucus. She feels like stuff is "stuck in them".Teeth are no longer hurting, like they were at last visit.  She denies any fevers (had low grade at her last visit). Slight PND, occasional cough.  Took claritin mid-day yesterday. Took regular meds last night. She has been taking Advil for the headache, last dose was 2 tablets 4-5 hours ago.  She doesn't think it really helped much.  When she closes her eyes and opens them again, she feels dizzy, even without moving her head.  If she moves her head, "the world spins".  Never had vertigo in the past.  Denies any other neurologic symptoms. Slight residual soreness to the scalp behind the right ear, but no longer painful.  She reports that her blood pressure was 179/91 when she woke up this morning (had headache and dizziness).  PMH, PSH, SH reviewed  Outpatient Encounter Prescriptions as of 10/30/2016   Medication Sig Note  . Calcium-Vitamin D (CALTRATE 600 PLUS-VIT D PO) Take 1 tablet by mouth daily.   07/27/2014: Takes daily  . Cholecalciferol (VITAMIN D) 1000 UNITS capsule Take 1,000 Units by mouth daily.     Marland Kitchen co-enzyme Q-10 30 MG capsule Take 100 mg by mouth daily.     . Cyanocobalamin (VITAMIN B 12 PO) Take 1 tablet by mouth.   07/31/2016: 1000 mcg  . fish oil-omega-3 fatty acids 1000 MG capsule Take 2 g by mouth daily.    Marland Kitchen ibuprofen (ADVIL,MOTRIN) 200 MG tablet Take 200 mg by mouth every 6 (six) hours as needed. 10/23/2016: Uses prn hip pain.  Took 2 this morning  . levothyroxine (SYNTHROID, LEVOTHROID) 88 MCG tablet Take 1 tablet (88 mcg total) by mouth daily.   Marland Kitchen losartan-hydrochlorothiazide (HYZAAR) 50-12.5 MG tablet TAKE ONE (1) TABLET BY MOUTH EVERY DAY   . Multiple Vitamins-Minerals (ALIVE WOMENS 50+ PO) Take by mouth. 07/31/2016: Has 571m Ca, 1000 IU of Vit D  . omeprazole (PRILOSEC) 40 MG capsule TAKE ONE (1) CAPSULE EACH DAY   . Probiotic Product (PROBIOTIC DAILY PO) Take 1 tablet by mouth daily. 07/31/2016: Takes Ultra Flora IB daily  . Red Yeast Rice Extract (RED YEAST RICE PO) Take 4 tablets by mouth daily.   07/31/2016: Takes 2 BID  . triamcinolone cream (KENALOG) 0.1 % Apply 1 application topically 2 (two) times daily.   .Marland Kitchen  Naproxen Sodium (ALEVE PO) Take 1 tablet by mouth 2 (two) times daily. 07/31/2016: Uses prn headaches, about 1x/week  . [DISCONTINUED] azithromycin (ZITHROMAX) 250 MG tablet Take 2 tablets by mouth on first day, then 1 tablet by mouth on days 2 through 5    No facility-administered encounter medications on file as of 10/30/2016.    Allergies  Allergen Reactions  . Ciprofloxacin     After she takes for a while it gives her a "funny feeling."  . Codeine Nausea Only  . Iodine Swelling  . Polysporin [Bacitracin-Polymyxin B] Swelling    Used for eye infection--got redder and worse  . Sulfa Antibiotics Swelling    Lips swell, itching  . Penicillins Rash    ROS:  No fever, chills, numbness, tingling, weakness, trouble with word finding, speech, memory.  She denies chest pain, shortness of breath.  +URI symptoms as per HPI.  +headache and vertigo per HPI.  Denies ear pain,  Hearing loss.  No bleeding, bruising, rash.  Denies GI complaints  PHYSICAL EXAM:  BP 130/70 (BP Location: Right Arm, Patient Position: Standing, Cuff Size: Normal)   Pulse 68   Ht 5' 4.5" (1.638 m)   Wt 179 lb 6.4 oz (81.4 kg)   BMI 30.32 kg/m   T 98.3  Well appearing female in no distress.  Moving slowly when walking down the hall, but walking straight, normal base of gait.  Holding arm out, but not needing to touch the wall at all. HEENT: PERRL, EOMI, conjunctiva and sclera are clear.  No nystagmus.  Fundi benign.  TM's and EACs normal. Clear stringy mucus on R, no erythema or purulence. OP clear Tender over right temporalis muscle, bilateral frontal sinuses and right maxillary sinus. nontender at mastoid Neck: no lymphadenopathy or mass Heart: regular rate and rhythm Lungs: clear bilaterally Extremities: no edema Neuro: alert, oriented, cranial nerves intact.  Normal strength, sensation, gait (slow/cautious, no staggering; tandem gait not tested).  DTR's symmetric.   No vertigo triggered with position changes.   ASSESSMENT/PLAN:  Vertigo - acute, with no other neuro symptoms; not positional.  Suspect viral. meclizine/supportive measures. Red flags for emergent eval reviewed  Acute non-recurrent maxillary sinusitis - no longer febrile or having teeth pain, but still having signif sinus pressure; continue sinus rinse, add mucinex. Allergy component--cont claritin  Trochanteric bursitis of both hips - significantly improved s/p cortisone injection last week  Continue supportive measures for sinuses--avoid decongestants. May have allergic component--continue claritin. Risks/side effects of meclizine reviewed.    Continue to drink plenty of water. You may  continue to use claritin as needed for any allergies. Continue sinus rinses twice daily. Consider adding guaifenesin (ie Mucinex) to help loosen any thick secretions that might be hung up in your sinuses.  Try hot showers. Continue advil as needed, along with tylenol/acetaminophen as needed. Your neurologic exam was normal.  You didn't have much dizziness while in the office.  I suspect you have an inner ear problem causing the vertigo--this may be related to your sinuses/allergies. I recommend using meclizine 12.5-25 mg every 6-8 hours as needed for vertigo (room-spinning dizzy sensation).  This is found over-the-counter in various formulations such as Bonine, some of the less-drowsy dramamine formulations.  You can check with the pharmacy if you aren't sure.     Seek immediate care if you develop other neurologic symptoms--trouble with slurred speech, weakness, numbness, trouble with memory.

## 2016-10-30 NOTE — Patient Instructions (Signed)
Continue to drink plenty of water. You may continue to use claritin as needed for any allergies. Continue sinus rinses twice daily. Consider adding guaifenesin (ie Mucinex) to help loosen any thick secretions that might be hung up in your sinuses.  Try hot showers. Continue advil as needed, along with tylenol/acetaminophen as needed. Your neurologic exam was normal.  You didn't have much dizziness while in the office.  I suspect you have an inner ear problem causing the vertigo--this may be related to your sinuses/allergies. I recommend using meclizine 12.5-25 mg every 6-8 hours as needed for vertigo (room-spinning dizzy sensation).  This is found over-the-counter in various formulations such as Bonine, some of the less-drowsy dramamine formulations.  You can check with the pharmacy if you aren't sure.      Dizziness Dizziness is a common problem. It is a feeling of unsteadiness or light-headedness. You may feel like you are about to faint. Dizziness can lead to injury if you stumble or fall. Anyone can become dizzy, but dizziness is more common in older adults. This condition can be caused by a number of things, including medicines, dehydration, or illness. Follow these instructions at home: Taking these steps may help with your condition: Eating and drinking   Drink enough fluid to keep your urine clear or pale yellow. This helps to keep you from becoming dehydrated. Try to drink more clear fluids, such as water.  Do not drink alcohol.  Limit your caffeine intake if directed by your health care provider.  Limit your salt intake if directed by your health care provider. Activity   Avoid making quick movements.  Rise slowly from chairs and steady yourself until you feel okay.  In the morning, first sit up on the side of the bed. When you feel okay, stand slowly while you hold onto something until you know that your balance is fine.  Move your legs often if you need to stand in one  place for a long time. Tighten and relax your muscles in your legs while you are standing.  Do not drive or operate heavy machinery if you feel dizzy.  Avoid bending down if you feel dizzy. Place items in your home so that they are easy for you to reach without leaning over. Lifestyle   Do not use any tobacco products, including cigarettes, chewing tobacco, or electronic cigarettes. If you need help quitting, ask your health care provider.  Try to reduce your stress level, such as with yoga or meditation. Talk with your health care provider if you need help. General instructions   Watch your dizziness for any changes.  Take medicines only as directed by your health care provider. Talk with your health care provider if you think that your dizziness is caused by a medicine that you are taking.  Tell a friend or a family member that you are feeling dizzy. If he or she notices any changes in your behavior, have this person call your health care provider.  Keep all follow-up visits as directed by your health care provider. This is important. Contact a health care provider if:  Your dizziness does not go away.  Your dizziness or light-headedness gets worse.  You feel nauseous.  You have reduced hearing.  You have new symptoms.  You are unsteady on your feet or you feel like the room is spinning. Get help right away if:  You vomit or have diarrhea and are unable to eat or drink anything.  You have problems talking, walking,  swallowing, or using your arms, hands, or legs.  You feel generally weak.  You are not thinking clearly or you have trouble forming sentences. It may take a friend or family member to notice this.  You have chest pain, abdominal pain, shortness of breath, or sweating.  Your vision changes.  You notice any bleeding.  You have a headache.  You have neck pain or a stiff neck.  You have a fever. This information is not intended to replace advice given to  you by your health care provider. Make sure you discuss any questions you have with your health care provider. Document Released: 01/29/2001 Document Revised: 01/11/2016 Document Reviewed: 08/01/2014 Elsevier Interactive Patient Education  2017 [employer] immediate care if you develop other neurologic symptoms--trouble with slurred speech, weakness, numbness, trouble with memory.

## 2016-12-24 DIAGNOSIS — M25552 Pain in left hip: Secondary | ICD-10-CM | POA: Diagnosis not present

## 2016-12-24 DIAGNOSIS — M25551 Pain in right hip: Secondary | ICD-10-CM | POA: Diagnosis not present

## 2016-12-24 DIAGNOSIS — M545 Low back pain: Secondary | ICD-10-CM | POA: Diagnosis not present

## 2017-01-02 DIAGNOSIS — M545 Low back pain: Secondary | ICD-10-CM | POA: Diagnosis not present

## 2017-01-06 DIAGNOSIS — M545 Low back pain: Secondary | ICD-10-CM | POA: Diagnosis not present

## 2017-01-23 ENCOUNTER — Encounter: Payer: Self-pay | Admitting: Family Medicine

## 2017-01-23 ENCOUNTER — Ambulatory Visit (INDEPENDENT_AMBULATORY_CARE_PROVIDER_SITE_OTHER): Payer: Medicare Other | Admitting: Family Medicine

## 2017-01-23 VITALS — BP 138/72 | HR 68 | Ht 60.0 in | Wt 179.8 lb

## 2017-01-23 DIAGNOSIS — M7062 Trochanteric bursitis, left hip: Secondary | ICD-10-CM | POA: Diagnosis not present

## 2017-01-23 DIAGNOSIS — M25551 Pain in right hip: Secondary | ICD-10-CM

## 2017-01-23 DIAGNOSIS — M25552 Pain in left hip: Secondary | ICD-10-CM | POA: Diagnosis not present

## 2017-01-23 DIAGNOSIS — M7061 Trochanteric bursitis, right hip: Secondary | ICD-10-CM

## 2017-01-23 MED ORDER — TRIAMCINOLONE ACETONIDE 40 MG/ML IJ SUSP
20.0000 mg | Freq: Once | INTRAMUSCULAR | Status: AC
  Administered 2017-01-23: 20 mg via INTRAMUSCULAR

## 2017-01-23 MED ORDER — LIDOCAINE HCL 2 % IJ SOLN
1.5000 mL | Freq: Once | INTRAMUSCULAR | Status: AC
  Administered 2017-01-23: 30 mg via INTRADERMAL

## 2017-01-23 NOTE — Progress Notes (Signed)
Chief Complaint  Patient presents with  . Hip Pain    B/L hip pain and would like injections today.    Recurrent pain in both hips over the last 2-2.5 weeks.  No change in activity or any known trigger.  She has tried Aleve, but pain persists. She would like bilateral cortisone shots, as these have been very effective for her in the past.  Last injection 3/7, done bilaterally. Prior to that was on the left in October, bilateral injections 02/2016.  She went to Dr. French Ana with her hip pain a few weeks ago.  He did x-rays and MRI (no records available). She reports he doesn't feel like it is her hip joint.  She has some mild problems in her back (disks, stenosis).  She is being referred to Dr. Sherwood Gambler. Slight discomfort in her low back, denies sciatica, numbness, tingling or weakness in her legs.   PMH, PSH, SH reviewed  Outpatient Encounter Prescriptions as of 01/23/2017  Medication Sig Note  . Calcium-Vitamin D (CALTRATE 600 PLUS-VIT D PO) Take 1 tablet by mouth daily.   07/27/2014: Takes daily  . Cholecalciferol (VITAMIN D) 1000 UNITS capsule Take 1,000 Units by mouth daily.     Marland Kitchen co-enzyme Q-10 30 MG capsule Take 100 mg by mouth daily.     . Cyanocobalamin (VITAMIN B 12 PO) Take 1 tablet by mouth.   07/31/2016: 1000 mcg  . fish oil-omega-3 fatty acids 1000 MG capsule Take 2 g by mouth daily.    Marland Kitchen levothyroxine (SYNTHROID, LEVOTHROID) 88 MCG tablet Take 1 tablet (88 mcg total) by mouth daily.   Marland Kitchen losartan-hydrochlorothiazide (HYZAAR) 50-12.5 MG tablet TAKE ONE (1) TABLET BY MOUTH EVERY DAY   . Multiple Vitamins-Minerals (ALIVE WOMENS 50+ PO) Take by mouth. 07/31/2016: Has 589m Ca, 1000 IU of Vit D  . omeprazole (PRILOSEC) 40 MG capsule TAKE ONE (1) CAPSULE EACH DAY   . Probiotic Product (PROBIOTIC DAILY PO) Take 1 tablet by mouth daily. 07/31/2016: Takes Ultra Flora IB daily  . Red Yeast Rice Extract (RED YEAST RICE PO) Take 4 tablets by mouth daily.   07/31/2016: Takes 2 BID  . ibuprofen  (ADVIL,MOTRIN) 200 MG tablet Take 200 mg by mouth every 6 (six) hours as needed. 10/23/2016: Uses prn hip pain.  Took 2 this morning  . Naproxen Sodium (ALEVE PO) Take 1 tablet by mouth 2 (two) times daily. 07/31/2016: Uses prn headaches, about 1x/week  . triamcinolone cream (KENALOG) 0.1 % Apply 1 application topically 2 (two) times daily. (Patient not taking: Reported on 01/23/2017)   . [EXPIRED] lidocaine (XYLOCAINE) 2 % (with pres) injection 30 mg    . [EXPIRED] lidocaine (XYLOCAINE) 2 % (with pres) injection 30 mg    . [EXPIRED] triamcinolone acetonide (KENALOG-40) injection 20 mg    . [EXPIRED] triamcinolone acetonide (KENALOG-40) injection 20 mg     No facility-administered encounter medications on file as of 01/23/2017.    Allergies  Allergen Reactions  . Ciprofloxacin     After she takes for a while it gives her a "funny feeling."  . Codeine Nausea Only  . Iodine Swelling  . Polysporin [Bacitracin-Polymyxin B] Swelling    Used for eye infection--got redder and worse  . Sulfa Antibiotics Swelling    Lips swell, itching  . Penicillins Rash   ROS: no fever, chills, URI symptoms, numbness, tingling, weakness, urinary complaints, chest pain, shortness of breath, bleeding, bruising, GI concerns or other complaints  PHYSICAL EXAM:  BP 138/72 (BP Location: Left  Arm, Patient Position: Sitting, Cuff Size: Normal)   Pulse 68   Ht 5' (1.524 m)   Wt 179 lb 12.8 oz (81.6 kg)   BMI 35.11 kg/m   Well developed, pleasant female in no distress Lower extremities: focal tenderness over trochanteric bursa bilaterally. No pain with IT band stretch.  nontender elsewhere at hips, full range of motion.  Verbal consent obtained--aware of risks and alternatives, wishes to proceed.  Procedure: Area was cleansed with alcohol. Ethyl chloride spray was applied, then trochanteric bursa was injected with 89m of kenalog, and 1.5 cc of 2% lidocaine without epi.  The same procedure was done  bilaterally. She tolerated both without any complication, and had some immediate improvement in pain.  ASSESSMENT/PLAN:  Trochanteric bursitis of both hips - Plan: PR DRAIN/INJECT LARGE JOINT/BURSA  Pain of both hip joints - Plan: lidocaine (XYLOCAINE) 2 % (with pres) injection 30 mg, lidocaine (XYLOCAINE) 2 % (with pres) injection 30 mg, triamcinolone acetonide (KENALOG-40) injection 20 mg, triamcinolone acetonide (KENALOG-40) injection 20 mg   Sign release--ortho visit and MRI

## 2017-01-23 NOTE — Patient Instructions (Signed)
Hip Bursitis Hip bursitis is inflammation of a fluid-filled sac (bursa) in the hip joint. The bursa protects the bones in the hip joint from rubbing against each other. Hip bursitis can cause mild to moderate pain, and symptoms often come and go over time. What are the causes? This condition may be caused by:  Injury to the hip.  Overuse of the muscles that surround the hip joint.  Arthritis or gout.  Diabetes.  Thyroid disease.  Cold weather.  Infection.  In some cases, the cause may not be known. What are the signs or symptoms? Symptoms of this condition may include:  Mild or moderate pain in the hip area. Pain may get worse with movement.  Tenderness and swelling of the hip, especially on the outer side of the hip.  Symptoms may come and go. If the bursa becomes infected, you may have the following symptoms:  Fever.  Red skin and a feeling of warmth in the hip area.  How is this diagnosed? This condition may be diagnosed based on:  A physical exam.  Your medical history.  X-rays.  Removal of fluid from your inflamed bursa for testing (biopsy).  You may be sent to a health care provider who specializes in bone diseases (orthopedist) or a provider who specializes in joint inflammation (rheumatologist). How is this treated? This condition is treated by resting, raising (elevating), and applying pressure(compression) to the injured area. In some cases, this may be enough to make your symptoms go away. Treatment may also include:  Crutches.  Antibiotic medicine.  Draining fluid out of the bursa to help relieve swelling.  Injecting medicine that helps to reduce inflammation (cortisone).  Follow these instructions at home: Medicines  Take over-the-counter and prescription medicines only as told by your health care provider.  Do not drive or operate heavy machinery while taking prescription pain medicine, or as told by your health care provider.  If you were  prescribed an antibiotic, take it as told by your health care provider. Do not stop taking the antibiotic even if you start to feel better. Activity  Return to your normal activities as told by your health care provider. Ask your health care provider what activities are safe for you.  Rest and protect your hip as much as possible until your pain and swelling get better. General instructions  Wear compression wraps only as told by your health care provider.  Elevate your hip above the level of your heart as much as you can without pain. To do this, try putting a pillow under your hips while you lie down.  Do not use your hip to support your body weight until your health care provider says that you can. Use crutches as told by your health care provider.  Gently massage and stretch your injured area as often as is comfortable.  Keep all follow-up visits as told by your health care provider. This is important. How is this prevented?  Exercise regularly, as told by your health care provider.  Warm up and stretch before being active.  Cool down and stretch after being active.  If an activity irritates your hip or causes pain, avoid the activity as much as possible.  Avoid sitting down for long periods at a time. Contact a health care provider if:  You have a fever.  You develop new symptoms.  You have difficulty walking or doing everyday activities.  You have pain that gets worse or does not get better with medicine.  You  develop red skin or a feeling of warmth in your hip area. Get help right away if:  You cannot move your hip.  You have severe pain. This information is not intended to replace advice given to you by your health care provider. Make sure you discuss any questions you have with your health care provider. Document Released: 01/25/2002 Document Revised: 01/11/2016 Document Reviewed: 03/07/2015 Elsevier Interactive Patient Education  [employer].

## 2017-01-28 DIAGNOSIS — M5136 Other intervertebral disc degeneration, lumbar region: Secondary | ICD-10-CM | POA: Diagnosis not present

## 2017-01-28 DIAGNOSIS — M5442 Lumbago with sciatica, left side: Secondary | ICD-10-CM | POA: Diagnosis not present

## 2017-01-28 DIAGNOSIS — M546 Pain in thoracic spine: Secondary | ICD-10-CM | POA: Diagnosis not present

## 2017-01-28 DIAGNOSIS — G8929 Other chronic pain: Secondary | ICD-10-CM | POA: Diagnosis not present

## 2017-01-28 DIAGNOSIS — M5441 Lumbago with sciatica, right side: Secondary | ICD-10-CM | POA: Diagnosis not present

## 2017-01-28 DIAGNOSIS — M549 Dorsalgia, unspecified: Secondary | ICD-10-CM | POA: Diagnosis not present

## 2017-01-28 DIAGNOSIS — M47816 Spondylosis without myelopathy or radiculopathy, lumbar region: Secondary | ICD-10-CM | POA: Diagnosis not present

## 2017-01-28 DIAGNOSIS — M48062 Spinal stenosis, lumbar region with neurogenic claudication: Secondary | ICD-10-CM | POA: Diagnosis not present

## 2017-01-30 ENCOUNTER — Other Ambulatory Visit: Payer: Self-pay | Admitting: Obstetrics & Gynecology

## 2017-01-30 DIAGNOSIS — Z1231 Encounter for screening mammogram for malignant neoplasm of breast: Secondary | ICD-10-CM

## 2017-01-31 ENCOUNTER — Telehealth: Payer: Self-pay

## 2017-01-31 NOTE — Telephone Encounter (Signed)
Records placed in folder for review, for [employer].  Victorino December

## 2017-02-05 ENCOUNTER — Encounter: Payer: Self-pay | Admitting: Family Medicine

## 2017-02-10 ENCOUNTER — Encounter: Payer: Self-pay | Admitting: Family Medicine

## 2017-02-16 DIAGNOSIS — Z853 Personal history of malignant neoplasm of breast: Secondary | ICD-10-CM

## 2017-02-16 HISTORY — DX: Personal history of malignant neoplasm of breast: Z85.3

## 2017-02-21 ENCOUNTER — Ambulatory Visit
Admission: RE | Admit: 2017-02-21 | Discharge: 2017-02-21 | Disposition: A | Discharge status: Home / Self Care | Payer: Medicare Other | Source: Ambulatory Visit | Attending: Obstetrics & Gynecology | Admitting: Obstetrics & Gynecology

## 2017-02-21 DIAGNOSIS — Z1231 Encounter for screening mammogram for malignant neoplasm of breast: Secondary | ICD-10-CM | POA: Diagnosis not present

## 2017-02-24 ENCOUNTER — Other Ambulatory Visit: Payer: Self-pay | Admitting: Obstetrics & Gynecology

## 2017-02-24 DIAGNOSIS — Z124 Encounter for screening for malignant neoplasm of cervix: Secondary | ICD-10-CM | POA: Diagnosis not present

## 2017-02-24 DIAGNOSIS — N63 Unspecified lump in unspecified breast: Secondary | ICD-10-CM

## 2017-02-24 DIAGNOSIS — Z6828 Body mass index (BMI) 28.0-28.9, adult: Secondary | ICD-10-CM | POA: Diagnosis not present

## 2017-02-25 ENCOUNTER — Encounter: Payer: Self-pay | Admitting: Family Medicine

## 2017-02-27 ENCOUNTER — Ambulatory Visit
Admission: RE | Admit: 2017-02-27 | Discharge: 2017-02-27 | Disposition: A | Discharge status: Home / Self Care | Payer: Medicare Other | Source: Ambulatory Visit | Attending: Obstetrics & Gynecology | Admitting: Obstetrics & Gynecology

## 2017-02-27 ENCOUNTER — Other Ambulatory Visit: Payer: Self-pay | Admitting: Obstetrics & Gynecology

## 2017-02-27 DIAGNOSIS — N63 Unspecified lump in unspecified breast: Secondary | ICD-10-CM

## 2017-02-27 DIAGNOSIS — R928 Other abnormal and inconclusive findings on diagnostic imaging of breast: Secondary | ICD-10-CM | POA: Diagnosis not present

## 2017-02-27 DIAGNOSIS — N6310 Unspecified lump in the right breast, unspecified quadrant: Secondary | ICD-10-CM

## 2017-02-27 DIAGNOSIS — N6489 Other specified disorders of breast: Secondary | ICD-10-CM | POA: Diagnosis not present

## 2017-03-03 ENCOUNTER — Ambulatory Visit
Admission: RE | Admit: 2017-03-03 | Discharge: 2017-03-03 | Disposition: A | Discharge status: Home / Self Care | Payer: Medicare Other | Source: Ambulatory Visit | Attending: Obstetrics & Gynecology | Admitting: Obstetrics & Gynecology

## 2017-03-03 ENCOUNTER — Other Ambulatory Visit: Payer: Self-pay | Admitting: Obstetrics & Gynecology

## 2017-03-03 DIAGNOSIS — R928 Other abnormal and inconclusive findings on diagnostic imaging of breast: Secondary | ICD-10-CM

## 2017-03-03 DIAGNOSIS — C50211 Malignant neoplasm of upper-inner quadrant of right female breast: Secondary | ICD-10-CM | POA: Diagnosis not present

## 2017-03-03 DIAGNOSIS — N6310 Unspecified lump in the right breast, unspecified quadrant: Secondary | ICD-10-CM

## 2017-03-03 DIAGNOSIS — N6312 Unspecified lump in the right breast, upper inner quadrant: Secondary | ICD-10-CM | POA: Diagnosis not present

## 2017-03-06 ENCOUNTER — Telehealth: Payer: Self-pay | Admitting: *Deleted

## 2017-03-06 NOTE — Telephone Encounter (Signed)
Confirmed [facility] for 03/12/17 at 1215 .  Instructions and contact information given.

## 2017-03-11 ENCOUNTER — Ambulatory Visit: Payer: Medicare Other | Admitting: Radiation Oncology

## 2017-03-11 ENCOUNTER — Other Ambulatory Visit: Payer: Self-pay | Admitting: *Deleted

## 2017-03-11 ENCOUNTER — Encounter: Payer: Self-pay | Admitting: *Deleted

## 2017-03-11 DIAGNOSIS — C50211 Malignant neoplasm of upper-inner quadrant of right female breast: Secondary | ICD-10-CM | POA: Insufficient documentation

## 2017-03-11 DIAGNOSIS — Z170 Estrogen receptor positive status [ER+]: Secondary | ICD-10-CM

## 2017-03-12 ENCOUNTER — Encounter: Payer: Self-pay | Admitting: Oncology

## 2017-03-12 ENCOUNTER — Ambulatory Visit: Payer: Medicare Other | Attending: General Surgery | Admitting: Physical Therapy

## 2017-03-12 ENCOUNTER — Ambulatory Visit (HOSPITAL_BASED_OUTPATIENT_CLINIC_OR_DEPARTMENT_OTHER): Payer: Medicare Other | Admitting: Oncology

## 2017-03-12 ENCOUNTER — Ambulatory Visit: Payer: Self-pay | Admitting: General Surgery

## 2017-03-12 ENCOUNTER — Encounter: Payer: Self-pay | Admitting: Physical Therapy

## 2017-03-12 ENCOUNTER — Other Ambulatory Visit (HOSPITAL_BASED_OUTPATIENT_CLINIC_OR_DEPARTMENT_OTHER): Payer: Medicare Other

## 2017-03-12 ENCOUNTER — Ambulatory Visit
Admission: RE | Admit: 2017-03-12 | Discharge: 2017-03-12 | Disposition: A | Discharge status: Home / Self Care | Payer: Medicare Other | Source: Ambulatory Visit | Attending: Radiation Oncology | Admitting: Radiation Oncology

## 2017-03-12 VITALS — BP 194/110 | HR 66 | Temp 98.4°F | Resp 18 | Ht 60.0 in | Wt 174.3 lb

## 2017-03-12 DIAGNOSIS — C50211 Malignant neoplasm of upper-inner quadrant of right female breast: Secondary | ICD-10-CM

## 2017-03-12 DIAGNOSIS — Z170 Estrogen receptor positive status [ER+]: Secondary | ICD-10-CM | POA: Insufficient documentation

## 2017-03-12 DIAGNOSIS — R293 Abnormal posture: Secondary | ICD-10-CM | POA: Diagnosis not present

## 2017-03-12 DIAGNOSIS — R030 Elevated blood-pressure reading, without diagnosis of hypertension: Secondary | ICD-10-CM | POA: Diagnosis not present

## 2017-03-12 DIAGNOSIS — C50111 Malignant neoplasm of central portion of right female breast: Secondary | ICD-10-CM | POA: Diagnosis not present

## 2017-03-12 LAB — CBC WITH DIFFERENTIAL/PLATELET
BASO%: 1.1 % (ref 0.0–2.0)
BASOS ABS: 0.1 10*3/uL (ref 0.0–0.1)
EOS ABS: 0.1 10*3/uL (ref 0.0–0.5)
EOS%: 1.2 % (ref 0.0–7.0)
HEMATOCRIT: 42.1 % (ref 34.8–46.6)
HEMOGLOBIN: 14 g/dL (ref 11.6–15.9)
LYMPH#: 1.6 10*3/uL (ref 0.9–3.3)
LYMPH%: 33.5 % (ref 14.0–49.7)
MCH: 31.8 pg (ref 25.1–34.0)
MCHC: 33.3 g/dL (ref 31.5–36.0)
MCV: 95.5 fL (ref 79.5–101.0)
MONO#: 0.4 10*3/uL (ref 0.1–0.9)
MONO%: 8.6 % (ref 0.0–14.0)
NEUT#: 2.7 10*3/uL (ref 1.5–6.5)
NEUT%: 55.6 % (ref 38.4–76.8)
Platelets: 209 10*3/uL (ref 145–400)
RBC: 4.41 10*6/uL (ref 3.70–5.45)
RDW: 13.6 % (ref 11.2–14.5)
WBC: 4.9 10*3/uL (ref 3.9–10.3)

## 2017-03-12 LAB — COMPREHENSIVE METABOLIC PANEL
ALBUMIN: 3.9 g/dL (ref 3.5–5.0)
ALK PHOS: 58 U/L (ref 40–150)
ALT: 31 U/L (ref 0–55)
AST: 23 U/L (ref 5–34)
Anion Gap: 9 mEq/L (ref 3–11)
BUN: 14.1 mg/dL (ref 7.0–26.0)
CALCIUM: 9.5 mg/dL (ref 8.4–10.4)
CO2: 27 mEq/L (ref 22–29)
Chloride: 106 mEq/L (ref 98–109)
Creatinine: 0.9 mg/dL (ref 0.6–1.1)
EGFR: 64 mL/min/{1.73_m2} — AB (ref >90)
Glucose: 100 mg/dl (ref 70–140)
POTASSIUM: 4 meq/L (ref 3.5–5.1)
Sodium: 142 mEq/L (ref 136–145)
Total Bilirubin: 0.49 mg/dL (ref 0.20–1.20)
Total Protein: 7.5 g/dL (ref 6.4–8.3)

## 2017-03-12 MED ORDER — ANASTROZOLE 1 MG PO TABS: 1.0000 mg | ORAL_TABLET | Freq: Every day | ORAL | 4 refills | Status: DC

## 2017-03-12 NOTE — Progress Notes (Signed)
Mckenzie Webb  Telephone:([phone] Fax:([phone]) [phone]     ID: Mckenzie Webb DOB: 1946-08-10  MR#: [id number]  QQV#:[id number]  Patient Care Team: Rita Ohara, MD as PCP - General (Family Medicine) Jovita Kussmaul, MD as Consulting Physician (General Surgery) Laiah Pouncey, Virgie Dad, MD as Consulting Physician (Oncology) Eppie Gibson, MD as Attending Physician (Radiation Oncology) Chauncey Cruel, MD OTHER MD:  CHIEF COMPLAINT: Estrogen receptor positive breast cancer  CURRENT TREATMENT: Anastrozole, awaiting definitive surgery   BREAST CANCER HISTORY: "Mckenzie Webb" had bilateral screening mammography with tomography at the [facility] 02/21/2017. This showed a possible mass in the right breast. Right diagnostic mammography with tomography and ultrasonography 02/27/2017 found a breast density to be category B. In the subareolar right breast there was a 0.5 cm spiculated mass, which was not palpable. There was mild right nipple inversion, which per report was chronic. Ultrasonography confirmed a 0.5 cm retroareolar breast mass at the 1:00 radiant. The right axilla was sonographically benign.  On 03/03/2017 the patient underwent biopsy of the right breast mass in question. This showed ([phone]) and invasive ductal carcinoma, grade 1, estrogen receptor 100% positive, progesterone receptor negative, with an MIB-1 of 3%, and no HER-2 implication, the signals ratio being 1.25 and the number per cell 2.00.  The patient's subsequent history is as detailed below.  INTERVAL HISTORY: Mckenzie Webb was evaluated in the multidisciplinary breast cancer conference 03/12/2017 accompanied by her husband Mckenzie Webb and her son Mckenzie Webb. Her case was also presented in the multidisciplinary breast cancer conference that same morning. At that time a preliminary plan was proposed: Genetics testing, breast conserving surgery, with possible central lumpectomy, with sentinel lymph node sampling; likely no  chemotherapy; adjuvant radiation followed by hormones.  REVIEW OF SYSTEMS: There were no specific symptoms leading to the original mammogram, which was routinely scheduled. The patient denies unusual headaches, visual changes, nausea, vomiting, stiff neck, dizziness, or gait imbalance. There has been no cough, phlegm production, or pleurisy, no chest pain or pressure, and no change in bowel or bladder habits. The patient denies fever, rash, bleeding, unexplained fatigue or unexplained weight loss. A detailed review of systems was otherwise entirely negative.   PAST MEDICAL HISTORY: Past Medical History:  Diagnosis Date  . Allergy   . Diabetes mellitus 4/07   pt reports that all subsequent tests were normal, not diabetic  . DJD (degenerative joint disease), cervical 04 2002  . Edema    resolved  . GERD (gastroesophageal reflux disease)   . Hyperlipidemia   . Hypothyroid   . Interstitial cystitis   . Leukopenia   . Obesity   . Prediabetes 06/2014   fasting glucose 109; A1c 5.7  . Psoriasis     PAST SURGICAL HISTORY: Past Surgical History:  Procedure Laterality Date  . CHOLECYSTECTOMY    . COLONOSCOPY  3/09  . KNEE ARTHROSCOPY Right 1990  . KNEE ARTHROSCOPY Left 12/12/2010    (Dr. Noemi Chapel)  . TUBAL LIGATION  1983  . VARICOSE VEIN SURGERY  09/2009 R, 06/2010 L    FAMILY HISTORY Family History  Problem Relation Age of Onset  . Hypertension Mother   . Heart disease Mother   . Gallbladder disease Mother   . Thyroid disease Mother   . Arthritis Mother   . Diabetes Father   . Cancer Father        pancreatic  . Hypertension Sister   . Gallbladder disease Sister   . Diabetes Sister   . Diverticulitis Sister  of small intestine  . Cancer Son        testicular cancer  . Stroke Sister        late 41's  . Hypertension Sister   . Pulmonary embolism Sister   . Ulcerative colitis Sister   . COPD Brother        had lung lobe removed for suspected cancer, but was benign;  smoker  . Breast cancer Neg Hx   . Colon cancer Neg Hx   The patient's father died from pancreatic cancer at the age of 42. The patient's mother died from a myocardial infarction at age 64. The patient had one brother, 3 sisters. The patient's older son was diagnosed with testicular cancer at age 23. A maternal niece was diagnosed with colon cancer at age 96 and a maternal cousin with lung cancer at an unknown age.  GYNECOLOGIC HISTORY:  No LMP recorded. Patient is postmenopausal.  Menarche age 61, first live birth age 63, the patient is [place] P2. she went through the change of life approximately the year 2000. She did not take hormone replacement. She did take oral contraceptives for more than 20 years remotely, with no complications.   SOCIAL HISTORY:  She is a retired Web designer. Her husband Mckenzie Webb is an Clinical biochemist, still working part-time. Son Mckenzie Webb (from the patient's first marriage) lives in [place] and works in Therapist, art. Son Mckenzie Webb (from patient second marriage) lives in [place] we are is Comptroller. The patient has one grandchild. She is not a Ambulance person.    ADVANCED DIRECTIVES: Not in place   HEALTH MAINTENANCE: Social History  Substance Use Topics  . Smoking status: Never Smoker  . Smokeless tobacco: Never Used  . Alcohol use Yes     Comment: social, 1-2 times per year     Colonoscopy:2009/[facility] and  PAP:  [facility] T score -0.1 (normal)   Allergies  Allergen Reactions  . Ciprofloxacin     After she takes for a while it gives her a "funny feeling."  . Codeine Nausea Only  . Iodine Swelling  . Polysporin [Bacitracin-Polymyxin B] Swelling    Used for eye infection--got redder and worse  . Sulfa Antibiotics Swelling    Lips swell, itching  . Penicillins Rash    Current Outpatient Prescriptions  Medication Sig Dispense Refill  . Calcium-Vitamin D (CALTRATE 600 PLUS-VIT D PO) Take 1 tablet by mouth daily.       . Cholecalciferol (VITAMIN D) 1000 UNITS capsule Take 1,000 Units by mouth daily.      Marland Kitchen co-enzyme Q-10 30 MG capsule Take 100 mg by mouth daily.      . Cyanocobalamin (VITAMIN B 12 PO) Take 1 tablet by mouth.      . fish oil-omega-3 fatty acids 1000 MG capsule Take 2 g by mouth daily.     Marland Kitchen ibuprofen (ADVIL,MOTRIN) 200 MG tablet Take 200 mg by mouth every 6 (six) hours as needed.    Marland Kitchen levothyroxine (SYNTHROID, LEVOTHROID) 88 MCG tablet Take 1 tablet (88 mcg total) by mouth daily. 90 tablet 3  . losartan-hydrochlorothiazide (HYZAAR) 50-12.5 MG tablet TAKE ONE (1) TABLET BY MOUTH EVERY DAY 90 tablet 3  . Multiple Vitamins-Minerals (ALIVE WOMENS 50+ PO) Take by mouth.    . Naproxen Sodium (ALEVE PO) Take 1 tablet by mouth 2 (two) times daily.    Marland Kitchen omeprazole (PRILOSEC) 40 MG capsule TAKE ONE (1) CAPSULE EACH DAY 90 capsule 2  . Probiotic  Product (PROBIOTIC DAILY PO) Take 1 tablet by mouth daily.    . Red Yeast Rice Extract (RED YEAST RICE PO) Take 4 tablets by mouth daily.      Marland Kitchen triamcinolone cream (KENALOG) 0.1 % Apply 1 application topically 2 (two) times daily. (Patient not taking: Reported on 01/23/2017) 45 g 2   No current facility-administered medications for this visit.     OBJECTIVE: Middle-aged white woman in no acute distress  Vitals:   03/12/17 1305  BP: (!) 194/110  Pulse: 66  Resp: 18  Temp: 98.4 F (36.9 C)     Body mass index is 34.04 kg/m.   Wt Readings from Last 3 Encounters:  03/12/17 174 lb 4.8 oz (79.1 kg)  01/23/17 179 lb 12.8 oz (81.6 kg)  10/30/16 179 lb 6.4 oz (81.4 kg)      ECOG FS:0 - Asymptomatic  Ocular: Sclerae unicteric, pupils round and equal Ear-nose-throat: Oropharynx clear and moist Lymphatic: No cervical or supraclavicular adenopathy Lungs no rales or rhonchi Heart regular rate and rhythm Abd soft, nontender, positive bowel sounds MSK no focal spinal tenderness, no joint edema Neuro: non-focal, well-oriented, appropriate  affect Breasts: The right breast is status post recent biopsy. There is a significant ecchymosis. There is no palpable mass. Left breast is benign. Both axillae are benign.   LAB RESULTS:  CMP     Component Value Date/Time   NA 142 03/12/2017 1225   K 4.0 03/12/2017 1225   CL 100 07/31/2016 0845   CO2 27 03/12/2017 1225   GLUCOSE 100 03/12/2017 1225   BUN 14.1 03/12/2017 1225   CREATININE 0.9 03/12/2017 1225   CALCIUM 9.5 03/12/2017 1225   PROT 7.5 03/12/2017 1225   ALBUMIN 3.9 03/12/2017 1225   AST 23 03/12/2017 1225   ALT 31 03/12/2017 1225   ALKPHOS 58 03/12/2017 1225   BILITOT 0.49 03/12/2017 1225    No results found for: TOTALPROTELP, ALBUMINELP, A1GS, A2GS, BETS, BETA2SER, GAMS, MSPIKE, SPEI  No results found for: Nils Pyle, [facility]  Lab Results  Component Value Date   WBC 4.9 03/12/2017   NEUTROABS 2.7 03/12/2017   HGB 14.0 03/12/2017   HCT 42.1 03/12/2017   MCV 95.5 03/12/2017   PLT 209 03/12/2017      Chemistry      Component Value Date/Time   NA 142 03/12/2017 1225   K 4.0 03/12/2017 1225   CL 100 07/31/2016 0845   CO2 27 03/12/2017 1225   BUN 14.1 03/12/2017 1225   CREATININE 0.9 03/12/2017 1225      Component Value Date/Time   CALCIUM 9.5 03/12/2017 1225   ALKPHOS 58 03/12/2017 1225   AST 23 03/12/2017 1225   ALT 31 03/12/2017 1225   BILITOT 0.49 03/12/2017 1225       No results found for: LABCA2  No components found for: [id number]  No results for input(s): INR in the last 168 hours.  Urinalysis    Component Value Date/Time   BILIRUBINUR neg 08/17/2014 1123   PROTEINUR neg 08/17/2014 1123   UROBILINOGEN negative 08/17/2014 1123   NITRITE neg 08/17/2014 1123   LEUKOCYTESUR small (1+) 08/17/2014 1123     STUDIES: US Breast Ltd Uni Right Inc Axilla  Result Date: 02/27/2017 CLINICAL DATA:  Right subareolar breast possible mass seen on most recent screening mammography. EXAM: 2D DIGITAL DIAGNOSTIC RIGHT MAMMOGRAM  WITH CAD AND ADJUNCT TOMO ULTRASOUND RIGHT BREAST COMPARISON:  Previous exam(s). ACR Breast Density Category b: There are scattered areas of fibroglandular density.  FINDINGS: Additional mammographic views of the right breast demonstrate persistent spiculated mass in the subareolar right breast slightly upper inner quadrant, anterior depth. Abnormality measures 5 mm mammographically. Mammographic images were processed with CAD. On physical exam, no suspicious masses are palpated. There is right nipple inversion, which has been chronic per patient's report. Targeted ultrasound is performed, showing right breast 1 o'clock retroareolar breast 0.5 by 0.4 by 0.4 cm slightly spiculated solid nodule with increased internal vascularity. There is no sonographic evidence of right axillary lymphadenopathy. IMPRESSION: Right breast 1 o'clock 5 mm suspicious nodule, for which ultrasound-guided core needle biopsy is recommended. RECOMMENDATION: Ultrasound-guided core needle biopsy of the right breast. I have discussed the findings and recommendations with the patient. Results were also provided in writing at the conclusion of the visit. If applicable, a reminder letter will be sent to the patient regarding the next appointment. BI-RADS CATEGORY  4: Suspicious. Electronically Signed   By: Fidela Salisbury M.D.   On: 02/27/2017 12:23   Mm Diag Breast Tomo Uni Right  Result Date: 02/27/2017 CLINICAL DATA:  Right subareolar breast possible mass seen on most recent screening mammography. EXAM: 2D DIGITAL DIAGNOSTIC RIGHT MAMMOGRAM WITH CAD AND ADJUNCT TOMO ULTRASOUND RIGHT BREAST COMPARISON:  Previous exam(s). ACR Breast Density Category b: There are scattered areas of fibroglandular density. FINDINGS: Additional mammographic views of the right breast demonstrate persistent spiculated mass in the subareolar right breast slightly upper inner quadrant, anterior depth. Abnormality measures 5 mm mammographically. Mammographic images  were processed with CAD. On physical exam, no suspicious masses are palpated. There is right nipple inversion, which has been chronic per patient's report. Targeted ultrasound is performed, showing right breast 1 o'clock retroareolar breast 0.5 by 0.4 by 0.4 cm slightly spiculated solid nodule with increased internal vascularity. There is no sonographic evidence of right axillary lymphadenopathy. IMPRESSION: Right breast 1 o'clock 5 mm suspicious nodule, for which ultrasound-guided core needle biopsy is recommended. RECOMMENDATION: Ultrasound-guided core needle biopsy of the right breast. I have discussed the findings and recommendations with the patient. Results were also provided in writing at the conclusion of the visit. If applicable, a reminder letter will be sent to the patient regarding the next appointment. BI-RADS CATEGORY  4: Suspicious. Electronically Signed   By: Fidela Salisbury M.D.   On: 02/27/2017 12:23   Mm Screening Breast Tomo Bilateral  Result Date: 02/24/2017 CLINICAL DATA:  Screening. EXAM: 2D DIGITAL SCREENING BILATERAL MAMMOGRAM WITH CAD AND ADJUNCT TOMO COMPARISON:  Previous exam(s). ACR Breast Density Category b: There are scattered areas of fibroglandular density. FINDINGS: In the right breast, a possible mass warrants further evaluation. In the left breast, no findings suspicious for malignancy. Images were processed with CAD. IMPRESSION: Further evaluation is suggested for possible mass in the right breast. RECOMMENDATION: Diagnostic mammogram and possibly ultrasound of the right breast. (Code:[id number]) The patient will be contacted regarding the findings, and additional imaging will be scheduled. BI-RADS CATEGORY  0: Incomplete. Need additional imaging evaluation and/or prior mammograms for comparison. Electronically Signed   By: Pamelia Hoit M.D.   On: 02/24/2017 08:04   Mm Clip Placement Right  Result Date: 03/03/2017 CLINICAL DATA:  Status post ultrasound-guided core biopsy  of right breast mass. EXAM: DIAGNOSTIC RIGHT MAMMOGRAM POST ULTRASOUND BIOPSY COMPARISON:  Previous exam(s). FINDINGS: Mammographic images were obtained following ultrasound guided biopsy of a mass in the 1 o'clock subareolar region of the right breast. Mammographic images showed post biopsy changes in the right breast. There is a ribbon shaped  clip in appropriate position in the subareolar 1 o'clock region of the right breast. IMPRESSION: Status post ultrasound-guided core biopsy of the right breast with pathology pending. Final Assessment: Post Procedure Mammograms for Marker Placement Electronically Signed   By: Lillia Mountain M.D.   On: 03/03/2017 15:39   [place] Rt Breast Bx W Loc Dev 1st Lesion Img Bx Spec US Guide  Addendum Date: 03/04/2017   ADDENDUM REPORT: 03/04/2017 13:51 ADDENDUM: Pathology revealed GRADE I-II INVASIVE DUCTAL CARCINOMA, PERINEURAL INVASION IS IDENTIFIED of the Right breast, 1:00 o'clock. This was found to be concordant by Dr. Lillia Mountain. Pathology results were discussed with the patient by telephone. The patient reported doing well after the biopsy with tenderness and bruising at the site. Post biopsy instructions and care were reviewed and questions were answered. The patient was encouraged to call [facility] for any additional concerns. The patient was referred to [facility] on March 12, 2017. Pathology results reported by Terie Purser, RN on 03/04/2017. Electronically Signed   By: Lillia Mountain M.D.   On: 03/04/2017 13:51   Result Date: 03/04/2017 CLINICAL DATA:  Right breast mass. EXAM: ULTRASOUND GUIDED RIGHT BREAST CORE NEEDLE BIOPSY COMPARISON:  Previous exam(s). FINDINGS: I met with the patient and we discussed the procedure of ultrasound-guided biopsy, including benefits and alternatives. We discussed the high likelihood of a successful procedure. We discussed the risks of the  procedure, including infection, bleeding, tissue injury, clip migration, and inadequate sampling. Informed written consent was given. The usual time-out protocol was performed immediately prior to the procedure. Lesion quadrant: Right upper inner quadrant Using sterile technique and 1% Lidocaine as local anesthetic, under direct ultrasound visualization, a 14 gauge spring-loaded device was used to perform biopsy of a mass in the 1 o'clock region of the right breast using an inferior to superior approach. At the conclusion of the procedure a ribbon shaped tissue marker clip was deployed into the biopsy cavity. Follow up 2 view mammogram was performed and dictated separately. IMPRESSION: Ultrasound guided biopsy of the right breast. No apparent complications. Electronically Signed: By: Lillia Mountain M.D. On: 03/03/2017 15:36    ELIGIBLE FOR AVAILABLE RESEARCH PROTOCOL: no  ASSESSMENT: 71 y.o. [place] woman status post right breast upper inner quadrant biopsy 03/04/2015 for a clinical T1a N0, stage IA invasive ductal carcinoma, grade 1, estrogen receptor positive, progesterone receptor negative, with an MIB-1 of 3% and no HER-2 amplification  (1) anastrozole started neoadjuvantly 03/12/2017 in anticipation of possible surgical delays   (2) genetics testing 03/17/2017  (3) breast conserving surgery with sentinel lymph node sampling pending  (4) adjuvant radiation  (5) continue anti-estrogens to a total of 5 years.    PLAN: We spent the better part of today's hour-long appointment discussing the biology of breast cancer in general, and the specifics of the patient's tumor in particular.We first reviewed the fact that cancer is not one disease but more than 100 different diseases and that it is important to keep them separate-- otherwise when friends and relatives discuss their own cancer experiences with Baker Janus confusion can result. Similarly we explained that if breast cancer spreads to the bone or  liver, the patient would not have bone cancer or liver cancer, but breast cancer in the bone and breast cancer in the liver: one cancer in three places-- not 3 different cancers which otherwise would have to be treated in 3 different ways.  We  discussed the difference between local and systemic therapy. In terms of loco-regional treatment, lumpectomy plus radiation is equivalent to mastectomy as far as survival is concerned. For this reason, and because the cosmetic results are generally superior, we recommend breast conserving surgery. We also noted that in terms of sequencing of treatments, whether systemic therapy or surgery is done first does not affect the ultimate outcome this is relevant to her own situation.  We then discussed the rationale for systemic therapy. There is some risk that this cancer may have already spread to other parts of her body. Patients frequently ask at this point about bone scans, CAT scans and PET scans to find out if they have occult breast cancer somewhere else. The problem is that in early stage disease we are much more likely to find false positives then true cancers and this would expose the patient to unnecessary procedures as well as unnecessary radiation. Scans cannot answer the question the patient really would like to know, which is whether she has microscopic disease elsewhere in her body. For those reasons we do not recommend them.  Of course we would proceed to aggressive evaluation of any symptoms that might suggest metastatic disease, but that is not the case here.  Next we went over the options for systemic therapy which are anti-estrogens, anti-HER-2 immunotherapy, and chemotherapy. Baker Janus does not meet criteria for anti-HER-2 immunotherapy. She is a good candidate for anti-estrogens.  The question of chemotherapy is more complicated. Chemotherapy is most effective in rapidly growing, aggressive tumors. It is much less effective in low-grade, slow growing  cancers, like Baker Janus 's. Accordingly we do not anticipate that chemotherapy will be useful or needed as we expect her to have a very good prognosis even without that form of systemic treatment. We would only consider an Oncotype if there is a significant difference between the pathology out, after surgery and the clinical impression at present  We then discussed genetics. In patients who carry a deleterious mutation [for example in a  BRCA gene], the risk of a new breast cancer developing in the future may be sufficiently great that the patient may choose bilateral mastectomies. However if she wishes to keep her breasts in that situation it is safe to do so. That would require intensified screening, which generally means not only yearly mammography but a yearly breast MRI as well. Of course, if there is a deleterious mutation bilateral oophorectomy would be necessary as there is no standard screening protocol for ovarian cancer.  Today Baker Janus could not decide whether or not she would want bilateral mastectomies in case she carries a deleterious mutation. Accordingly it is prudent to wait on her definitive surgery until the genetics results RN. Because that may involve delays (particularly if she does choose bilateral mastectomies and reconstruction) we discussed starting anti-estrogens at present. We specifically discussed anastrozole, and she is aware of the possible toxicities side effects and complications of this agent.  I went ahead and placed the prescription in for her. She will start anastrozole now. She may continue it right through her surgery and radiation as appropriate so long as she is tolerating it well.   I expect to see her sometime after she completes her radiation treatments. At that time we can either change to a different antiestrogen or continue the current one if she is tolerating it well  Baker Janus has a good understanding of the overall plan. She agrees with it. She knows the goal of  treatment in her case  is cure. She will call with any problems that may develop before her next visit here.  Chauncey Cruel, MD   03/12/2017 2:01 PM Medical Oncology and Hematology [facility] [address] [place], [place] [address] Tel. [phone]    Fax. [phone]

## 2017-03-12 NOTE — Patient Instructions (Signed)

## 2017-03-12 NOTE — Progress Notes (Addendum)
Radiation Oncology         [phone] ________________________________  Initial outpatient Consultation  Name: Mckenzie Webb MRN: [id number]  Date: 03/12/2017  DOB: [birth year]  TW:SFKCL, Eve, MD  Jovita Kussmaul, MD   REFERRING PHYSICIAN: Autumn Messing III, MD  DIAGNOSIS:    ICD-10-CM   1. Malignant neoplasm of upper-inner quadrant of right breast in female, estrogen receptor positive ([place]) C50.211    Z17.0     Stage I [id number] Right Breast Invasive Ductal Carcinoma, ER+/ PR+/ Her2 negative, Grade 1  CHIEF COMPLAINT: Here to discuss management of right breast cancer.  HISTORY OF PRESENT ILLNESS: Mckenzie Webb is a 71 y.o. woman who presents with Stage 1, 1:00 o'clock right breast invasive ductal carcinoma.  Biopsy showed grade 1 invasive ductal carcinoma with characteristics as described above in the diagnosis. Patient presented with a spiculated retroareolar right breast mass on screening mammography.On ultrasound, mass is 5 mm, and axilla was negative. Patient presents today with her husband and her son. Her father had pancreatic cancer.   She is otherwise in her USOH.  PREVIOUS RADIATION THERAPY: No  PAST MEDICAL HISTORY:  has a past medical history of Allergy; Diabetes mellitus (4/07); DJD (degenerative joint disease), cervical (04 2002); Edema; GERD (gastroesophageal reflux disease); Hyperlipidemia; Hypothyroid; Interstitial cystitis; Leukopenia; Obesity; Prediabetes (06/2014); and Psoriasis.    PAST SURGICAL HISTORY: Past Surgical History:  Procedure Laterality Date  . CHOLECYSTECTOMY    . COLONOSCOPY  3/09  . KNEE ARTHROSCOPY Right 1990  . KNEE ARTHROSCOPY Left 12/12/2010    (Dr. Noemi Chapel)  . TUBAL LIGATION  1983  . VARICOSE VEIN SURGERY  09/2009 R, 06/2010 L    FAMILY HISTORY: family history includes Arthritis in her mother; COPD in her brother; Cancer in her father and son; Colon cancer in her other; Diabetes in her father and sister; Diverticulitis in her sister;  Gallbladder disease in her mother and sister; Heart disease in her mother; Hypertension in her mother, sister, and sister; Lung cancer in her cousin; Pulmonary embolism in her sister; Stroke in her sister; Thyroid disease in her mother; Ulcerative colitis in her sister.  SOCIAL HISTORY:  reports that she has never smoked. She has never used smokeless tobacco. She reports that she does not drink alcohol or use drugs.  ALLERGIES: Ciprofloxacin; Codeine; Iodine; Polysporin [bacitracin-polymyxin b]; Sulfa antibiotics; and Penicillins  MEDICATIONS:  Current Outpatient Prescriptions  Medication Sig Dispense Refill  . anastrozole (ARIMIDEX) 1 MG tablet Take 1 tablet (1 mg total) by mouth daily. 90 tablet 4  . Calcium-Vitamin D (CALTRATE 600 PLUS-VIT D PO) Take 1 tablet by mouth daily.      . Cholecalciferol (VITAMIN D) 1000 UNITS capsule Take 1,000 Units by mouth daily.      Marland Kitchen co-enzyme Q-10 30 MG capsule Take 100 mg by mouth daily.      . Cyanocobalamin (VITAMIN B 12 PO) Take 1,000 mcg by mouth.     . diclofenac (VOLTAREN) 75 MG EC tablet Take 75 mg by mouth 2 (two) times daily.    . fish oil-omega-3 fatty acids 1000 MG capsule Take 2 g by mouth daily.     Marland Kitchen ibuprofen (ADVIL,MOTRIN) 200 MG tablet Take 200 mg by mouth every 6 (six) hours as needed.    Marland Kitchen levothyroxine (SYNTHROID, LEVOTHROID) 88 MCG tablet Take 1 tablet (88 mcg total) by mouth daily. 90 tablet 3  . loratadine (CLARITIN) 10 MG tablet Take 10 mg by mouth daily.    Marland Kitchen losartan-hydrochlorothiazide (HYZAAR)  50-12.5 MG tablet TAKE ONE (1) TABLET BY MOUTH EVERY DAY 90 tablet 3  . Multiple Vitamins-Minerals (ALIVE WOMENS 50+ PO) Take by mouth.    . Naproxen Sodium (ALEVE PO) Take 1 tablet by mouth 2 (two) times daily.    Marland Kitchen omeprazole (PRILOSEC) 40 MG capsule TAKE ONE (1) CAPSULE EACH DAY 90 capsule 2  . Probiotic Product (PROBIOTIC DAILY PO) Take 1 tablet by mouth daily.    . Red Yeast Rice Extract (RED YEAST RICE PO) Take 4 tablets by mouth  daily.      Marland Kitchen triamcinolone cream (KENALOG) 0.1 % Apply 1 application topically 2 (two) times daily. 45 g 2   No current facility-administered medications for this encounter.     REVIEW OF SYSTEMS: A 10+ POINT REVIEW OF SYSTEMS WAS OBTAINED including neurology, dermatology, psychiatry, cardiac, respiratory, lymph, extremities, GI, GU, Musculoskeletal, constitutional, breasts, reproductive, HEENT.  All pertinent positives are noted in the HPI.  All others are negative, expect patient is positive for a lump in her breast, psoriasis on her skin, bruising easily on her skin, back pain, arthritis, and thyroid problem. She wears glasses.  PHYSICAL EXAM:  Vitals with Age-Percentiles 03/12/2017  Length 382.5 cm  Systolic 053  Diastolic 976  Pulse 66  Respiration 18  Weight 79.062 kg  BMI 34.1  VISIT REPORT    General: Alert and oriented, in no acute distress HEENT: Head is normocephalic. Extraocular movements are intact. Oropharynx is clear. Neck: Neck is supple, no palpable cervical or supraclavicular lymphadenopathy. Heart: Regular in rate and rhythm with no murmurs, rubs, or gallops. Chest: Clear to auscultation bilaterally, with no rhonchi, wheezes, or rales. Abdomen: Soft, nontender, nondistended, with no rigidity or guarding. Extremities: No cyanosis or edema. Lymphatics: see Neck Exam Skin: No concerning lesions. Musculoskeletal: symmetric strength and muscle tone throughout. Neurologic: Cranial nerves II through XII are grossly intact. No obvious focalities. Speech is fluent. Coordination is intact. Psychiatric: Judgment and insight are intact. Affect is appropriate. Breasts: Left breast no palpable mass, central bruising and swelling in the right breast. Right axilla is clear.  ECOG = 0  0 - Asymptomatic (Fully active, able to carry on all predisease activities without restriction)  1 - Symptomatic but completely ambulatory (Restricted in physically strenuous activity but  ambulatory and able to carry out work of a light or sedentary nature. For example, light housework, office work)  2 - Symptomatic, <50% in bed during the day (Ambulatory and capable of all self care but unable to carry out any work activities. Up and about more than 50% of waking hours)  3 - Symptomatic, >50% in bed, but not bedbound (Capable of only limited self-care, confined to bed or chair 50% or more of waking hours)  4 - Bedbound (Completely disabled. Cannot carry on any self-care. Totally confined to bed or chair)  5 - Death   Eustace Pen MM, Creech RH, Tormey DC, et al. [phone]). "Toxicity and response criteria of the [facility]". [place] Oncol. 5 (6): 649-55   LABORATORY DATA:  Lab Results  Component Value Date   WBC 4.9 03/12/2017   HGB 14.0 03/12/2017   HCT 42.1 03/12/2017   MCV 95.5 03/12/2017   PLT 209 03/12/2017   CMP     Component Value Date/Time   NA 142 03/12/2017 1225   K 4.0 03/12/2017 1225   CL 100 07/31/2016 0845   CO2 27 03/12/2017 1225   GLUCOSE 100 03/12/2017 1225   BUN 14.1 03/12/2017  1225   CREATININE 0.9 03/12/2017 1225   CALCIUM 9.5 03/12/2017 1225   PROT 7.5 03/12/2017 1225   ALBUMIN 3.9 03/12/2017 1225   AST 23 03/12/2017 1225   ALT 31 03/12/2017 1225   ALKPHOS 58 03/12/2017 1225   BILITOT 0.49 03/12/2017 1225         RADIOGRAPHY: US Breast Ltd Uni Right Inc Axilla  Result Date: 02/27/2017 CLINICAL DATA:  Right subareolar breast possible mass seen on most recent screening mammography. EXAM: 2D DIGITAL DIAGNOSTIC RIGHT MAMMOGRAM WITH CAD AND ADJUNCT TOMO ULTRASOUND RIGHT BREAST COMPARISON:  Previous exam(s). ACR Breast Density Category b: There are scattered areas of fibroglandular density. FINDINGS: Additional mammographic views of the right breast demonstrate persistent spiculated mass in the subareolar right breast slightly upper inner quadrant, anterior depth. Abnormality measures 5 mm mammographically. Mammographic  images were processed with CAD. On physical exam, no suspicious masses are palpated. There is right nipple inversion, which has been chronic per patient's report. Targeted ultrasound is performed, showing right breast 1 o'clock retroareolar breast 0.5 by 0.4 by 0.4 cm slightly spiculated solid nodule with increased internal vascularity. There is no sonographic evidence of right axillary lymphadenopathy. IMPRESSION: Right breast 1 o'clock 5 mm suspicious nodule, for which ultrasound-guided core needle biopsy is recommended. RECOMMENDATION: Ultrasound-guided core needle biopsy of the right breast. I have discussed the findings and recommendations with the patient. Results were also provided in writing at the conclusion of the visit. If applicable, a reminder letter will be sent to the patient regarding the next appointment. BI-RADS CATEGORY  4: Suspicious. Electronically Signed   By: Fidela Salisbury M.D.   On: 02/27/2017 12:23   Mm Diag Breast Tomo Uni Right  Result Date: 02/27/2017 CLINICAL DATA:  Right subareolar breast possible mass seen on most recent screening mammography. EXAM: 2D DIGITAL DIAGNOSTIC RIGHT MAMMOGRAM WITH CAD AND ADJUNCT TOMO ULTRASOUND RIGHT BREAST COMPARISON:  Previous exam(s). ACR Breast Density Category b: There are scattered areas of fibroglandular density. FINDINGS: Additional mammographic views of the right breast demonstrate persistent spiculated mass in the subareolar right breast slightly upper inner quadrant, anterior depth. Abnormality measures 5 mm mammographically. Mammographic images were processed with CAD. On physical exam, no suspicious masses are palpated. There is right nipple inversion, which has been chronic per patient's report. Targeted ultrasound is performed, showing right breast 1 o'clock retroareolar breast 0.5 by 0.4 by 0.4 cm slightly spiculated solid nodule with increased internal vascularity. There is no sonographic evidence of right axillary  lymphadenopathy. IMPRESSION: Right breast 1 o'clock 5 mm suspicious nodule, for which ultrasound-guided core needle biopsy is recommended. RECOMMENDATION: Ultrasound-guided core needle biopsy of the right breast. I have discussed the findings and recommendations with the patient. Results were also provided in writing at the conclusion of the visit. If applicable, a reminder letter will be sent to the patient regarding the next appointment. BI-RADS CATEGORY  4: Suspicious. Electronically Signed   By: Fidela Salisbury M.D.   On: 02/27/2017 12:23   Mm Screening Breast Tomo Bilateral  Result Date: 02/24/2017 CLINICAL DATA:  Screening. EXAM: 2D DIGITAL SCREENING BILATERAL MAMMOGRAM WITH CAD AND ADJUNCT TOMO COMPARISON:  Previous exam(s). ACR Breast Density Category b: There are scattered areas of fibroglandular density. FINDINGS: In the right breast, a possible mass warrants further evaluation. In the left breast, no findings suspicious for malignancy. Images were processed with CAD. IMPRESSION: Further evaluation is suggested for possible mass in the right breast. RECOMMENDATION: Diagnostic mammogram and possibly ultrasound of the right breast. (  Code:[id number]) The patient will be contacted regarding the findings, and additional imaging will be scheduled. BI-RADS CATEGORY  0: Incomplete. Need additional imaging evaluation and/or prior mammograms for comparison. Electronically Signed   By: Pamelia Hoit M.D.   On: 02/24/2017 08:04   Mm Clip Placement Right  Result Date: 03/03/2017 CLINICAL DATA:  Status post ultrasound-guided core biopsy of right breast mass. EXAM: DIAGNOSTIC RIGHT MAMMOGRAM POST ULTRASOUND BIOPSY COMPARISON:  Previous exam(s). FINDINGS: Mammographic images were obtained following ultrasound guided biopsy of a mass in the 1 o'clock subareolar region of the right breast. Mammographic images showed post biopsy changes in the right breast. There is a ribbon shaped clip in appropriate position in the  subareolar 1 o'clock region of the right breast. IMPRESSION: Status post ultrasound-guided core biopsy of the right breast with pathology pending. Final Assessment: Post Procedure Mammograms for Marker Placement Electronically Signed   By: Lillia Mountain M.D.   On: 03/03/2017 15:39   [place] Rt Breast Bx W Loc Dev 1st Lesion Img Bx Spec US Guide  Addendum Date: 03/04/2017   ADDENDUM REPORT: 03/04/2017 13:51 ADDENDUM: Pathology revealed GRADE I-II INVASIVE DUCTAL CARCINOMA, PERINEURAL INVASION IS IDENTIFIED of the Right breast, 1:00 o'clock. This was found to be concordant by Dr. Lillia Mountain. Pathology results were discussed with the patient by telephone. The patient reported doing well after the biopsy with tenderness and bruising at the site. Post biopsy instructions and care were reviewed and questions were answered. The patient was encouraged to call [facility] for any additional concerns. The patient was referred to [facility] on March 12, 2017. Pathology results reported by Terie Purser, RN on 03/04/2017. Electronically Signed   By: Lillia Mountain M.D.   On: 03/04/2017 13:51   Result Date: 03/04/2017 CLINICAL DATA:  Right breast mass. EXAM: ULTRASOUND GUIDED RIGHT BREAST CORE NEEDLE BIOPSY COMPARISON:  Previous exam(s). FINDINGS: I met with the patient and we discussed the procedure of ultrasound-guided biopsy, including benefits and alternatives. We discussed the high likelihood of a successful procedure. We discussed the risks of the procedure, including infection, bleeding, tissue injury, clip migration, and inadequate sampling. Informed written consent was given. The usual time-out protocol was performed immediately prior to the procedure. Lesion quadrant: Right upper inner quadrant Using sterile technique and 1% Lidocaine as local anesthetic, under direct ultrasound visualization, a 14 gauge spring-loaded  device was used to perform biopsy of a mass in the 1 o'clock region of the right breast using an inferior to superior approach. At the conclusion of the procedure a ribbon shaped tissue marker clip was deployed into the biopsy cavity. Follow up 2 view mammogram was performed and dictated separately. IMPRESSION: Ultrasound guided biopsy of the right breast. No apparent complications. Electronically Signed: By: Lillia Mountain M.D. On: 03/03/2017 15:36      IMPRESSION/PLAN: right breast cancer, stage I    It was a pleasure meeting the patient today. We discussed the risks, benefits, and side effects of radiotherapy. I recommend radiotherapy to the right breast to reduce her risk of locoregional recurrence by 2/3.  We discussed that radiation would take approximately 3-4 weeks to complete and that I would give the patient a few weeks to heal following surgery before starting treatment planning. If chemotherapy were to be given, this would precede radiotherapy. We spoke about acute effects including skin irritation and fatigue as well as much less common late effects including internal  organ injury or irritation. We spoke about the latest technology that is used to minimize the risk of late effects for patients undergoing radiotherapy to the breast or chest wall. No guarantees of treatment were given. The patient is enthusiastic about proceeding with treatment. I look forward to participating in the patient's care.  I will await her referral back to me for postoperative follow-up and eventual CT simulation/treatment planning.  Her blood pressure which was elevated at initial time of consult (this is out of characteristic for her, per patient; she is a little anxious but otherwise asymptomatic). Patient will continue to follow with her PCP for this.   __________________________________________   Eppie Gibson, MD   This document serves as a record of services personally performed by Eppie Gibson, MD. It was  created on her behalf by Valeta Harms, a trained medical scribe. The creation of this record is based on the scribe's personal observations and the provider's statements to them. This document has been checked and approved by the attending provider.

## 2017-03-12 NOTE — Therapy (Addendum)
Mckenzie Webb, [place], [address] Phone: [phone]   Fax:  [phone]  Physical Therapy Evaluation  Patient Details  Name: Mckenzie Webb MRN: [id number] Date of Birth: May 22, 1946 Referring Provider: Dr. Autumn Webb  Encounter Date: 03/12/2017      PT End of Session - 03/12/17 1621    Visit Number 1   Number of Visits 1   PT Start Time 6720   PT Stop Time 9470  Also saw pt from 1531-1540 for a total of 26 minutes   PT Time Calculation (min) 17 min   Activity Tolerance Patient tolerated treatment well   Behavior During Therapy [facility] for tasks assessed/performed      Past Medical History:  Diagnosis Date  . Allergy   . Diabetes mellitus 4/07   pt reports that all subsequent tests were normal, not diabetic  . DJD (degenerative joint disease), cervical 04 2002  . Edema    resolved  . GERD (gastroesophageal reflux disease)   . Hyperlipidemia   . Hypothyroid   . Interstitial cystitis   . Leukopenia   . Obesity   . Prediabetes 06/2014   fasting glucose 109; A1c 5.7  . Psoriasis     Past Surgical History:  Procedure Laterality Date  . CHOLECYSTECTOMY    . COLONOSCOPY  3/09  . KNEE ARTHROSCOPY Right 1990  . KNEE ARTHROSCOPY Left 12/12/2010    (Dr. Noemi Webb)  . TUBAL LIGATION  1983  . VARICOSE VEIN SURGERY  09/2009 R, 06/2010 L    There were no vitals filed for this visit.       Subjective Assessment - 03/12/17 1616    Subjective Patient reports she is here today to be seen by her medical team for her newly diagnosed right breast cancer.   Patient is accompained by: Family member   Pertinent History Patient was diagnosed on 02/21/17 with right grade 1 invasive ductal carcinoma breast cancer. It measures 5 mm and is located in the upper inner quadrant. It is ER/PR positive and HER2 negative with a Ki67 of 5%.   Patient Stated Goals Reduce lymphedema risk and learn post op shoudler ROM HEP    Currently in Pain? No/denies            [facility] PT Assessment - 03/12/17 0001      Assessment   Medical Diagnosis Right breast cancer   Referring Provider Dr. Autumn Webb   Onset Date/Surgical Date 02/21/17   Hand Dominance Right   Prior Therapy none     Precautions   Precautions Other (comment)   Precaution Comments active breast cancer     Restrictions   Weight Bearing Restrictions No     Balance Screen   Has the patient fallen in the past 6 months Yes   How many times? 1  tripped over garden hose; not a balance issue   Has the patient had a decrease in activity level because of a fear of falling?  No   Is the patient reluctant to leave their home because of a fear of falling?  No     Home Environment   Living Environment Private residence   Living Arrangements Spouse/significant other   Available Help at Discharge Family     Prior Function   Level of [place] Retired   Biomedical scientist --   Leisure Does not exercise     Cognition   Overall Cognitive Status Within Functional Limits for tasks  assessed     Posture/Postural Control   Posture/Postural Control Postural limitations   Postural Limitations Rounded Shoulders;Forward head     ROM / Strength   AROM / PROM / Strength AROM;Strength     AROM   AROM Assessment Site Shoulder;Cervical   Right/Left Shoulder Right;Left   Right Shoulder Extension 63 Degrees   Right Shoulder Flexion 148 Degrees   Right Shoulder ABduction 156 Degrees   Right Shoulder Internal Rotation 71 Degrees   Right Shoulder External Rotation 71 Degrees   Left Shoulder Extension 65 Degrees   Left Shoulder Flexion 149 Degrees   Left Shoulder ABduction 165 Degrees   Left Shoulder Internal Rotation 82 Degrees   Left Shoulder External Rotation 80 Degrees   Cervical Flexion WNL   Cervical Extension WNL   Cervical - Right Side Bend WNL   Cervical - Left Side Bend WNL   Cervical - Right Rotation WNL   Cervical  - Left Rotation WNL     Strength   Overall Strength Within functional limits for tasks performed           LYMPHEDEMA/ONCOLOGY QUESTIONNAIRE - 03/12/17 1620      Type   Cancer Type Right breast cancer     Lymphedema Assessments   Lymphedema Assessments Upper extremities     Right Upper Extremity Lymphedema   10 cm Proximal to Olecranon Process 29.3 cm   Olecranon Process 26.6 cm   10 cm Proximal to Ulnar Styloid Process 22.9 cm   Just Proximal to Ulnar Styloid Process 16.8 cm   Across Hand at [employer] 18.8 cm   At [place] of 2nd Digit 6.3 cm     Left Upper Extremity Lymphedema   10 cm Proximal to Olecranon Process 29.6 cm   Olecranon Process 27 cm   10 cm Proximal to Ulnar Styloid Process 22.7 cm   Just Proximal to Ulnar Styloid Process 16.4 cm   Across Hand at [employer] 19.4 cm   At [place] of 2nd Digit 6.2 cm         Objective measurements completed on examination: See above findings.         Patient was instructed today in a home exercise program today for post op shoulder range of motion. These included active assist shoulder flexion in sitting, scapular retraction, wall walking with shoulder abduction, and hands behind head external rotation.  She was encouraged to do these twice a day, holding 3 seconds and repeating 5 times when permitted by her physician.             PT Education - 03/12/17 1621    Education provided Yes   Education Details Lymphedema risk reduction and post op shoulder ROM HEP   Person(s) Educated Patient;Spouse   Methods Explanation;Demonstration;Handout   Comprehension Returned demonstration;Verbalized understanding              Breast Clinic Goals - 03/12/17 1624      Patient will be able to verbalize understanding of pertinent lymphedema risk reduction practices relevant to her diagnosis specifically related to skin care.   Time 1   Period Days   Status Achieved     Patient will be able to return  demonstrate and/or verbalize understanding of the post-op home exercise program related to regaining shoulder range of motion.   Time 1   Period Days   Status Achieved     Patient will be able to verbalize understanding of the importance of attending the postoperative  After Breast Cancer Class for further lymphedema risk reduction education and therapeutic exercise.   Time 1   Period Days   Status Achieved               Plan - 03/12/17 1622    Clinical Impression Statement Patient was diagnosed on 02/21/17 with right grade 1 invasive ductal carcinoma breast cancer. It measures 5 mm and is located in the upper inner quadrant. It is ER/PR positive and HER2 negative with a Ki67 of 5%. Her multidisciplinary medical team met prior to her assessments to determine a recommended treatment plan. She is planning to have a right lumpectomy and sentinel node biopsy followed by radiation and anti-estrogen therapy. She may benefit from post op PT to regain shoulder ROM and reduce lymphedema risk.   History and Personal Factors relevant to plan of care: none   Clinical Presentation Stable   Clinical Presentation due to: Condition is stable   Clinical Decision Making Low   Rehab Potential Excellent   Clinical Impairments Affecting Rehab Potential none   PT Frequency One time visit   PT Treatment/Interventions Patient/family education;Therapeutic exercise   PT Next Visit Plan Will f/u after surgery to determine PT needs   PT Home Exercise Plan Post op shoulder ROM HEP   Consulted and Agree with Plan of Care Patient;Family member/caregiver   Family Member Consulted Husband      Patient will benefit from skilled therapeutic intervention in order to improve the following deficits and impairments:  Postural dysfunction, Decreased knowledge of precautions, Pain, Impaired UE functional use, Decreased range of motion  Visit Diagnosis: Carcinoma of upper-inner quadrant of right breast in female, estrogen  receptor positive ([place]) - Plan: PT plan of care cert/re-cert  Abnormal posture - Plan: PT plan of care cert/re-cert      G-Codes - 93/26/71 1624    Functional Assessment Tool Used (Outpatient Only) Clinical Judgement   Functional Limitation Other PT primary   Other PT Primary Current Status (I4580) At least 1 percent but less than 20 percent impaired, limited or restricted   Other PT Primary Goal Status (D9833) At least 1 percent but less than 20 percent impaired, limited or restricted   Other PT Primary Discharge Status (A2505) At least 1 percent but less than 20 percent impaired, limited or restricted     Patient will follow up at outpatient cancer rehab if needed following surgery.  If the patient requires physical therapy at that time, a specific plan will be dictated and sent to the referring physician for approval. The patient was educated today on appropriate basic range of motion exercises to begin post operatively and the importance of attending the After Breast Cancer class following surgery.  Patient was educated today on lymphedema risk reduction practices as it pertains to recommendations that will benefit the patient immediately following surgery.  She verbalized good understanding.  No additional physical therapy is indicated at this time.     Problem List Patient Active Problem List   Diagnosis Date Noted  . Malignant neoplasm of upper-inner quadrant of right breast in female, estrogen receptor positive ([place]) 03/11/2017  . Essential hypertension, benign 07/31/2015  . IFG (impaired fasting glucose) 05/03/2015  . Psoriasis 05/03/2014  . Greater trochanteric bursitis of right hip 03/14/2014  . Obesity (BMI 30-39.9) 05/26/2013  . GERD (gastroesophageal reflux disease) 12/31/2010  . Hypothyroidism 12/31/2010  . Vitamin D deficiency 12/31/2010  . Pure hypercholesterolemia 12/31/2010   Annia Friendly, PT 03/12/17 4:34 PM  [facility]  [place], [place], [address] Phone: [phone]   Fax:  [phone]  Name: Mckenzie Webb MRN: [id number] Date of Birth: Mar 24, 1946

## 2017-03-12 NOTE — Progress Notes (Signed)
[facility] Psychosocial Distress Screening Spiritual Care  Counselor met with patient, patient's husband, and patient's son in [facility] to introduce [facility] team/resources, reviewing distress screen per protocol. The patient scored a 5/10 on the Psychosocial Distress Thermometer which indicates Moderate distress. Also assessed for distress and other psychosocial needs.   Patient originally reported a 10/10 on distress screening, but asked counselor to decrease it to a 5/10 after talking with her doctors and treatment team. Patient stated that her anxiety had decreased due to gaining information about tx and her diagnosis; patient shared that the majority of her anxiety came from the "unknowns of the situation." Counselor normalized patient's reaction and provided information on support services, including Breast Cancer Support Group, individual counseling, and the myriad of classes offered. Patient seemed interested in these opportunities, though too overwhelmed by the day to discuss them in depth. Patient could benefit from a call from LCSW to remind her of these opportunities and encourage her to attend a support group meeting.   Follow up needed: Yes.  ONCBCN DISTRESS SCREENING 03/12/2017  Screening Type Initial Screening  Distress experienced in past week (1-10) 5  Emotional problem type Nervousness/Anxiety;Adjusting to illness  Referral to support programs Yes   Westly Pam, [place] [facility] [place]

## 2017-03-17 ENCOUNTER — Ambulatory Visit (HOSPITAL_BASED_OUTPATIENT_CLINIC_OR_DEPARTMENT_OTHER): Payer: Medicare Other | Admitting: Genetic Counselor

## 2017-03-17 ENCOUNTER — Other Ambulatory Visit: Payer: Medicare Other

## 2017-03-17 ENCOUNTER — Encounter: Payer: Self-pay | Admitting: Genetic Counselor

## 2017-03-17 ENCOUNTER — Other Ambulatory Visit: Payer: Self-pay | Admitting: General Surgery

## 2017-03-17 DIAGNOSIS — Z800 Family history of malignant neoplasm of digestive organs: Secondary | ICD-10-CM | POA: Insufficient documentation

## 2017-03-17 DIAGNOSIS — Z170 Estrogen receptor positive status [ER+]: Secondary | ICD-10-CM

## 2017-03-17 DIAGNOSIS — C50211 Malignant neoplasm of upper-inner quadrant of right female breast: Secondary | ICD-10-CM | POA: Diagnosis present

## 2017-03-17 DIAGNOSIS — C50111 Malignant neoplasm of central portion of right female breast: Secondary | ICD-10-CM

## 2017-03-17 NOTE — Progress Notes (Signed)
REFERRING PROVIDER: Chauncey Cruel, MD [address] [place], [place] [address]  PRIMARY PROVIDER:  Rita Ohara, MD  PRIMARY REASON FOR VISIT:  1. Malignant neoplasm of upper-inner quadrant of right breast in female, estrogen receptor positive ([place])   2. Family history of pancreatic cancer   3. Family history of colon cancer      HISTORY OF PRESENT ILLNESS:   Mckenzie Webb, a 71 y.o. female, was seen for a [facility] cancer genetics consultation at the request of Dr. [name] due to a personal and family history of cancer.  Ms. Vidas presents to clinic today to discuss the possibility of a hereditary predisposition to cancer, genetic testing, and to further clarify her future cancer risks, as well as potential cancer risks for family members.   In 2018, at the age of 43, Ms. Haggart was diagnosed with invasive ductal carcinoma of the right breast. This will be treated with lumpectomy and radiation.  Chemotherapy will be determined by oncotype.    CANCER HISTORY:   No history exists.     HORMONAL RISK FACTORS:  Menarche was at age 88.  First live birth at age 54.  OCP use for approximately >20 years.  Ovaries intact: yes.  Hysterectomy: no.  Menopausal status: postmenopausal.  HRT use: 0 years. Colonoscopy: yes; normal. Mammogram within the last year: yes. Number of breast biopsies: 1. Up to date with pelvic exams:  yes. Any excessive radiation exposure in the past:  no  Past Medical History:  Diagnosis Date  . Allergy   . Diabetes mellitus 4/07   pt reports that all subsequent tests were normal, not diabetic  . DJD (degenerative joint disease), cervical 04 2002  . Edema    resolved  . Family history of colon cancer   . Family history of pancreatic cancer   . GERD (gastroesophageal reflux disease)   . Hyperlipidemia   . Hypothyroid   . Interstitial cystitis   . Leukopenia   . Obesity   . Prediabetes 06/2014   fasting glucose 109; A1c 5.7  . Psoriasis      Past Surgical History:  Procedure Laterality Date  . CHOLECYSTECTOMY    . COLONOSCOPY  3/09  . KNEE ARTHROSCOPY Right 1990  . KNEE ARTHROSCOPY Left 12/12/2010    (Dr. Noemi Chapel)  . TUBAL LIGATION  1983  . VARICOSE VEIN SURGERY  09/2009 R, 06/2010 L    Social History   Social History  . Marital status: Married    Spouse name: N/A  . Number of children: 2  . Years of education: N/A   Occupational History  . office work for [employer]   Social History Main Topics  . Smoking status: Never Smoker  . Smokeless tobacco: Never Used  . Alcohol use No     Comment: social, 1-2 times per year  . Drug use: No  . Sexual activity: Yes    Partners: Male   Other Topics Concern  . None   Social History Narrative   Lives with husband, no pets.  Children in [place], [place] and [place].  1 granddaughter (in [place]). Retired 05/2014.     FAMILY HISTORY:  We obtained a detailed, 4-generation family history.  Significant diagnoses are listed below: Family History  Problem Relation Age of Onset  . Hypertension Mother   . Heart disease Mother   . Gallbladder disease Mother   . Thyroid disease Mother   . Arthritis Mother   . Diabetes Father   .  Cancer Father        pancreatic  . Hypertension Sister   . Gallbladder disease Sister   . Diabetes Sister   . Diverticulitis Sister        of small intestine  . Cancer Son        testicular cancer  . Stroke Sister        late 58's  . Hypertension Sister   . Pulmonary embolism Sister   . Ulcerative colitis Sister   . COPD Brother        had lung lobe removed for suspected cancer, but was benign; smoker  . Colon cancer Other 71  . Lung cancer Cousin        maternal first cousin - smoker  . Breast cancer Neg Hx     The patient has two sons, one who was diagnosed with testicular cancer at 34.  She has one brother and three sisters.  One sister died from blood clots, and one sister has a daughter who had colon  cancer at 20.  The patients father had pancreatic cancer at 63.  He was one of 12 siblings, no information is available about the paternal side of the family.  The patient's mother died at 54 from smoking.  She had three brothers and three sisters, none who had cancer.  One aunt has a daughter with lung cancer.  There is no other reported family history of cancer.  Ms. Prickett is unaware of previous family history of genetic testing for hereditary cancer risks. Patient's maternal ancestors are of Caucasian descent, and paternal ancestors are of Caucasian descent. There is no reported Ashkenazi Jewish ancestry. There is no known consanguinity.  GENETIC COUNSELING ASSESSMENT: ONIE KASPAREK is a 71 y.o. female with a personal and family history of cancer which is somewhat suggestive of a hereditary cancer syndrome and predisposition to cancer. We, therefore, discussed and recommended the following at today's visit.   DISCUSSION: We discussed that about 5-10% of breast cancer is hereditary with most cases due to BRCA mutations.  We reviewed that recently, genetic testing referral criteria has changed to include anyone who has a family history of pancreatic cancer should be referred, and tested.  Based on her family history, she could also be at risk for a hereditary colon cancer syndrome that includes breast cancer, such as Lynch syndrome.  We reviewed the characteristics, features and inheritance patterns of hereditary cancer syndromes. We also discussed genetic testing, including the appropriate family members to test, the process of testing, insurance coverage and turn-around-time for results. We discussed the implications of a negative, positive and/or variant of uncertain significant result. Based on Ms. Vecchiarelli upcoming lumpectomy, we recommended the [facility] 9 gene STAT panel.  This comes back negative, we could pursue genetic testing for the common hereditary cancer gene panel. The Hereditary Gene  Panel offered by [facility] includes sequencing and/or deletion duplication testing of the following 46 genes: APC, ATM, AXIN2, BARD1, BMPR1A, BRCA1, BRCA2, BRIP1, CDH1, CDKN2A (p14ARF), CDKN2A (p16INK4a), CHEK2, CTNNA1, DICER1, EPCAM (Deletion/duplication testing only), GREM1 (promoter region deletion/duplication testing only), KIT, MEN1, MLH1, MSH2, MSH3, MSH6, MUTYH, NBN, NF1, NHTL1, PALB2, PDGFRA, PMS2, POLD1, POLE, PTEN, RAD50, RAD51C, RAD51D, SDHB, SDHC, SDHD, SMAD4, SMARCA4. STK11, TP53, TSC1, TSC2, and VHL.  The following genes were evaluated for sequence changes only: SDHA and HOXB13 c.251G>A variant only.  Based on Ms. Winecoff personal and family history of cancer, she meets medical criteria for genetic testing. Despite that she meets criteria, she  may still have an out of pocket cost. We discussed that if her out of pocket cost for testing is over $100, the laboratory will call and confirm whether she wants to proceed with testing.  If the out of pocket cost of testing is less than $100 she will be billed by the genetic testing laboratory.   PLAN: Despite our recommendation, Ms. Cisse did not wish to pursue genetic testing at today's visit. We understand this decision, and remain available to coordinate genetic testing at any time in the future. We; therefore, recommend Ms. Mcdowell continue to follow the cancer screening guidelines given by her primary healthcare provider.  Lastly, we encouraged Ms. Ridings to remain in contact with cancer genetics annually so that we can continuously update the family history and inform her of any changes in cancer genetics and testing that may be of benefit for this family.   Ms.  Gadbois questions were answered to her satisfaction today. Our contact information was provided should additional questions or concerns arise. Thank you for the referral and allowing [place] to share in the care of your patient.   Ayron Fillinger P. Florene Glen, [place], [facility] Certified Genetic  Counselor [email or web address] phone: [phone]  The patient was seen for a total of 60 minutes in face-to-face genetic counseling.  This patient was discussed with Drs. Magrinat, Lindi Adie and/or Burr Medico who agrees with the above.    _______________________________________________________________________ For Office Staff:  Number of people involved in session: 1 Was an Intern/ student involved with case: no

## 2017-03-17 NOTE — Addendum Note (Signed)
Encounter addended by: Eppie Gibson, MD on: 03/17/2017  8:05 AM<BR>    Actions taken: Sign clinical note

## 2017-03-18 ENCOUNTER — Telehealth: Payer: Self-pay | Admitting: *Deleted

## 2017-03-18 NOTE — Telephone Encounter (Signed)
Spoke with patient from [facility] 03/12/17.  She is doing well with no complaints.

## 2017-03-21 ENCOUNTER — Encounter (HOSPITAL_BASED_OUTPATIENT_CLINIC_OR_DEPARTMENT_OTHER): Payer: Self-pay | Admitting: *Deleted

## 2017-03-25 ENCOUNTER — Ambulatory Visit
Admission: RE | Admit: 2017-03-25 | Discharge: 2017-03-25 | Disposition: A | Discharge status: Home / Self Care | Payer: Medicare Other | Source: Ambulatory Visit | Attending: General Surgery | Admitting: General Surgery

## 2017-03-25 ENCOUNTER — Encounter (HOSPITAL_BASED_OUTPATIENT_CLINIC_OR_DEPARTMENT_OTHER)
Admission: RE | Admit: 2017-03-25 | Discharge: 2017-03-25 | Disposition: A | Discharge status: Home / Self Care | Payer: Medicare Other | Source: Ambulatory Visit | Attending: General Surgery | Admitting: General Surgery

## 2017-03-25 DIAGNOSIS — Z79899 Other long term (current) drug therapy: Secondary | ICD-10-CM | POA: Diagnosis not present

## 2017-03-25 DIAGNOSIS — Z170 Estrogen receptor positive status [ER+]: Secondary | ICD-10-CM | POA: Diagnosis not present

## 2017-03-25 DIAGNOSIS — F419 Anxiety disorder, unspecified: Secondary | ICD-10-CM | POA: Diagnosis not present

## 2017-03-25 DIAGNOSIS — K5190 Ulcerative colitis, unspecified, without complications: Secondary | ICD-10-CM | POA: Diagnosis not present

## 2017-03-25 DIAGNOSIS — Z881 Allergy status to other antibiotic agents status: Secondary | ICD-10-CM | POA: Diagnosis not present

## 2017-03-25 DIAGNOSIS — Z91041 Radiographic dye allergy status: Secondary | ICD-10-CM | POA: Diagnosis not present

## 2017-03-25 DIAGNOSIS — C50111 Malignant neoplasm of central portion of right female breast: Secondary | ICD-10-CM

## 2017-03-25 DIAGNOSIS — E039 Hypothyroidism, unspecified: Secondary | ICD-10-CM | POA: Diagnosis not present

## 2017-03-25 DIAGNOSIS — C773 Secondary and unspecified malignant neoplasm of axilla and upper limb lymph nodes: Secondary | ICD-10-CM | POA: Diagnosis not present

## 2017-03-25 DIAGNOSIS — Z885 Allergy status to narcotic agent status: Secondary | ICD-10-CM | POA: Diagnosis not present

## 2017-03-25 DIAGNOSIS — M1990 Unspecified osteoarthritis, unspecified site: Secondary | ICD-10-CM | POA: Diagnosis not present

## 2017-03-25 DIAGNOSIS — Z882 Allergy status to sulfonamides status: Secondary | ICD-10-CM | POA: Diagnosis not present

## 2017-03-25 DIAGNOSIS — Z880 Allergy status to penicillin: Secondary | ICD-10-CM | POA: Diagnosis not present

## 2017-03-25 DIAGNOSIS — I10 Essential (primary) hypertension: Secondary | ICD-10-CM | POA: Diagnosis not present

## 2017-03-25 DIAGNOSIS — Z91018 Allergy to other foods: Secondary | ICD-10-CM | POA: Diagnosis not present

## 2017-03-25 DIAGNOSIS — C50911 Malignant neoplasm of unspecified site of right female breast: Secondary | ICD-10-CM | POA: Diagnosis not present

## 2017-03-25 DIAGNOSIS — K219 Gastro-esophageal reflux disease without esophagitis: Secondary | ICD-10-CM | POA: Diagnosis not present

## 2017-03-25 NOTE — Progress Notes (Addendum)
Ensure presurgery drink given with instructions to complete by [facility], pt verbalized  Understanding.  EKG reviewed by Dr. Roanna Banning, will proceed with surgery as scheduled.

## 2017-03-26 ENCOUNTER — Ambulatory Visit (HOSPITAL_BASED_OUTPATIENT_CLINIC_OR_DEPARTMENT_OTHER): Payer: Medicare Other | Admitting: Anesthesiology

## 2017-03-26 ENCOUNTER — Ambulatory Visit
Admission: RE | Admit: 2017-03-26 | Discharge: 2017-03-26 | Disposition: A | Discharge status: Home / Self Care | Payer: Medicare Other | Source: Ambulatory Visit | Attending: General Surgery | Admitting: General Surgery

## 2017-03-26 ENCOUNTER — Encounter (HOSPITAL_COMMUNITY)
Admission: RE | Admit: 2017-03-26 | Discharge: 2017-03-26 | Disposition: A | Discharge status: Home / Self Care | Payer: Medicare Other | Source: Ambulatory Visit | Attending: General Surgery | Admitting: General Surgery

## 2017-03-26 ENCOUNTER — Encounter (HOSPITAL_BASED_OUTPATIENT_CLINIC_OR_DEPARTMENT_OTHER)
Admission: RE | Disposition: A | Discharge status: Home / Self Care | Payer: Self-pay | Source: Ambulatory Visit | Attending: General Surgery

## 2017-03-26 ENCOUNTER — Ambulatory Visit (HOSPITAL_BASED_OUTPATIENT_CLINIC_OR_DEPARTMENT_OTHER)
Admission: RE | Admit: 2017-03-26 | Discharge: 2017-03-26 | Disposition: A | Discharge status: Home / Self Care | Payer: Medicare Other | Source: Ambulatory Visit | Attending: General Surgery | Admitting: General Surgery

## 2017-03-26 ENCOUNTER — Encounter (HOSPITAL_BASED_OUTPATIENT_CLINIC_OR_DEPARTMENT_OTHER): Payer: Self-pay | Admitting: *Deleted

## 2017-03-26 DIAGNOSIS — Z881 Allergy status to other antibiotic agents status: Secondary | ICD-10-CM | POA: Insufficient documentation

## 2017-03-26 DIAGNOSIS — M1990 Unspecified osteoarthritis, unspecified site: Secondary | ICD-10-CM | POA: Insufficient documentation

## 2017-03-26 DIAGNOSIS — K219 Gastro-esophageal reflux disease without esophagitis: Secondary | ICD-10-CM | POA: Diagnosis not present

## 2017-03-26 DIAGNOSIS — C50211 Malignant neoplasm of upper-inner quadrant of right female breast: Secondary | ICD-10-CM | POA: Diagnosis not present

## 2017-03-26 DIAGNOSIS — C50111 Malignant neoplasm of central portion of right female breast: Secondary | ICD-10-CM | POA: Insufficient documentation

## 2017-03-26 DIAGNOSIS — Z170 Estrogen receptor positive status [ER+]: Principal | ICD-10-CM

## 2017-03-26 DIAGNOSIS — Z885 Allergy status to narcotic agent status: Secondary | ICD-10-CM | POA: Insufficient documentation

## 2017-03-26 DIAGNOSIS — G8918 Other acute postprocedural pain: Secondary | ICD-10-CM | POA: Diagnosis not present

## 2017-03-26 DIAGNOSIS — Z882 Allergy status to sulfonamides status: Secondary | ICD-10-CM | POA: Diagnosis not present

## 2017-03-26 DIAGNOSIS — F419 Anxiety disorder, unspecified: Secondary | ICD-10-CM | POA: Insufficient documentation

## 2017-03-26 DIAGNOSIS — Z880 Allergy status to penicillin: Secondary | ICD-10-CM | POA: Insufficient documentation

## 2017-03-26 DIAGNOSIS — C50911 Malignant neoplasm of unspecified site of right female breast: Secondary | ICD-10-CM | POA: Diagnosis not present

## 2017-03-26 DIAGNOSIS — C773 Secondary and unspecified malignant neoplasm of axilla and upper limb lymph nodes: Secondary | ICD-10-CM | POA: Insufficient documentation

## 2017-03-26 DIAGNOSIS — I10 Essential (primary) hypertension: Secondary | ICD-10-CM | POA: Diagnosis not present

## 2017-03-26 DIAGNOSIS — Z91018 Allergy to other foods: Secondary | ICD-10-CM | POA: Insufficient documentation

## 2017-03-26 DIAGNOSIS — Z79899 Other long term (current) drug therapy: Secondary | ICD-10-CM | POA: Insufficient documentation

## 2017-03-26 DIAGNOSIS — Z91041 Radiographic dye allergy status: Secondary | ICD-10-CM | POA: Insufficient documentation

## 2017-03-26 DIAGNOSIS — E039 Hypothyroidism, unspecified: Secondary | ICD-10-CM | POA: Insufficient documentation

## 2017-03-26 DIAGNOSIS — K5190 Ulcerative colitis, unspecified, without complications: Secondary | ICD-10-CM | POA: Diagnosis not present

## 2017-03-26 DIAGNOSIS — R928 Other abnormal and inconclusive findings on diagnostic imaging of breast: Secondary | ICD-10-CM | POA: Diagnosis not present

## 2017-03-26 HISTORY — PX: BREAST LUMPECTOMY: SHX2

## 2017-03-26 HISTORY — PX: BREAST LUMPECTOMY WITH RADIOACTIVE SEED AND SENTINEL LYMPH NODE BIOPSY: SHX6550

## 2017-03-26 SURGERY — BREAST LUMPECTOMY WITH RADIOACTIVE SEED AND SENTINEL LYMPH NODE BIOPSY
Anesthesia: Regional | Site: Breast | Laterality: Right

## 2017-03-26 MED ORDER — MIDAZOLAM HCL 2 MG/2ML IJ SOLN
INTRAMUSCULAR | Status: AC
  Filled 2017-03-26: qty 2

## 2017-03-26 MED ORDER — 0.9 % SODIUM CHLORIDE (POUR BTL) OPTIME
TOPICAL | Status: DC | PRN
  Administered 2017-03-26: 500 mL

## 2017-03-26 MED ORDER — MIDAZOLAM HCL 2 MG/2ML IJ SOLN
1.0000 mg | INTRAMUSCULAR | Status: DC | PRN
  Administered 2017-03-26: 2 mg via INTRAVENOUS

## 2017-03-26 MED ORDER — CHLORHEXIDINE GLUCONATE CLOTH 2 % EX PADS: 6.0000 | MEDICATED_PAD | Freq: Once | CUTANEOUS | Status: DC

## 2017-03-26 MED ORDER — BUPIVACAINE-EPINEPHRINE (PF) 0.5% -1:200000 IJ SOLN
INTRAMUSCULAR | Status: DC | PRN
  Administered 2017-03-26: 30 mL

## 2017-03-26 MED ORDER — EPHEDRINE 5 MG/ML INJ
INTRAVENOUS | Status: AC
  Filled 2017-03-26: qty 30

## 2017-03-26 MED ORDER — LIDOCAINE 2% (20 MG/ML) 5 ML SYRINGE
INTRAMUSCULAR | Status: AC
  Filled 2017-03-26: qty 5

## 2017-03-26 MED ORDER — OXYCODONE HCL 5 MG/5ML PO SOLN: 5.0000 mg | Freq: Once | ORAL | Status: DC | PRN

## 2017-03-26 MED ORDER — ACETAMINOPHEN 500 MG PO TABS
ORAL_TABLET | ORAL | Status: AC
  Filled 2017-03-26: qty 2

## 2017-03-26 MED ORDER — LIDOCAINE HCL (CARDIAC) 20 MG/ML IV SOLN
INTRAVENOUS | Status: DC | PRN
  Administered 2017-03-26: 30 mg via INTRAVENOUS

## 2017-03-26 MED ORDER — PROPOFOL 10 MG/ML IV BOLUS
INTRAVENOUS | Status: DC | PRN
  Administered 2017-03-26: 150 mg via INTRAVENOUS

## 2017-03-26 MED ORDER — FENTANYL CITRATE (PF) 100 MCG/2ML IJ SOLN
INTRAMUSCULAR | Status: AC
  Filled 2017-03-26: qty 2

## 2017-03-26 MED ORDER — OXYCODONE HCL 5 MG PO TABS: 5.0000 mg | ORAL_TABLET | Freq: Once | ORAL | Status: DC | PRN

## 2017-03-26 MED ORDER — PROPOFOL 10 MG/ML IV BOLUS
INTRAVENOUS | Status: AC
  Filled 2017-03-26: qty 20

## 2017-03-26 MED ORDER — GABAPENTIN 300 MG PO CAPS
ORAL_CAPSULE | ORAL | Status: AC
  Filled 2017-03-26: qty 1

## 2017-03-26 MED ORDER — HYDROMORPHONE HCL 1 MG/ML IJ SOLN
INTRAMUSCULAR | Status: AC
  Filled 2017-03-26: qty 0.5

## 2017-03-26 MED ORDER — LIDOCAINE 2% (20 MG/ML) 5 ML SYRINGE
INTRAMUSCULAR | Status: AC
  Filled 2017-03-26: qty 40

## 2017-03-26 MED ORDER — PROMETHAZINE HCL 25 MG/ML IJ SOLN: 6.2500 mg | INTRAMUSCULAR | Status: DC | PRN

## 2017-03-26 MED ORDER — EPHEDRINE SULFATE-NACL 50-0.9 MG/10ML-% IV SOSY
PREFILLED_SYRINGE | INTRAVENOUS | Status: DC | PRN
  Administered 2017-03-26: 10 mg via INTRAVENOUS

## 2017-03-26 MED ORDER — LACTATED RINGERS IV SOLN
INTRAVENOUS | Status: DC
  Administered 2017-03-26 (×2): via INTRAVENOUS

## 2017-03-26 MED ORDER — HYDROMORPHONE HCL 1 MG/ML IJ SOLN
0.2500 mg | INTRAMUSCULAR | Status: DC | PRN
  Administered 2017-03-26 (×2): 0.5 mg via INTRAVENOUS

## 2017-03-26 MED ORDER — HYDROCODONE-ACETAMINOPHEN 5-325 MG PO TABS: 1.0000 | ORAL_TABLET | ORAL | 0 refills | Status: DC | PRN

## 2017-03-26 MED ORDER — SCOPOLAMINE 1 MG/3DAYS TD PT72: 1.0000 | MEDICATED_PATCH | Freq: Once | TRANSDERMAL | Status: DC | PRN

## 2017-03-26 MED ORDER — GABAPENTIN 300 MG PO CAPS
300.0000 mg | ORAL_CAPSULE | ORAL | Status: AC
  Administered 2017-03-26: 300 mg via ORAL

## 2017-03-26 MED ORDER — ACETAMINOPHEN 500 MG PO TABS
1000.0000 mg | ORAL_TABLET | ORAL | Status: AC
  Administered 2017-03-26: 1000 mg via ORAL

## 2017-03-26 MED ORDER — DEXAMETHASONE SODIUM PHOSPHATE 4 MG/ML IJ SOLN
INTRAMUSCULAR | Status: DC | PRN
  Administered 2017-03-26: 10 mg via INTRAVENOUS

## 2017-03-26 MED ORDER — TRAMADOL HCL 50 MG PO TABS: 50.0000 mg | ORAL_TABLET | Freq: Four times a day (QID) | ORAL | 0 refills | Status: DC | PRN

## 2017-03-26 MED ORDER — PHENYLEPHRINE 40 MCG/ML (10ML) SYRINGE FOR IV PUSH (FOR BLOOD PRESSURE SUPPORT)
PREFILLED_SYRINGE | INTRAVENOUS | Status: AC
  Filled 2017-03-26: qty 30

## 2017-03-26 MED ORDER — VANCOMYCIN HCL IN DEXTROSE 1-5 GM/200ML-% IV SOLN
INTRAVENOUS | Status: AC
  Filled 2017-03-26: qty 200

## 2017-03-26 MED ORDER — FENTANYL CITRATE (PF) 100 MCG/2ML IJ SOLN
50.0000 ug | INTRAMUSCULAR | Status: AC | PRN
  Administered 2017-03-26 (×3): 50 ug via INTRAVENOUS

## 2017-03-26 MED ORDER — MEPERIDINE HCL 25 MG/ML IJ SOLN: 6.2500 mg | INTRAMUSCULAR | Status: DC | PRN

## 2017-03-26 MED ORDER — ONDANSETRON HCL 4 MG/2ML IJ SOLN
INTRAMUSCULAR | Status: DC | PRN
Start: 2017-03-26 — End: 2017-03-26
  Administered 2017-03-26: 4 mg via INTRAVENOUS

## 2017-03-26 MED ORDER — BUPIVACAINE-EPINEPHRINE (PF) 0.25% -1:200000 IJ SOLN
INTRAMUSCULAR | Status: DC | PRN
  Administered 2017-03-26: 30 mL

## 2017-03-26 MED ORDER — TECHNETIUM TC 99M SULFUR COLLOID FILTERED
1.0000 | Freq: Once | INTRAVENOUS | Status: AC | PRN
  Administered 2017-03-26: 1 via INTRADERMAL

## 2017-03-26 MED ORDER — VANCOMYCIN HCL IN DEXTROSE 1-5 GM/200ML-% IV SOLN
1000.0000 mg | INTRAVENOUS | Status: AC
  Administered 2017-03-26: 1000 mg via INTRAVENOUS

## 2017-03-26 MED ORDER — DEXAMETHASONE SODIUM PHOSPHATE 10 MG/ML IJ SOLN
INTRAMUSCULAR | Status: AC
  Filled 2017-03-26: qty 1

## 2017-03-26 SURGICAL SUPPLY — 48 items
ADH SKN CLS APL DERMABOND .7 (GAUZE/BANDAGES/DRESSINGS) ×1
APPLIER CLIP 9.375 MED OPEN (MISCELLANEOUS) ×2
APR CLP MED 9.3 20 MLT OPN (MISCELLANEOUS) ×1
BLADE SURG 15 STRL LF DISP TIS (BLADE) ×1 IMPLANT
BLADE SURG 15 STRL SS (BLADE) ×2
CANISTER SUC SOCK COL 7IN (MISCELLANEOUS) IMPLANT
CANISTER SUCT 1200ML W/VALVE (MISCELLANEOUS) ×1 IMPLANT
CHLORAPREP W/TINT 26ML (MISCELLANEOUS) ×2 IMPLANT
CLIP APPLIE 9.375 MED OPEN (MISCELLANEOUS) ×1 IMPLANT
COVER BACK TABLE 60X90IN (DRAPES) ×2 IMPLANT
COVER MAYO STAND STRL (DRAPES) ×2 IMPLANT
COVER PROBE W GEL 5X96 (DRAPES) ×2 IMPLANT
DECANTER SPIKE VIAL GLASS SM (MISCELLANEOUS) IMPLANT
DERMABOND ADVANCED (GAUZE/BANDAGES/DRESSINGS) ×1
DERMABOND ADVANCED .7 DNX12 (GAUZE/BANDAGES/DRESSINGS) ×1 IMPLANT
DEVICE DUBIN W/COMP PLATE 8390 (MISCELLANEOUS) ×2 IMPLANT
DRAPE LAPAROSCOPIC ABDOMINAL (DRAPES) ×2 IMPLANT
DRAPE UTILITY XL STRL (DRAPES) ×3 IMPLANT
ELECT COATED BLADE 2.86 ST (ELECTRODE) ×2 IMPLANT
ELECT REM PT RETURN 9FT ADLT (ELECTROSURGICAL) ×2
ELECTRODE REM PT RTRN 9FT ADLT (ELECTROSURGICAL) ×1 IMPLANT
GLOVE BIO SURGEON STRL SZ7.5 (GLOVE) ×2 IMPLANT
GLOVE BIOGEL PI IND STRL 7.0 (GLOVE) IMPLANT
GLOVE BIOGEL PI INDICATOR 7.0 (GLOVE) ×4
GLOVE ECLIPSE 6.5 STRL STRAW (GLOVE) ×1 IMPLANT
GLOVE SURG SS PI 7.5 STRL IVOR (GLOVE) ×2 IMPLANT
GOWN STRL REUS W/ TWL LRG LVL3 (GOWN DISPOSABLE) ×2 IMPLANT
GOWN STRL REUS W/TWL LRG LVL3 (GOWN DISPOSABLE) ×8
ILLUMINATOR WAVEGUIDE N/F (MISCELLANEOUS) IMPLANT
KIT MARKER MARGIN INK (KITS) ×2 IMPLANT
LIGHT WAVEGUIDE WIDE FLAT (MISCELLANEOUS) IMPLANT
NDL HYPO 25X1 1.5 SAFETY (NEEDLE) ×1 IMPLANT
NDL SAFETY ECLIPSE 18X1.5 (NEEDLE) IMPLANT
NEEDLE HYPO 18GX1.5 SHARP (NEEDLE)
NEEDLE HYPO 25X1 1.5 SAFETY (NEEDLE) ×2 IMPLANT
NS IRRIG 1000ML POUR BTL (IV SOLUTION) ×1 IMPLANT
PACK BASIN DAY SURGERY FS (CUSTOM PROCEDURE TRAY) ×2 IMPLANT
PENCIL BUTTON HOLSTER BLD 10FT (ELECTRODE) ×2 IMPLANT
SLEEVE SCD COMPRESS KNEE MED (MISCELLANEOUS) ×2 IMPLANT
SPONGE LAP 18X18 X RAY DECT (DISPOSABLE) ×2 IMPLANT
SUT MON AB 4-0 PC3 18 (SUTURE) ×3 IMPLANT
SUT SILK 2 0 SH (SUTURE) IMPLANT
SUT VICRYL 3-0 CR8 SH (SUTURE) ×2 IMPLANT
SYR CONTROL 10ML LL (SYRINGE) ×2 IMPLANT
TOWEL OR 17X24 6PK STRL BLUE (TOWEL DISPOSABLE) ×2 IMPLANT
TOWEL OR NON WOVEN STRL DISP B (DISPOSABLE) ×2 IMPLANT
TUBE CONNECTING 20X1/4 (TUBING) ×1 IMPLANT
YANKAUER SUCT BULB TIP NO VENT (SUCTIONS) ×1 IMPLANT

## 2017-03-26 NOTE — Progress Notes (Signed)
Emotional support during breast injections °

## 2017-03-26 NOTE — Anesthesia Postprocedure Evaluation (Signed)
Anesthesia Post Note  Patient: Mckenzie Webb  Procedure(s) Performed: Procedure(s) (LRB): BREAST LUMPECTOMY WITH RADIOACTIVE SEED AND SENTINEL LYMPH NODE BIOPSY (Right)     Patient location during evaluation: PACU Anesthesia Type: Regional and General Level of consciousness: sedated and patient cooperative Pain management: pain level controlled Vital Signs Assessment: post-procedure vital signs reviewed and stable Respiratory status: spontaneous breathing Cardiovascular status: stable Anesthetic complications: no    Last Vitals:  Vitals:   03/26/17 1315 03/26/17 1330  BP: 123/67 133/68  Pulse: 81 84  Resp: 13 13  Temp:      Last Pain:  Vitals:   03/26/17 1330  TempSrc:   PainSc: [place]

## 2017-03-26 NOTE — Anesthesia Procedure Notes (Addendum)
Anesthesia Regional Block: Pectoralis block   Pre-Anesthetic Checklist: ,, timeout performed, Correct Patient, Correct Site, Correct Laterality, Correct Procedure, Correct Position, site marked, Risks and benefits discussed,  Surgical consent,  Pre-op evaluation,  At surgeon's request and post-op pain management  Laterality: Right  Prep: chloraprep       Needles:   Needle Type: Stimiplex     Needle Length: 9cm      Additional Needles:   Procedures: ultrasound guided,,,,,,,,  Narrative:  Start time: 03/26/2017 10:17 AM End time: 03/26/2017 10:23 AM Injection made incrementally with aspirations every 5 mL.  Performed by: Personally  Anesthesiologist: Nolon Nations  Additional Notes: Patient tolerated well. Good fascial spread noted.

## 2017-03-26 NOTE — Anesthesia Preprocedure Evaluation (Signed)
Anesthesia Evaluation  Patient identified by MRN, date of birth, ID band Patient awake    Reviewed: Allergy & Precautions, NPO status , Patient's Chart, lab work & pertinent test results  History of Anesthesia Complications (+) PONV and history of anesthetic complications  Airway Mallampati: II  TM Distance: >3 FB Neck ROM: Full    Dental no notable dental hx.    Pulmonary neg pulmonary ROS,    Pulmonary exam normal breath sounds clear to auscultation       Cardiovascular hypertension, Normal cardiovascular exam Rhythm:Regular Rate:Normal     Neuro/Psych negative neurological ROS  negative psych ROS   GI/Hepatic Neg liver ROS, GERD  ,  Endo/Other  Hypothyroidism   Renal/GU negative Renal ROS     Musculoskeletal  (+) Arthritis ,   Abdominal   Peds  Hematology negative hematology ROS (+)   Anesthesia Other Findings   Reproductive/Obstetrics negative OB ROS                             Anesthesia Physical Anesthesia Plan  ASA: II  Anesthesia Plan: General and Regional   Post-op Pain Management: GA combined w/ Regional for post-op pain   Induction: Intravenous  PONV Risk Score and Plan: 4 or greater and Ondansetron, Dexamethasone, Midazolam, Scopolamine patch - Pre-op and Propofol infusion  Airway Management Planned: LMA  Additional Equipment:   Intra-op Plan:   Post-operative Plan: Extubation in OR  Informed Consent: I have reviewed the patients History and Physical, chart, labs and discussed the procedure including the risks, benefits and alternatives for the proposed anesthesia with the patient or authorized representative who has indicated his/her understanding and acceptance.   Dental advisory given  Plan Discussed with: CRNA  Anesthesia Plan Comments:         Anesthesia Quick Evaluation

## 2017-03-26 NOTE — Anesthesia Procedure Notes (Signed)
Procedure Name: LMA Insertion Date/Time: 03/26/2017 11:35 AM Performed by: Heidi Maclin D Pre-anesthesia Checklist: Patient identified, Emergency Drugs available, Suction available and Patient being monitored Patient Re-evaluated:Patient Re-evaluated prior to induction Oxygen Delivery Method: Circle system utilized Preoxygenation: Pre-oxygenation with 100% oxygen Induction Type: IV induction Ventilation: Mask ventilation without difficulty LMA: LMA inserted LMA Size: 3.0 Number of attempts: 1 Airway Equipment and Method: Bite block Placement Confirmation: positive ETCO2 Tube secured with: Tape Dental Injury: Teeth and Oropharynx as per pre-operative assessment

## 2017-03-26 NOTE — Op Note (Signed)
03/26/2017  12:49 PM  PATIENT:  Mckenzie Webb  71 y.o. female  PRE-OPERATIVE DIAGNOSIS:  RIGHT BREAST CANCER  POST-OPERATIVE DIAGNOSIS:  RIGHT BREAST CANCER  PROCEDURE:  Procedure(s): BREAST LUMPECTOMY WITH RADIOACTIVE SEED AND DEEP RIGHT AXILLARY SENTINEL LYMPH NODE BIOPSY (Right)  SURGEON:  Surgeon(s) and Role:    * Jovita Kussmaul, MD - Primary  PHYSICIAN ASSISTANT:   ASSISTANTS: none   ANESTHESIA:   local and general  EBL:  Total I/O In: 1000 [I.V.:1000] Out: -   BLOOD ADMINISTERED:none  DRAINS: none   LOCAL MEDICATIONS USED:  MARCAINE     SPECIMEN:  Source of Specimen:  right breast tissue and sentinel nodes X 2  DISPOSITION OF SPECIMEN:  PATHOLOGY  COUNTS:  YES  TOURNIQUET:  * No tourniquets in log *  DICTATION: .Dragon Dictation   After informed consent was obtained the patient was brought to the operating room and placed in the supine position on the operating table. After adequate induction of general anesthesia the patient's right chest, breast, and axillary area were prepped with ChloraPrep, allowed to dry, and draped in usual sterile manner. An appropriate timeout was performed. Previously an I-125 seed was placed in the subareolar right breast to mark an area of invasive breast cancer. Earlier in the day the patient underwent injection of 1 mCi of technetium sulfur colloid in the subareolar position on the right. Initially the neoprobe was set to technetium and an area of radioactivity was identified in the right axilla. A small transversely oriented incision was made overlying the area of radioactivity with a 15 blade knife. The incision was carried through the skin and subcutaneous tissue sharply with the electrocautery until the deep right axillary space was entered. Blunt hemostat dissection was then directed by the neoprobe until I was able to identify a hot lymph node. This lymph node was excised sharply with the electrocautery and the lymphatics were  clamped with hemostats, divided, and ligated with 3-0 Vicryl ties. Ex vivo counts on this node were approximately 250. There was an adjacent palpable lymph node and this was also excised sharply with electrocautery and the lymphatics were controlled with clips. There was no radioactivity in this node. It was sent as sentinel node #2. No other hot or palpable lymph nodes were identified. The wound was irrigated with saline and infiltrated with quarter percent Marcaine. The deep layer of the wound was then closed with interrupted 3-0 Vicryl stitches. The skin was then closed with a running 4-0 Monocryl subcuticular stitch. Attention was then turned to the right breast. The neoprobe was set to I-125 in the area of radioactivity was readily identified in the subareolar right breast just lateral to the nipple. The area around this was infiltrated with quarter percent Marcaine. A curvilinear incision was made on the lateral aspect of the areola with a 15 blade knife. The incision was carried through the skin and subcutaneous tissue sharply with the electrocautery. The dissection was carried superficially just behind the nipple. While checking the area of radioactivity frequently with the neoprobe a circular portion of breast tissue was excised sharply around the radioactive seed. Once the specimen was removed it was oriented with the appropriate painkillers. A specimen radiograph was obtained that showed the clip and seed to be near the center of the specimen. The specimen was then sent to pathology for further evaluation. Hemostasis was achieved using the Bovie electrocautery. The wound was irrigated with saline and infiltrated with quarter percent Marcaine. The deep layer  of the wound was then closed with layers of interrupted 3-0 Vicryl stitches. The cavity was marked with clips. The skin was closed with interrupted 4-0 Monocryl subcuticular stitches. Dermabond dressings were applied. The patient tolerated the  procedure well. At the end of the case all needle sponge and instrument counts were correct. The patient was then awakened and taken to recovery in stable condition. The patient elected prior to surgery to try to spare the nipple and areola given the location of the cancer. She understands that there is a possibility of needing to go back and remove the nipple and areola if her anterior margin is positive.  PLAN OF CARE: Discharge to home after PACU  PATIENT DISPOSITION:  PACU - hemodynamically stable.   Delay start of Pharmacological VTE agent (>24hrs) due to surgical blood loss or risk of bleeding: not applicable

## 2017-03-26 NOTE — Transfer of Care (Signed)
Immediate Anesthesia Transfer of Care Note  Patient: Mckenzie Webb  Procedure(s) Performed: Procedure(s): BREAST LUMPECTOMY WITH RADIOACTIVE SEED AND SENTINEL LYMPH NODE BIOPSY (Right)  Patient Location: PACU  Anesthesia Type:GA combined with regional for post-op pain  Level of Consciousness: awake and patient cooperative  Airway & Oxygen Therapy: Patient Spontanous Breathing and Patient connected to face mask oxygen  Post-op Assessment: Report given to RN and Post -op Vital signs reviewed and stable  Post vital signs: Reviewed and stable  Last Vitals:  Vitals:   03/26/17 1034 03/26/17 1035  BP:    Pulse: 83 83  Resp: 10 11  Temp:      Last Pain:  Vitals:   03/26/17 0939  TempSrc: Oral         Complications: No apparent anesthesia complications

## 2017-03-26 NOTE — Interval H&P Note (Signed)
History and Physical Interval Note:  03/26/2017 11:15 AM  Mckenzie Webb  has presented today for surgery, with the diagnosis of RIGHT BREAST CANCER  The various methods of treatment have been discussed with the patient and family. After consideration of risks, benefits and other options for treatment, the patient has consented to  Procedure(s): BREAST LUMPECTOMY WITH RADIOACTIVE SEED AND SENTINEL LYMPH NODE BIOPSY (Right) as a surgical intervention .  The patient's history has been reviewed, patient examined, no change in status, stable for surgery.  I have reviewed the patient's chart and labs.  Questions were answered to the patient's satisfaction.     TOTH III,PAUL S

## 2017-03-26 NOTE — H&P (Signed)
Mckenzie Webb  Location: [facility] Patient #: [id number] DOB: 27-Jul-1946 Unknown / Language: [name] / Race: White Female   History of Present Illness  The patient is a 71 year old female who presents with breast cancer. We are asked to see the patient in consultation by Dr. Isidore Moos to evaluate her for a new right breast cancer. The patient is a 71 year old white female who recently had a mammogram that showed a 83m subareolar mass. This was biopsied and came back as a grade I invasive ductal cancer. It was ER and PR + and Her2 - with a Ki67 of 5%. She denies any breast pain or discharge from the nipple. She does not take any hormone replacement.   Past Surgical History  Breast Biopsy  Right. Gallbladder Surgery - Laparoscopic  Knee Surgery  Bilateral. Oral Surgery   Diagnostic Studies History  Colonoscopy  5-10 years ago Mammogram  within last year Pap Smear  1-5 years ago  Medication History Medications Reconciled  Social History Alcohol use  Occasional alcohol use. Caffeine use  Carbonated beverages, Coffee, Tea. No drug use  Tobacco use  Never smoker.  Family History  Alcohol Abuse  Family Members In General. Arthritis  Family Members In General, Mother, Sister. Cancer  Family Members In General. Cerebrovascular Accident  Sister. Colon Cancer  Family Members In General. Diabetes Mellitus  Sister. Heart Disease  Mother. Malignant Neoplasm Of Pancreas  Father. Respiratory Condition  Sister. Thyroid problems  Mother.  Pregnancy / Birth History  Age of menopause  51-57 Contraceptive History  Oral contraceptives. Gravida  2 Irregular periods  Maternal age  69-25Para  2  Other Problems  Anxiety Disorder  Arthritis  Back Pain  Breast Cancer  Cholelithiasis  Gastroesophageal Reflux Disease  General anesthesia - complications  Hemorrhoids  High blood pressure  Lump In Breast  Thyroid Disease   Ulcerative Colitis     Review of Systems  General Not Present- Appetite Loss, Chills, Fatigue, Fever, Night Sweats, Weight Gain and Weight Loss. Skin Present- Dryness. Not Present- Change in Wart/Mole, Hives, Jaundice, New Lesions, Non-Healing Wounds, Rash and Ulcer. HEENT Present- Wears glasses/contact lenses. Not Present- Earache, Hearing Loss, Hoarseness, Nose Bleed, Oral Ulcers, Ringing in the Ears, Seasonal Allergies, Sinus Pain, Sore Throat, Visual Disturbances and Yellow Eyes. Respiratory Present- Snoring. Not Present- Bloody sputum, Chronic Cough, Difficulty Breathing and Wheezing. Breast Not Present- Breast Mass, Breast Pain, Nipple Discharge and Skin Changes. Cardiovascular Not Present- Chest Pain, Difficulty Breathing Lying Down, Leg Cramps, Palpitations, Rapid Heart Rate, Shortness of Breath and Swelling of Extremities. Gastrointestinal Present- Hemorrhoids. Not Present- Abdominal Pain, Bloating, Bloody Stool, Change in Bowel Habits, Chronic diarrhea, Constipation, Difficulty Swallowing, Excessive gas, Gets full quickly at meals, Indigestion, Nausea, Rectal Pain and Vomiting. Female Genitourinary Not Present- Frequency, Nocturia, Painful Urination, Pelvic Pain and Urgency. Musculoskeletal Present- Back Pain. Not Present- Joint Pain, Joint Stiffness, Muscle Pain, Muscle Weakness and Swelling of Extremities. Neurological Not Present- Decreased Memory, Fainting, Headaches, Numbness, Seizures, Tingling, Tremor, Trouble walking and Weakness. Psychiatric Present- Anxiety. Not Present- Bipolar, Change in Sleep Pattern, Depression, Fearful and Frequent crying. Hematology Present- Easy Bruising. Not Present- Blood Thinners, Excessive bleeding, Gland problems, HIV and Persistent Infections.   Physical Exam  General Mental Status-Alert. General Appearance-Consistent with stated age. Hydration-Well hydrated. Voice-Normal.  Head and Neck Head-normocephalic, atraumatic with no  lesions or palpable masses. Trachea-midline. Thyroid Gland Characteristics - normal size and consistency.  Eye Eyeball - Bilateral-Extraocular movements intact. Sclera/Conjunctiva -  Bilateral-No scleral icterus.  Chest and Lung Exam Chest and lung exam reveals -quiet, even and easy respiratory effort with no use of accessory muscles and on auscultation, normal breath sounds, no adventitious sounds and normal vocal resonance. Inspection Chest Wall - Normal. Back - normal.  Breast Note: There is no palpable mass other than a bruise behind the right nipple in either breast. There is no palpable axillary, supraclavicular, or cervical lymphadenopathy   Cardiovascular Cardiovascular examination reveals -normal heart sounds, regular rate and rhythm with no murmurs and normal pedal pulses bilaterally.  Abdomen Inspection Inspection of the abdomen reveals - No Hernias. Skin - Scar - no surgical scars. Palpation/Percussion Palpation and Percussion of the abdomen reveal - Soft, Non Tender, No Rebound tenderness, No Rigidity (guarding) and No hepatosplenomegaly. Auscultation Auscultation of the abdomen reveals - Bowel sounds normal.  Neurologic Neurologic evaluation reveals -alert and oriented x 3 with no impairment of recent or remote memory. Mental Status-Normal.  Musculoskeletal Normal Exam - Left-Upper Extremity Strength Normal and Lower Extremity Strength Normal. Normal Exam - Right-Upper Extremity Strength Normal and Lower Extremity Strength Normal.  Lymphatic Head & Neck  General Head & Neck Lymphatics: Bilateral - Description - Normal. Axillary  General Axillary Region: Bilateral - Description - Normal. Tenderness - Non Tender. Femoral & Inguinal  Generalized Femoral & Inguinal Lymphatics: Bilateral - Description - Normal. Tenderness - Non Tender.    Assessment & Plan  MALIGNANT NEOPLASM OF CENTRAL PORTION OF RIGHT BREAST IN FEMALE, ESTROGEN RECEPTOR  POSITIVE (C50.111) Impression: The patient appears to have a small stage I cancer of the retroareolar right breast. I have discussed with her the different options for treatment and at this point she favors breast conservation. She is also a good candidate for sentinel node mapping. She will require a central lumpectomy because of the location which means she will lose her nipple and areola. I have discussed with her the risks and benefits of the surgery as well as some of the technical aspects and she understands and wishes to proceed Current Plans Pt Education - Breast cancer: discussed with patient and provided information.

## 2017-03-26 NOTE — Progress Notes (Signed)
Assisted Dr. Lissa Hoard with right, ultrasound guided, pectoralis block. Side rails up, monitors on throughout procedure. See vital signs in flow sheet. Tolerated Procedure well.

## 2017-03-27 ENCOUNTER — Encounter (HOSPITAL_BASED_OUTPATIENT_CLINIC_OR_DEPARTMENT_OTHER): Payer: Self-pay | Admitting: General Surgery

## 2017-03-28 ENCOUNTER — Telehealth: Payer: Self-pay | Admitting: Adult Health

## 2017-03-28 NOTE — Telephone Encounter (Signed)
Patient called to cancel her survivorship visit.  She refused to reschedule at this time.

## 2017-03-31 ENCOUNTER — Telehealth: Payer: Self-pay | Admitting: *Deleted

## 2017-03-31 NOTE — Telephone Encounter (Signed)
Ordered mammaprint per DR. Magrinat.  Faxed requisition to pathology and confirmed receipt.  Faxed it also to [place].

## 2017-04-04 ENCOUNTER — Telehealth: Payer: Self-pay | Admitting: Genetic Counselor

## 2017-04-04 NOTE — Telephone Encounter (Signed)
Patient wants to schedule an appointment for a blood draw for genetic testing.  We scheduled her for August 20.

## 2017-04-07 ENCOUNTER — Other Ambulatory Visit: Payer: Medicare Other

## 2017-04-07 DIAGNOSIS — C50211 Malignant neoplasm of upper-inner quadrant of right female breast: Secondary | ICD-10-CM | POA: Diagnosis not present

## 2017-04-09 ENCOUNTER — Encounter (HOSPITAL_COMMUNITY): Payer: Self-pay

## 2017-04-10 ENCOUNTER — Ambulatory Visit (HOSPITAL_BASED_OUTPATIENT_CLINIC_OR_DEPARTMENT_OTHER): Payer: Medicare Other | Admitting: Oncology

## 2017-04-10 ENCOUNTER — Other Ambulatory Visit: Payer: Self-pay | Admitting: Oncology

## 2017-04-10 ENCOUNTER — Telehealth: Payer: Self-pay | Admitting: *Deleted

## 2017-04-10 VITALS — BP 179/78 | HR 75 | Temp 98.4°F | Resp 18 | Ht 64.0 in | Wt 170.8 lb

## 2017-04-10 DIAGNOSIS — Z170 Estrogen receptor positive status [ER+]: Secondary | ICD-10-CM | POA: Diagnosis not present

## 2017-04-10 DIAGNOSIS — C50211 Malignant neoplasm of upper-inner quadrant of right female breast: Secondary | ICD-10-CM | POA: Diagnosis not present

## 2017-04-10 NOTE — Progress Notes (Signed)
Breast - No Medical Intervention - Off Treatment.  Patient Characteristics: Postoperative without Neoadjuvant Therapy (Pathologic Staging), Invasive Disease, Adjuvant Therapy, HER2 Negative/Unknown/Equivocal, ER Positive, Node Positive, Node Positive (1-3), MammaPrint(R) Ordered, High Genomic Risk Therapeutic Status: Postoperative without Neoadjuvant Therapy (Pathologic Staging) AJCC Grade: G1 AJCC N Category: pN1 AJCC M Category: cM0 ER Status: Positive (+) AJCC 8 Stage Grouping: IA HER2 Status: Negative (-) Oncotype Dx Recurrence Score: Ordered Other Genomic Test AJCC T Category: pT1c PR Status: Positive (+) Has this patient completed genomic testing? Yes - MammaPrint(R) MammaPrint(R) Score: High Genomic Risk

## 2017-04-10 NOTE — Progress Notes (Signed)
[place]  Telephone:([phone] Fax:([phone]) [phone]     ID: Mckenzie Webb DOB: 03-08-1946  MR#: [id number]  ZWC#:[id number]  Patient Care Team: Rita Ohara, MD as PCP - General (Family Medicine) Jovita Kussmaul, MD as Consulting Physician (General Surgery) Myra Weng, Virgie Dad, MD as Consulting Physician (Oncology) Eppie Gibson, MD as Attending Physician (Radiation Oncology) Juanita Craver, MD as Consulting Physician (Gastroenterology) Vania Rea, MD as Consulting Physician (Obstetrics and Gynecology) Elsie Saas, MD as Consulting Physician (Orthopedic Surgery) Martinique, Amy, MD as Consulting Physician (Dermatology) Chauncey Cruel, MD OTHER MD:  CHIEF COMPLAINT: Estrogen receptor positive breast cancer  CURRENT TREATMENT: Adjuvant chemotherapy   BREAST CANCER HISTORY: From the original intake note:  "Mckenzie Webb" had bilateral screening mammography with tomography at the [facility] 02/21/2017. This showed a possible mass in the right breast. Right diagnostic mammography with tomography and ultrasonography 02/27/2017 found a breast density to be category B. In the subareolar right breast there was a 0.5 cm spiculated mass, which was not palpable. There was mild right nipple inversion, which per report was chronic. Ultrasonography confirmed a 0.5 cm retroareolar breast mass at the 1:00 radiant. The right axilla was sonographically benign.  On 03/03/2017 the patient underwent biopsy of the right breast mass in question. This showed ([id number]) and invasive ductal carcinoma, grade 1, estrogen receptor 100% positive, progesterone receptor negative, with an MIB-1 of 3%, and no HER-2 implication, the signals ratio being 1.25 and the number per cell 2.00.  The patient's subsequent history is as detailed below.  INTERVAL HISTORY: Mckenzie Webb returns today for follow-up and treatment of her estrogen receptor positive breast cancer accompanied by her husband. Since her last  visit here she underwent right lumpectomy and sentinel lymph node sampling, on 03/26/2017. The final pathology ([facility] 18-03/02/2007) showed a 1.2 cm invasive ductal carcinoma, grade 1, with ample margins. One of 2 sentinel lymph nodes were clear.  The case was presented at the multidisciplinary breast cancer conference 04/09/2017. At that point it was felt that a Mammaprint would help [place] confirm the likely expectation that she would not need chemotherapy. This would be followed by radiation on anastrozole.  However, the Mammaprint came back high risk. Given the fact that we are dealing with lymph node involvement in a very small tumor perhaps that should have been expected. It indicates the patient will benefit from chemotherapy and she is here to discuss that today  REVIEW OF SYSTEMS: She still has some soreness from the surgery, but there was no bleeding, fever, or major complication. She has good range of motion on the surgical side. She had some blood drawn 04/07/2017 for genetics testing and it has left a bruise on the dorsum of her left hand. A detailed review of systems today was otherwise noncontributory  PAST MEDICAL HISTORY: Past Medical History:  Diagnosis Date  . Allergy   . DJD (degenerative joint disease), cervical 04 2002  . Edema    resolved  . Family history of colon cancer   . Family history of pancreatic cancer   . GERD (gastroesophageal reflux disease)   . Hyperlipidemia   . Hypothyroid   . Interstitial cystitis   . Leukopenia   . PONV (postoperative nausea and vomiting)   . Psoriasis     PAST SURGICAL HISTORY: Past Surgical History:  Procedure Laterality Date  . BREAST LUMPECTOMY WITH RADIOACTIVE SEED AND SENTINEL LYMPH NODE BIOPSY Right 03/26/2017   Procedure: BREAST LUMPECTOMY WITH RADIOACTIVE SEED AND SENTINEL LYMPH NODE BIOPSY;  Surgeon: Jovita Kussmaul, MD;  Location: [facility];  Service: General;  Laterality: Right;  . CHOLECYSTECTOMY    .  COLONOSCOPY  3/09  . KNEE ARTHROSCOPY Right 1990  . KNEE ARTHROSCOPY Left 12/12/2010    (Dr. Noemi Chapel)  . TUBAL LIGATION  1983  . VARICOSE VEIN SURGERY  09/2009 R, 06/2010 L    FAMILY HISTORY Family History  Problem Relation Age of Onset  . Hypertension Mother   . Heart disease Mother   . Gallbladder disease Mother   . Thyroid disease Mother   . Arthritis Mother   . Diabetes Father   . Cancer Father        pancreatic  . Hypertension Sister   . Gallbladder disease Sister   . Diabetes Sister   . Diverticulitis Sister        of small intestine  . Cancer Son        testicular cancer  . Stroke Sister        late 30's  . Hypertension Sister   . Pulmonary embolism Sister   . Ulcerative colitis Sister   . COPD Brother        had lung lobe removed for suspected cancer, but was benign; smoker  . Colon cancer Other 28  . Lung cancer Cousin        maternal first cousin - smoker  . Breast cancer Neg Hx   The patient's father died from pancreatic cancer at the age of 68. The patient's mother died from a myocardial infarction at age 71. The patient had one brother, 3 sisters. The patient's older son was diagnosed with testicular cancer at age 6. A maternal niece was diagnosed with colon cancer at age 49 and a maternal cousin with lung cancer at an unknown age.  GYNECOLOGIC HISTORY:  No LMP recorded. Patient is postmenopausal.  Menarche age 54, first live birth age 54, the patient is [place] P2. she went through the change of life approximately the year 2000. She did not take hormone replacement. She did take oral contraceptives for more than 20 years remotely, with no complications.   SOCIAL HISTORY:  She is a retired Web designer. Her husband Mckenzie Webb is an Clinical biochemist, still working part-time. Son Mckenzie Webb (from the patient's first marriage) lives in [place] and works in Therapist, art. Son Mckenzie Webb (from patient second marriage) lives in [place] we are is Comptroller. The patient  has one grandchild. She is not a Ambulance person.    ADVANCED DIRECTIVES: Not in place   HEALTH MAINTENANCE: Social History  Substance Use Topics  . Smoking status: Never Smoker  . Smokeless tobacco: Never Used  . Alcohol use No     Comment: social, 1-2 times per year     Colonoscopy:2009/[facility] and  PAP:  [facility], T score -0.1 (normal)   Allergies  Allergen Reactions  . Iodine Swelling  . Sulfa Antibiotics Swelling    Lips swell, itching  . Penicillins Rash  . Polysporin [Bacitracin-Polymyxin B] Swelling    Used for eye infection--got redder and worse  . Ciprofloxacin     After she takes for a while it gives her a "funny feeling."  . Codeine Nausea Only    Current Outpatient Prescriptions  Medication Sig Dispense Refill  . anastrozole (ARIMIDEX) 1 MG tablet Take 1 tablet (1 mg total) by mouth daily. 90 tablet 4  . Calcium-Vitamin D (CALTRATE 600 PLUS-VIT D PO) Take 1 tablet by mouth daily.      Marland Kitchen  Cholecalciferol (VITAMIN D) 1000 UNITS capsule Take 1,000 Units by mouth daily.      Marland Kitchen co-enzyme Q-10 30 MG capsule Take 100 mg by mouth daily.      . Cyanocobalamin (VITAMIN B 12 PO) Take 1,000 mcg by mouth.     . diclofenac (VOLTAREN) 75 MG EC tablet Take 75 mg by mouth 2 (two) times daily.    . fish oil-omega-3 fatty acids 1000 MG capsule Take 2 g by mouth daily.     Marland Kitchen levothyroxine (SYNTHROID, LEVOTHROID) 88 MCG tablet Take 1 tablet (88 mcg total) by mouth daily. 90 tablet 3  . loratadine (CLARITIN) 10 MG tablet Take 10 mg by mouth daily.    Marland Kitchen losartan-hydrochlorothiazide (HYZAAR) 50-12.5 MG tablet TAKE ONE (1) TABLET BY MOUTH EVERY DAY 90 tablet 3  . Multiple Vitamins-Minerals (ALIVE WOMENS 50+ PO) Take by mouth.    Marland Kitchen omeprazole (PRILOSEC) 40 MG capsule TAKE ONE (1) CAPSULE EACH DAY 90 capsule 2  . Red Yeast Rice Extract (RED YEAST RICE PO) Take 4 tablets by mouth daily.      Marland Kitchen triamcinolone cream (KENALOG) 0.1 % Apply 1 application  topically 2 (two) times daily. 45 g 2   No current facility-administered medications for this visit.     OBJECTIVE: Middle-aged white woman Who appears well  Vitals:   04/10/17 1428  BP: (!) 179/78  Pulse: 75  Resp: 18  Temp: 98.4 F (36.9 C)  SpO2: 100%     Body mass index is 29.32 kg/m.   Wt Readings from Last 3 Encounters:  04/10/17 170 lb 12.8 oz (77.5 kg)  03/26/17 172 lb 8 oz (78.2 kg)  03/12/17 174 lb 4.8 oz (79.1 kg)      ECOG FS:1 - Symptomatic but completely ambulatory  Sclerae unicteric, pupils round and equal Oropharynx clear and moist No cervical or supraclavicular adenopathy Lungs no rales or rhonchi Heart regular rate and rhythm Abd soft, nontender, positive bowel sounds MSK no focal spinal tenderness, no upper extremity lymphedema Neuro: nonfocal, well oriented, appropriate affect Breasts: The right breast is status post recent lumpectomy. The cosmetic result is excellent. There is no erythema, swelling, or dehiscence. Left breast is unremarkable. Both axillae are benign.   LAB RESULTS:  CMP     Component Value Date/Time   NA 142 03/12/2017 1225   K 4.0 03/12/2017 1225   CL 100 07/31/2016 0845   CO2 27 03/12/2017 1225   GLUCOSE 100 03/12/2017 1225   BUN 14.1 03/12/2017 1225   CREATININE 0.9 03/12/2017 1225   CALCIUM 9.5 03/12/2017 1225   PROT 7.5 03/12/2017 1225   ALBUMIN 3.9 03/12/2017 1225   AST 23 03/12/2017 1225   ALT 31 03/12/2017 1225   ALKPHOS 58 03/12/2017 1225   BILITOT 0.49 03/12/2017 1225    No results found for: TOTALPROTELP, ALBUMINELP, A1GS, A2GS, BETS, BETA2SER, GAMS, MSPIKE, SPEI  No results found for: Nils Pyle, [facility]  Lab Results  Component Value Date   WBC 4.9 03/12/2017   NEUTROABS 2.7 03/12/2017   HGB 14.0 03/12/2017   HCT 42.1 03/12/2017   MCV 95.5 03/12/2017   PLT 209 03/12/2017      Chemistry      Component Value Date/Time   NA 142 03/12/2017 1225   K 4.0 03/12/2017 1225   CL 100  07/31/2016 0845   CO2 27 03/12/2017 1225   BUN 14.1 03/12/2017 1225   CREATININE 0.9 03/12/2017 1225      Component Value Date/Time  CALCIUM 9.5 03/12/2017 1225   ALKPHOS 58 03/12/2017 1225   AST 23 03/12/2017 1225   ALT 31 03/12/2017 1225   BILITOT 0.49 03/12/2017 1225       No results found for: LABCA2  No components found for: OZYYQM250  No results for input(s): INR in the last 168 hours.  Urinalysis    Component Value Date/Time   BILIRUBINUR neg 08/17/2014 1123   PROTEINUR neg 08/17/2014 1123   UROBILINOGEN negative 08/17/2014 1123   NITRITE neg 08/17/2014 1123   LEUKOCYTESUR small (1+) 08/17/2014 1123     STUDIES: Mm Breast Surgical Specimen  Result Date: 03/26/2017 CLINICAL DATA:  Status post lumpectomy today after earlier radioactive seed localization. EXAM: SPECIMEN RADIOGRAPH OF THE RIGHT BREAST COMPARISON:  Previous exam(s). FINDINGS: Status post excision of the right breast. The radioactive seed and biopsy marker clip are present, completely intact, and were marked for pathology. The positions of the radioactive seed and biopsy marker clip within the specimen were discussed with the OR staff during the procedure. IMPRESSION: Specimen radiograph of the right breast. Electronically Signed   By: Franki Cabot M.D.   On: 03/26/2017 12:36   Mm Rt Radioactive Seed Loc Mammo Guide  Result Date: 03/25/2017 CLINICAL DATA:  Patient with right breast cancer scheduled for breast conservation surgery requiring preoperative radioactive seed localization. EXAM: MAMMOGRAPHIC GUIDED RADIOACTIVE SEED LOCALIZATION OF THE RIGHT BREAST COMPARISON:  Previous exam(s). FINDINGS: Patient presents for radioactive seed localization prior to breast conservation surgery. I met with the patient and we discussed the procedure of seed localization including benefits and alternatives. We discussed the high likelihood of a successful procedure. We discussed the risks of the procedure including  infection, bleeding, tissue injury and further surgery. We discussed the low dose of radioactivity involved in the procedure. Informed, written consent was given. The usual time-out protocol was performed immediately prior to the procedure. Using mammographic guidance, sterile technique, 1% lidocaine and an I-125 radioactive seed, ribbon shaped clip was localized using a medial approach. The follow-up mammogram images confirm the seed in the expected location and were marked for Dr. Marlou Starks. Follow-up survey of the patient confirms presence of the radioactive seed. Order number of I-125 seed:  [id number]. Total activity:  1.694 millicuries  Reference Date: 03/06/2017 The patient tolerated the procedure well and was released from the [facility]. She was given instructions regarding seed removal. IMPRESSION: Radioactive seed localization right breast. No apparent complications. Electronically Signed   By: Franki Cabot M.D.   On: 03/25/2017 15:30    ELIGIBLE FOR AVAILABLE RESEARCH PROTOCOL: no  ASSESSMENT: 71 y.o. [place] woman status post right breast upper inner quadrant biopsy 03/04/2015 for a clinical T1a N0, stage IA invasive ductal carcinoma, grade 1, estrogen receptor positive, progesterone receptor negative, with an MIB-1 of 3% and no HER-2 amplification  (1) anastrozole started neoadjuvantly 03/12/2017 in anticipation of possible surgical delays   (2) genetics testing 04/07/2017-- results pending  (3) right lumpectomy and sentinel lymph node biopsy 03/26/2017 showed a pT1c pN1, stage IB invasive ductal carcinoma, grade 1, with negative margins  (4) Mammaprint returned high risk, indicating need for adjuvant chemotherapy  (5) chemotherapy will consist of Cytoxan and docetaxel given every 3 weeks 4 beginning on  (6) adjuvant radiation to follow   (7) continue anti-estrogens to a total of 5 years.    PLAN: I spent approximately one hour with Mckenzie Webb and her husband going over her  situation. She understands that her cancer, while small, and while apparently  not so aggressive, turned out to be dangerous. We are thankful to have the Mammaprint study to give [place] that information.  It tells [place] that if all she has as local treatment she has a nearly 30% chance of this tumor coming back.   Starting from that number, since chemotherapy generally reduce this risk by about one third, receiving chemotherapy should drop her risk by about 10%.  The anti-estrogens then will cut the residual 20% down by half. This means she will end up with a 10% or so risk of recurrence or to put it positively she will have a 90% chance of this cancer not coming back in the future.  Unfortunately she will have to work a little harder than we anticipated to get those good results. We discussed 5 months of chemotherapy, which would be standard, versus what I proposed, which is 3 months, namely cyclophosphamide and docetaxel given every 3 weeks 4.  She has very poor venous access. She has a bruise in the dorsum of her left hand where they obtained the blood for genetics testing and week ago. I think she would benefit from a port.  We did discuss the possible toxicities side effects and complications of this chemotherapy including the rare possibility of permanent hair loss and the possibility of permanent damage to nerve tips with resulting peripheral neuropathy.  She has a trip scheduled between August 26 and September 2. We are going to shoot for September 10 2 start her chemotherapy. I will see her September 5 to go over her supportive medications and she will see the chemotherapy teaching nurse on the same day.  Mckenzie Webb has a good understanding of the overall plan. She agrees with it. She knows a goal of treatment in her case is cure. She will call with any problems that may develop before her next visit.    Chauncey Cruel, MD   04/10/2017 4:23 PM Medical Oncology and Hematology [facility] [address] [place], [place] [address] Tel. [phone]    Fax. [phone]

## 2017-04-10 NOTE — Progress Notes (Signed)
DISCONTINUE OFF PATHWAY REGIMEN - Breast  No Medical Intervention - Off Treatment.  REASON: Other Reason PRIOR TREATMENT: Off Treatment  START OFF PATHWAY REGIMEN - Breast   OFF00004:Docetaxel + Cyclophosphamide (TC):   A cycle is every 21 days:     Docetaxel      Cyclophosphamide   **Always confirm dose/schedule in your pharmacy ordering system**    Patient Characteristics: Postoperative without Neoadjuvant Therapy (Pathologic Staging), Invasive Disease, Adjuvant Therapy, HER2 Negative/Unknown/Equivocal, ER Positive, Node Positive, Node Positive (1-3), MammaPrint(R) Ordered, High Genomic Risk Therapeutic Status: Postoperative without Neoadjuvant Therapy (Pathologic Staging) AJCC Grade: G1 AJCC N Category: pN1 AJCC M Category: cM0 ER Status: Positive (+) AJCC 8 Stage Grouping: IA HER2 Status: Negative (-) Oncotype Dx Recurrence Score: Ordered Other Genomic Test AJCC T Category: pT1c PR Status: Positive (+) Has this patient completed genomic testing? Yes - MammaPrint(R) MammaPrint(R) Score: High Genomic Risk Intent of Therapy: Curative Intent, Discussed with Patient

## 2017-04-10 NOTE — Telephone Encounter (Signed)
Received Mammaprint result of [place] Risk. Physician team notified. Called pt with results and scheduled appt for 04/10/17 at 2:15pm to discuss results and treatment options.

## 2017-04-10 NOTE — Progress Notes (Unsigned)
[place]  [phone] [phone]     ID: [name] DOB: 1945-10-09  MR#: [id number]  VHQ#:[id number]  Patient Care Team: Rita Ohara, MD as PCP - General (Family Medicine) Jovita Kussmaul, MD as Consulting Physician (General Surgery) Magrinat, Virgie Dad, MD as Consulting Physician (Oncology) Eppie Gibson, MD as Attending Physician (Radiation Oncology) Juanita Craver, MD as Consulting Physician (Gastroenterology) Vania Rea, MD as Consulting Physician (Obstetrics and Gynecology) Elsie Saas, MD as Consulting Physician (Orthopedic Surgery) Martinique, Amy, MD as Consulting Physician (Dermatology) Chauncey Cruel, MD OTHER MD:  CHIEF COMPLAINT: Estrogen receptor positive breast cancer  CURRENT TREATMENT: Anastrozole, awaiting definitive surgery   BREAST CANCER HISTORY: "Mckenzie Webb" had bilateral screening mammography with tomography at the [facility] 02/21/2017. This showed a possible mass in the right breast. Right diagnostic mammography with tomography and ultrasonography 02/27/2017 found a breast density to be category B. In the subareolar right breast there was a 0.5 cm spiculated mass, which was not palpable. There was mild right nipple inversion, which per report was chronic. Ultrasonography confirmed a 0.5 cm retroareolar breast mass at the 1:00 radiant. The right axilla was sonographically benign.  On 03/03/2017 the patient underwent biopsy of the right breast mass in question. This showed (SAA [id number]) and invasive ductal carcinoma, grade 1, estrogen receptor 100% positive, progesterone receptor negative, with an MIB-1 of 3%, and no HER-2 implication, the signals ratio being 1.25 and the number per cell 2.00.  The patient's subsequent history is as detailed below.  INTERVAL HISTORY: Mckenzie Webb was evaluated in the multidisciplinary breast cancer conference 03/12/2017 accompanied by her husband Mckenzie Webb and her son Mckenzie Webb. Her case was also  presented in the multidisciplinary breast cancer conference that same morning. At that time a preliminary plan was proposed: Genetics testing, breast conserving surgery, with possible central lumpectomy, with sentinel lymph node sampling; likely no chemotherapy; adjuvant radiation followed by hormones.  REVIEW OF SYSTEMS: There were no specific symptoms leading to the original mammogram, which was routinely scheduled. The patient denies unusual headaches, visual changes, nausea, vomiting, stiff neck, dizziness, or gait imbalance. There has been no cough, phlegm production, or pleurisy, no chest pain or pressure, and no change in bowel or bladder habits. The patient denies fever, rash, bleeding, unexplained fatigue or unexplained weight loss. A detailed review of systems was otherwise entirely negative.   PAST MEDICAL HISTORY: Past Medical History:  Diagnosis Date  . Allergy   . DJD (degenerative joint disease), cervical 04 2002  . Edema    resolved  . Family history of colon cancer   . Family history of pancreatic cancer   . GERD (gastroesophageal reflux disease)   . Hyperlipidemia   . Hypothyroid   . Interstitial cystitis   . Leukopenia   . PONV (postoperative nausea and vomiting)   . Psoriasis     PAST SURGICAL HISTORY: Past Surgical History:  Procedure Laterality Date  . BREAST LUMPECTOMY WITH RADIOACTIVE SEED AND SENTINEL LYMPH NODE BIOPSY Right 03/26/2017   Procedure: BREAST LUMPECTOMY WITH RADIOACTIVE SEED AND SENTINEL LYMPH NODE BIOPSY;  Surgeon: Jovita Kussmaul, MD;  Location: [facility];  Service: General;  Laterality: Right;  . CHOLECYSTECTOMY    . COLONOSCOPY  3/09  . KNEE ARTHROSCOPY Right 1990  . KNEE ARTHROSCOPY Left 12/12/2010    (Dr. Noemi Chapel)  . TUBAL LIGATION  1983  . VARICOSE VEIN SURGERY  09/2009 R, 06/2010 L    FAMILY HISTORY Family History  Problem Relation Age of Onset  .  Hypertension Mother   . Heart disease Mother   . Gallbladder disease  Mother   . Thyroid disease Mother   . Arthritis Mother   . Diabetes Father   . Cancer Father        pancreatic  . Hypertension Sister   . Gallbladder disease Sister   . Diabetes Sister   . Diverticulitis Sister        of small intestine  . Cancer Son        testicular cancer  . Stroke Sister        late 37's  . Hypertension Sister   . Pulmonary embolism Sister   . Ulcerative colitis Sister   . COPD Brother        had lung lobe removed for suspected cancer, but was benign; smoker  . Colon cancer Other 48  . Lung cancer Cousin        maternal first cousin - smoker  . Breast cancer Neg Hx   The patient's father died from pancreatic cancer at the age of 22. The patient's mother died from a myocardial infarction at age 58. The patient had one brother, 3 sisters. The patient's older son was diagnosed with testicular cancer at age 67. A maternal niece was diagnosed with colon cancer at age 21 and a maternal cousin with lung cancer at an unknown age.  GYNECOLOGIC HISTORY:  No LMP recorded. Patient is postmenopausal.  Menarche age 71, first live birth age 19, the patient is [place] P2. she went through the change of life approximately the year 2000. She did not take hormone replacement. She did take oral contraceptives for more than 20 years remotely, with no complications.   SOCIAL HISTORY:  She is a retired Web designer. Her husband Mckenzie Webb is an Clinical biochemist, still working part-time. Son Corene Cornea (from the patient's first marriage) lives in [place] and works in Therapist, art. Son Mckenzie Webb (from patient second marriage) lives in [place] we are is Comptroller. The patient has one grandchild. She is not a Ambulance person.    ADVANCED DIRECTIVES: Not in place   HEALTH MAINTENANCE: Social History  Substance Use Topics  . Smoking status: Never Smoker  . Smokeless tobacco: Never Used  . Alcohol use No     Comment: social, 1-2 times per year     Colonoscopy:2009/[facility]  and  PAP:  [facility] T score -0.1 (normal)   Allergies  Allergen Reactions  . Iodine Swelling  . Sulfa Antibiotics Swelling    Lips swell, itching  . Penicillins Rash  . Polysporin [Bacitracin-Polymyxin B] Swelling    Used for eye infection--got redder and worse  . Ciprofloxacin     After she takes for a while it gives her a "funny feeling."  . Codeine Nausea Only    Current Outpatient Prescriptions  Medication Sig Dispense Refill  . anastrozole (ARIMIDEX) 1 MG tablet Take 1 tablet (1 mg total) by mouth daily. 90 tablet 4  . Calcium-Vitamin D (CALTRATE 600 PLUS-VIT D PO) Take 1 tablet by mouth daily.      . Cholecalciferol (VITAMIN D) 1000 UNITS capsule Take 1,000 Units by mouth daily.      Marland Kitchen co-enzyme Q-10 30 MG capsule Take 100 mg by mouth daily.      . Cyanocobalamin (VITAMIN B 12 PO) Take 1,000 mcg by mouth.     . diclofenac (VOLTAREN) 75 MG EC tablet Take 75 mg by mouth 2 (two) times daily.    Marland Kitchen  fish oil-omega-3 fatty acids 1000 MG capsule Take 2 g by mouth daily.     Marland Kitchen HYDROcodone-acetaminophen (NORCO/VICODIN) 5-325 MG tablet Take 1-2 tablets by mouth every 4 (four) hours as needed for moderate pain or severe pain. 15 tablet 0  . levothyroxine (SYNTHROID, LEVOTHROID) 88 MCG tablet Take 1 tablet (88 mcg total) by mouth daily. 90 tablet 3  . loratadine (CLARITIN) 10 MG tablet Take 10 mg by mouth daily.    Marland Kitchen losartan-hydrochlorothiazide (HYZAAR) 50-12.5 MG tablet TAKE ONE (1) TABLET BY MOUTH EVERY DAY 90 tablet 3  . Multiple Vitamins-Minerals (ALIVE WOMENS 50+ PO) Take by mouth.    Marland Kitchen omeprazole (PRILOSEC) 40 MG capsule TAKE ONE (1) CAPSULE EACH DAY 90 capsule 2  . Red Yeast Rice Extract (RED YEAST RICE PO) Take 4 tablets by mouth daily.      . traMADol (ULTRAM) 50 MG tablet Take 1-2 tablets (50-100 mg total) by mouth every 6 (six) hours as needed. 30 tablet 0  . triamcinolone cream (KENALOG) 0.1 % Apply 1 application topically 2 (two) times  daily. 45 g 2   No current facility-administered medications for this visit.     OBJECTIVE: Middle-aged white woman in no acute distress  There were no vitals filed for this visit.   There is no height or weight on file to calculate BMI.   Wt Readings from Last 3 Encounters:  03/26/17 172 lb 8 oz (78.2 kg)  03/12/17 174 lb 4.8 oz (79.1 kg)  01/23/17 179 lb 12.8 oz (81.6 kg)      ECOG FS:0 - Asymptomatic  Ocular: Sclerae unicteric, pupils round and equal Ear-nose-throat: Oropharynx clear and moist Lymphatic: No cervical or supraclavicular adenopathy Lungs no rales or rhonchi Heart regular rate and rhythm Abd soft, nontender, positive bowel sounds MSK no focal spinal tenderness, no joint edema Neuro: non-focal, well-oriented, appropriate affect Breasts: The right breast is status post recent biopsy. There is a significant ecchymosis. There is no palpable mass. Left breast is benign. Both axillae are benign.   LAB RESULTS:  CMP     Component Value Date/Time   NA 142 03/12/2017 1225   K 4.0 03/12/2017 1225   CL 100 07/31/2016 0845   CO2 27 03/12/2017 1225   GLUCOSE 100 03/12/2017 1225   BUN 14.1 03/12/2017 1225   CREATININE 0.9 03/12/2017 1225   CALCIUM 9.5 03/12/2017 1225   PROT 7.5 03/12/2017 1225   ALBUMIN 3.9 03/12/2017 1225   AST 23 03/12/2017 1225   ALT 31 03/12/2017 1225   ALKPHOS 58 03/12/2017 1225   BILITOT 0.49 03/12/2017 1225    No results found for: TOTALPROTELP, ALBUMINELP, A1GS, A2GS, BETS, BETA2SER, GAMS, MSPIKE, SPEI  No results found for: Nils Pyle, [facility]  Lab Results  Component Value Date   WBC 4.9 03/12/2017   NEUTROABS 2.7 03/12/2017   HGB 14.0 03/12/2017   HCT 42.1 03/12/2017   MCV 95.5 03/12/2017   PLT 209 03/12/2017      Chemistry      Component Value Date/Time   NA 142 03/12/2017 1225   K 4.0 03/12/2017 1225   CL 100 07/31/2016 0845   CO2 27 03/12/2017 1225   BUN 14.1 03/12/2017 1225   CREATININE 0.9  03/12/2017 1225      Component Value Date/Time   CALCIUM 9.5 03/12/2017 1225   ALKPHOS 58 03/12/2017 1225   AST 23 03/12/2017 1225   ALT 31 03/12/2017 1225   BILITOT 0.49 03/12/2017 1225  No results found for: LABCA2  No components found for: [id number]  No results for input(s): INR in the last 168 hours.  Urinalysis    Component Value Date/Time   BILIRUBINUR neg 08/17/2014 1123   PROTEINUR neg 08/17/2014 1123   UROBILINOGEN negative 08/17/2014 1123   NITRITE neg 08/17/2014 1123   LEUKOCYTESUR small (1+) 08/17/2014 1123     STUDIES: Mm Breast Surgical Specimen  Result Date: 03/26/2017 CLINICAL DATA:  Status post lumpectomy today after earlier radioactive seed localization. EXAM: SPECIMEN RADIOGRAPH OF THE RIGHT BREAST COMPARISON:  Previous exam(s). FINDINGS: Status post excision of the right breast. The radioactive seed and biopsy marker clip are present, completely intact, and were marked for pathology. The positions of the radioactive seed and biopsy marker clip within the specimen were discussed with the OR staff during the procedure. IMPRESSION: Specimen radiograph of the right breast. Electronically Signed   By: Franki Cabot M.D.   On: 03/26/2017 12:36   Mm Rt Radioactive Seed Loc Mammo Guide  Result Date: 03/25/2017 CLINICAL DATA:  Patient with right breast cancer scheduled for breast conservation surgery requiring preoperative radioactive seed localization. EXAM: MAMMOGRAPHIC GUIDED RADIOACTIVE SEED LOCALIZATION OF THE RIGHT BREAST COMPARISON:  Previous exam(s). FINDINGS: Patient presents for radioactive seed localization prior to breast conservation surgery. I met with the patient and we discussed the procedure of seed localization including benefits and alternatives. We discussed the high likelihood of a successful procedure. We discussed the risks of the procedure including infection, bleeding, tissue injury and further surgery. We discussed the low dose of  radioactivity involved in the procedure. Informed, written consent was given. The usual time-out protocol was performed immediately prior to the procedure. Using mammographic guidance, sterile technique, 1% lidocaine and an I-125 radioactive seed, ribbon shaped clip was localized using a medial approach. The follow-up mammogram images confirm the seed in the expected location and were marked for Dr. Marlou Starks. Follow-up survey of the patient confirms presence of the radioactive seed. Order number of I-125 seed:  [id number]. Total activity:  6.599 millicuries  Reference Date: 03/06/2017 The patient tolerated the procedure well and was released from the [facility]. She was given instructions regarding seed removal. IMPRESSION: Radioactive seed localization right breast. No apparent complications. Electronically Signed   By: Franki Cabot M.D.   On: 03/25/2017 15:30    ELIGIBLE FOR AVAILABLE RESEARCH PROTOCOL: no  ASSESSMENT: 71 y.o. Mckenzie Webb woman status post right breast upper inner quadrant biopsy 03/04/2015 for a clinical T1a N0, stage IA invasive ductal carcinoma, grade 1, estrogen receptor positive, progesterone receptor negative, with an MIB-1 of 3% and no HER-2 amplification  (1) anastrozole started neoadjuvantly 03/12/2017 in anticipation of possible surgical delays   (2) genetics testing 03/17/2017  (3) breast conserving surgery with sentinel lymph node sampling pending  (4) adjuvant radiation  (5) continue anti-estrogens to a total of 5 years.    PLAN: We spent the better part of today's hour-long appointment discussing the biology of breast cancer in general, and the specifics of the patient's tumor in particular.We first reviewed the fact that cancer is not one disease but more than 100 different diseases and that it is important to keep them separate-- otherwise when friends and relatives discuss their own cancer experiences with Baker Janus confusion can result. Similarly we explained that if  breast cancer spreads to the bone or liver, the patient would not have bone cancer or liver cancer, but breast cancer in the bone and breast cancer in the liver: one cancer  in three places-- not 3 different cancers which otherwise would have to be treated in 3 different ways.  We discussed the difference between local and systemic therapy. In terms of loco-regional treatment, lumpectomy plus radiation is equivalent to mastectomy as far as survival is concerned. For this reason, and because the cosmetic results are generally superior, we recommend breast conserving surgery. We also noted that in terms of sequencing of treatments, whether systemic therapy or surgery is done first does not affect the ultimate outcome this is relevant to her own situation.  We then discussed the rationale for systemic therapy. There is some risk that this cancer may have already spread to other parts of her body. Patients frequently ask at this point about bone scans, CAT scans and PET scans to find out if they have occult breast cancer somewhere else. The problem is that in early stage disease we are much more likely to find false positives then true cancers and this would expose the patient to unnecessary procedures as well as unnecessary radiation. Scans cannot answer the question the patient really would like to know, which is whether she has microscopic disease elsewhere in her body. For those reasons we do not recommend them.  Of course we would proceed to aggressive evaluation of any symptoms that might suggest metastatic disease, but that is not the case here.  Next we went over the options for systemic therapy which are anti-estrogens, anti-HER-2 immunotherapy, and chemotherapy. Baker Janus does not meet criteria for anti-HER-2 immunotherapy. She is a good candidate for anti-estrogens.  The question of chemotherapy is more complicated. Chemotherapy is most effective in rapidly growing, aggressive tumors. It is much less  effective in low-grade, slow growing cancers, like Baker Janus 's. Accordingly we do not anticipate that chemotherapy will be useful or needed as we expect her to have a very good prognosis even without that form of systemic treatment. We would only consider an Oncotype if there is a significant difference between the pathology out, after surgery and the clinical impression at present  We then discussed genetics. In patients who carry a deleterious mutation [for example in a  BRCA gene], the risk of a new breast cancer developing in the future may be sufficiently great that the patient may choose bilateral mastectomies. However if she wishes to keep her breasts in that situation it is safe to do so. That would require intensified screening, which generally means not only yearly mammography but a yearly breast MRI as well. Of course, if there is a deleterious mutation bilateral oophorectomy would be necessary as there is no standard screening protocol for ovarian cancer.  Today Baker Janus could not decide whether or not she would want bilateral mastectomies in case she carries a deleterious mutation. Accordingly it is prudent to wait on her definitive surgery until the genetics results RN. Because that may involve delays (particularly if she does choose bilateral mastectomies and reconstruction) we discussed starting anti-estrogens at present. We specifically discussed anastrozole, and she is aware of the possible toxicities side effects and complications of this agent.  I went ahead and placed the prescription in for her. She will start anastrozole now. She may continue it right through her surgery and radiation as appropriate so long as she is tolerating it well.   I expect to see her sometime after she completes her radiation treatments. At that time we can either change to a different antiestrogen or continue the current one if she is tolerating it well  Baker Janus has a  good understanding of the overall plan. She agrees  with it. She knows the goal of treatment in her case is cure. She will call with any problems that may develop before her next visit here.  Chauncey Cruel, MD   04/10/2017 9:40 AM Medical Oncology and Hematology [facility] [address] [place], [place] [address] Tel. [phone]    Fax. [phone]

## 2017-04-11 ENCOUNTER — Ambulatory Visit: Payer: Self-pay | Admitting: General Surgery

## 2017-04-17 ENCOUNTER — Ambulatory Visit: Payer: Self-pay | Admitting: Genetic Counselor

## 2017-04-17 ENCOUNTER — Encounter: Payer: Self-pay | Admitting: Genetic Counselor

## 2017-04-17 DIAGNOSIS — Z1379 Encounter for other screening for genetic and chromosomal anomalies: Secondary | ICD-10-CM

## 2017-04-17 DIAGNOSIS — Z170 Estrogen receptor positive status [ER+]: Secondary | ICD-10-CM

## 2017-04-17 DIAGNOSIS — Z800 Family history of malignant neoplasm of digestive organs: Secondary | ICD-10-CM

## 2017-04-17 DIAGNOSIS — C50211 Malignant neoplasm of upper-inner quadrant of right female breast: Secondary | ICD-10-CM

## 2017-04-17 NOTE — Progress Notes (Signed)
HPI: Mckenzie Webb was previously seen in the [facility] clinic due to a personal and family history of cancer and concerns regarding a hereditary predisposition to cancer. Please refer to our prior cancer genetics clinic note for more information regarding Mckenzie Webb's medical, social and family histories, and our assessment and recommendations, at the time. Mckenzie Webb recent genetic test results were disclosed to her, as were recommendations warranted by these results. These results and recommendations are discussed in more detail below.  CANCER HISTORY:    Malignant neoplasm of upper-inner quadrant of right breast in female, estrogen receptor positive ([place])   03/11/2017 Initial Diagnosis    Malignant neoplasm of upper-inner quadrant of right breast in female, estrogen receptor positive ([place])     04/12/2017 Genetic Testing    Negative genetic testing on the common hereditary cancer panel.  The Hereditary Gene Panel offered by [facility] includes sequencing and/or deletion duplication testing of the following 46 genes: APC, ATM, AXIN2, BARD1, BMPR1A, BRCA1, BRCA2, BRIP1, CDH1, CDKN2A (p14ARF), CDKN2A (p16INK4a), CHEK2, CTNNA1, DICER1, EPCAM (Deletion/duplication testing only), GREM1 (promoter region deletion/duplication testing only), KIT, MEN1, MLH1, MSH2, MSH3, MSH6, MUTYH, NBN, NF1, NHTL1, PALB2, PDGFRA, PMS2, POLD1, POLE, PTEN, RAD50, RAD51C, RAD51D, SDHB, SDHC, SDHD, SMAD4, SMARCA4. STK11, TP53, TSC1, TSC2, and VHL.  The following genes were evaluated for sequence changes only: SDHA and HOXB13 c.251G>A variant only.  The report date is April 12, 2017.        FAMILY HISTORY:  We obtained a detailed, 4-generation family history.  Significant diagnoses are listed below: Family History  Problem Relation Age of Onset  . Hypertension Mother   . Heart disease Mother   . Gallbladder disease Mother   . Thyroid disease Mother   . Arthritis Mother   . Diabetes Father   . Cancer  Father        pancreatic  . Hypertension Sister   . Gallbladder disease Sister   . Diabetes Sister   . Diverticulitis Sister        of small intestine  . Cancer Son        testicular cancer  . Stroke Sister        late 67's  . Hypertension Sister   . Pulmonary embolism Sister   . Ulcerative colitis Sister   . COPD Brother        had lung lobe removed for suspected cancer, but was benign; smoker  . Colon cancer Other 59  . Lung cancer Cousin        maternal first cousin - smoker  . Breast cancer Neg Hx     The patient has two sons, one who was diagnosed with testicular cancer at 55.  She has one brother and three sisters.  One sister died from blood clots, and one sister has a daughter who had colon cancer at 42.  The patients father had pancreatic cancer at 20.  He was one of 12 siblings, no information is available about the paternal side of the family.  The patient's mother died at 35 from smoking.  She had three brothers and three sisters, none who had cancer.  One aunt has a daughter with lung cancer.  There is no other reported family history of cancer.  Mckenzie Webb is unaware of previous family history of genetic testing for hereditary cancer risks. Patient's maternal ancestors are of Caucasian descent, and paternal ancestors are of Caucasian descent. There is no reported Ashkenazi Jewish ancestry. There is no known consanguinity.  GENETIC TEST RESULTS: Genetic testing reported out on April 12, 2017 through the Common Hereditary cancer panel found no deleterious mutations.  The Hereditary Gene Panel offered by [facility] includes sequencing and/or deletion duplication testing of the following 46 genes: APC, ATM, AXIN2, BARD1, BMPR1A, BRCA1, BRCA2, BRIP1, CDH1, CDKN2A (p14ARF), CDKN2A (p16INK4a), CHEK2, CTNNA1, DICER1, EPCAM (Deletion/duplication testing only), GREM1 (promoter region deletion/duplication testing only), KIT, MEN1, MLH1, MSH2, MSH3, MSH6, MUTYH, NBN, NF1, NHTL1, PALB2,  PDGFRA, PMS2, POLD1, POLE, PTEN, RAD50, RAD51C, RAD51D, SDHB, SDHC, SDHD, SMAD4, SMARCA4. STK11, TP53, TSC1, TSC2, and VHL.  The following genes were evaluated for sequence changes only: SDHA and HOXB13 c.251G>A variant only.  The test report has been scanned into EPIC and is located under the Molecular Pathology section of the Results Review tab.   We discussed with Mckenzie Webb that since the current genetic testing is not perfect, it is possible there may be a gene mutation in one of these genes that current testing cannot detect, but that chance is small. We also discussed, that it is possible that another gene that has not yet been discovered, or that we have not yet tested, is responsible for the cancer diagnoses in the family, and it is, therefore, important to remain in touch with cancer genetics in the future so that we can continue to offer Mckenzie Webb the most up to date genetic testing.     CANCER SCREENING RECOMMENDATIONS:  This result is reassuring and indicates that Mckenzie Webb likely does not have an increased risk for a future cancer due to a mutation in one of these genes. This normal test also suggests that Mckenzie Webb cancer was most likely not due to an inherited predisposition associated with one of these genes.  Most cancers happen by chance and this negative test suggests that her cancer falls into this category.  We, therefore, recommended she continue to follow the cancer management and screening guidelines provided by her oncology and primary healthcare provider.   RECOMMENDATIONS FOR FAMILY MEMBERS: Women in this family might be at some increased risk of developing cancer, over the general population risk, simply due to the family history of cancer. We recommended women in this family have a yearly mammogram beginning at age 17, or 70 years younger than the earliest onset of cancer, an annual clinical breast exam, and perform monthly breast self-exams. Women in this family should  also have a gynecological exam as recommended by their primary provider. All family members should have a colonoscopy by age 4.  FOLLOW-UP: Lastly, we discussed with Mckenzie Webb that cancer genetics is a rapidly advancing field and it is possible that new genetic tests will be appropriate for her and/or her family members in the future. We encouraged her to remain in contact with cancer genetics on an annual basis so we can update her personal and family histories and let her know of advances in cancer genetics that may benefit this family.   Our contact number was provided. Mckenzie Webb questions were answered to her satisfaction, and she knows she is welcome to call us at anytime with additional questions or concerns.   Roma Kayser, MS, [facility] Certified Genetic Counselor [email or web address]

## 2017-04-18 NOTE — Patient Instructions (Signed)
PLEASANT BRITZ  04/18/2017   Your procedure is scheduled on: 04/24/17  Report to [facility] Main  Entrance Take [place]  elevators to 3rd floor to  [place] at   [place] AM.    Call this number if you have problems the morning of surgery [phone]    Remember: ONLY 1 PERSON MAY GO WITH YOU TO SHORT STAY TO GET  READY MORNING OF YOUR SURGERY.  Do not eat food or drink liquids :After Midnight.     Take these medicines the morning of surgery with A SIP OF WATER: Synthroid, claritin, Prilosec                                 You may not have any metal on your body including hair pins and              piercings  Do not wear jewelry, make-up, lotions, powders or perfumes, deodorant             Do not wear nail polish.  Do not shave  48 hours prior to surgery.     Do not bring valuables to the hospital. [place].  Contacts, dentures or bridgework may not be worn into surgery.      Patients discharged the day of surgery will not be allowed to drive home.  Name and phone number of your driver:                Please read over the following fact sheets you were given: _____________________________________________________________________             [facility] - Preparing for Surgery Before surgery, you can play an important role.  Because skin is not sterile, your skin needs to be as free of germs as possible.  You can reduce the number of germs on your skin by washing with CHG (chlorahexidine gluconate) soap before surgery.  CHG is an antiseptic cleaner which kills germs and bonds with the skin to continue killing germs even after washing. Please DO NOT use if you have an allergy to CHG or antibacterial soaps.  If your skin becomes reddened/irritated stop using the CHG and inform your nurse when you arrive at Short Stay. Do not shave (including legs and underarms) for at least 48 hours prior to the first  CHG shower.  You may shave your face/neck. Please follow these instructions carefully:  1.  Shower with CHG Soap the night before surgery and the  morning of Surgery.  2.  If you choose to wash your hair, wash your hair first as usual with your  normal  shampoo.  3.  After you shampoo, rinse your hair and body thoroughly to remove the  shampoo.                           4.  Use CHG as you would any other liquid soap.  You can apply chg directly  to the skin and wash                       Gently with a scrungie or clean washcloth.  5.  Apply the CHG Soap  to your body ONLY FROM THE NECK DOWN.   Do not use on face/ open                           Wound or open sores. Avoid contact with eyes, ears mouth and genitals (private parts).                       Wash face,  Genitals (private parts) with your normal soap.             6.  Wash thoroughly, paying special attention to the area where your surgery  will be performed.  7.  Thoroughly rinse your body with warm water from the neck down.  8.  DO NOT shower/wash with your normal soap after using and rinsing off  the CHG Soap.                9.  Pat yourself dry with a clean towel.            10.  Wear clean pajamas.            11.  Place clean sheets on your bed the night of your first shower and do not  sleep with pets. Day of Surgery : Do not apply any lotions/deodorants the morning of surgery.  Please wear clean clothes to the hospital/surgery center.  FAILURE TO FOLLOW THESE INSTRUCTIONS MAY RESULT IN THE CANCELLATION OF YOUR SURGERY PATIENT SIGNATURE_________________________________  NURSE SIGNATURE__________________________________  ________________________________________________________________________

## 2017-04-23 ENCOUNTER — Ambulatory Visit (HOSPITAL_BASED_OUTPATIENT_CLINIC_OR_DEPARTMENT_OTHER): Payer: Medicare Other | Admitting: Oncology

## 2017-04-23 ENCOUNTER — Encounter (HOSPITAL_COMMUNITY)
Admission: RE | Admit: 2017-04-23 | Discharge: 2017-04-23 | Disposition: A | Discharge status: Home / Self Care | Payer: Medicare Other | Source: Ambulatory Visit | Attending: General Surgery | Admitting: General Surgery

## 2017-04-23 ENCOUNTER — Encounter: Payer: Self-pay | Admitting: *Deleted

## 2017-04-23 ENCOUNTER — Encounter (HOSPITAL_COMMUNITY): Payer: Self-pay

## 2017-04-23 ENCOUNTER — Other Ambulatory Visit: Payer: Medicare Other

## 2017-04-23 VITALS — BP 170/77 | HR 74 | Temp 98.3°F | Resp 18 | Ht 64.0 in | Wt 171.1 lb

## 2017-04-23 DIAGNOSIS — Z170 Estrogen receptor positive status [ER+]: Secondary | ICD-10-CM | POA: Diagnosis not present

## 2017-04-23 DIAGNOSIS — L658 Other specified nonscarring hair loss: Secondary | ICD-10-CM

## 2017-04-23 DIAGNOSIS — C50111 Malignant neoplasm of central portion of right female breast: Secondary | ICD-10-CM | POA: Diagnosis not present

## 2017-04-23 DIAGNOSIS — F419 Anxiety disorder, unspecified: Secondary | ICD-10-CM | POA: Diagnosis not present

## 2017-04-23 DIAGNOSIS — Z91041 Radiographic dye allergy status: Secondary | ICD-10-CM | POA: Diagnosis not present

## 2017-04-23 DIAGNOSIS — Z880 Allergy status to penicillin: Secondary | ICD-10-CM | POA: Diagnosis not present

## 2017-04-23 DIAGNOSIS — C50211 Malignant neoplasm of upper-inner quadrant of right female breast: Secondary | ICD-10-CM

## 2017-04-23 DIAGNOSIS — Z882 Allergy status to sulfonamides status: Secondary | ICD-10-CM | POA: Diagnosis not present

## 2017-04-23 DIAGNOSIS — M1990 Unspecified osteoarthritis, unspecified site: Secondary | ICD-10-CM | POA: Diagnosis not present

## 2017-04-23 DIAGNOSIS — K219 Gastro-esophageal reflux disease without esophagitis: Secondary | ICD-10-CM | POA: Diagnosis not present

## 2017-04-23 DIAGNOSIS — Z881 Allergy status to other antibiotic agents status: Secondary | ICD-10-CM | POA: Diagnosis not present

## 2017-04-23 DIAGNOSIS — I10 Essential (primary) hypertension: Secondary | ICD-10-CM | POA: Diagnosis not present

## 2017-04-23 DIAGNOSIS — Z885 Allergy status to narcotic agent status: Secondary | ICD-10-CM | POA: Diagnosis not present

## 2017-04-23 DIAGNOSIS — K5190 Ulcerative colitis, unspecified, without complications: Secondary | ICD-10-CM | POA: Diagnosis not present

## 2017-04-23 DIAGNOSIS — Z79899 Other long term (current) drug therapy: Secondary | ICD-10-CM | POA: Diagnosis not present

## 2017-04-23 LAB — CBC
HCT: 40.9 % (ref 36.0–46.0)
HEMOGLOBIN: 14 g/dL (ref 12.0–15.0)
MCH: 31.4 pg (ref 26.0–34.0)
MCHC: 34.2 g/dL (ref 30.0–36.0)
MCV: 91.7 fL (ref 78.0–100.0)
PLATELETS: 204 10*3/uL (ref 150–400)
RBC: 4.46 MIL/uL (ref 3.87–5.11)
RDW: 12.7 % (ref 11.5–15.5)
WBC: 4.8 10*3/uL (ref 4.0–10.5)

## 2017-04-23 LAB — BASIC METABOLIC PANEL
ANION GAP: 6 (ref 5–15)
BUN: 17 mg/dL (ref 6–20)
CO2: 28 mmol/L (ref 22–32)
Calcium: 9.4 mg/dL (ref 8.9–10.3)
Chloride: 103 mmol/L (ref 101–111)
Creatinine, Ser: 0.81 mg/dL (ref 0.44–1.00)
GFR calc Af Amer: >60 mL/min (ref >60)
GFR calc non Af Amer: >60 mL/min (ref >60)
GLUCOSE: 110 mg/dL — AB (ref 65–99)
POTASSIUM: 3.9 mmol/L (ref 3.5–5.1)
SODIUM: 137 mmol/L (ref 135–145)

## 2017-04-23 MED ORDER — LIDOCAINE-PRILOCAINE 2.5-2.5 % EX CREA: TOPICAL_CREAM | CUTANEOUS | 3 refills | Status: DC

## 2017-04-23 MED ORDER — LORAZEPAM 0.5 MG PO TABS: 0.5000 mg | ORAL_TABLET | Freq: Every evening | ORAL | 0 refills | Status: DC | PRN

## 2017-04-23 MED ORDER — DEXAMETHASONE 4 MG PO TABS: 8.0000 mg | ORAL_TABLET | Freq: Two times a day (BID) | ORAL | 1 refills | Status: DC

## 2017-04-23 MED ORDER — PROCHLORPERAZINE MALEATE 10 MG PO TABS: 10.0000 mg | ORAL_TABLET | Freq: Four times a day (QID) | ORAL | 1 refills | Status: DC | PRN

## 2017-04-23 NOTE — Progress Notes (Signed)
EKG-03/25/17-epic

## 2017-04-23 NOTE — Progress Notes (Signed)
[place]  [phone] [phone]     ID: [name] DOB: [birth year]  MR#: [id number]  WUX#:[id number]  Patient Care Team: Rita Ohara, MD as PCP - General (Family Medicine) Jovita Kussmaul, MD as Consulting Physician (General Surgery) Magrinat, Virgie Dad, MD as Consulting Physician (Oncology) Eppie Gibson, MD as Attending Physician (Radiation Oncology) Juanita Craver, MD as Consulting Physician (Gastroenterology) Vania Rea, MD as Consulting Physician (Obstetrics and Gynecology) Elsie Saas, MD as Consulting Physician (Orthopedic Surgery) Martinique, Amy, MD as Consulting Physician (Dermatology) Chauncey Cruel, MD OTHER MD:  CHIEF COMPLAINT: Estrogen receptor positive breast cancer  CURRENT TREATMENT: Adjuvant chemotherapy   BREAST CANCER HISTORY: From the original intake note:  "[name]" had bilateral screening mammography with tomography at the [facility] 02/21/2017. This showed a possible mass in the right breast. Right diagnostic mammography with tomography and ultrasonography 02/27/2017 found a breast density to be category B. In the subareolar right breast there was a 0.5 cm spiculated mass, which was not palpable. There was mild right nipple inversion, which per report was chronic. Ultrasonography confirmed a 0.5 cm retroareolar breast mass at the 1:00 radiant. The right axilla was sonographically benign.  On 03/03/2017 the patient underwent biopsy of the right breast mass in question. This showed (SAA [id number]) and invasive ductal carcinoma, grade 1, estrogen receptor 100% positive, progesterone receptor negative, with an MIB-1 of 3%, and no HER-2 implication, the signals ratio being 1.25 and the number per cell 2.00.  The patient's subsequent history is as detailed below.  INTERVAL HISTORY: [name] returns today for follow up and treatment of her estrogen receptor positive breast cancer. As her mammaprint score was read High Risk  she will receive chemotherapy. She came to chemo school earlier today  Her chemotherapy will consist of cyclophosphamide and docetaxel every 3 weeks x 4. Sheis here today to discuss the overall plan and how to take her supportive medications  REVIEW OF SYSTEMS: She is feeling overwhelmed with information today and is very worried about port placement tomorrow. Aside from anxiety issues however a detailed review of systems today was stable  PAST MEDICAL HISTORY: Past Medical History:  Diagnosis Date  . Allergy   . DJD (degenerative joint disease), cervical 04 2002  . Edema    resolved  . Family history of colon cancer   . Family history of pancreatic cancer   . GERD (gastroesophageal reflux disease)   . Hyperlipidemia   . Hypothyroid   . Interstitial cystitis   . Leukopenia   . PONV (postoperative nausea and vomiting)   . Psoriasis     PAST SURGICAL HISTORY: Past Surgical History:  Procedure Laterality Date  . BREAST LUMPECTOMY WITH RADIOACTIVE SEED AND SENTINEL LYMPH NODE BIOPSY Right 03/26/2017   Procedure: BREAST LUMPECTOMY WITH RADIOACTIVE SEED AND SENTINEL LYMPH NODE BIOPSY;  Surgeon: Jovita Kussmaul, MD;  Location: [facility];  Service: General;  Laterality: Right;  . CHOLECYSTECTOMY    . COLONOSCOPY  3/09  . KNEE ARTHROSCOPY Right 1990  . KNEE ARTHROSCOPY Left 12/12/2010    (Dr. Noemi Chapel)  . TUBAL LIGATION  1983  . VARICOSE VEIN SURGERY  09/2009 R, 06/2010 L    FAMILY HISTORY Family History  Problem Relation Age of Onset  . Hypertension Mother   . Heart disease Mother   . Gallbladder disease Mother   . Thyroid disease Mother   . Arthritis Mother   . Diabetes Father   . Cancer Father  pancreatic  . Hypertension Sister   . Gallbladder disease Sister   . Diabetes Sister   . Diverticulitis Sister        of small intestine  . Cancer Son        testicular cancer  . Stroke Sister        late 71's  . Hypertension Sister   . Pulmonary embolism  Sister   . Ulcerative colitis Sister   . COPD Brother        had lung lobe removed for suspected cancer, but was benign; smoker  . Colon cancer Other 69  . Lung cancer Cousin        maternal first cousin - smoker  . Breast cancer Neg Hx   The patient's father died from pancreatic cancer at the age of 72. The patient's mother died from a myocardial infarction at age 74. The patient had one brother, 3 sisters. The patient's older son was diagnosed with testicular cancer at age 19. A maternal niece was diagnosed with colon cancer at age 24 and a maternal cousin with lung cancer at an unknown age.  GYNECOLOGIC HISTORY:  No LMP recorded. Patient is postmenopausal.  Menarche age 5, first live birth age 71, the patient is [place] P2. she went through the change of life approximately the year 2000. She did not take hormone replacement. She did take oral contraceptives for more than 20 years remotely, with no complications.   SOCIAL HISTORY:  She is a retired Web designer. Her husband Arnette Norris is an Clinical biochemist, still working part-time. Son Corene Cornea (from the patient's first marriage) lives in [place] and works in Therapist, art. Son Catalina Antigua (from patient second marriage) lives in [place] we are is Comptroller. The patient has one grandchild. She is not a Ambulance person.    ADVANCED DIRECTIVES: Not in place   HEALTH MAINTENANCE: Social History  Substance Use Topics  . Smoking status: Never Smoker  . Smokeless tobacco: Never Used  . Alcohol use No     Comment: social, 1-2 times per year     Colonoscopy:2009/[facility] and  PAP:  [facility], T score -0.1 (normal)   Allergies  Allergen Reactions  . Iodine Swelling  . Sulfa Antibiotics Swelling    Lips swell, itching  . Penicillins Rash    Has patient had a PCN reaction causing immediate rash, facial/tongue/throat swelling, SOB or lightheadedness with hypotension: No Has patient had a PCN reaction  causing severe rash involving mucus membranes or skin necrosis: No Has patient had a PCN reaction that required hospitalization: No Has patient had a PCN reaction occurring within the last 10 years: No If all of the above answers are "NO", then may proceed with Cephalosporin use.   Newell Coral [Bacitracin-Polymyxin B] Swelling    Used for eye infection--got redder and worse  . Ciprofloxacin     After she takes for a while it gives her a "funny feeling."  . Codeine Nausea Only    Current Outpatient Prescriptions  Medication Sig Dispense Refill  . acetaminophen (TYLENOL) 500 MG tablet Take 1,000 mg by mouth 3 (three) times daily as needed for moderate pain.    Marland Kitchen anastrozole (ARIMIDEX) 1 MG tablet Take 1 tablet (1 mg total) by mouth daily. 90 tablet 4  . Calcium Carbonate-Vitamin D (CALCIUM 600+D) 600-400 MG-UNIT tablet Take 1 tablet by mouth daily.    . Cholecalciferol (VITAMIN D) 1000 UNITS capsule Take 1,000 Units by mouth daily.      Marland Kitchen  Coenzyme Q10 (COQ10) 100 MG CAPS Take 100 mg by mouth daily.    . diclofenac (VOLTAREN) 75 MG EC tablet Take 75 mg by mouth 2 (two) times daily.    Marland Kitchen levothyroxine (SYNTHROID, LEVOTHROID) 88 MCG tablet Take 1 tablet (88 mcg total) by mouth daily. (Patient taking differently: Take 88 mcg by mouth daily before breakfast. 30 minutes before eating anything) 90 tablet 3  . loratadine (CLARITIN) 10 MG tablet Take 10 mg by mouth daily as needed for allergies.     Marland Kitchen losartan-hydrochlorothiazide (HYZAAR) 50-12.5 MG tablet TAKE ONE (1) TABLET BY MOUTH EVERY DAY 90 tablet 3  . Multiple Vitamins-Minerals (ALIVE WOMENS 50+ PO) Take 1 tablet by mouth daily.     . Omega-3 Fatty Acids (FISH OIL) 1200 MG CAPS Take 2,400 mg by mouth at bedtime.    Marland Kitchen omeprazole (PRILOSEC) 40 MG capsule TAKE ONE (1) CAPSULE EACH DAY 90 capsule 2  . Red Yeast Rice Extract (RED YEAST RICE PO) Take 1,200 mg by mouth 2 (two) times daily.     Marland Kitchen triamcinolone cream (KENALOG) 0.1 % Apply 1  application topically 2 (two) times daily. (Patient taking differently: Apply 1 application topically 2 (two) times daily as needed (psoriasis). ) 45 g 2  . vitamin B-12 (CYANOCOBALAMIN) 1000 MCG tablet Take 1,000 mcg by mouth daily.     No current facility-administered medications for this visit.     OBJECTIVE: Middle-aged white woman in no acute distress  Vitals:   04/23/17 1213  BP: (!) 170/77  Pulse: 74  Resp: 18  Temp: 98.3 F (36.8 C)  SpO2: 100%     Body mass index is 29.37 kg/m.   Wt Readings from Last 3 Encounters:  04/23/17 171 lb 1.6 oz (77.6 kg)  04/23/17 171 lb (77.6 kg)  04/10/17 170 lb 12.8 oz (77.5 kg)      ECOG FS:0 - Asymptomatic  Sclerae unicteric, EOMs intact Oropharynx clear and moist No cervical or supraclavicular adenopathy Lungs no rales or rhonchi Heart regular rate and rhythm Abd soft, nontender, positive bowel sounds MSK no focal spinal tenderness, no upper extremity lymphedema Neuro: nonfocal, well oriented, appropriate affect Breasts: Deferred   LAB RESULTS:  CMP     Component Value Date/Time   NA 137 04/23/2017 0829   NA 142 03/12/2017 1225   K 3.9 04/23/2017 0829   K 4.0 03/12/2017 1225   CL 103 04/23/2017 0829   CO2 28 04/23/2017 0829   CO2 27 03/12/2017 1225   GLUCOSE 110 (H) 04/23/2017 0829   GLUCOSE 100 03/12/2017 1225   BUN 17 04/23/2017 0829   BUN 14.1 03/12/2017 1225   CREATININE 0.81 04/23/2017 0829   CREATININE 0.9 03/12/2017 1225   CALCIUM 9.4 04/23/2017 0829   CALCIUM 9.5 03/12/2017 1225   PROT 7.5 03/12/2017 1225   ALBUMIN 3.9 03/12/2017 1225   AST 23 03/12/2017 1225   ALT 31 03/12/2017 1225   ALKPHOS 58 03/12/2017 1225   BILITOT 0.49 03/12/2017 1225   GFRNONAA >60 04/23/2017 0829   GFRAA >60 04/23/2017 0829    No results found for: TOTALPROTELP, ALBUMINELP, A1GS, A2GS, BETS, BETA2SER, GAMS, MSPIKE, SPEI  No results found for: KPAFRELGTCHN, LAMBDASER, KAPLAMBRATIO  Lab Results  Component Value Date    WBC 4.8 04/23/2017   NEUTROABS 2.7 03/12/2017   HGB 14.0 04/23/2017   HCT 40.9 04/23/2017   MCV 91.7 04/23/2017   PLT 204 04/23/2017      Chemistry      Component Value  Date/Time   NA 137 04/23/2017 0829   NA 142 03/12/2017 1225   K 3.9 04/23/2017 0829   K 4.0 03/12/2017 1225   CL 103 04/23/2017 0829   CO2 28 04/23/2017 0829   CO2 27 03/12/2017 1225   BUN 17 04/23/2017 0829   BUN 14.1 03/12/2017 1225   CREATININE 0.81 04/23/2017 0829   CREATININE 0.9 03/12/2017 1225      Component Value Date/Time   CALCIUM 9.4 04/23/2017 0829   CALCIUM 9.5 03/12/2017 1225   ALKPHOS 58 03/12/2017 1225   AST 23 03/12/2017 1225   ALT 31 03/12/2017 1225   BILITOT 0.49 03/12/2017 1225       No results found for: LABCA2  No components found for: IOEVOJ500  No results for input(s): INR in the last 168 hours.  Urinalysis    Component Value Date/Time   BILIRUBINUR neg 08/17/2014 1123   PROTEINUR neg 08/17/2014 1123   UROBILINOGEN negative 08/17/2014 1123   NITRITE neg 08/17/2014 1123   LEUKOCYTESUR small (1+) 08/17/2014 1123     STUDIES: Mm Breast Surgical Specimen  Result Date: 03/26/2017 CLINICAL DATA:  Status post lumpectomy today after earlier radioactive seed localization. EXAM: SPECIMEN RADIOGRAPH OF THE RIGHT BREAST COMPARISON:  Previous exam(s). FINDINGS: Status post excision of the right breast. The radioactive seed and biopsy marker clip are present, completely intact, and were marked for pathology. The positions of the radioactive seed and biopsy marker clip within the specimen were discussed with the OR staff during the procedure. IMPRESSION: Specimen radiograph of the right breast. Electronically Signed   By: Franki Cabot M.D.   On: 03/26/2017 12:36   Mm Rt Radioactive Seed Loc Mammo Guide  Result Date: 03/25/2017 CLINICAL DATA:  Patient with right breast cancer scheduled for breast conservation surgery requiring preoperative radioactive seed localization. EXAM:  MAMMOGRAPHIC GUIDED RADIOACTIVE SEED LOCALIZATION OF THE RIGHT BREAST COMPARISON:  Previous exam(s). FINDINGS: Patient presents for radioactive seed localization prior to breast conservation surgery. I met with the patient and we discussed the procedure of seed localization including benefits and alternatives. We discussed the high likelihood of a successful procedure. We discussed the risks of the procedure including infection, bleeding, tissue injury and further surgery. We discussed the low dose of radioactivity involved in the procedure. Informed, written consent was given. The usual time-out protocol was performed immediately prior to the procedure. Using mammographic guidance, sterile technique, 1% lidocaine and an I-125 radioactive seed, ribbon shaped clip was localized using a medial approach. The follow-up mammogram images confirm the seed in the expected location and were marked for Dr. Marlou Starks. Follow-up survey of the patient confirms presence of the radioactive seed. Order number of I-125 seed:  [id number]. Total activity:  7.169 millicuries  Reference Date: 03/06/2017 The patient tolerated the procedure well and was released from the [facility]. She was given instructions regarding seed removal. IMPRESSION: Radioactive seed localization right breast. No apparent complications. Electronically Signed   By: Franki Cabot M.D.   On: 03/25/2017 15:30    ELIGIBLE FOR AVAILABLE RESEARCH PROTOCOL: no  ASSESSMENT: 71 y.o. [place] woman status post right breast upper inner quadrant biopsy 03/04/2015 for a clinical T1a N0, stage IA invasive ductal carcinoma, grade 1, estrogen receptor positive, progesterone receptor negative, with an MIB-1 of 3% and no HER-2 amplification  (1) anastrozole started neoadjuvantly 03/12/2017 in anticipation of possible surgical delays   (2) genetics testingNegative genetic testing on the common hereditary cancer panel.  The Hereditary Gene Panel offered by [facility] includes  sequencing and/or deletion duplication testing of the following 46 genes: APC, ATM, AXIN2, BARD1, BMPR1A, BRCA1, BRCA2, BRIP1, CDH1, CDKN2A (p14ARF), CDKN2A (p16INK4a), CHEK2, CTNNA1, DICER1, EPCAM (Deletion/duplication testing only), GREM1 (promoter region deletion/duplication testing only), KIT, MEN1, MLH1, MSH2, MSH3, MSH6, MUTYH, NBN, NF1, NHTL1, PALB2, PDGFRA, PMS2, POLD1, POLE, PTEN, RAD50, RAD51C, RAD51D, SDHB, SDHC, SDHD, SMAD4, SMARCA4. STK11, TP53, TSC1, TSC2, and VHL.  The following genes were evaluated for sequence changes only: SDHA and HOXB13 c.251G>A variant only.  The report date is April 12, 2017.  (3) right lumpectomy and sentinel lymph node biopsy 03/26/2017 showed a pT1c pN1, stage IB invasive ductal carcinoma, grade 1, with negative margins  (4) Mammaprint returned high risk, indicating need for adjuvant chemotherapy  (5) chemotherapy will consist of Cytoxan and docetaxel given every 3 weeks 4 beginning on 04/28/2017  (6) adjuvant radiation to follow   (7) continue anti-estrogens to a total of 5 years.    PLAN: [name] was somewhat overwhelmed today after meeting with the teaching nurse regarding chemotherapy side effects and their management. We reviewed that in detail today and I spent approximately 40 minutes with her giving her a copy of the "roadmap" on how to take her supportive medicines, which dates to take it about when to stop,, and in general on how to read and use this information sheet.  We discussed anticipated problems including her hair loss. At this point she does not have a plan but will be meeting with second nature next week to start considering wigs versus easier methods such as been done as her habits. We discussed the fact that it is frequently very shocking when the patient does lose their hair. I try to prepare her for this but I anticipate it will be a major issue for her.  In short, while [name] is committed to receiving chemotherapy she is dreading  at and very anxious. She will need extra support. Accordingly I am making appointments for her node only on every treatment day but also a week later to discuss and troubleshoot side effects.  Once she completes chemotherapy she will be ready to proceed to radiation treatments. This will be followed by anti-estrogens  The good news is that the chance of five-year disease-free survival with her current treatment according to the Mammaprint is excellent. That is her ultimate goal.   Chauncey Cruel, MD   04/23/2017 12:27 PM Medical Oncology and Hematology [facility] [address] [place], [place] [address] Tel. [phone]    Fax. [phone]

## 2017-04-24 ENCOUNTER — Ambulatory Visit (HOSPITAL_COMMUNITY)
Admission: RE | Admit: 2017-04-24 | Discharge: 2017-04-24 | Disposition: A | Discharge status: Home / Self Care | Payer: Medicare Other | Source: Ambulatory Visit | Attending: General Surgery | Admitting: General Surgery

## 2017-04-24 ENCOUNTER — Ambulatory Visit (HOSPITAL_COMMUNITY): Payer: Medicare Other | Admitting: Anesthesiology

## 2017-04-24 ENCOUNTER — Encounter (HOSPITAL_COMMUNITY)
Admission: RE | Disposition: A | Discharge status: Home / Self Care | Payer: Self-pay | Source: Ambulatory Visit | Attending: General Surgery

## 2017-04-24 ENCOUNTER — Ambulatory Visit (HOSPITAL_COMMUNITY): Payer: Medicare Other

## 2017-04-24 ENCOUNTER — Encounter (HOSPITAL_COMMUNITY): Payer: Self-pay | Admitting: *Deleted

## 2017-04-24 DIAGNOSIS — Z170 Estrogen receptor positive status [ER+]: Secondary | ICD-10-CM | POA: Diagnosis not present

## 2017-04-24 DIAGNOSIS — K219 Gastro-esophageal reflux disease without esophagitis: Secondary | ICD-10-CM | POA: Insufficient documentation

## 2017-04-24 DIAGNOSIS — M1990 Unspecified osteoarthritis, unspecified site: Secondary | ICD-10-CM | POA: Insufficient documentation

## 2017-04-24 DIAGNOSIS — I10 Essential (primary) hypertension: Secondary | ICD-10-CM | POA: Diagnosis not present

## 2017-04-24 DIAGNOSIS — E039 Hypothyroidism, unspecified: Secondary | ICD-10-CM | POA: Diagnosis not present

## 2017-04-24 DIAGNOSIS — Z885 Allergy status to narcotic agent status: Secondary | ICD-10-CM | POA: Diagnosis not present

## 2017-04-24 DIAGNOSIS — F419 Anxiety disorder, unspecified: Secondary | ICD-10-CM | POA: Diagnosis not present

## 2017-04-24 DIAGNOSIS — Z881 Allergy status to other antibiotic agents status: Secondary | ICD-10-CM | POA: Insufficient documentation

## 2017-04-24 DIAGNOSIS — Z880 Allergy status to penicillin: Secondary | ICD-10-CM | POA: Insufficient documentation

## 2017-04-24 DIAGNOSIS — C50111 Malignant neoplasm of central portion of right female breast: Secondary | ICD-10-CM | POA: Insufficient documentation

## 2017-04-24 DIAGNOSIS — Z79899 Other long term (current) drug therapy: Secondary | ICD-10-CM | POA: Insufficient documentation

## 2017-04-24 DIAGNOSIS — Z882 Allergy status to sulfonamides status: Secondary | ICD-10-CM | POA: Diagnosis not present

## 2017-04-24 DIAGNOSIS — K5190 Ulcerative colitis, unspecified, without complications: Secondary | ICD-10-CM | POA: Diagnosis not present

## 2017-04-24 DIAGNOSIS — Z452 Encounter for adjustment and management of vascular access device: Secondary | ICD-10-CM | POA: Diagnosis not present

## 2017-04-24 DIAGNOSIS — C50911 Malignant neoplasm of unspecified site of right female breast: Secondary | ICD-10-CM | POA: Diagnosis not present

## 2017-04-24 DIAGNOSIS — Z95828 Presence of other vascular implants and grafts: Secondary | ICD-10-CM

## 2017-04-24 DIAGNOSIS — C50919 Malignant neoplasm of unspecified site of unspecified female breast: Secondary | ICD-10-CM | POA: Diagnosis not present

## 2017-04-24 DIAGNOSIS — E7800 Pure hypercholesterolemia, unspecified: Secondary | ICD-10-CM | POA: Diagnosis not present

## 2017-04-24 DIAGNOSIS — Z91041 Radiographic dye allergy status: Secondary | ICD-10-CM | POA: Insufficient documentation

## 2017-04-24 HISTORY — PX: PORTACATH PLACEMENT: SHX2246

## 2017-04-24 SURGERY — INSERTION, TUNNELED CENTRAL VENOUS DEVICE, WITH PORT
Anesthesia: General | Site: Chest | Laterality: Left

## 2017-04-24 MED ORDER — LACTATED RINGERS IV SOLN: INTRAVENOUS | Status: DC

## 2017-04-24 MED ORDER — CHLORHEXIDINE GLUCONATE CLOTH 2 % EX PADS: 6.0000 | MEDICATED_PAD | Freq: Once | CUTANEOUS | Status: DC

## 2017-04-24 MED ORDER — LACTATED RINGERS IV SOLN
INTRAVENOUS | Status: DC | PRN
  Administered 2017-04-24: 07:00:00 via INTRAVENOUS

## 2017-04-24 MED ORDER — VANCOMYCIN HCL IN DEXTROSE 1-5 GM/200ML-% IV SOLN
1000.0000 mg | INTRAVENOUS | Status: AC
  Administered 2017-04-24: 1000 mg via INTRAVENOUS

## 2017-04-24 MED ORDER — LIDOCAINE 2% (20 MG/ML) 5 ML SYRINGE
INTRAMUSCULAR | Status: DC | PRN
  Administered 2017-04-24: 80 mg via INTRAVENOUS

## 2017-04-24 MED ORDER — HEPARIN SOD (PORK) LOCK FLUSH 100 UNIT/ML IV SOLN
INTRAVENOUS | Status: DC | PRN
  Administered 2017-04-24: 500 [IU] via INTRAVENOUS

## 2017-04-24 MED ORDER — PROPOFOL 10 MG/ML IV BOLUS
INTRAVENOUS | Status: AC
  Filled 2017-04-24: qty 20

## 2017-04-24 MED ORDER — ONDANSETRON HCL 4 MG/2ML IJ SOLN
INTRAMUSCULAR | Status: AC
  Filled 2017-04-24: qty 2

## 2017-04-24 MED ORDER — PROPOFOL 500 MG/50ML IV EMUL
INTRAVENOUS | Status: DC | PRN
  Administered 2017-04-24: 25 ug/kg/min via INTRAVENOUS

## 2017-04-24 MED ORDER — PROPOFOL 10 MG/ML IV BOLUS
INTRAVENOUS | Status: DC | PRN
  Administered 2017-04-24: 180 mg via INTRAVENOUS

## 2017-04-24 MED ORDER — MIDAZOLAM HCL 2 MG/2ML IJ SOLN
INTRAMUSCULAR | Status: AC
  Filled 2017-04-24: qty 2

## 2017-04-24 MED ORDER — ACETAMINOPHEN 500 MG PO TABS
1000.0000 mg | ORAL_TABLET | ORAL | Status: AC
  Administered 2017-04-24: 1000 mg via ORAL
  Filled 2017-04-24: qty 2

## 2017-04-24 MED ORDER — ONDANSETRON HCL 4 MG/2ML IJ SOLN
INTRAMUSCULAR | Status: DC | PRN
  Administered 2017-04-24: 4 mg via INTRAVENOUS

## 2017-04-24 MED ORDER — DEXAMETHASONE SODIUM PHOSPHATE 10 MG/ML IJ SOLN
INTRAMUSCULAR | Status: DC | PRN
  Administered 2017-04-24: 10 mg via INTRAVENOUS

## 2017-04-24 MED ORDER — MIDAZOLAM HCL 5 MG/5ML IJ SOLN
INTRAMUSCULAR | Status: DC | PRN
  Administered 2017-04-24: 2 mg via INTRAVENOUS

## 2017-04-24 MED ORDER — FENTANYL CITRATE (PF) 100 MCG/2ML IJ SOLN
INTRAMUSCULAR | Status: AC
  Filled 2017-04-24: qty 2

## 2017-04-24 MED ORDER — DEXAMETHASONE SODIUM PHOSPHATE 10 MG/ML IJ SOLN
INTRAMUSCULAR | Status: AC
  Filled 2017-04-24: qty 1

## 2017-04-24 MED ORDER — TRAMADOL HCL 50 MG PO TABS: 50.0000 mg | ORAL_TABLET | Freq: Four times a day (QID) | ORAL | 0 refills | Status: DC | PRN

## 2017-04-24 MED ORDER — ONDANSETRON HCL 4 MG/2ML IJ SOLN: 4.0000 mg | Freq: Once | INTRAMUSCULAR | Status: DC | PRN

## 2017-04-24 MED ORDER — BUPIVACAINE HCL (PF) 0.25 % IJ SOLN
INTRAMUSCULAR | Status: AC
  Filled 2017-04-24: qty 30

## 2017-04-24 MED ORDER — SODIUM CHLORIDE 0.9 % IR SOLN
Status: DC | PRN
  Administered 2017-04-24: 1000 mL

## 2017-04-24 MED ORDER — BUPIVACAINE HCL (PF) 0.25 % IJ SOLN
INTRAMUSCULAR | Status: DC | PRN
  Administered 2017-04-24: 6 mL

## 2017-04-24 MED ORDER — FENTANYL CITRATE (PF) 100 MCG/2ML IJ SOLN
INTRAMUSCULAR | Status: DC | PRN
  Administered 2017-04-24: 50 ug via INTRAVENOUS

## 2017-04-24 MED ORDER — HYDROCODONE-ACETAMINOPHEN 5-325 MG PO TABS: 1.0000 | ORAL_TABLET | ORAL | 0 refills | Status: DC | PRN

## 2017-04-24 MED ORDER — EPHEDRINE SULFATE-NACL 50-0.9 MG/10ML-% IV SOSY
PREFILLED_SYRINGE | INTRAVENOUS | Status: DC | PRN
  Administered 2017-04-24: 10 mg via INTRAVENOUS
  Administered 2017-04-24 (×2): 5 mg via INTRAVENOUS

## 2017-04-24 MED ORDER — LIDOCAINE 2% (20 MG/ML) 5 ML SYRINGE
INTRAMUSCULAR | Status: AC
  Filled 2017-04-24: qty 5

## 2017-04-24 MED ORDER — HEPARIN SOD (PORK) LOCK FLUSH 100 UNIT/ML IV SOLN
INTRAVENOUS | Status: AC
  Filled 2017-04-24: qty 5

## 2017-04-24 MED ORDER — GABAPENTIN 300 MG PO CAPS
300.0000 mg | ORAL_CAPSULE | ORAL | Status: AC
  Administered 2017-04-24: 300 mg via ORAL
  Filled 2017-04-24: qty 1

## 2017-04-24 MED ORDER — SODIUM CHLORIDE 0.9 % IV SOLN
Freq: Once | INTRAVENOUS | Status: AC
  Administered 2017-04-24: 08:00:00
  Filled 2017-04-24: qty 1.2

## 2017-04-24 MED ORDER — FENTANYL CITRATE (PF) 100 MCG/2ML IJ SOLN: 25.0000 ug | INTRAMUSCULAR | Status: DC | PRN

## 2017-04-24 SURGICAL SUPPLY — 41 items
ADH SKN CLS APL DERMABOND .7 (GAUZE/BANDAGES/DRESSINGS) ×1
APL SKNCLS STERI-STRIP NONHPOA (GAUZE/BANDAGES/DRESSINGS)
BAG DECANTER FOR FLEXI CONT (MISCELLANEOUS) ×3 IMPLANT
BENZOIN TINCTURE PRP APPL 2/3 (GAUZE/BANDAGES/DRESSINGS) IMPLANT
BLADE HEX COATED 2.75 (ELECTRODE) ×1 IMPLANT
BLADE SURG 15 STRL LF DISP TIS (BLADE) ×1 IMPLANT
BLADE SURG 15 STRL SS (BLADE) ×3
CLOSURE WOUND 1/2 X4 (GAUZE/BANDAGES/DRESSINGS)
DECANTER SPIKE VIAL GLASS SM (MISCELLANEOUS) ×1 IMPLANT
DERMABOND ADVANCED (GAUZE/BANDAGES/DRESSINGS) ×2
DERMABOND ADVANCED .7 DNX12 (GAUZE/BANDAGES/DRESSINGS) IMPLANT
DRAPE C-ARM 42X120 X-RAY (DRAPES) ×3 IMPLANT
DRAPE LAPAROSCOPIC ABDOMINAL (DRAPES) ×3 IMPLANT
DRSG TEGADERM 2-3/8X2-3/4 SM (GAUZE/BANDAGES/DRESSINGS) IMPLANT
DRSG TEGADERM 4X4.75 (GAUZE/BANDAGES/DRESSINGS) IMPLANT
ELECT PENCIL ROCKER SW 15FT (MISCELLANEOUS) ×3 IMPLANT
ELECT REM PT RETURN 15FT ADLT (MISCELLANEOUS) ×3 IMPLANT
GAUZE SPONGE 4X4 12PLY STRL (GAUZE/BANDAGES/DRESSINGS) IMPLANT
GAUZE SPONGE 4X4 16PLY XRAY LF (GAUZE/BANDAGES/DRESSINGS) ×3 IMPLANT
GLOVE BIO SURGEON STRL SZ7.5 (GLOVE) ×6 IMPLANT
GLOVE BIOGEL PI IND STRL 7.0 (GLOVE) ×1 IMPLANT
GLOVE BIOGEL PI INDICATOR 7.0 (GLOVE) ×2
GOWN STRL REUS W/ TWL XL LVL3 (GOWN DISPOSABLE) ×1 IMPLANT
GOWN STRL REUS W/TWL LRG LVL3 (GOWN DISPOSABLE) ×5 IMPLANT
GOWN STRL REUS W/TWL XL LVL3 (GOWN DISPOSABLE) ×1 IMPLANT
KIT BASIN OR (CUSTOM PROCEDURE TRAY) ×3 IMPLANT
KIT PORT POWER 8FR ISP CVUE (Miscellaneous) ×2 IMPLANT
NDL HYPO 25X1 1.5 SAFETY (NEEDLE) ×1 IMPLANT
NEEDLE HYPO 25X1 1.5 SAFETY (NEEDLE) ×3 IMPLANT
NS IRRIG 1000ML POUR BTL (IV SOLUTION) ×3 IMPLANT
PACK BASIC VI WITH GOWN DISP (CUSTOM PROCEDURE TRAY) ×3 IMPLANT
STRIP CLOSURE SKIN 1/2X4 (GAUZE/BANDAGES/DRESSINGS) IMPLANT
SUT MNCRL AB 4-0 PS2 18 (SUTURE) ×3 IMPLANT
SUT PROLENE 2 0 SH DA (SUTURE) ×3 IMPLANT
SUT SILK 2 0 (SUTURE)
SUT SILK 2-0 30XBRD TIE 12 (SUTURE) IMPLANT
SUT VIC AB 3-0 SH 27 (SUTURE) ×3
SUT VIC AB 3-0 SH 27XBRD (SUTURE) ×1 IMPLANT
SYR 10ML LL (SYRINGE) ×3 IMPLANT
SYR CONTROL 10ML LL (SYRINGE) ×3 IMPLANT
TOWEL OR 17X26 10 PK STRL BLUE (TOWEL DISPOSABLE) ×3 IMPLANT

## 2017-04-24 NOTE — Transfer of Care (Signed)
Immediate Anesthesia Transfer of Care Note  Patient: SHRITA THIEN  Procedure(s) Performed: Procedure(s): INSERTION PORT-A-CATH (Left)  Patient Location: PACU  Anesthesia Type:General  Level of Consciousness: awake, alert , oriented and patient cooperative  Airway & Oxygen Therapy: Patient Spontanous Breathing and Patient connected to face mask oxygen  Post-op Assessment: Report given to RN, Post -op Vital signs reviewed and stable and Patient moving all extremities  Post vital signs: Reviewed and stable  Last Vitals:  Vitals:   04/24/17 0525 04/24/17 0645  BP: (!) 182/89 (!) 154/78  Pulse: 75   Resp: 18   Temp: 36.6 C   SpO2: 100%     Last Pain:  Vitals:   04/24/17 0525  TempSrc: Oral      Patients Stated Pain Goal: 4 (29/29/09 0301)  Complications: No apparent anesthesia complications

## 2017-04-24 NOTE — Interval H&P Note (Signed)
History and Physical Interval Note:  04/24/2017 7:13 AM  Mckenzie Webb  has presented today for surgery, with the diagnosis of right breast cancer  The various methods of treatment have been discussed with the patient and family. After consideration of risks, benefits and other options for treatment, the patient has consented to  Procedure(s): INSERTION PORT-A-CATH (N/A) as a surgical intervention .  The patient's history has been reviewed, patient examined, no change in status, stable for surgery.  I have reviewed the patient's chart and labs.  Questions were answered to the patient's satisfaction.     TOTH III,PAUL S

## 2017-04-24 NOTE — H&P (Signed)
Mckenzie Webb  Location: [place] [facility] Patient #: [id number] DOB: 12/19/1945 Married / Language: English / Race: White Female   History of Present Illness  The patient is a 71 year old female who presents for a follow-up for Breast cancer. the patient is a 71-year-old white female who is 2 weeks status post right breast lumpectomy and sentinel node mapping for a T1cN1mic right breast cancer that was ER positive and PR negative and HER-2 negative with a Ki-67 of 3%. She had a mammoprint that came back as high risk. Her oncologist has recommended chemotherapy and a Port-A-Cath. She tolerated the surgery well but is still having a lot of soreness in the right breast.   Problem List/Past Medical  MALIGNANT NEOPLASM OF CENTRAL PORTION OF RIGHT BREAST IN FEMALE, ESTROGEN RECEPTOR POSITIVE (C50.111)   Past Surgical History  Breast Biopsy  Right. Gallbladder Surgery - Laparoscopic  Knee Surgery  Bilateral. Oral Surgery  Lumpectomy 03/2017 Right.  Diagnostic Studies History  Colonoscopy  5-10 years ago Mammogram  within last year Pap Smear  1-5 years ago  Allergies  Iodine *CHEMICALS*  Sulfa 10 *OPHTHALMIC AGENTS*  Penicillin V *PENICILLINS*  Polysporin *DERMATOLOGICALS*  Ciprofloxacin *CHEMICALS*  Codeine Phosphate *ANALGESICS - OPIOID*   Medication History  Anastrozole (1MG Tablet, Oral) Active. Calcium-Vitamin D (600MG Capsule, Oral) Active. Vitamin D (1000UNIT Capsule, Oral) Active. Co-Enzyme Q-10 (30MG Capsule, Oral) Active. Vitamin B12 (Oral) Specific strength unknown - Active. Fish Oil Omega-3 (1000MG Capsule, Oral) Active. Levothyroxine Sodium (88MCG Tablet, Oral) Active. Losartan Potassium-HCTZ (50-12.5MG Tablet, Oral) Active. Omeprazole (40MG Capsule DR, Oral) Active. Red Yeast Rice Extract (Oral) Specific strength unknown - Active. Triamcinolone Acetonide (Top) (0.1% Cream, External) Active. Multiple Vitamin (1 (one) Oral)  Active. Diclofenac Sodium (75MG Tablet DR, Oral) Active. Medications Reconciled  Social History  Alcohol use  Occasional alcohol use. Caffeine use  Carbonated beverages, Coffee, Tea. No drug use  Tobacco use  Never smoker.  Family History  Alcohol Abuse  Family Members In General. Arthritis  Family Members In General, Mother, Sister. Cancer  Family Members In General. Cerebrovascular Accident  Sister. Colon Cancer  Family Members In General. Diabetes Mellitus  Sister. Heart Disease  Mother. Malignant Neoplasm Of Pancreas  Father. Respiratory Condition  Sister. Thyroid problems  Mother.  Pregnancy / Birth History Age of menopause  51-55 Contraceptive History  Oral contraceptives. Gravida  2 Irregular periods  Maternal age  21-25 Para  2  Other Problems  Anxiety Disorder  Arthritis  Back Pain  Breast Cancer  Cholelithiasis  Gastroesophageal Reflux Disease  General anesthesia - complications  Hemorrhoids  High blood pressure  Lump In Breast  Thyroid Disease  Ulcerative Colitis     Review of Systems  General Not Present- Appetite Loss, Chills, Fatigue, Fever, Night Sweats, Weight Gain and Weight Loss. Skin Present- Dryness. Not Present- Change in Wart/Mole, Hives, Jaundice, New Lesions, Non-Healing Wounds, Rash and Ulcer. HEENT Present- Wears glasses/contact lenses. Not Present- Earache, Hearing Loss, Hoarseness, Nose Bleed, Oral Ulcers, Ringing in the Ears, Seasonal Allergies, Sinus Pain, Sore Throat, Visual Disturbances and Yellow Eyes. Respiratory Present- Snoring. Not Present- Bloody sputum, Chronic Cough, Difficulty Breathing and Wheezing. Breast Not Present- Breast Mass, Breast Pain, Nipple Discharge and Skin Changes. Cardiovascular Not Present- Chest Pain, Difficulty Breathing Lying Down, Leg Cramps, Palpitations, Rapid Heart Rate, Shortness of Breath and Swelling of Extremities. Gastrointestinal Present- Hemorrhoids. Not  Present- Abdominal Pain, Bloating, Bloody Stool, Change in Bowel Habits, Chronic diarrhea, Constipation, Difficulty Swallowing, Excessive gas, Gets full   quickly at meals, Indigestion, Nausea, Rectal Pain and Vomiting. Female Genitourinary Not Present- Frequency, Nocturia, Painful Urination, Pelvic Pain and Urgency. Musculoskeletal Present- Back Pain. Not Present- Joint Pain, Joint Stiffness, Muscle Pain, Muscle Weakness and Swelling of Extremities. Neurological Not Present- Decreased Memory, Fainting, Headaches, Numbness, Seizures, Tingling, Tremor, Trouble walking and Weakness. Psychiatric Present- Anxiety. Not Present- Bipolar, Change in Sleep Pattern, Depression, Fearful and Frequent crying. Hematology Present- Easy Bruising. Not Present- Blood Thinners, Excessive bleeding, Gland problems, HIV and Persistent Infections.  Vitals Weight: 170.13 lb Height: 64.5in Body Surface Area: 1.84 m Body Mass Index: 28.75 kg/m  Pulse: 68 (Regular)  BP: 142/86 (Sitting, Left Arm, Standard)       Physical Exam  General Mental Status-Alert. General Appearance-Consistent with stated age. Hydration-Well hydrated. Voice-Normal.  Head and Neck Head-normocephalic, atraumatic with no lesions or palpable masses. Trachea-midline. Thyroid Gland Characteristics - normal size and consistency.  Eye Eyeball - Bilateral-Extraocular movements intact. Sclera/Conjunctiva - Bilateral-No scleral icterus.  Chest and Lung Exam Chest and lung exam reveals -quiet, even and easy respiratory effort with no use of accessory muscles and on auscultation, normal breath sounds, no adventitious sounds and normal vocal resonance. Inspection Chest Wall - Normal. Back - normal.  Breast Note: the right breast periareolar incision and axillary incision are healing nicely with no sign of infection or significant seroma   Cardiovascular Cardiovascular examination reveals -normal heart  sounds, regular rate and rhythm with no murmurs and normal pedal pulses bilaterally.  Abdomen Inspection Inspection of the abdomen reveals - No Hernias. Skin - Scar - no surgical scars. Palpation/Percussion Palpation and Percussion of the abdomen reveal - Soft, Non Tender, No Rebound tenderness, No Rigidity (guarding) and No hepatosplenomegaly. Auscultation Auscultation of the abdomen reveals - Bowel sounds normal.  Neurologic Neurologic evaluation reveals -alert and oriented x 3 with no impairment of recent or remote memory. Mental Status-Normal.  Musculoskeletal Normal Exam - Left-Upper Extremity Strength Normal and Lower Extremity Strength Normal. Normal Exam - Right-Upper Extremity Strength Normal and Lower Extremity Strength Normal.  Lymphatic Head & Neck  General Head & Neck Lymphatics: Bilateral - Description - Normal. Axillary  General Axillary Region: Bilateral - Description - Normal. Tenderness - Non Tender. Femoral & Inguinal  Generalized Femoral & Inguinal Lymphatics: Bilateral - Description - Normal. Tenderness - Non Tender.    Assessment & Plan  MALIGNANT NEOPLASM OF CENTRAL PORTION OF RIGHT BREAST IN FEMALE, ESTROGEN RECEPTOR POSITIVE (C50.111) Impression: the patient is about 2 weeks status post right breast lumpectomy for breast cancer. She tolerated the surgery well but is still having some soreness in the right breast. I think a sports bra may help with some of the soreness. She also had a mammoprint that was high risk. The oncologist has recommended chemotherapy and she will need a port for this. I have discussed with her in detail the risks and benefits of placing the port as well as some of the technical aspects and she understands and wishes to proceed Current Plans Follow up with us in the office in 3 MONTHS.  Call us sooner as needed.     

## 2017-04-24 NOTE — Discharge Instructions (Signed)

## 2017-04-24 NOTE — Anesthesia Postprocedure Evaluation (Signed)
Anesthesia Post Note  Patient: Mckenzie Webb  Procedure(s) Performed: Procedure(s) (LRB): INSERTION PORT-A-CATH (Left)     Anesthesia Post Evaluation  Last Vitals:  Vitals:   04/24/17 0953 04/24/17 1020  BP: (!) 153/73 (!) 169/76  Pulse: 62 65  Resp: 16 20  Temp: 36.4 C (!) 36.4 C  SpO2: 97% 99%    Last Pain:  Vitals:   04/24/17 1020  TempSrc:   PainSc: 0-No pain                 Mashonda Broski COKER

## 2017-04-24 NOTE — Anesthesia Preprocedure Evaluation (Addendum)
Anesthesia Evaluation  Patient identified by MRN, date of birth, ID band Patient awake    Reviewed: Allergy & Precautions, NPO status , Patient's Chart, lab work & pertinent test results  History of Anesthesia Complications (+) PONV  Airway Mallampati: II  TM Distance: >3 FB Neck ROM: Full    Dental  (+) Teeth Intact, Dental Advisory Given   Pulmonary    breath sounds clear to auscultation       Cardiovascular  Rhythm:Regular Rate:Normal     Neuro/Psych    GI/Hepatic   Endo/Other    Renal/GU      Musculoskeletal   Abdominal   Peds  Hematology   Anesthesia Other Findings   Reproductive/Obstetrics                            Anesthesia Physical Anesthesia Plan  ASA: III  Anesthesia Plan: General   Post-op Pain Management:    Induction: Intravenous  PONV Risk Score and Plan: Dexamethasone, Ondansetron and Propofol infusion  Airway Management Planned: LMA  Additional Equipment:   Intra-op Plan:   Post-operative Plan:   Informed Consent: I have reviewed the patients History and Physical, chart, labs and discussed the procedure including the risks, benefits and alternatives for the proposed anesthesia with the patient or authorized representative who has indicated his/her understanding and acceptance.   Dental advisory given  Plan Discussed with: CRNA and Anesthesiologist  Anesthesia Plan Comments:        Anesthesia Quick Evaluation

## 2017-04-24 NOTE — Anesthesia Procedure Notes (Signed)
Procedure Name: LMA Insertion Date/Time: 04/24/2017 7:46 AM Performed by: Carleene Cooper A Pre-anesthesia Checklist: Patient identified, Emergency Drugs available, Suction available, Patient being monitored and Timeout performed Patient Re-evaluated:Patient Re-evaluated prior to induction Oxygen Delivery Method: Circle system utilized Preoxygenation: Pre-oxygenation with 100% oxygen Induction Type: IV induction LMA: LMA with gastric port inserted LMA Size: 4.0 Number of attempts: 1 Placement Confirmation: positive ETCO2 and breath sounds checked- equal and bilateral Tube secured with: Tape Dental Injury: Teeth and Oropharynx as per pre-operative assessment

## 2017-04-24 NOTE — Op Note (Signed)
04/24/2017  8:33 AM  PATIENT:  Mckenzie Webb  71 y.o. female  PRE-OPERATIVE DIAGNOSIS:  right breast cancer  POST-OPERATIVE DIAGNOSIS:  right breast cancer  PROCEDURE:  Procedure(s): INSERTION PORT-A-CATH (Left)  SURGEON:  Surgeon(s) and Role:    * Jovita Kussmaul, MD - Primary  PHYSICIAN ASSISTANT:   ASSISTANTS: none   ANESTHESIA:   local and general  EBL:  Total I/O In: -  Out: 10 [Blood:10]  BLOOD ADMINISTERED:none  DRAINS: none   LOCAL MEDICATIONS USED:  MARCAINE     SPECIMEN:  No Specimen  DISPOSITION OF SPECIMEN:  N/A  COUNTS:  YES  TOURNIQUET:  * No tourniquets in log *  DICTATION: .Dragon Dictation   After informed consent was obtained the patient was brought to the operating room and placed in the supine position on the operating room table. After adequate induction of general anesthesia a roll was placed between the patient's shoulder blades to extend the shoulder slightly. The left chest and neck were then prepped with ChloraPrep, allowed to dry, and draped in usual sterile manner. The patient was placed in Trendelenburg position. The area lateral to the bend of the clavicle on the left chest was infiltrated with quarter percent Marcaine. A large bore needle from the Port-A-Cath kit was used to slide beneath the bend of the clavicle on the left chest heading towards the sternal notch and in doing so was able to access the left subclavian vein without difficulty. The wire was fed through the needle using the Seldinger technique without difficulty. The wire was confirmed in the central venous system using real-time fluoroscopy. Next a small incision was made on the left chest wall at the wire entry site. The incision was carried through the skin and subcutaneous tissue sharply with electrocautery. A subcutaneous pocket was created inferior to the incision using blunt finger dissection. Next the tubing was placed on the reservoir. The reservoir was placed in the  pocket and the length of the tubing was estimated using real-time fluoroscopy. The tubing was cut to the appropriate length. Next the sheath and dilator were fed over the wire using the Seldinger technique without difficulty. The wire and dilator were removed. The tubing was fed through the sheath as far as it would go and then held in place while the sheath was gently cracked and separated. Another real-time fluoroscopy image showed the tip of the catheter to be in the superior vena cava. The tubing was then permanently attached to the reservoir. The reservoir was anchored in the pocket with 2 2-0 Prolene stitches. The port was then aspirated and it aspirated blood easily.The port was then flushed initially with a dilute heparin solution and then with a more concentrated heparin solution. The subcutaneous tissue was closed over the port with interrupted 3-0 Vicryl stitches. The skin was then closed with a running 4-0 Monocryl subcuticular stitch. Dermabond dressings were applied. The patient tolerated the procedure well. At the end of the case all needle sponge and instrument counts were correct. The patient was then awakened and taken to recovery in stable condition.  PLAN OF CARE: Discharge to home after PACU  PATIENT DISPOSITION:  PACU - hemodynamically stable.   Delay start of Pharmacological VTE agent (>24hrs) due to surgical blood loss or risk of bleeding: not applicable

## 2017-04-25 ENCOUNTER — Other Ambulatory Visit: Payer: Self-pay

## 2017-04-25 DIAGNOSIS — Z170 Estrogen receptor positive status [ER+]: Principal | ICD-10-CM

## 2017-04-25 DIAGNOSIS — C50211 Malignant neoplasm of upper-inner quadrant of right female breast: Secondary | ICD-10-CM

## 2017-04-28 ENCOUNTER — Ambulatory Visit (HOSPITAL_BASED_OUTPATIENT_CLINIC_OR_DEPARTMENT_OTHER): Payer: Medicare Other

## 2017-04-28 ENCOUNTER — Ambulatory Visit: Payer: Medicare Other

## 2017-04-28 ENCOUNTER — Other Ambulatory Visit (HOSPITAL_BASED_OUTPATIENT_CLINIC_OR_DEPARTMENT_OTHER): Payer: Medicare Other

## 2017-04-28 ENCOUNTER — Encounter: Payer: Self-pay | Admitting: *Deleted

## 2017-04-28 ENCOUNTER — Other Ambulatory Visit: Payer: Self-pay | Admitting: Oncology

## 2017-04-28 VITALS — BP 173/78 | HR 65 | Temp 98.4°F | Resp 16

## 2017-04-28 DIAGNOSIS — Z170 Estrogen receptor positive status [ER+]: Principal | ICD-10-CM

## 2017-04-28 DIAGNOSIS — C50211 Malignant neoplasm of upper-inner quadrant of right female breast: Secondary | ICD-10-CM | POA: Diagnosis not present

## 2017-04-28 DIAGNOSIS — Z95828 Presence of other vascular implants and grafts: Secondary | ICD-10-CM

## 2017-04-28 DIAGNOSIS — Z5111 Encounter for antineoplastic chemotherapy: Secondary | ICD-10-CM

## 2017-04-28 LAB — CBC WITH DIFFERENTIAL/PLATELET
BASO%: 0 % (ref 0.0–2.0)
Basophils Absolute: 0 10*3/uL (ref 0.0–0.1)
EOS%: 0 % (ref 0.0–7.0)
Eosinophils Absolute: 0 10*3/uL (ref 0.0–0.5)
HCT: 39.3 % (ref 34.8–46.6)
HEMOGLOBIN: 13.5 g/dL (ref 11.6–15.9)
LYMPH#: 0.9 10*3/uL (ref 0.9–3.3)
LYMPH%: 6.3 % — ABNORMAL LOW (ref 14.0–49.7)
MCH: 31.8 pg (ref 25.1–34.0)
MCHC: 34.4 g/dL (ref 31.5–36.0)
MCV: 92.5 fL (ref 79.5–101.0)
MONO#: 0.7 10*3/uL (ref 0.1–0.9)
MONO%: 4.7 % (ref 0.0–14.0)
NEUT#: 12.5 10*3/uL — ABNORMAL HIGH (ref 1.5–6.5)
NEUT%: 89 % — ABNORMAL HIGH (ref 38.4–76.8)
PLATELETS: 203 10*3/uL (ref 145–400)
RBC: 4.25 10*6/uL (ref 3.70–5.45)
RDW: 12.8 % (ref 11.2–14.5)
WBC: 14 10*3/uL — ABNORMAL HIGH (ref 3.9–10.3)

## 2017-04-28 LAB — COMPREHENSIVE METABOLIC PANEL
ALBUMIN: 3.8 g/dL (ref 3.5–5.0)
ALK PHOS: 62 U/L (ref 40–150)
ALT: 36 U/L (ref 0–55)
ANION GAP: 11 meq/L (ref 3–11)
AST: 19 U/L (ref 5–34)
BILIRUBIN TOTAL: 0.42 mg/dL (ref 0.20–1.20)
BUN: 15 mg/dL (ref 7.0–26.0)
CHLORIDE: 106 meq/L (ref 98–109)
CO2: 22 mEq/L (ref 22–29)
CREATININE: 0.8 mg/dL (ref 0.6–1.1)
Calcium: 10.1 mg/dL (ref 8.4–10.4)
EGFR: 77 mL/min/{1.73_m2} — ABNORMAL LOW (ref >90)
Glucose: 142 mg/dl — ABNORMAL HIGH (ref 70–140)
Potassium: 3.5 mEq/L (ref 3.5–5.1)
Sodium: 139 mEq/L (ref 136–145)
TOTAL PROTEIN: 7.6 g/dL (ref 6.4–8.3)

## 2017-04-28 MED ORDER — SODIUM CHLORIDE 0.9% FLUSH
10.0000 mL | INTRAVENOUS | Status: DC | PRN
  Administered 2017-04-28: 10 mL
  Filled 2017-04-28: qty 10

## 2017-04-28 MED ORDER — SODIUM CHLORIDE 0.9 % IV SOLN
600.0000 mg/m2 | Freq: Once | INTRAVENOUS | Status: AC
  Administered 2017-04-28: 1120 mg via INTRAVENOUS
  Filled 2017-04-28: qty 56

## 2017-04-28 MED ORDER — PALONOSETRON HCL INJECTION 0.25 MG/5ML
INTRAVENOUS | Status: AC
  Filled 2017-04-28: qty 5

## 2017-04-28 MED ORDER — HEPARIN SOD (PORK) LOCK FLUSH 100 UNIT/ML IV SOLN
500.0000 [IU] | Freq: Once | INTRAVENOUS | Status: AC | PRN
  Administered 2017-04-28: 500 [IU]
  Filled 2017-04-28: qty 5

## 2017-04-28 MED ORDER — PALONOSETRON HCL INJECTION 0.25 MG/5ML
0.2500 mg | Freq: Once | INTRAVENOUS | Status: AC
  Administered 2017-04-28: 0.25 mg via INTRAVENOUS

## 2017-04-28 MED ORDER — SODIUM CHLORIDE 0.9 % IV SOLN: 10.0000 mg | Freq: Once | INTRAVENOUS | Status: DC

## 2017-04-28 MED ORDER — SODIUM CHLORIDE 0.9 % IV SOLN
Freq: Once | INTRAVENOUS | Status: AC
  Administered 2017-04-28: 11:00:00 via INTRAVENOUS

## 2017-04-28 MED ORDER — DOCETAXEL CHEMO INJECTION 160 MG/16ML
75.0000 mg/m2 | Freq: Once | INTRAVENOUS | Status: AC
  Administered 2017-04-28: 140 mg via INTRAVENOUS
  Filled 2017-04-28: qty 14

## 2017-04-28 MED ORDER — DEXAMETHASONE SODIUM PHOSPHATE 10 MG/ML IJ SOLN
10.0000 mg | Freq: Once | INTRAMUSCULAR | Status: AC
  Administered 2017-04-28: 10 mg via INTRAVENOUS

## 2017-04-28 MED ORDER — SODIUM CHLORIDE 0.9% FLUSH
10.0000 mL | Freq: Once | INTRAVENOUS | Status: AC
  Administered 2017-04-28: 10 mL
  Filled 2017-04-28: qty 10

## 2017-04-28 MED ORDER — DEXAMETHASONE SODIUM PHOSPHATE 10 MG/ML IJ SOLN
INTRAMUSCULAR | Status: AC
  Filled 2017-04-28: qty 1

## 2017-04-28 MED ORDER — PEGFILGRASTIM 6 MG/0.6ML ~~LOC~~ PSKT
6.0000 mg | PREFILLED_SYRINGE | Freq: Once | SUBCUTANEOUS | Status: AC
  Administered 2017-04-28: 6 mg via SUBCUTANEOUS
  Filled 2017-04-28: qty 0.6

## 2017-04-28 NOTE — Patient Instructions (Signed)
[facility] Discharge Instructions for Patients Receiving Chemotherapy  Today you received the following chemotherapy agents taxotere/cytoxan   To help prevent nausea and vomiting after your treatment, we encourage you to take your nausea medication as directed.   If you develop nausea and vomiting that is not controlled by your nausea medication, call the clinic.   BELOW ARE SYMPTOMS THAT SHOULD BE REPORTED IMMEDIATELY:  *FEVER GREATER THAN 100.5 F  *CHILLS WITH OR WITHOUT FEVER  NAUSEA AND VOMITING THAT IS NOT CONTROLLED WITH YOUR NAUSEA MEDICATION  *UNUSUAL SHORTNESS OF BREATH  *UNUSUAL BRUISING OR BLEEDING  TENDERNESS IN MOUTH AND THROAT WITH OR WITHOUT PRESENCE OF ULCERS  *URINARY PROBLEMS  *BOWEL PROBLEMS  UNUSUAL RASH Items with * indicate a potential emergency and should be followed up as soon as possible.  Feel free to call the clinic you have any questions or concerns. The clinic phone number is [phone].  Docetaxel injection What is this medicine? DOCETAXEL (doe se TAX el) is a chemotherapy drug. It targets fast dividing cells, like cancer cells, and causes these cells to die. This medicine is used to treat many types of cancers like breast cancer, certain stomach cancers, head and neck cancer, lung cancer, and prostate cancer. This medicine may be used for other purposes; ask your health care provider or pharmacist if you have questions. COMMON BRAND NAME(S): Docefrez, Taxotere What should I tell my health care provider before I take this medicine? They need to know if you have any of these conditions: -infection (especially a virus infection such as chickenpox, cold sores, or herpes) -liver disease -low blood counts, like low white cell, platelet, or red cell counts -an unusual or allergic reaction to docetaxel, polysorbate 80, other chemotherapy agents, other medicines, foods, dyes, or preservatives -pregnant or trying to get  pregnant -breast-feeding How should I use this medicine? This drug is given as an infusion into a vein. It is administered in a hospital or clinic by a specially trained health care professional. Talk to your pediatrician regarding the use of this medicine in children. Special care may be needed. Overdosage: If you think you have taken too much of this medicine contact a poison control center or emergency room at once. NOTE: This medicine is only for you. Do not share this medicine with others. What if I miss a dose? It is important not to miss your dose. Call your doctor or health care professional if you are unable to keep an appointment. What may interact with this medicine? -cyclosporine -erythromycin -ketoconazole -medicines to increase blood counts like filgrastim, pegfilgrastim, sargramostim -vaccines Talk to your doctor or health care professional before taking any of these medicines: -acetaminophen -aspirin -ibuprofen -ketoprofen -naproxen This list may not describe all possible interactions. Give your health care provider a list of all the medicines, herbs, non-prescription drugs, or dietary supplements you use. Also tell them if you smoke, drink alcohol, or use illegal drugs. Some items may interact with your medicine. What should I watch for while using this medicine? Your condition will be monitored carefully while you are receiving this medicine. You will need important blood work done while you are taking this medicine. This drug may make you feel generally unwell. This is not uncommon, as chemotherapy can affect healthy cells as well as cancer cells. Report any side effects. Continue your course of treatment even though you feel ill unless your doctor tells you to stop. In some cases, you may be given additional medicines to  help with side effects. Follow all directions for their use. Call your doctor or health care professional for advice if you get a fever, chills or sore  throat, or other symptoms of a cold or flu. Do not treat yourself. This drug decreases your body's ability to fight infections. Try to avoid being around people who are sick. This medicine may increase your risk to bruise or bleed. Call your doctor or health care professional if you notice any unusual bleeding. This medicine may contain alcohol in the product. You may get drowsy or dizzy. Do not drive, use machinery, or do anything that needs mental alertness until you know how this medicine affects you. Do not stand or sit up quickly, especially if you are an older patient. This reduces the risk of dizzy or fainting spells. Avoid alcoholic drinks. Do not become pregnant while taking this medicine. Women should inform their doctor if they wish to become pregnant or think they might be pregnant. There is a potential for serious side effects to an unborn child. Talk to your health care professional or pharmacist for more information. Do not breast-feed an infant while taking this medicine. What side effects may I notice from receiving this medicine? Side effects that you should report to your doctor or health care professional as soon as possible: -allergic reactions like skin rash, itching or hives, swelling of the face, lips, or tongue -low blood counts - This drug may decrease the number of white blood cells, red blood cells and platelets. You may be at increased risk for infections and bleeding. -signs of infection - fever or chills, cough, sore throat, pain or difficulty passing urine -signs of decreased platelets or bleeding - bruising, pinpoint red spots on the skin, black, tarry stools, nosebleeds -signs of decreased red blood cells - unusually weak or tired, fainting spells, lightheadedness -breathing problems -fast or irregular heartbeat -low blood pressure -mouth sores -nausea and vomiting -pain, swelling, redness or irritation at the injection site -pain, tingling, numbness in the hands or  feet -swelling of the ankle, feet, hands -weight gain Side effects that usually do not require medical attention (report to your doctor or health care professional if they continue or are bothersome): -bone pain -complete hair loss including hair on your head, underarms, pubic hair, eyebrows, and eyelashes -diarrhea -excessive tearing -changes in the color of fingernails -loosening of the fingernails -nausea -muscle pain -red flush to skin -sweating -weak or tired This list may not describe all possible side effects. Call your doctor for medical advice about side effects. You may report side effects to FDA at 1-800-FDA-1088. Where should I keep my medicine? This drug is given in a hospital or clinic and will not be stored at home. NOTE: This sheet is a summary. It may not cover all possible information. If you have questions about this medicine, talk to your doctor, pharmacist, or health care provider.  2018 [employer]/Gold Standard (2015-09-07 12:32:56)   Cyclophosphamide injection What is this medicine? CYCLOPHOSPHAMIDE (sye kloe FOSS fa mide) is a chemotherapy drug. It slows the growth of cancer cells. This medicine is used to treat many types of cancer like lymphoma, myeloma, leukemia, breast cancer, and ovarian cancer, to name a few. This medicine may be used for other purposes; ask your health care provider or pharmacist if you have questions. COMMON BRAND NAME(S): Cytoxan, Neosar What should I tell my health care provider before I take this medicine? They need to know if you have any of these conditions: -  blood disorders -history of other chemotherapy -infection -kidney disease -liver disease -recent or ongoing radiation therapy -tumors in the bone marrow -an unusual or allergic reaction to cyclophosphamide, other chemotherapy, other medicines, foods, dyes, or preservatives -pregnant or trying to get pregnant -breast-feeding How should I use this medicine? This drug is  usually given as an injection into a vein or muscle or by infusion into a vein. It is administered in a hospital or clinic by a specially trained health care professional. Talk to your pediatrician regarding the use of this medicine in children. Special care may be needed. Overdosage: If you think you have taken too much of this medicine contact a poison control center or emergency room at once. NOTE: This medicine is only for you. Do not share this medicine with others. What if I miss a dose? It is important not to miss your dose. Call your doctor or health care professional if you are unable to keep an appointment. What may interact with this medicine? This medicine may interact with the following medications: -amiodarone -amphotericin B -azathioprine -certain antiviral medicines for HIV or AIDS such as protease inhibitors (e.g., indinavir, ritonavir) and zidovudine -certain blood pressure medications such as benazepril, captopril, enalapril, fosinopril, lisinopril, moexipril, monopril, perindopril, quinapril, ramipril, trandolapril -certain cancer medications such as anthracyclines (e.g., daunorubicin, doxorubicin), busulfan, cytarabine, paclitaxel, pentostatin, tamoxifen, trastuzumab -certain diuretics such as chlorothiazide, chlorthalidone, hydrochlorothiazide, indapamide, metolazone -certain medicines that treat or prevent blood clots like warfarin -certain muscle relaxants such as succinylcholine -cyclosporine -etanercept -indomethacin -medicines to increase blood counts like filgrastim, pegfilgrastim, sargramostim -medicines used as general anesthesia -metronidazole -natalizumab This list may not describe all possible interactions. Give your health care provider a list of all the medicines, herbs, non-prescription drugs, or dietary supplements you use. Also tell them if you smoke, drink alcohol, or use illegal drugs. Some items may interact with your medicine. What should I watch for  while using this medicine? Visit your doctor for checks on your progress. This drug may make you feel generally unwell. This is not uncommon, as chemotherapy can affect healthy cells as well as cancer cells. Report any side effects. Continue your course of treatment even though you feel ill unless your doctor tells you to stop. Drink water or other fluids as directed. Urinate often, even at night. In some cases, you may be given additional medicines to help with side effects. Follow all directions for their use. Call your doctor or health care professional for advice if you get a fever, chills or sore throat, or other symptoms of a cold or flu. Do not treat yourself. This drug decreases your body's ability to fight infections. Try to avoid being around people who are sick. This medicine may increase your risk to bruise or bleed. Call your doctor or health care professional if you notice any unusual bleeding. Be careful brushing and flossing your teeth or using a toothpick because you may get an infection or bleed more easily. If you have any dental work done, tell your dentist you are receiving this medicine. You may get drowsy or dizzy. Do not drive, use machinery, or do anything that needs mental alertness until you know how this medicine affects you. Do not become pregnant while taking this medicine or for 1 year after stopping it. Women should inform their doctor if they wish to become pregnant or think they might be pregnant. Men should not father a child while taking this medicine and for 4 months after stopping it. There is  a potential for serious side effects to an unborn child. Talk to your health care professional or pharmacist for more information. Do not breast-feed an infant while taking this medicine. This medicine may interfere with the ability to have a child. This medicine has caused ovarian failure in some women. This medicine has caused reduced sperm counts in some men. You should talk  with your doctor or health care professional if you are concerned about your fertility. If you are going to have surgery, tell your doctor or health care professional that you have taken this medicine. What side effects may I notice from receiving this medicine? Side effects that you should report to your doctor or health care professional as soon as possible: -allergic reactions like skin rash, itching or hives, swelling of the face, lips, or tongue -low blood counts - this medicine may decrease the number of white blood cells, red blood cells and platelets. You may be at increased risk for infections and bleeding. -signs of infection - fever or chills, cough, sore throat, pain or difficulty passing urine -signs of decreased platelets or bleeding - bruising, pinpoint red spots on the skin, black, tarry stools, blood in the urine -signs of decreased red blood cells - unusually weak or tired, fainting spells, lightheadedness -breathing problems -dark urine -dizziness -palpitations -swelling of the ankles, feet, hands -trouble passing urine or change in the amount of urine -weight gain -yellowing of the eyes or skin Side effects that usually do not require medical attention (report to your doctor or health care professional if they continue or are bothersome): -changes in nail or skin color -hair loss -missed menstrual periods -mouth sores -nausea, vomiting This list may not describe all possible side effects. Call your doctor for medical advice about side effects. You may report side effects to FDA at 1-800-FDA-1088. Where should I keep my medicine? This drug is given in a hospital or clinic and will not be stored at home. NOTE: This sheet is a summary. It may not cover all possible information. If you have questions about this medicine, talk to your doctor, pharmacist, or health care provider.  2018 [employer]/Gold Standard (2012-06-19 16:22:58)

## 2017-04-29 ENCOUNTER — Other Ambulatory Visit: Payer: Self-pay

## 2017-04-29 ENCOUNTER — Encounter: Payer: Self-pay | Admitting: Oncology

## 2017-04-29 ENCOUNTER — Telehealth: Payer: Self-pay

## 2017-04-29 MED ORDER — METHADONE HCL 5 MG PO TABS: 5.0000 mg | ORAL_TABLET | Freq: Three times a day (TID) | ORAL | 0 refills | Status: DC

## 2017-04-29 MED ORDER — METHADONE HCL 10 MG PO TABS: 10.0000 mg | ORAL_TABLET | Freq: Three times a day (TID) | ORAL | 0 refills | Status: DC

## 2017-04-29 NOTE — Progress Notes (Signed)
Pt's Lorazepam was approved from 03/30/17 to 04/29/18.

## 2017-04-29 NOTE — Telephone Encounter (Signed)
Chemo f/u call documented in flowsheet

## 2017-04-29 NOTE — Progress Notes (Signed)
Submitted auth request for Lorazepam today.  Status is pending.

## 2017-05-02 ENCOUNTER — Ambulatory Visit: Payer: Medicare Other | Admitting: Oncology

## 2017-05-02 ENCOUNTER — Telehealth: Payer: Self-pay

## 2017-05-02 ENCOUNTER — Encounter (HOSPITAL_COMMUNITY): Payer: Self-pay | Admitting: Emergency Medicine

## 2017-05-02 ENCOUNTER — Emergency Department (HOSPITAL_COMMUNITY)
Admission: EM | Admit: 2017-05-02 | Discharge: 2017-05-03 | Disposition: A | Discharge status: Home / Self Care | Payer: Medicare Other | Attending: Emergency Medicine | Admitting: Emergency Medicine

## 2017-05-02 ENCOUNTER — Other Ambulatory Visit: Payer: Self-pay

## 2017-05-02 ENCOUNTER — Emergency Department (HOSPITAL_COMMUNITY): Payer: Medicare Other

## 2017-05-02 DIAGNOSIS — N390 Urinary tract infection, site not specified: Secondary | ICD-10-CM | POA: Insufficient documentation

## 2017-05-02 DIAGNOSIS — E876 Hypokalemia: Secondary | ICD-10-CM | POA: Insufficient documentation

## 2017-05-02 DIAGNOSIS — E860 Dehydration: Secondary | ICD-10-CM | POA: Insufficient documentation

## 2017-05-02 DIAGNOSIS — C50919 Malignant neoplasm of unspecified site of unspecified female breast: Secondary | ICD-10-CM | POA: Diagnosis not present

## 2017-05-02 DIAGNOSIS — Z170 Estrogen receptor positive status [ER+]: Principal | ICD-10-CM

## 2017-05-02 DIAGNOSIS — E039 Hypothyroidism, unspecified: Secondary | ICD-10-CM | POA: Insufficient documentation

## 2017-05-02 DIAGNOSIS — R531 Weakness: Secondary | ICD-10-CM | POA: Diagnosis present

## 2017-05-02 DIAGNOSIS — R51 Headache: Secondary | ICD-10-CM | POA: Diagnosis not present

## 2017-05-02 DIAGNOSIS — R05 Cough: Secondary | ICD-10-CM | POA: Diagnosis not present

## 2017-05-02 DIAGNOSIS — I10 Essential (primary) hypertension: Secondary | ICD-10-CM | POA: Diagnosis not present

## 2017-05-02 DIAGNOSIS — Z79899 Other long term (current) drug therapy: Secondary | ICD-10-CM | POA: Insufficient documentation

## 2017-05-02 DIAGNOSIS — C50211 Malignant neoplasm of upper-inner quadrant of right female breast: Secondary | ICD-10-CM

## 2017-05-02 LAB — URINALYSIS, ROUTINE W REFLEX MICROSCOPIC
Bilirubin Urine: NEGATIVE
Glucose, UA: NEGATIVE mg/dL
Ketones, ur: NEGATIVE mg/dL
Nitrite: POSITIVE — AB
PROTEIN: NEGATIVE mg/dL
SPECIFIC GRAVITY, URINE: 1.014 (ref 1.005–1.030)
pH: 6 (ref 5.0–8.0)

## 2017-05-02 LAB — I-STAT TROPONIN, ED: Troponin i, poc: 0.02 ng/mL (ref 0.00–0.08)

## 2017-05-02 LAB — COMPREHENSIVE METABOLIC PANEL
ALBUMIN: 3.4 g/dL — AB (ref 3.5–5.0)
ALT: 45 U/L (ref 14–54)
ANION GAP: 11 (ref 5–15)
AST: 25 U/L (ref 15–41)
Alkaline Phosphatase: 56 U/L (ref 38–126)
BILIRUBIN TOTAL: 0.7 mg/dL (ref 0.3–1.2)
BUN: 20 mg/dL (ref 6–20)
CO2: 26 mmol/L (ref 22–32)
Calcium: 8.7 mg/dL — ABNORMAL LOW (ref 8.9–10.3)
Chloride: 100 mmol/L — ABNORMAL LOW (ref 101–111)
Creatinine, Ser: 0.74 mg/dL (ref 0.44–1.00)
GFR calc Af Amer: >60 mL/min (ref >60)
GFR calc non Af Amer: >60 mL/min (ref >60)
GLUCOSE: 142 mg/dL — AB (ref 65–99)
Potassium: 2.9 mmol/L — ABNORMAL LOW (ref 3.5–5.1)
Sodium: 137 mmol/L (ref 135–145)
TOTAL PROTEIN: 6.2 g/dL — AB (ref 6.5–8.1)

## 2017-05-02 LAB — I-STAT CG4 LACTIC ACID, ED: Lactic Acid, Venous: 2.53 mmol/L (ref 0.5–1.9)

## 2017-05-02 LAB — MAGNESIUM: Magnesium: 2 mg/dL (ref 1.7–2.4)

## 2017-05-02 MED ORDER — METOCLOPRAMIDE HCL 5 MG/ML IJ SOLN
10.0000 mg | Freq: Once | INTRAMUSCULAR | Status: AC
  Administered 2017-05-02: 10 mg via INTRAVENOUS
  Filled 2017-05-02: qty 2

## 2017-05-02 MED ORDER — DIPHENHYDRAMINE HCL 50 MG/ML IJ SOLN
25.0000 mg | Freq: Once | INTRAMUSCULAR | Status: AC
  Administered 2017-05-02: 25 mg via INTRAVENOUS
  Filled 2017-05-02: qty 1

## 2017-05-02 MED ORDER — SODIUM CHLORIDE 0.9 % IV BOLUS (SEPSIS)
1000.0000 mL | Freq: Once | INTRAVENOUS | Status: AC
  Administered 2017-05-02: 1000 mL via INTRAVENOUS

## 2017-05-02 MED ORDER — DEXTROSE 5 % IV SOLN
1.0000 g | Freq: Once | INTRAVENOUS | Status: AC
  Administered 2017-05-02: 1 g via INTRAVENOUS
  Filled 2017-05-02: qty 10

## 2017-05-02 NOTE — ED Notes (Signed)
Nurse is going to access patient port and collect labs

## 2017-05-02 NOTE — Telephone Encounter (Signed)
Pt states she is a little better.  Her BP is now 131/67. She still has a small nagging headache. Suggested using tylenol which she has not taken since this AM, also use ice to the back of her neck.

## 2017-05-02 NOTE — ED Provider Notes (Addendum)
[facility] DEPT Provider Note   CSN: [id number] Arrival date & time: 05/02/17  2059     History   Chief Complaint Chief Complaint  Patient presents with  . Fatigue    HPI Mckenzie Webb is a 71 y.o. female.  HPI   19 y oF with PMHx as below here with generalized weakness. Pt has recently diagnosed right breast CA s/p recent port placement. She just finished her first treatment of chemo. Since then, she has been generally fatigued. She has been peeing more than usual as well but feels this is because she was drinking more water. Earlier today, pt was standing in a hot shower when she began to feel very lightheaded. She sat down and took her BP and it was 17-35A systolic. She called Dr. Reola Calkins office and drank several glasses of water, which helped. Several hours later, when walking to the restroom, she developed lightheadedness again and was noted to have BP 60/40 according to husband. She felt like she was going to pass out at the time. She subsequently came to the ER.  The only other associated sx she ahs had is persistent cough since placement of her Port. No pain or redness at the port site. No drainage.  Of note, sx began after pt took her antiHTN this afternoon.  Past Medical History:  Diagnosis Date  . Allergy   . DJD (degenerative joint disease), cervical 04 2002  . Edema    resolved  . Family history of colon cancer   . Family history of pancreatic cancer   . GERD (gastroesophageal reflux disease)   . Hyperlipidemia   . Hypothyroid   . Interstitial cystitis   . Leukopenia   . PONV (postoperative nausea and vomiting)   . Psoriasis     Patient Active Problem List   Diagnosis Date Noted  . Port catheter in place 04/28/2017  . Genetic testing 04/17/2017  . Family history of pancreatic cancer   . Family history of colon cancer   . Malignant neoplasm of upper-inner quadrant of right breast in female, estrogen receptor positive ([place]) 03/11/2017  .  Essential hypertension, benign 07/31/2015  . IFG (impaired fasting glucose) 05/03/2015  . Psoriasis 05/03/2014  . Greater trochanteric bursitis of right hip 03/14/2014  . Obesity (BMI 30-39.9) 05/26/2013  . GERD (gastroesophageal reflux disease) 12/31/2010  . Hypothyroidism 12/31/2010  . Vitamin D deficiency 12/31/2010  . Pure hypercholesterolemia 12/31/2010    Past Surgical History:  Procedure Laterality Date  . BREAST LUMPECTOMY WITH RADIOACTIVE SEED AND SENTINEL LYMPH NODE BIOPSY Right 03/26/2017   Procedure: BREAST LUMPECTOMY WITH RADIOACTIVE SEED AND SENTINEL LYMPH NODE BIOPSY;  Surgeon: Jovita Kussmaul, MD;  Location: [facility];  Service: General;  Laterality: Right;  . CHOLECYSTECTOMY    . COLONOSCOPY  3/09  . KNEE ARTHROSCOPY Right 1990  . KNEE ARTHROSCOPY Left 12/12/2010    (Dr. Noemi Chapel)  . PORTACATH PLACEMENT Left 04/24/2017   Procedure: INSERTION PORT-A-CATH;  Surgeon: Jovita Kussmaul, MD;  Location: [facility];  Service: General;  Laterality: Left;  . TUBAL LIGATION  1983  . VARICOSE VEIN SURGERY  09/2009 R, 06/2010 L    OB History    Gravida Para Term Preterm AB Living   2 2       2    SAB TAB Ectopic Multiple Live Births                   Home Medications    Prior to Admission  medications   Medication Sig Start Date End Date Taking? Authorizing Provider  acetaminophen (TYLENOL) 500 MG tablet Take 1,000 mg by mouth 3 (three) times daily as needed for moderate pain.   Yes [provider]  Calcium Carbonate-Vitamin D (CALCIUM 600+D) 600-400 MG-UNIT tablet Take 1 tablet by mouth daily.   Yes [provider]  Cholecalciferol (VITAMIN D) 1000 UNITS capsule Take 1,000 Units by mouth daily.     Yes [provider]  Coenzyme Q10 (COQ10) 100 MG CAPS Take 100 mg by mouth daily.   Yes [provider]  dexamethasone (DECADRON) 4 MG tablet Take 2 tablets (8 mg total) by mouth 2 (two) times daily. Start the day before Taxotere. Then  again the day after chemo for 3 days. 04/23/17  Yes Magrinat, Virgie Dad, MD  diclofenac (VOLTAREN) 75 MG EC tablet Take 75 mg by mouth 2 (two) times daily.   Yes [provider]  levothyroxine (SYNTHROID, LEVOTHROID) 88 MCG tablet Take 1 tablet (88 mcg total) by mouth daily. Patient taking differently: Take 88 mcg by mouth daily before breakfast. 30 minutes before eating anything 08/01/16  Yes Rita Ohara, MD  lidocaine-prilocaine (EMLA) cream Apply to affected area once 04/23/17  Yes Magrinat, Virgie Dad, MD  loratadine (CLARITIN) 10 MG tablet Take 10 mg by mouth daily as needed for allergies.    Yes [provider]  LORazepam (ATIVAN) 0.5 MG tablet Take 1 tablet (0.5 mg total) by mouth at bedtime as needed (Nausea or vomiting). 04/23/17  Yes Magrinat, Virgie Dad, MD  Multiple Vitamins-Minerals (ALIVE WOMENS 50+ PO) Take 1 tablet by mouth daily.    Yes [provider]  Omega-3 Fatty Acids (FISH OIL) 1200 MG CAPS Take 2,400 mg by mouth at bedtime.   Yes [provider]  omeprazole (PRILOSEC) 40 MG capsule TAKE ONE (1) CAPSULE EACH DAY 10/03/16  Yes Rita Ohara, MD  prochlorperazine (COMPAZINE) 10 MG tablet Take 1 tablet (10 mg total) by mouth every 6 (six) hours as needed (Nausea or vomiting). 04/23/17  Yes Magrinat, Virgie Dad, MD  Red Yeast Rice Extract (RED YEAST RICE PO) Take 1,200 mg by mouth 2 (two) times daily.    Yes [provider]  traMADol (ULTRAM) 50 MG tablet Take 1-2 tablets (50-100 mg total) by mouth every 6 (six) hours as needed. 04/24/17  Yes Autumn Messing III, MD  triamcinolone cream (KENALOG) 0.1 % Apply 1 application topically 2 (two) times daily. Patient taking differently: Apply 1 application topically 2 (two) times daily as needed (psoriasis).  09/21/15  Yes Rita Ohara, MD  vitamin B-12 (CYANOCOBALAMIN) 1000 MCG tablet Take 1,000 mcg by mouth daily.   Yes [provider]  anastrozole (ARIMIDEX) 1 MG tablet Take 1 tablet (1 mg total) by mouth daily.  03/12/17   Magrinat, Virgie Dad, MD  HYDROcodone-acetaminophen (NORCO/VICODIN) 5-325 MG tablet Take 1-2 tablets by mouth every 4 (four) hours as needed for moderate pain or severe pain. Patient not taking: Reported on 05/02/2017 04/24/17   Jovita Kussmaul, MD  losartan-hydrochlorothiazide [facility]) 50-12.5 MG tablet TAKE ONE (1) TABLET BY MOUTH EVERY DAY Patient not taking: Reported on 05/02/2017 07/31/16   Rita Ohara, MD    Family History Family History  Problem Relation Age of Onset  . Hypertension Mother   . Heart disease Mother   . Gallbladder disease Mother   . Thyroid disease Mother   . Arthritis Mother   . Diabetes Father   . Cancer Father  pancreatic  . Hypertension Sister   . Gallbladder disease Sister   . Diabetes Sister   . Diverticulitis Sister        of small intestine  . Cancer Son        testicular cancer  . Stroke Sister        late 58's  . Hypertension Sister   . Pulmonary embolism Sister   . Ulcerative colitis Sister   . COPD Brother        had lung lobe removed for suspected cancer, but was benign; smoker  . Colon cancer Other 79  . Lung cancer Cousin        maternal first cousin - smoker  . Breast cancer Neg Hx     Social History Social History  Substance Use Topics  . Smoking status: Never Smoker  . Smokeless tobacco: Never Used  . Alcohol use No     Comment: social, 1-2 times per year     Allergies   Iodine; Sulfa antibiotics; Penicillins; Polysporin [bacitracin-polymyxin b]; Ciprofloxacin; and Codeine   Review of Systems Review of Systems  Constitutional: Positive for fatigue.  Respiratory: Positive for cough and shortness of breath.   Genitourinary: Positive for frequency.  Neurological: Positive for weakness.  All other systems reviewed and are negative.    Physical Exam Updated Vital Signs BP 140/71 (BP Location: Left Arm)   Pulse 81   Temp 97.9 F (36.6 C) (Oral)   Resp 13   SpO2 97%   Physical Exam  Constitutional: She  appears well-developed and well-nourished. No distress.  HENT:  Head: Normocephalic and atraumatic.  Markedly dry MM  Eyes: Conjunctivae are normal.  Neck: Neck supple.  Cardiovascular: Normal rate, regular rhythm and normal heart sounds.  Exam reveals no friction rub.   No murmur heard. Pulmonary/Chest: Effort normal and breath sounds normal. No respiratory distress. She has no wheezes. She has no rales.  Abdominal: Soft. She exhibits no distension. There is tenderness (mild, suprapubic; no CVAT bilaterally).  Musculoskeletal: She exhibits no edema.  Skin: Skin is warm. Capillary refill takes less than 2 seconds.  Psychiatric: She has a normal mood and affect.  Nursing note and vitals reviewed.   Neurological Exam:  Mental Status: Alert and oriented to person, place, and time. Attention and concentration normal. Speech clear. Recent memory is intact. Cranial Nerves: Visual fields grossly intact. EOMI and PERRLA. No nystagmus noted. Facial sensation intact at forehead, maxillary cheek, and chin/mandible bilaterally. No facial asymmetry or weakness. Hearing grossly normal. Uvula is midline, and palate elevates symmetrically. Normal SCM and trapezius strength. Tongue midline without fasciculations. Motor: Muscle strength 5/5 in proximal and distal UE and LE bilaterally. No pronator drift. Muscle tone normal. Reflexes: 2+ and symmetrical in all four extremities.  Sensation: Intact to light touch in upper and lower extremities distally bilaterally.  Gait: Normal without ataxia. Coordination: Normal FTN bilaterally.    ED Treatments / Results  Labs (all labs ordered are listed, but only abnormal results are displayed) Labs Reviewed  CBC WITH DIFFERENTIAL/PLATELET - Abnormal; Notable for the following:       Result Value   WBC 2.0 (*)    Platelets 93 (*)    Neutro Abs 1.2 (*)    Lymphs Abs 0.6 (*)    Monocytes Absolute 0.0 (*)    All other components within normal limits    COMPREHENSIVE METABOLIC PANEL - Abnormal; Notable for the following:    Potassium 2.9 (*)    Chloride 100 (*)  Glucose, Bld 142 (*)    Calcium 8.7 (*)    Total Protein 6.2 (*)    Albumin 3.4 (*)    All other components within normal limits  URINALYSIS, ROUTINE W REFLEX MICROSCOPIC - Abnormal; Notable for the following:    APPearance HAZY (*)    Hgb urine dipstick SMALL (*)    Nitrite POSITIVE (*)    Leukocytes, UA LARGE (*)    Bacteria, UA RARE (*)    Squamous Epithelial / LPF 0-5 (*)    All other components within normal limits  I-STAT CG4 LACTIC ACID, ED - Abnormal; Notable for the following:    Lactic Acid, Venous 2.53 (*)    All other components within normal limits  URINE CULTURE  MAGNESIUM  I-STAT TROPONIN, ED  I-STAT CG4 LACTIC ACID, ED    EKG  EKG Interpretation None       Radiology Dg Chest 2 View  Result Date: 05/02/2017 CLINICAL DATA:  Initial evaluation for acute weakness, cough. EXAM: CHEST  2 VIEW COMPARISON:  Prior radiograph from 04/24/2017. FINDINGS: Left-sided Port-A-Cath. Cardiac and mediastinal silhouettes are stable, and remain within normal limits. Lungs mildly hypoinflated. Mild atelectatic changes at the left lung base. No focal infiltrates. No pulmonary edema or pleural effusion. No pneumothorax. No acute osseus abnormality. IMPRESSION: 1. Mild left basilar atelectasis. 2. No other active cardiopulmonary disease. Electronically Signed   By: Jeannine Boga M.D.   On: 05/02/2017 23:10    Procedures Procedures (including critical care time)  Medications Ordered in ED Medications  sodium chloride 0.9 % bolus 1,000 mL (0 mLs Intravenous Stopped 05/02/17 2355)  cefTRIAXone (ROCEPHIN) 1 g in dextrose 5 % 50 mL IVPB (0 g Intravenous Stopped 05/02/17 2347)  sodium chloride 0.9 % bolus 1,000 mL (0 mLs Intravenous Stopped 05/02/17 2355)  metoCLOPramide (REGLAN) injection 10 mg (10 mg Intravenous Given 05/02/17 2355)  diphenhydrAMINE (BENADRYL)  injection 25 mg (25 mg Intravenous Given 05/02/17 2355)  potassium chloride SA (K-DUR,KLOR-CON) CR tablet 40 mEq (40 mEq Oral Given 05/03/17 0009)  potassium chloride 10 mEq in 100 mL IVPB (10 mEq Intravenous New Bag/Given 05/03/17 0040)     Initial Impression / Assessment and Plan / ED Course  I have reviewed the triage vital signs and the nursing notes.  Pertinent labs & imaging results that were available during my care of the patient were reviewed by me and considered in my medical decision making (see chart for details).     71 yo F with h/o recently diagnosed T1N1 stage 1A breast CA, s/p first treatment session of cytoxan/docetaxel here with generalized weakness since chemotherapy session, also two episodes of transient, now resolved hypotension at home in setting of standing up.  Lab work reviewed as above. I suspect pt has generalized weakness 2/2 dehydration, complicated by UTI with subsequent urinary frequency. She is also hypokalemic, likely 2/2 dehydration/poor PO intake and HCTZ use - will likely need ongoing replacement. Of note, while in ED, pt c/o headache. While I suspect this is due to dehydration, pt has not had any imaging of head and has known node involvement of her breast CA. Will check CT to evaluate for metastatic disease (most concerned with subsequent edema after staring tx), continue fluids, and re-assess. LA mildly elevated but pt is afebrile, no hypotension, no signs of sepsis/sytemic infection at this time.  1:57 AM Pt feels improved on exam. Plan to f/u CT, re-check LA after fluids. CBC is also pending at this time. If CT unremarkable  and LA improved, I suspect pt will be candidate for outpt tx given that she is on Neulasta, is tolerating PO, with stable BP here. If she is neutropenic, hypotensive, likely admit.  Patient care transferred to Dr. Roxanne Mins at the end of my shift. Patient presentation, ED course, and plan of care discussed with review of all pertinent labs  and imaging. Please see his/her note for further details regarding further ED course and disposition.  Final Clinical Impressions(s) / ED Diagnoses   Final diagnoses:  Lower urinary tract infection  Dehydration  Hypokalemia    New Prescriptions New Prescriptions   No medications on file     Duffy Bruce, MD 05/03/17 Esmond Plants, MD 05/03/17 Rogene Houston

## 2017-05-02 NOTE — ED Notes (Signed)
RN will access port to draw labs.

## 2017-05-02 NOTE — Telephone Encounter (Signed)
Pt called that her bp bottomed out today. She was taking shower close to noon and got hot all over and sat down. When got dressed she checked her and her BP was 88/55. HR 72. She is feeling lightheaded. She did take her hyzaar this AM before she started feeling bad. She has had 2 c coffee a glass OJ and a bottle of water. Denies nausea. No palpitations. A little headache. No SOB.   Takes about 35 minutes to get here.   taxotere cytoxan on 9/10 Next lab MD 9/17  S/w Dr [name] and instructed pt to hold any more BP meds. Instructed her to push fluids for the next 2 hours and call back with update.

## 2017-05-02 NOTE — ED Triage Notes (Signed)
Patient c/o increased fatigue and weakness since receiving first chemo treatment on Monday. Hx breast cancer. States she was hypotensive earlier today. Denies N/V/D.

## 2017-05-02 NOTE — ED Notes (Signed)
RN going to access port and obtain blood specimens at that time.

## 2017-05-03 ENCOUNTER — Emergency Department (HOSPITAL_COMMUNITY): Payer: Medicare Other

## 2017-05-03 DIAGNOSIS — R51 Headache: Secondary | ICD-10-CM | POA: Diagnosis not present

## 2017-05-03 DIAGNOSIS — N390 Urinary tract infection, site not specified: Secondary | ICD-10-CM | POA: Diagnosis not present

## 2017-05-03 DIAGNOSIS — C50919 Malignant neoplasm of unspecified site of unspecified female breast: Secondary | ICD-10-CM | POA: Diagnosis not present

## 2017-05-03 LAB — CBC WITH DIFFERENTIAL/PLATELET
BASOS ABS: 0.1 10*3/uL (ref 0.0–0.1)
Basophils Relative: 3 %
EOS ABS: 0 10*3/uL (ref 0.0–0.7)
Eosinophils Relative: 1 %
HCT: 36.2 % (ref 36.0–46.0)
HEMOGLOBIN: 12.8 g/dL (ref 12.0–15.0)
Lymphocytes Relative: 32 %
Lymphs Abs: 0.6 10*3/uL — ABNORMAL LOW (ref 0.7–4.0)
MCH: 31.5 pg (ref 26.0–34.0)
MCHC: 35.4 g/dL (ref 30.0–36.0)
MCV: 89.2 fL (ref 78.0–100.0)
MONO ABS: 0 10*3/uL — AB (ref 0.1–1.0)
MONOS PCT: 1 %
NEUTROS ABS: 1.2 10*3/uL — AB (ref 1.7–7.7)
Neutrophils Relative %: 63 %
PLATELETS: 93 10*3/uL — AB (ref 150–400)
RBC: 4.06 MIL/uL (ref 3.87–5.11)
RDW: 12.5 % (ref 11.5–15.5)
WBC: 2 10*3/uL — ABNORMAL LOW (ref 4.0–10.5)

## 2017-05-03 LAB — I-STAT CG4 LACTIC ACID, ED: LACTIC ACID, VENOUS: 1.51 mmol/L (ref 0.5–1.9)

## 2017-05-03 MED ORDER — CEPHALEXIN 500 MG PO CAPS: 500.0000 mg | ORAL_CAPSULE | Freq: Four times a day (QID) | ORAL | 0 refills | Status: DC

## 2017-05-03 MED ORDER — POTASSIUM CHLORIDE CRYS ER 20 MEQ PO TBCR: 20.0000 meq | EXTENDED_RELEASE_TABLET | Freq: Two times a day (BID) | ORAL | 0 refills | Status: DC

## 2017-05-03 MED ORDER — POTASSIUM CHLORIDE 10 MEQ/100ML IV SOLN
10.0000 meq | Freq: Once | INTRAVENOUS | Status: AC
  Administered 2017-05-03: 10 meq via INTRAVENOUS
  Filled 2017-05-03 (×2): qty 100

## 2017-05-03 MED ORDER — HEPARIN SOD (PORK) LOCK FLUSH 100 UNIT/ML IV SOLN
500.0000 [IU] | Freq: Once | INTRAVENOUS | Status: AC
  Administered 2017-05-03: 500 [IU]
  Filled 2017-05-03: qty 5

## 2017-05-03 MED ORDER — POTASSIUM CHLORIDE CRYS ER 20 MEQ PO TBCR
40.0000 meq | EXTENDED_RELEASE_TABLET | Freq: Once | ORAL | Status: AC
  Administered 2017-05-03: 40 meq via ORAL
  Filled 2017-05-03: qty 2

## 2017-05-03 NOTE — Discharge Instructions (Signed)
Return if you have any problems.  When [place] your blood drawn on Monday or Tuesday, please have them check your potassium level.

## 2017-05-03 NOTE — ED Provider Notes (Signed)
Patient signed out to me pending CT scan of the head to look for evidence of metastatic disease because of headache. She presented with hypotension and found to have a urinary tract infection. She has been receiving cancer chemotherapy, but does have an adequate neutrophil count. She has received ceftriaxone in the emergency department. CT is negative for signs of metastatic disease. MRI can be obtained electively as an outpatient. She has received potassium supplementation for severe hypokalemia. She is feeling somewhat better although his does have mild residual headache. She is discharged with prescriptions for cephalexin and K Dur. She is scheduled to have CBC checked in several days. Advised to have potassium level rechecked at the same time.   Delora Fuel, MD 20/81/38 0300

## 2017-05-05 ENCOUNTER — Other Ambulatory Visit (HOSPITAL_BASED_OUTPATIENT_CLINIC_OR_DEPARTMENT_OTHER): Payer: Medicare Other

## 2017-05-05 ENCOUNTER — Ambulatory Visit (HOSPITAL_BASED_OUTPATIENT_CLINIC_OR_DEPARTMENT_OTHER): Payer: Medicare Other | Admitting: Adult Health

## 2017-05-05 ENCOUNTER — Encounter: Payer: Self-pay | Admitting: Adult Health

## 2017-05-05 ENCOUNTER — Telehealth: Payer: Self-pay | Admitting: Adult Health

## 2017-05-05 ENCOUNTER — Other Ambulatory Visit: Payer: Self-pay | Admitting: Oncology

## 2017-05-05 ENCOUNTER — Ambulatory Visit (HOSPITAL_BASED_OUTPATIENT_CLINIC_OR_DEPARTMENT_OTHER): Payer: Medicare Other

## 2017-05-05 VITALS — BP 147/59 | HR 76 | Temp 98.4°F | Resp 18 | Ht 64.0 in | Wt 171.2 lb

## 2017-05-05 VITALS — BP 143/66 | HR 72 | Temp 98.6°F | Resp 18

## 2017-05-05 DIAGNOSIS — Z170 Estrogen receptor positive status [ER+]: Principal | ICD-10-CM

## 2017-05-05 DIAGNOSIS — R5381 Other malaise: Secondary | ICD-10-CM

## 2017-05-05 DIAGNOSIS — C50211 Malignant neoplasm of upper-inner quadrant of right female breast: Secondary | ICD-10-CM

## 2017-05-05 DIAGNOSIS — M898X9 Other specified disorders of bone, unspecified site: Secondary | ICD-10-CM

## 2017-05-05 DIAGNOSIS — F418 Other specified anxiety disorders: Secondary | ICD-10-CM

## 2017-05-05 DIAGNOSIS — I427 Cardiomyopathy due to drug and external agent: Secondary | ICD-10-CM

## 2017-05-05 DIAGNOSIS — T451X5A Adverse effect of antineoplastic and immunosuppressive drugs, initial encounter: Secondary | ICD-10-CM

## 2017-05-05 LAB — COMPREHENSIVE METABOLIC PANEL
ALBUMIN: 3.2 g/dL — AB (ref 3.5–5.0)
ALK PHOS: 73 U/L (ref 40–150)
ALT: 47 U/L (ref 0–55)
AST: 26 U/L (ref 5–34)
Anion Gap: 7 mEq/L (ref 3–11)
BILIRUBIN TOTAL: 0.68 mg/dL (ref 0.20–1.20)
BUN: 5.6 mg/dL — AB (ref 7.0–26.0)
CO2: 27 meq/L (ref 22–29)
Calcium: 9.5 mg/dL (ref 8.4–10.4)
Chloride: 102 mEq/L (ref 98–109)
Creatinine: 0.7 mg/dL (ref 0.6–1.1)
EGFR: 89 mL/min/{1.73_m2} — ABNORMAL LOW (ref >90)
GLUCOSE: 85 mg/dL (ref 70–140)
POTASSIUM: 3.4 meq/L — AB (ref 3.5–5.1)
SODIUM: 136 meq/L (ref 136–145)
TOTAL PROTEIN: 6.5 g/dL (ref 6.4–8.3)

## 2017-05-05 LAB — URINE CULTURE: Culture: >=100000 — AB

## 2017-05-05 LAB — CBC WITH DIFFERENTIAL/PLATELET
BASO%: 0.4 % (ref 0.0–2.0)
Basophils Absolute: 0 10*3/uL (ref 0.0–0.1)
EOS%: 0.3 % (ref 0.0–7.0)
Eosinophils Absolute: 0 10*3/uL (ref 0.0–0.5)
HCT: 35.9 % (ref 34.8–46.6)
HEMOGLOBIN: 12.4 g/dL (ref 11.6–15.9)
LYMPH%: 15.2 % (ref 14.0–49.7)
MCH: 31.7 pg (ref 25.1–34.0)
MCHC: 34.4 g/dL (ref 31.5–36.0)
MCV: 92.1 fL (ref 79.5–101.0)
MONO#: 0.4 10*3/uL (ref 0.1–0.9)
MONO%: 3.7 % (ref 0.0–14.0)
NEUT#: 8.2 10*3/uL — ABNORMAL HIGH (ref 1.5–6.5)
NEUT%: 80.4 % — ABNORMAL HIGH (ref 38.4–76.8)
Platelets: 111 10*3/uL — ABNORMAL LOW (ref 145–400)
RBC: 3.9 10*6/uL (ref 3.70–5.45)
RDW: 12.6 % (ref 11.2–14.5)
WBC: 10.2 10*3/uL (ref 3.9–10.3)
lymph#: 1.5 10*3/uL (ref 0.9–3.3)

## 2017-05-05 MED ORDER — VENLAFAXINE HCL ER 75 MG PO CP24: 75.0000 mg | ORAL_CAPSULE | Freq: Every day | ORAL | 0 refills | Status: DC

## 2017-05-05 MED ORDER — NAPROXEN SODIUM 275 MG PO TABS: 550.0000 mg | ORAL_TABLET | Freq: Once | ORAL | Status: DC

## 2017-05-05 MED ORDER — SODIUM CHLORIDE 0.9% FLUSH
10.0000 mL | Freq: Once | INTRAVENOUS | Status: AC
  Administered 2017-05-05: 10 mL via INTRAVENOUS
  Filled 2017-05-05: qty 10

## 2017-05-05 MED ORDER — NAPROXEN 250 MG PO TABS
500.0000 mg | ORAL_TABLET | Freq: Once | ORAL | Status: AC
  Administered 2017-05-05: 500 mg via ORAL
  Filled 2017-05-05: qty 2

## 2017-05-05 MED ORDER — HEPARIN SOD (PORK) LOCK FLUSH 100 UNIT/ML IV SOLN
500.0000 [IU] | Freq: Once | INTRAVENOUS | Status: AC
  Administered 2017-05-05: 500 [IU] via INTRAVENOUS
  Filled 2017-05-05: qty 5

## 2017-05-05 MED ORDER — SODIUM CHLORIDE 0.9 % IV SOLN
INTRAVENOUS | Status: DC
  Administered 2017-05-05: 15:00:00 via INTRAVENOUS

## 2017-05-05 NOTE — Progress Notes (Signed)
DISCONTINUE OFF PATHWAY REGIMEN - Breast   OFF00004:Docetaxel + Cyclophosphamide (TC):   A cycle is every 21 days:     Docetaxel      Cyclophosphamide   **Always confirm dose/schedule in your pharmacy ordering system**    REASON: Toxicities / Adverse Event PRIOR TREATMENT: Off Pathway: Docetaxel + Cyclophosphamide (TC) TREATMENT RESPONSE: Unable to Evaluate  START ON PATHWAY REGIMEN - Breast   Dose-Dense AC q14 days:   A cycle is every 14 days:     Doxorubicin      Cyclophosphamide      [id number]-[id number]   **Always confirm dose/schedule in your pharmacy ordering system**    Paclitaxel 80 mg/m2 Weekly:   Administer weekly:     Paclitaxel   **Always confirm dose/schedule in your pharmacy ordering system**    Patient Characteristics: Postoperative without Neoadjuvant Therapy (Pathologic Staging), Invasive Disease, Adjuvant Therapy, HER2 Negative/Unknown/Equivocal, ER Positive, Node Negative, pT1a-c, pN0/N53m or pT2 or Higher, pN0, MammaPrint(R), High Genomic Risk Therapeutic Status: Postoperative without Neoadjuvant Therapy (Pathologic Staging) AJCC Grade: G1 AJCC N Category: pN1 AJCC M Category: cM0 ER Status: Positive (+) AJCC 8 Stage Grouping: IA HER2 Status: Negative (-) Oncotype Dx Recurrence Score: Ordered Other Genomic Test AJCC T Category: pT1c PR Status: Positive (+) Has this patient completed genomic testing<= Yes - MammaPrint(R) MammaPrint(R) Score: High Genomic Risk Intent of Therapy: Curative Intent, Discussed with Patient

## 2017-05-05 NOTE — Telephone Encounter (Signed)
Scheduled patient for IVF in [facility] per 9/17 los

## 2017-05-05 NOTE — Progress Notes (Signed)
RN visit for IV fluids.

## 2017-05-05 NOTE — Progress Notes (Signed)
[place]  [phone] [phone]     ID: Mckenzie Webb DOB: 1946/01/14  MR#: [id number]  XHB#:[id number]  Patient Care Team: Rita Ohara, MD as PCP - General (Family Medicine) Jovita Kussmaul, MD as Consulting Physician (General Surgery) Magrinat, Virgie Dad, MD as Consulting Physician (Oncology) Eppie Gibson, MD as Attending Physician (Radiation Oncology) Juanita Craver, MD as Consulting Physician (Gastroenterology) Vania Rea, MD as Consulting Physician (Obstetrics and Gynecology) Elsie Saas, MD as Consulting Physician (Orthopedic Surgery) Martinique, Amy, MD as Consulting Physician (Dermatology) Scot Dock, NP OTHER MD:  CHIEF COMPLAINT: Estrogen receptor positive breast cancer  CURRENT TREATMENT: Adjuvant chemotherapy   BREAST CANCER HISTORY: From the original intake note:  "Mckenzie Webb" had bilateral screening mammography [facility]. This showed a possible mass in the right breast. Right diagnostic mammography with tomography and ultrasonography 02/27/2017 found a breast density to be category B. In the subareolar right breast there was a 0.5 cm spiculated mass, which was not palpable. There was mild right nipple inversion, which per report was chronic. Ultrasonography confirmed a 0.5 cm retroareolar breast mass at the 1:00 radiant. The right axilla was sonographically benign.  On 03/03/2017 the patient underwent biopsy of the right breast mass in question. This showed (SAA [id number]) and invasive ductal carcinoma, grade 1, estrogen receptor 100% positive, progesterone receptor negative, with an MIB-1 of 3%, and no HER-2 implication, the signals ratio being 1.25 and the number per cell 2.00.  The patient's subsequent history is as detailed below.  INTERVAL HISTORY: Mckenzie Webb returns today for follow up and treatment of her estrogen receptor positive breast cancer.  She is accompanied by her husband Mckenzie Webb.  She  is not feeling well today.  She describes herself as feeling lousy.  She was doing well until after she received Neulasta.  She says after that she felt puny.    REVIEW OF SYSTEMS: Mckenzie Webb is concerned because her temperature is higher than it normally runs for her.  She has had an upset stomach today (? Related to antibiotics).  She has taken imodium twice.  Mckenzie Webb did not notice a decreased appetite following her treatment and says she has been eating well.    Mckenzie Webb did have to go to the emergency room on 9/14 PM due to her blood pressure bottoming out.  It started that morning, she called into our clinic and she was told to stop the losartan and push fluids, however, she did this and initially it improved, but at bedtime it went down again and she went into the ER.  She was diagnosed with hypokalemia at 2.9 and a UTI.  Her urine did grow E coli.  She has been taking antibiotics and received IV potassium while in the ER in addition to PO potassium supplements.    PAST MEDICAL HISTORY: Past Medical History:  Diagnosis Date  . Allergy   . DJD (degenerative joint disease), cervical 04 2002  . Edema    resolved  . Family history of colon cancer   . Family history of pancreatic cancer   . GERD (gastroesophageal reflux disease)   . Hyperlipidemia   . Hypothyroid   . Interstitial cystitis   . Leukopenia   . PONV (postoperative nausea and vomiting)   . Psoriasis     PAST SURGICAL HISTORY: Past Surgical History:  Procedure Laterality Date  . BREAST LUMPECTOMY WITH RADIOACTIVE SEED AND SENTINEL LYMPH NODE BIOPSY Right 03/26/2017   Procedure: BREAST LUMPECTOMY WITH RADIOACTIVE  SEED AND SENTINEL LYMPH NODE BIOPSY;  Surgeon: Jovita Kussmaul, MD;  Location: [facility];  Service: General;  Laterality: Right;  . CHOLECYSTECTOMY    . COLONOSCOPY  3/09  . KNEE ARTHROSCOPY Right 1990  . KNEE ARTHROSCOPY Left 12/12/2010    (Dr. Noemi Chapel)  . PORTACATH PLACEMENT Left 04/24/2017   Procedure:  INSERTION PORT-A-CATH;  Surgeon: Jovita Kussmaul, MD;  Location: [facility];  Service: General;  Laterality: Left;  . TUBAL LIGATION  1983  . VARICOSE VEIN SURGERY  09/2009 R, 06/2010 L    FAMILY HISTORY Family History  Problem Relation Age of Onset  . Hypertension Mother   . Heart disease Mother   . Gallbladder disease Mother   . Thyroid disease Mother   . Arthritis Mother   . Diabetes Father   . Cancer Father        pancreatic  . Hypertension Sister   . Gallbladder disease Sister   . Diabetes Sister   . Diverticulitis Sister        of small intestine  . Cancer Son        testicular cancer  . Stroke Sister        late 53's  . Hypertension Sister   . Pulmonary embolism Sister   . Ulcerative colitis Sister   . COPD Brother        had lung lobe removed for suspected cancer, but was benign; smoker  . Colon cancer Other 74  . Lung cancer Cousin        maternal first cousin - smoker  . Breast cancer Neg Hx   The patient's father died from pancreatic cancer at the age of 16. The patient's mother died from a myocardial infarction at age 82. The patient had one brother, 3 sisters. The patient's older son was diagnosed with testicular cancer at age 67. A maternal niece was diagnosed with colon cancer at age 4 and a maternal cousin with lung cancer at an unknown age.  GYNECOLOGIC HISTORY:  No LMP recorded. Patient is postmenopausal.  Menarche age 35, first live birth age 72, the patient is [place] P2. she went through the change of life approximately the year 2000. She did not take hormone replacement. She did take oral contraceptives for more than 20 years remotely, with no complications.   SOCIAL HISTORY:  She is a retired Web designer. Her husband Mckenzie Webb is an Clinical biochemist, still working part-time. Son Mckenzie Webb (from the patient's first marriage) lives in [place] and works in Therapist, art. Son Mckenzie Webb (from patient second marriage) lives in [place] we are is Comptroller. The  patient has one grandchild. She is not a Ambulance person.    ADVANCED DIRECTIVES: Not in place   HEALTH MAINTENANCE: Social History  Substance Use Topics  . Smoking status: Never Smoker  . Smokeless tobacco: Never Used  . Alcohol use No     Comment: social, 1-2 times per year     Colonoscopy:2009/[facility] and  PAP:  [facility], T score -0.1 (normal)   Allergies  Allergen Reactions  . Iodine Swelling  . Sulfa Antibiotics Swelling    Lips swell, itching  . Penicillins Rash    Has patient had a PCN reaction causing immediate rash, facial/tongue/throat swelling, SOB or lightheadedness with hypotension: No Has patient had a PCN reaction causing severe rash involving mucus membranes or skin necrosis: No Has patient had a PCN reaction that required hospitalization: No Has patient had a PCN  reaction occurring within the last 10 years: No If all of the above answers are "NO", then may proceed with Cephalosporin use.   [place]ell Coral [Bacitracin-Polymyxin B] Swelling    Used for eye infection--got redder and worse  . Ciprofloxacin     After she takes for a while it gives her a "funny feeling."  . Codeine Nausea Only    Current Outpatient Prescriptions  Medication Sig Dispense Refill  . acetaminophen (TYLENOL) 500 MG tablet Take 1,000 mg by mouth 3 (three) times daily as needed for moderate pain.    . Calcium Carbonate-Vitamin D (CALCIUM 600+D) 600-400 MG-UNIT tablet Take 1 tablet by mouth daily.    . Cholecalciferol (VITAMIN D) 1000 UNITS capsule Take 1,000 Units by mouth daily.      . Coenzyme Q10 (COQ10) 100 MG CAPS Take 100 mg by mouth daily.    . diclofenac (VOLTAREN) 75 MG EC tablet Take 75 mg by mouth 2 (two) times daily.    Marland Kitchen levothyroxine (SYNTHROID, LEVOTHROID) 88 MCG tablet Take 1 tablet (88 mcg total) by mouth daily. (Patient taking differently: Take 88 mcg by mouth daily before breakfast. 30 minutes before eating anything) 90 tablet 3  .  loratadine (CLARITIN) 10 MG tablet Take 10 mg by mouth daily as needed for allergies.     . Omega-3 Fatty Acids (FISH OIL) 1200 MG CAPS Take 2,400 mg by mouth at bedtime.    Marland Kitchen omeprazole (PRILOSEC) 40 MG capsule TAKE ONE (1) CAPSULE EACH DAY 90 capsule 2  . potassium chloride SA (K-DUR,KLOR-CON) 20 MEQ tablet Take 1 tablet (20 mEq total) by mouth 2 (two) times daily. 10 tablet 0  . Red Yeast Rice Extract (RED YEAST RICE PO) Take 1,200 mg by mouth 2 (two) times daily.     . traMADol (ULTRAM) 50 MG tablet Take 1-2 tablets (50-100 mg total) by mouth every 6 (six) hours as needed. 30 tablet 0  . vitamin B-12 (CYANOCOBALAMIN) 1000 MCG tablet Take 1,000 mcg by mouth daily.    Marland Kitchen anastrozole (ARIMIDEX) 1 MG tablet Take 1 tablet (1 mg total) by mouth daily. (Patient not taking: Reported on 05/05/2017) 90 tablet 4  . cephALEXin (KEFLEX) 500 MG capsule Take 1 capsule (500 mg total) by mouth 4 (four) times daily. 40 capsule 0  . Multiple Vitamins-Minerals (ALIVE WOMENS 50+ PO) Take 1 tablet by mouth daily.     Marland Kitchen venlafaxine XR (EFFEXOR-XR) 75 MG 24 hr capsule Take 1 capsule (75 mg total) by mouth daily with breakfast. 30 capsule 0   Current Facility-Administered Medications  Medication Dose Route Frequency Provider Last Rate Last Dose  . 0.9 %  sodium chloride infusion   Intravenous Continuous Gardenia Phlegm, NP 500 mL/hr at 05/05/17 1450    . naproxen sodium (ANAPROX) tablet 550 mg  550 mg Oral Once Glendene Wyer, Charlestine Massed, NP       Facility-Administered Medications Ordered in Other Visits  Medication Dose Route Frequency Provider Last Rate Last Dose  . heparin lock flush 100 unit/mL  500 Units Intravenous Once Magrinat, Virgie Dad, MD      . sodium chloride flush (NS) 0.9 % injection 10 mL  10 mL Intravenous Once Magrinat, Virgie Dad, MD        OBJECTIVE:  Vitals:   05/05/17 1356  BP: (!) 147/59  Pulse: 76  Resp: 18  Temp: 98.4 F (36.9 C)  SpO2: 98%     Body mass index is 29.39 kg/m.     Wt Readings  from Last 3 Encounters:  05/05/17 171 lb 3.2 oz (77.7 kg)  04/24/17 171 lb (77.6 kg)  04/23/17 171 lb (77.6 kg)      ECOG FS:0 - Asymptomatic GENERAL: Patient is a well appearing female who is tearful today throughout the visit HEENT:  Sclerae anicteric.  Oropharynx clear and moist. No ulcerations or evidence of oropharyngeal candidiasis. Neck is supple.  NODES:  No cervical, supraclavicular, or axillary lymphadenopathy palpated.  BREAST EXAM:  Deferred. LUNGS:  Clear to auscultation bilaterally.  No wheezes or rhonchi. HEART:  Regular rate and rhythm. No murmur appreciated. ABDOMEN:  Soft, nontender.  Positive, normoactive bowel sounds. No organomegaly palpated. MSK:  No focal spinal tenderness to palpation. Full range of motion bilaterally in the upper extremities. EXTREMITIES:  No peripheral edema.   SKIN:  Clear with no obvious rashes or skin changes. No nail dyscrasia. NEURO:  Nonfocal. Well oriented.  Appropriate affect.     LAB RESULTS:  CMP     Component Value Date/Time   NA 136 05/05/2017 1245   K 3.4 (L) 05/05/2017 1245   CL 100 (L) 05/02/2017 2301   CO2 27 05/05/2017 1245   GLUCOSE 85 05/05/2017 1245   BUN 5.6 (L) 05/05/2017 1245   CREATININE 0.7 05/05/2017 1245   CALCIUM 9.5 05/05/2017 1245   PROT 6.5 05/05/2017 1245   ALBUMIN 3.2 (L) 05/05/2017 1245   AST 26 05/05/2017 1245   ALT 47 05/05/2017 1245   ALKPHOS 73 05/05/2017 1245   BILITOT 0.68 05/05/2017 1245   GFRNONAA >60 05/02/2017 2301   GFRAA >60 05/02/2017 2301    No results found for: TOTALPROTELP, ALBUMINELP, A1GS, A2GS, BETS, BETA2SER, GAMS, MSPIKE, SPEI  No results found for: KPAFRELGTCHN, LAMBDASER, KAPLAMBRATIO  Lab Results  Component Value Date   WBC 10.2 05/05/2017   NEUTROABS 8.2 (H) 05/05/2017   HGB 12.4 05/05/2017   HCT 35.9 05/05/2017   MCV 92.1 05/05/2017   PLT 111 (L) 05/05/2017      Chemistry      Component Value Date/Time   NA 136 05/05/2017 1245   K 3.4 (L)  05/05/2017 1245   CL 100 (L) 05/02/2017 2301   CO2 27 05/05/2017 1245   BUN 5.6 (L) 05/05/2017 1245   CREATININE 0.7 05/05/2017 1245      Component Value Date/Time   CALCIUM 9.5 05/05/2017 1245   ALKPHOS 73 05/05/2017 1245   AST 26 05/05/2017 1245   ALT 47 05/05/2017 1245   BILITOT 0.68 05/05/2017 1245       No results found for: LABCA2  No components found for: YBOFBP102  No results for input(s): INR in the last 168 hours.  Urinalysis    Component Value Date/Time   COLORURINE YELLOW 05/02/2017 2201   APPEARANCEUR HAZY (A) 05/02/2017 2201   LABSPEC 1.014 05/02/2017 2201   PHURINE 6.0 05/02/2017 2201   GLUCOSEU NEGATIVE 05/02/2017 2201   HGBUR SMALL (A) 05/02/2017 2201   BILIRUBINUR NEGATIVE 05/02/2017 2201   BILIRUBINUR neg 08/17/2014 1123   KETONESUR NEGATIVE 05/02/2017 2201   PROTEINUR NEGATIVE 05/02/2017 2201   UROBILINOGEN negative 08/17/2014 1123   NITRITE POSITIVE (A) 05/02/2017 2201   LEUKOCYTESUR LARGE (A) 05/02/2017 2201     STUDIES: Dg Chest 2 View  Result Date: 05/02/2017 CLINICAL DATA:  Initial evaluation for acute weakness, cough. EXAM: CHEST  2 VIEW COMPARISON:  Prior radiograph from 04/24/2017. FINDINGS: Left-sided Port-A-Cath. Cardiac and mediastinal silhouettes are stable, and remain within normal limits. Lungs mildly hypoinflated. Mild atelectatic changes at  the left lung base. No focal infiltrates. No pulmonary edema or pleural effusion. No pneumothorax. No acute osseus abnormality. IMPRESSION: 1. Mild left basilar atelectasis. 2. No other active cardiopulmonary disease. Electronically Signed   By: Jeannine Boga M.D.   On: 05/02/2017 23:10   Ct Head Wo Contrast  Result Date: 05/03/2017 CLINICAL DATA:  Acute headache. Normal neurologic exam. Recently diagnosed with breast cancer. EXAM: CT HEAD WITHOUT CONTRAST TECHNIQUE: Contiguous axial images were obtained from the base of the skull through the vertex without intravenous contrast. COMPARISON:   None. FINDINGS: Brain: No intracranial hemorrhage, mass effect, or midline shift. No evidence of intracranial mass on noncontrast CT. No hydrocephalus. The basilar cisterns are patent. No evidence of territorial infarct or acute ischemia. No extra-axial or intracranial fluid collection. Vascular: No hyperdense vessel. Skull: No focal lesion.  No skull fracture. Sinuses/Orbits: Paranasal sinuses and mastoid air cells are clear. The visualized orbits are unremarkable. Other: None. IMPRESSION: 1.  No acute intracranial abnormality. 2. No evidence of intracranial metastatic disease or mass on noncontrast head CT. If there is persistent clinical concern, recommend brain MRI, preferably with contrast. Electronically Signed   By: Jeb Levering M.D.   On: 05/03/2017 02:31   Dg Chest Port 1 View  Result Date: 04/24/2017 CLINICAL DATA:  Port-A-Cath placement.  Breast cancer. EXAM: PORTABLE CHEST 1 VIEW COMPARISON:  One-view chest x-ray a scratched at two-view chest x-ray a 01/28/2017 FINDINGS: Heart size is exaggerated by low lung volumes. A left subclavian Port-A-Cath is in place. The tip is just above the cavoatrial junction. Left basilar airspace disease and pleural effusion are noted. Chronic splenic calcification is again noted. Surgical clips are present in the right breast or axilla. IMPRESSION: 1. Interval placement of left subclavian Port-A-Cath without radiographic evidence for complication. 2. Low lung volumes with left pleural effusion and basilar airspace disease. Infection or aspiration is not excluded. Electronically Signed   By: San Morelle M.D.   On: 04/24/2017 08:55   Dg C-arm 1-60 Min-no Report  Result Date: 04/24/2017 Fluoroscopy was utilized by the requesting physician.  No radiographic interpretation.    ELIGIBLE FOR AVAILABLE RESEARCH PROTOCOL: no  ASSESSMENT: 71 y.o. Mckenzie Webb woman status post right breast upper inner quadrant biopsy 03/04/2015 for a clinical T1a N0, stage IA  invasive ductal carcinoma, grade 1, estrogen receptor positive, progesterone receptor negative, with an MIB-1 of 3% and no HER-2 amplification  (1) anastrozole started neoadjuvantly 03/12/2017 in anticipation of possible surgical delays   (2) genetics testingNegative genetic testing on the common hereditary cancer panel.  The Hereditary Gene Panel offered by [facility] includes sequencing and/or deletion duplication testing of the following 46 genes: APC, ATM, AXIN2, BARD1, BMPR1A, BRCA1, BRCA2, BRIP1, CDH1, CDKN2A (p14ARF), CDKN2A (p16INK4a), CHEK2, CTNNA1, DICER1, EPCAM (Deletion/duplication testing only), GREM1 (promoter region deletion/duplication testing only), KIT, MEN1, MLH1, MSH2, MSH3, MSH6, MUTYH, NBN, NF1, NHTL1, PALB2, PDGFRA, PMS2, POLD1, POLE, PTEN, RAD50, RAD51C, RAD51D, SDHB, SDHC, SDHD, SMAD4, SMARCA4. STK11, TP53, TSC1, TSC2, and VHL.  The following genes were evaluated for sequence changes only: SDHA and HOXB13 c.251G>A variant only.  The report date is April 12, 2017.  (3) right lumpectomy and sentinel lymph node biopsy 03/26/2017 showed a pT1c pN1, stage IB invasive ductal carcinoma, grade 1, with negative margins  (4) Mammaprint returned high risk, indicating need for adjuvant chemotherapy  (5) chemotherapy will consist of Cytoxan and docetaxel given every 3 weeks 4 beginning on 04/28/2017, to change to Doxorubicin and Cyclophosphamide beginning 05/19/2017  (6) adjuvant  radiation to follow   (7) continue anti-estrogens to a total of 5 years.  PLAN:  Sharay is not feeling very well at all today.  She is very tearful throughout today's visit as well.  After discussion with Dr. [name], and review by him as well she will receive 1L of NS today.  She will stop her antibiotics (Keflex) after taking her remaining doses for today, and she will also not receive any further Docetaxel due to her inability to tolerate the side effects.  In two weeks we will plan on Doxorubicin and  Cyclophosphamide which was reviewed with her in great detail.  I have ordered the necessary echocardiogram today to be done within the week.  She was instructed to take Naproxen/Aleve 2 tab PO TID.  We will give her the first dose of this here, today.  Also, due to her emotions and anxiety, she will start Effexor 13m/day.  She will return in 2 weeks for labs, follow up, and Doxorubicin/Cyclophosphamide.    She knows to call uKoreaprior to her next appointment.     LScot Dock NP   05/05/2017 3:05 PM Medical Oncology and Hematology [facility] [address]Tel. [phone]   Fax. [phone]   ADDENDUM: PFraser Dinis overwhelmed from all the side effects that she had from her first cycle. Some of it is bony pain possibly mostly due to the Neulasta, but perhaps also to her first dose reaction to Taxotere. Certainly the malaise she has, the bad taste, and a feeling that her tongue is thick are going to be related to the Taxotere.  She is going to take Aleve, 2 tablets 3 times a day with food in addition to Tylenol to control the pain, and of course she will continue the potassium supplementation and additional 2 days now that she has just about resolved that problem. Since she had no urinary symptoms I think the Keflex can be stopped after tonight's dose, and I will be 3 days.  However I don't think we can return to Taxotere. I suggested we go ahead and treat her with Cytoxan and Adriamycin for 3 cycles. These could be given every 2 weeks which means she would finish a lot quicker. She is interested in pursuing this. She is aware that Adriamycin may affect the heart and she will be scheduled for an echocardiogram before her first dose  I am not planning to follow-up with weekly Taxol in her case.  I personally saw this patient and performed a substantive portion of this encounter with the listed APP documented above.   MChauncey Cruel MD Medical  Oncology and Hematology [facility] [id number]. [phone]   Fax. [phone]

## 2017-05-05 NOTE — Patient Instructions (Signed)
Dehydration, Adult Dehydration is a condition in which there is not enough fluid or water in the body. This happens when you lose more fluids than you take in. Important organs, such as the kidneys, brain, and heart, cannot function without a proper amount of fluids. Any loss of fluids from the body can lead to dehydration. Dehydration can range from mild to severe. This condition should be treated right away to prevent it from becoming severe. What are the causes? This condition may be caused by:  Vomiting.  Diarrhea.  Excessive sweating, such as from heat exposure or exercise.  Not drinking enough fluid, especially: ? When ill. ? While doing activity that requires a lot of energy.  Excessive urination.  Fever.  Infection.  Certain medicines, such as medicines that cause the body to lose excess fluid (diuretics).  Inability to access safe drinking water.  Reduced physical ability to get adequate water and food.  What increases the risk? This condition is more likely to develop in people:  Who have a poorly controlled long-term (chronic) illness, such as diabetes, heart disease, or kidney disease.  Who are age 84 or older.  Who are disabled.  Who live in a place with high altitude.  Who play endurance sports.  What are the signs or symptoms? Symptoms of mild dehydration may include:  Thirst.  Dry lips.  Slightly dry mouth.  Dry, warm skin.  Dizziness. Symptoms of moderate dehydration may include:  Very dry mouth.  Muscle cramps.  Dark urine. Urine may be the color of tea.  Decreased urine production.  Decreased tear production.  Heartbeat that is irregular or faster than normal (palpitations).  Headache.  Light-headedness, especially when you stand up from a sitting position.  Fainting (syncope). Symptoms of severe dehydration may include:  Changes in skin, such as: ? Cold and clammy skin. ? Blotchy (mottled) or pale skin. ? Skin that does  not quickly return to normal after being lightly pinched and released (poor skin turgor).  Changes in body fluids, such as: ? Extreme thirst. ? No tear production. ? Inability to sweat when body temperature is high, such as in hot weather. ? Very little urine production.  Changes in vital signs, such as: ? Weak pulse. ? Pulse that is more than 100 beats a minute when sitting still. ? Rapid breathing. ? Low blood pressure.  Other changes, such as: ? Sunken eyes. ? Cold hands and feet. ? Confusion. ? Lack of energy (lethargy). ? Difficulty waking up from sleep. ? Short-term weight loss. ? Unconsciousness. How is this diagnosed? This condition is diagnosed based on your symptoms and a physical exam. Blood and urine tests may be done to help confirm the diagnosis. How is this treated? Treatment for this condition depends on the severity. Mild or moderate dehydration can often be treated at home. Treatment should be started right away. Do not wait until dehydration becomes severe. Severe dehydration is an emergency and it needs to be treated in a hospital. Treatment for mild dehydration may include:  Drinking more fluids.  Replacing salts and minerals in your blood (electrolytes) that you may have lost. Treatment for moderate dehydration may include:  Drinking an oral rehydration solution (ORS). This is a drink that helps you replace fluids and electrolytes (rehydrate). It can be found at pharmacies and retail stores. Treatment for severe dehydration may include:  Receiving fluids through an IV tube.  Receiving an electrolyte solution through a feeding tube that is passed through your nose  and into your stomach (nasogastric tube, or NG tube).  Correcting any abnormalities in electrolytes.  Treating the underlying cause of dehydration. Follow these instructions at home:  If directed by your health care provider, drink an ORS: ? Make an ORS by following instructions on the  package. ? Start by drinking small amounts, about  cup (120 mL) every 5-10 minutes. ? Slowly increase how much you drink until you have taken the amount recommended by your health care provider.  Drink enough clear fluid to keep your urine clear or pale yellow. If you were told to drink an ORS, finish the ORS first, then start slowly drinking other clear fluids. Drink fluids such as: ? Water. Do not drink only water. Doing that can lead to having too little salt (sodium) in the body (hyponatremia). ? Ice chips. ? Fruit juice that you have added water to (diluted fruit juice). ? Low-calorie sports drinks.  Avoid: ? Alcohol. ? Drinks that contain a lot of sugar. These include high-calorie sports drinks, fruit juice that is not diluted, and soda. ? Caffeine. ? Foods that are greasy or contain a lot of fat or sugar.  Take over-the-counter and prescription medicines only as told by your health care provider.  Do not take sodium tablets. This can lead to having too much sodium in the body (hypernatremia).  Eat foods that contain a healthy balance of electrolytes, such as bananas, oranges, potatoes, tomatoes, and spinach.  Keep all follow-up visits as told by your health care provider. This is important. Contact a health care provider if:  You have abdominal pain that: ? Gets worse. ? Stays in one area (localizes).  You have a rash.  You have a stiff neck.  You are more irritable than usual.  You are sleepier or more difficult to wake up than usual.  You feel weak or dizzy.  You feel very thirsty.  You have urinated only a small amount of very dark urine over 6-8 hours. Get help right away if:  You have symptoms of severe dehydration.  You cannot drink fluids without vomiting.  Your symptoms get worse with treatment.  You have a fever.  You have a severe headache.  You have vomiting or diarrhea that: ? Gets worse. ? Does not go away.  You have blood or green matter  (bile) in your vomit.  You have blood in your stool. This may cause stool to look black and tarry.  You have not urinated in 6-8 hours.  You faint.  Your heart rate while sitting still is over 100 beats a minute.  You have trouble breathing. This information is not intended to replace advice given to you by your health care provider. Make sure you discuss any questions you have with your health care provider. Document Released: 08/05/2005 Document Revised: 03/01/2016 Document Reviewed: 09/29/2015 Elsevier Interactive Patient Education  [employer].

## 2017-05-06 ENCOUNTER — Telehealth: Payer: Self-pay | Admitting: Oncology

## 2017-05-06 ENCOUNTER — Telehealth: Payer: Self-pay | Admitting: Emergency Medicine

## 2017-05-06 NOTE — Telephone Encounter (Signed)
Scheduled apt per sch message 9/17 - made sure ECHO was scheduled left message for patient of echo appt date and time.

## 2017-05-06 NOTE — Telephone Encounter (Signed)
Post ED Visit - Positive Culture Follow-up  Culture report reviewed by antimicrobial stewardship pharmacist:  []  Elenor Quinones, Pharm.D. []  Heide Guile, Pharm.D., BCPS AQ-ID []  Parks Neptune, Pharm.D., BCPS []  Alycia Rossetti, Pharm.D., BCPS []  [place], Pharm.D., BCPS, AAHIVP []  Legrand Como, Pharm.D., BCPS, AAHIVP []  Salome Arnt, PharmD, BCPS []  Dimitri Ped, PharmD, BCPS [x]  Vincenza Hews, PharmD, BCPS  Positive urine culture Treated with cephalexin, organism sensitive to the same and no further patient follow-up is required at this time.  Hazle Nordmann 05/06/2017, 12:16 PM

## 2017-05-09 ENCOUNTER — Telehealth: Payer: Self-pay | Admitting: *Deleted

## 2017-05-09 ENCOUNTER — Other Ambulatory Visit: Payer: Self-pay | Admitting: Adult Health

## 2017-05-09 DIAGNOSIS — H1031 Unspecified acute conjunctivitis, right eye: Secondary | ICD-10-CM

## 2017-05-09 MED ORDER — TOBRAMYCIN-DEXAMETHASONE 0.3-0.1 % OP SUSP: 2.0000 [drp] | OPHTHALMIC | 0 refills | Status: DC

## 2017-05-09 NOTE — Telephone Encounter (Signed)
Pt.notified

## 2017-05-09 NOTE — Telephone Encounter (Signed)
I sent in tobradex eye gtts for patient to use at [facility] in [place].  She should call us if she notes any significant eye pain, blurred vision, double vision, or worsening in her condition.    Thanks,  Wilber Bihari, NP

## 2017-05-09 NOTE — Telephone Encounter (Signed)
This RN spoke with pt per her call stating she has developed redness, burning and water discharge from her right eye. " feels like pink eye"  She has not used any eyedrops or other " because I wasn't sure if I should "  Pt has completed oral antibiotics ordered previously.  This RN will inquire with NP [facility] per above ( seen by her earlier this week ) for appropriate interventions.

## 2017-05-13 ENCOUNTER — Ambulatory Visit (HOSPITAL_COMMUNITY)
Admission: RE | Admit: 2017-05-13 | Discharge: 2017-05-13 | Disposition: A | Discharge status: Home / Self Care | Payer: Medicare Other | Source: Ambulatory Visit | Attending: Adult Health | Admitting: Adult Health

## 2017-05-13 DIAGNOSIS — I427 Cardiomyopathy due to drug and external agent: Secondary | ICD-10-CM

## 2017-05-13 DIAGNOSIS — E785 Hyperlipidemia, unspecified: Secondary | ICD-10-CM | POA: Insufficient documentation

## 2017-05-13 DIAGNOSIS — T451X5A Adverse effect of antineoplastic and immunosuppressive drugs, initial encounter: Secondary | ICD-10-CM

## 2017-05-13 DIAGNOSIS — C50211 Malignant neoplasm of upper-inner quadrant of right female breast: Secondary | ICD-10-CM | POA: Diagnosis not present

## 2017-05-13 DIAGNOSIS — Z170 Estrogen receptor positive status [ER+]: Secondary | ICD-10-CM

## 2017-05-13 DIAGNOSIS — I340 Nonrheumatic mitral (valve) insufficiency: Secondary | ICD-10-CM | POA: Insufficient documentation

## 2017-05-13 NOTE — Progress Notes (Signed)
  Echocardiogram 2D Echocardiogram has been performed.  Mckenzie Webb M 05/13/2017, 11:54 AM

## 2017-05-19 ENCOUNTER — Other Ambulatory Visit (HOSPITAL_BASED_OUTPATIENT_CLINIC_OR_DEPARTMENT_OTHER): Payer: Medicare Other

## 2017-05-19 ENCOUNTER — Ambulatory Visit (HOSPITAL_BASED_OUTPATIENT_CLINIC_OR_DEPARTMENT_OTHER): Payer: Medicare Other | Admitting: Adult Health

## 2017-05-19 ENCOUNTER — Ambulatory Visit: Payer: Medicare Other

## 2017-05-19 ENCOUNTER — Telehealth: Payer: Self-pay

## 2017-05-19 ENCOUNTER — Encounter: Payer: Self-pay | Admitting: *Deleted

## 2017-05-19 ENCOUNTER — Encounter: Payer: Self-pay | Admitting: Adult Health

## 2017-05-19 ENCOUNTER — Ambulatory Visit (HOSPITAL_BASED_OUTPATIENT_CLINIC_OR_DEPARTMENT_OTHER): Payer: Medicare Other

## 2017-05-19 VITALS — BP 163/74 | HR 75 | Temp 98.1°F | Resp 18 | Ht 64.0 in | Wt 172.1 lb

## 2017-05-19 DIAGNOSIS — Z79811 Long term (current) use of aromatase inhibitors: Secondary | ICD-10-CM | POA: Diagnosis not present

## 2017-05-19 DIAGNOSIS — Z170 Estrogen receptor positive status [ER+]: Secondary | ICD-10-CM | POA: Diagnosis not present

## 2017-05-19 DIAGNOSIS — C50211 Malignant neoplasm of upper-inner quadrant of right female breast: Secondary | ICD-10-CM

## 2017-05-19 DIAGNOSIS — T451X5A Adverse effect of antineoplastic and immunosuppressive drugs, initial encounter: Secondary | ICD-10-CM

## 2017-05-19 DIAGNOSIS — Z5111 Encounter for antineoplastic chemotherapy: Secondary | ICD-10-CM | POA: Diagnosis not present

## 2017-05-19 DIAGNOSIS — I427 Cardiomyopathy due to drug and external agent: Secondary | ICD-10-CM

## 2017-05-19 DIAGNOSIS — Z95828 Presence of other vascular implants and grafts: Secondary | ICD-10-CM

## 2017-05-19 LAB — COMPREHENSIVE METABOLIC PANEL
ALBUMIN: 3.6 g/dL (ref 3.5–5.0)
ALK PHOS: 64 U/L (ref 40–150)
ALT: 20 U/L (ref 0–55)
ANION GAP: 10 meq/L (ref 3–11)
AST: 16 U/L (ref 5–34)
BILIRUBIN TOTAL: 0.35 mg/dL (ref 0.20–1.20)
BUN: 13.7 mg/dL (ref 7.0–26.0)
CO2: 21 mEq/L — ABNORMAL LOW (ref 22–29)
CREATININE: 0.8 mg/dL (ref 0.6–1.1)
Calcium: 9.3 mg/dL (ref 8.4–10.4)
Chloride: 108 mEq/L (ref 98–109)
EGFR: 80 mL/min/{1.73_m2} — AB (ref >90)
Glucose: 163 mg/dl — ABNORMAL HIGH (ref 70–140)
Potassium: 3.8 mEq/L (ref 3.5–5.1)
Sodium: 140 mEq/L (ref 136–145)
TOTAL PROTEIN: 7.1 g/dL (ref 6.4–8.3)

## 2017-05-19 LAB — CBC WITH DIFFERENTIAL/PLATELET
BASO%: 0 % (ref 0.0–2.0)
BASOS ABS: 0 10*3/uL (ref 0.0–0.1)
EOS ABS: 0 10*3/uL (ref 0.0–0.5)
EOS%: 0 % (ref 0.0–7.0)
HEMATOCRIT: 36.3 % (ref 34.8–46.6)
HEMOGLOBIN: 12.2 g/dL (ref 11.6–15.9)
LYMPH#: 0.7 10*3/uL — AB (ref 0.9–3.3)
LYMPH%: 4.9 % — ABNORMAL LOW (ref 14.0–49.7)
MCH: 31.9 pg (ref 25.1–34.0)
MCHC: 33.6 g/dL (ref 31.5–36.0)
MCV: 94.8 fL (ref 79.5–101.0)
MONO#: 0.7 10*3/uL (ref 0.1–0.9)
MONO%: 5.1 % (ref 0.0–14.0)
NEUT%: 90 % — ABNORMAL HIGH (ref 38.4–76.8)
NEUTROS ABS: 13.1 10*3/uL — AB (ref 1.5–6.5)
PLATELETS: 279 10*3/uL (ref 145–400)
RBC: 3.83 10*6/uL (ref 3.70–5.45)
RDW: 14.1 % (ref 11.2–14.5)
WBC: 14.6 10*3/uL — AB (ref 3.9–10.3)

## 2017-05-19 MED ORDER — DOXORUBICIN HCL CHEMO IV INJECTION 2 MG/ML
60.0000 mg/m2 | Freq: Once | INTRAVENOUS | Status: AC
  Administered 2017-05-19: 112 mg via INTRAVENOUS
  Filled 2017-05-19: qty 56

## 2017-05-19 MED ORDER — PEGFILGRASTIM 6 MG/0.6ML ~~LOC~~ PSKT
6.0000 mg | PREFILLED_SYRINGE | Freq: Once | SUBCUTANEOUS | Status: AC
  Administered 2017-05-19: 6 mg via SUBCUTANEOUS
  Filled 2017-05-19: qty 0.6

## 2017-05-19 MED ORDER — SODIUM CHLORIDE 0.9% FLUSH
10.0000 mL | INTRAVENOUS | Status: DC | PRN
Start: 2017-05-19 — End: 2017-05-19
  Administered 2017-05-19: 10 mL
  Filled 2017-05-19: qty 10

## 2017-05-19 MED ORDER — SODIUM CHLORIDE 0.9 % IV SOLN
Freq: Once | INTRAVENOUS | Status: AC
  Administered 2017-05-19: 12:00:00 via INTRAVENOUS

## 2017-05-19 MED ORDER — HEPARIN SOD (PORK) LOCK FLUSH 100 UNIT/ML IV SOLN
500.0000 [IU] | Freq: Once | INTRAVENOUS | Status: AC | PRN
  Administered 2017-05-19: 500 [IU]
  Filled 2017-05-19: qty 5

## 2017-05-19 MED ORDER — SODIUM CHLORIDE 0.9 % IV SOLN
Freq: Once | INTRAVENOUS | Status: AC
  Administered 2017-05-19: 13:00:00 via INTRAVENOUS
  Filled 2017-05-19: qty 5

## 2017-05-19 MED ORDER — PALONOSETRON HCL INJECTION 0.25 MG/5ML
INTRAVENOUS | Status: AC
  Filled 2017-05-19: qty 5

## 2017-05-19 MED ORDER — PROCHLORPERAZINE MALEATE 10 MG PO TABS: 10.0000 mg | ORAL_TABLET | Freq: Four times a day (QID) | ORAL | 1 refills | Status: DC | PRN

## 2017-05-19 MED ORDER — ONDANSETRON HCL 8 MG PO TABS: 8.0000 mg | ORAL_TABLET | Freq: Two times a day (BID) | ORAL | 1 refills | Status: DC | PRN

## 2017-05-19 MED ORDER — LORAZEPAM 0.5 MG PO TABS: 0.5000 mg | ORAL_TABLET | Freq: Four times a day (QID) | ORAL | 0 refills | Status: DC | PRN

## 2017-05-19 MED ORDER — PALONOSETRON HCL INJECTION 0.25 MG/5ML
0.2500 mg | Freq: Once | INTRAVENOUS | Status: AC
  Administered 2017-05-19: 0.25 mg via INTRAVENOUS

## 2017-05-19 MED ORDER — SODIUM CHLORIDE 0.9% FLUSH
10.0000 mL | Freq: Once | INTRAVENOUS | Status: AC
  Administered 2017-05-19: 10 mL
  Filled 2017-05-19: qty 10

## 2017-05-19 MED ORDER — SODIUM CHLORIDE 0.9 % IV SOLN
600.0000 mg/m2 | Freq: Once | INTRAVENOUS | Status: AC
  Administered 2017-05-19: 1120 mg via INTRAVENOUS
  Filled 2017-05-19: qty 56

## 2017-05-19 MED ORDER — LIDOCAINE-PRILOCAINE 2.5-2.5 % EX CREA: TOPICAL_CREAM | CUTANEOUS | 3 refills | Status: DC

## 2017-05-19 MED ORDER — DEXAMETHASONE 4 MG PO TABS: ORAL_TABLET | ORAL | 1 refills | Status: DC

## 2017-05-19 NOTE — Telephone Encounter (Signed)
Will mail patient a copy of schedule additions. Per 10/1 los

## 2017-05-19 NOTE — Progress Notes (Signed)
[place]  [phone]) [phone] Fax:(336) [phone]     ID: Mckenzie Webb DOB: March 14, 1946  MR#: [id number]  EHO#:[id number]  Patient Care Team: Rita Ohara, MD as PCP - General (Family Medicine) Jovita Kussmaul, MD as Consulting Physician (General Surgery) Magrinat, Virgie Dad, MD as Consulting Physician (Oncology) Eppie Gibson, MD as Attending Physician (Radiation Oncology) Juanita Craver, MD as Consulting Physician (Gastroenterology) Vania Rea, MD as Consulting Physician (Obstetrics and Gynecology) Elsie Saas, MD as Consulting Physician (Orthopedic Surgery) Martinique, Amy, MD as Consulting Physician (Dermatology) Scot Dock, NP OTHER MD:  CHIEF COMPLAINT: Estrogen receptor positive breast cancer  CURRENT TREATMENT: Adjuvant chemotherapy   BREAST CANCER HISTORY: From the original intake note:  "Mckenzie Webb" had bilateral screening mammography with tomography at [facility]/21/2017. This showed a possible mass in the right breast. Right diagnostic mammography with tomography and ultrasonography 02/27/2017 found a breast density to be category B. In the subareolar right breast there was a 0.5 cm spiculated mass, which was not palpable. There was mild right nipple inversion, which per report was chronic. Ultrasonography confirmed a 0.5 cm retroareolar breast mass at the 1:00 radiant. The right axilla was sonographically benign.  On 03/03/2017 the patient underwent biopsy of the right breast mass in question. This showed ([phone]) and invasive ductal carcinoma, grade 1, estrogen receptor 100% positive, progesterone receptor negative, with an MIB-1 of 3%, and no HER-2 implication, the signals ratio being 1.25 and the number per cell 2.00.  The patient's subsequent history is as detailed below.  INTERVAL HISTORY: Mckenzie Webb returns today for follow up and treatment of her estrogen receptor positive breast cancer.  She is here to receive cycle 1 of 3 of  Doxorubicin and Cyclophosphamide given on day 1 of a 14 day cycle with Neulasta support.    REVIEW OF SYSTEMS: Mckenzie Webb is doing well today.  She has some mild soreness in her axilla, but otherwise is doing well without any questions or concerns.  She has all of her anti nausea medications and knows how to take them.  A detailed ROS is otherwise non contributory.    PAST MEDICAL HISTORY: Past Medical History:  Diagnosis Date  . Allergy   . DJD (degenerative joint disease), cervical 04 2002  . Edema    resolved  . Family history of colon cancer   . Family history of pancreatic cancer   . GERD (gastroesophageal reflux disease)   . Hyperlipidemia   . Hypothyroid   . Interstitial cystitis   . Leukopenia   . PONV (postoperative nausea and vomiting)   . Psoriasis     PAST SURGICAL HISTORY: Past Surgical History:  Procedure Laterality Date  . BREAST LUMPECTOMY WITH RADIOACTIVE SEED AND SENTINEL LYMPH NODE BIOPSY Right 03/26/2017   Procedure: BREAST LUMPECTOMY WITH RADIOACTIVE SEED AND SENTINEL LYMPH NODE BIOPSY;  Surgeon: Jovita Kussmaul, MD;  Location: [facility];  Service: General;  Laterality: Right;  . CHOLECYSTECTOMY    . COLONOSCOPY  3/09  . KNEE ARTHROSCOPY Right 1990  . KNEE ARTHROSCOPY Left 12/12/2010    (Dr. Noemi Chapel)  . PORTACATH PLACEMENT Left 04/24/2017   Procedure: INSERTION PORT-A-CATH;  Surgeon: Jovita Kussmaul, MD;  Location: [facility];  Service: General;  Laterality: Left;  . TUBAL LIGATION  1983  . VARICOSE VEIN SURGERY  09/2009 R, 06/2010 L    FAMILY HISTORY Family History  Problem Relation Age of Onset  . Hypertension Mother   . Heart disease Mother   .  Gallbladder disease Mother   . Thyroid disease Mother   . Arthritis Mother   . Diabetes Father   . Cancer Father        pancreatic  . Hypertension Sister   . Gallbladder disease Sister   . Diabetes Sister   . Diverticulitis Sister        of small intestine  . Cancer Son        testicular cancer  .  Stroke Sister        late 55's  . Hypertension Sister   . Pulmonary embolism Sister   . Ulcerative colitis Sister   . COPD Brother        had lung lobe removed for suspected cancer, but was benign; smoker  . Colon cancer Other 57  . Lung cancer Cousin        maternal first cousin - smoker  . Breast cancer Neg Hx   The patient's father died from pancreatic cancer at the age of 99. The patient's mother died from a myocardial infarction at age 52. The patient had one brother, 3 sisters. The patient's older son was diagnosed with testicular cancer at age 72. A maternal niece was diagnosed with colon cancer at age 10 and a maternal cousin with lung cancer at an unknown age.  GYNECOLOGIC HISTORY:  No LMP recorded. Patient is postmenopausal.  Menarche age 21, first live birth age 86, the patient is [place] P2. she went through the change of life approximately the year 2000. She did not take hormone replacement. She did take oral contraceptives for more than 20 years remotely, with no complications.  SOCIAL HISTORY:  She is a retired Web designer. Her husband Arnette Norris is an Clinical biochemist, still working part-time. Son Mckenzie Webb (from the patient's first marriage) lives in [place] and works in Therapist, art. Son Mckenzie Webb (from patient second marriage) lives in [place] we are is Comptroller. The patient has one grandchild. She is not a Ambulance person.   ADVANCED DIRECTIVES: Not in place   HEALTH MAINTENANCE: Social History  Substance Use Topics  . Smoking status: Never Smoker  . Smokeless tobacco: Never Used  . Alcohol use No     Comment: social, 1-2 times per year     Colonoscopy:2009/[facility] and  PAP:  [facility], T score -0.1 (normal)   Allergies  Allergen Reactions  . Iodine Swelling  . Sulfa Antibiotics Swelling    Lips swell, itching  . Penicillins Rash    Has patient had a PCN reaction causing immediate rash, facial/tongue/throat swelling, SOB  or lightheadedness with hypotension: No Has patient had a PCN reaction causing severe rash involving mucus membranes or skin necrosis: No Has patient had a PCN reaction that required hospitalization: No Has patient had a PCN reaction occurring within the last 10 years: No If all of the above answers are "NO", then may proceed with Cephalosporin use.   Newell Coral [Bacitracin-Polymyxin B] Swelling    Used for eye infection--got redder and worse  . Ciprofloxacin     After she takes for a while it gives her a "funny feeling."  . Codeine Nausea Only    Current Outpatient Prescriptions  Medication Sig Dispense Refill  . anastrozole (ARIMIDEX) 1 MG tablet Take 1 tablet (1 mg total) by mouth daily. 90 tablet 4  . Calcium Carbonate-Vitamin D (CALCIUM 600+D) 600-400 MG-UNIT tablet Take 1 tablet by mouth daily.    . Cholecalciferol (VITAMIN D) 1000 UNITS capsule Take 1,000 Units  by mouth daily.      . Coenzyme Q10 (COQ10) 100 MG CAPS Take 100 mg by mouth daily.    . diclofenac (VOLTAREN) 75 MG EC tablet Take 75 mg by mouth 2 (two) times daily.    Marland Kitchen levothyroxine (SYNTHROID, LEVOTHROID) 88 MCG tablet Take 1 tablet (88 mcg total) by mouth daily. (Patient taking differently: Take 88 mcg by mouth daily before breakfast. 30 minutes before eating anything) 90 tablet 3  . loratadine (CLARITIN) 10 MG tablet Take 10 mg by mouth daily as needed for allergies.     . Multiple Vitamins-Minerals (ALIVE WOMENS 50+ PO) Take 1 tablet by mouth daily.     . Omega-3 Fatty Acids (FISH OIL) 1200 MG CAPS Take 2,400 mg by mouth at bedtime.    Marland Kitchen omeprazole (PRILOSEC) 40 MG capsule TAKE ONE (1) CAPSULE EACH DAY 90 capsule 2  . Red Yeast Rice Extract (RED YEAST RICE PO) Take 1,200 mg by mouth 2 (two) times daily.     Marland Kitchen venlafaxine XR (EFFEXOR-XR) 75 MG 24 hr capsule Take 1 capsule (75 mg total) by mouth daily with breakfast. 30 capsule 0  . vitamin B-12 (CYANOCOBALAMIN) 1000 MCG tablet Take 1,000 mcg by mouth daily.     No  current facility-administered medications for this visit.     OBJECTIVE:  Vitals:   05/19/17 1134  BP: (!) 163/74  Pulse: 75  Resp: 18  Temp: 98.1 F (36.7 C)  SpO2: 100%     Body mass index is 29.54 kg/m.   Wt Readings from Last 3 Encounters:  05/19/17 172 lb 1.6 oz (78.1 kg)  05/05/17 171 lb 3.2 oz (77.7 kg)  04/24/17 171 lb (77.6 kg)  ECOG FS:0 - Asymptomatic GENERAL: Patient is a well appearing female who is tearful today throughout the visit HEENT:  Sclerae anicteric.  PERRL. Oropharynx clear and moist. No ulcerations or evidence of oropharyngeal candidiasis. Neck is supple.  NODES:  No cervical, supraclavicular, or axillary lymphadenopathy palpated.  BREAST EXAM:  Deferred. LUNGS:  Clear to auscultation bilaterally.  No wheezes or rhonchi. HEART:  Regular rate and rhythm. No murmur appreciated. ABDOMEN:  Soft, nontender.  Positive, normoactive bowel sounds. No organomegaly palpated. MSK:  No focal spinal tenderness to palpation. Full range of motion bilaterally in the upper extremities. EXTREMITIES:  No peripheral edema.   SKIN:  Clear with no obvious rashes or skin changes. No nail dyscrasia. NEURO:  Nonfocal. Well oriented.  Appropriate affect.  LAB RESULTS:  CMP     Component Value Date/Time   NA 136 05/05/2017 1245   K 3.4 (L) 05/05/2017 1245   CL 100 (L) 05/02/2017 2301   CO2 27 05/05/2017 1245   GLUCOSE 85 05/05/2017 1245   BUN 5.6 (L) 05/05/2017 1245   CREATININE 0.7 05/05/2017 1245   CALCIUM 9.5 05/05/2017 1245   PROT 6.5 05/05/2017 1245   ALBUMIN 3.2 (L) 05/05/2017 1245   AST 26 05/05/2017 1245   ALT 47 05/05/2017 1245   ALKPHOS 73 05/05/2017 1245   BILITOT 0.68 05/05/2017 1245   GFRNONAA >60 05/02/2017 2301   GFRAA >60 05/02/2017 2301    No results found for: TOTALPROTELP, ALBUMINELP, A1GS, A2GS, BETS, BETA2SER, GAMS, MSPIKE, SPEI  No results found for: KPAFRELGTCHN, LAMBDASER, KAPLAMBRATIO  Lab Results  Component Value Date   WBC 14.6 (H)  05/19/2017   NEUTROABS 13.1 (H) 05/19/2017   HGB 12.2 05/19/2017   HCT 36.3 05/19/2017   MCV 94.8 05/19/2017   PLT 279 05/19/2017  Chemistry      Component Value Date/Time   NA 136 05/05/2017 1245   K 3.4 (L) 05/05/2017 1245   CL 100 (L) 05/02/2017 2301   CO2 27 05/05/2017 1245   BUN 5.6 (L) 05/05/2017 1245   CREATININE 0.7 05/05/2017 1245      Component Value Date/Time   CALCIUM 9.5 05/05/2017 1245   ALKPHOS 73 05/05/2017 1245   AST 26 05/05/2017 1245   ALT 47 05/05/2017 1245   BILITOT 0.68 05/05/2017 1245       No results found for: LABCA2  No components found for: [id number]  No results for input(s): INR in the last 168 hours.  Urinalysis    Component Value Date/Time   COLORURINE YELLOW 05/02/2017 2201   APPEARANCEUR HAZY (A) 05/02/2017 2201   LABSPEC 1.014 05/02/2017 2201   PHURINE 6.0 05/02/2017 2201   GLUCOSEU NEGATIVE 05/02/2017 2201   HGBUR SMALL (A) 05/02/2017 2201   BILIRUBINUR NEGATIVE 05/02/2017 2201   BILIRUBINUR neg 08/17/2014 1123   KETONESUR NEGATIVE 05/02/2017 2201   PROTEINUR NEGATIVE 05/02/2017 2201   UROBILINOGEN negative 08/17/2014 1123   NITRITE POSITIVE (A) 05/02/2017 2201   LEUKOCYTESUR LARGE (A) 05/02/2017 2201     STUDIES: Dg Chest 2 View  Result Date: 05/02/2017 CLINICAL DATA:  Initial evaluation for acute weakness, cough. EXAM: CHEST  2 VIEW COMPARISON:  Prior radiograph from 04/24/2017. FINDINGS: Left-sided Port-A-Cath. Cardiac and mediastinal silhouettes are stable, and remain within normal limits. Lungs mildly hypoinflated. Mild atelectatic changes at the left lung base. No focal infiltrates. No pulmonary edema or pleural effusion. No pneumothorax. No acute osseus abnormality. IMPRESSION: 1. Mild left basilar atelectasis. 2. No other active cardiopulmonary disease. Electronically Signed   By: Jeannine Boga M.D.   On: 05/02/2017 23:10   Ct Head Wo Contrast  Result Date: 05/03/2017 CLINICAL DATA:  Acute headache.  Normal neurologic exam. Recently diagnosed with breast cancer. EXAM: CT HEAD WITHOUT CONTRAST TECHNIQUE: Contiguous axial images were obtained from the base of the skull through the vertex without intravenous contrast. COMPARISON:  None. FINDINGS: Brain: No intracranial hemorrhage, mass effect, or midline shift. No evidence of intracranial mass on noncontrast CT. No hydrocephalus. The basilar cisterns are patent. No evidence of territorial infarct or acute ischemia. No extra-axial or intracranial fluid collection. Vascular: No hyperdense vessel. Skull: No focal lesion.  No skull fracture. Sinuses/Orbits: Paranasal sinuses and mastoid air cells are clear. The visualized orbits are unremarkable. Other: None. IMPRESSION: 1.  No acute intracranial abnormality. 2. No evidence of intracranial metastatic disease or mass on noncontrast head CT. If there is persistent clinical concern, recommend brain MRI, preferably with contrast. Electronically Signed   By: Jeb Levering M.D.   On: 05/03/2017 02:31   Dg Chest Port 1 View  Result Date: 04/24/2017 CLINICAL DATA:  Port-A-Cath placement.  Breast cancer. EXAM: PORTABLE CHEST 1 VIEW COMPARISON:  One-view chest x-ray a scratched at two-view chest x-ray a 01/28/2017 FINDINGS: Heart size is exaggerated by low lung volumes. A left subclavian Port-A-Cath is in place. The tip is just above the cavoatrial junction. Left basilar airspace disease and pleural effusion are noted. Chronic splenic calcification is again noted. Surgical clips are present in the right breast or axilla. IMPRESSION: 1. Interval placement of left subclavian Port-A-Cath without radiographic evidence for complication. 2. Low lung volumes with left pleural effusion and basilar airspace disease. Infection or aspiration is not excluded. Electronically Signed   By: San Morelle M.D.   On: 04/24/2017 08:55   Dg  C-arm 1-60 Min-no Report  Result Date: 04/24/2017 Fluoroscopy was utilized by the requesting  physician.  No radiographic interpretation.    ELIGIBLE FOR AVAILABLE RESEARCH PROTOCOL: no  ASSESSMENT: 71 y.o. [place] woman status post right breast upper inner quadrant biopsy 03/04/2015 for a clinical T1a N0, stage IA invasive ductal carcinoma, grade 1, estrogen receptor positive, progesterone receptor negative, with an MIB-1 of 3% and no HER-2 amplification  (1) anastrozole started neoadjuvantly 03/12/2017 in anticipation of possible surgical delays   (2) genetics testingNegative genetic testing on the common hereditary cancer panel.  The Hereditary Gene Panel offered by [facility] includes sequencing and/or deletion duplication testing of the following 46 genes: APC, ATM, AXIN2, BARD1, BMPR1A, BRCA1, BRCA2, BRIP1, CDH1, CDKN2A (p14ARF), CDKN2A (p16INK4a), CHEK2, CTNNA1, DICER1, EPCAM (Deletion/duplication testing only), GREM1 (promoter region deletion/duplication testing only), KIT, MEN1, MLH1, MSH2, MSH3, MSH6, MUTYH, NBN, NF1, NHTL1, PALB2, PDGFRA, PMS2, POLD1, POLE, PTEN, RAD50, RAD51C, RAD51D, SDHB, SDHC, SDHD, SMAD4, SMARCA4. STK11, TP53, TSC1, TSC2, and VHL.  The following genes were evaluated for sequence changes only: SDHA and HOXB13 c.251G>A variant only.  The report date is April 12, 2017.  (3) right lumpectomy and sentinel lymph node biopsy 03/26/2017 showed a pT1c pN1, stage IB invasive ductal carcinoma, grade 1, with negative margins  (4) Mammaprint returned high risk, indicating need for adjuvant chemotherapy  (5) chemotherapy will consist of Cytoxan and docetaxel given every 3 weeks 4 beginning on 04/28/2017, to change to Doxorubicin and Cyclophosphamide beginning 05/19/2017  (6) adjuvant radiation to follow   (7) continue anti-estrogens to a total of 5 years.  PLAN: Mckenzie Webb is doing well today.  I reviewed her labs with her which are stable.  She will proceed with her first cycle of Doxorubicin and Cyclophosphamide today.  She and I reviewed possible side effects and she  verbalized understanding.  She will receive Neulasta in the form of Onpro.  I made the appropriate changes in the treatment plan, and called both her nurse, and pharmacy.    Mckenzie Webb will return in one week for labs and follow up.      She knows to call us prior to her next appointment.    A total of (30) minutes of face-to-face time was spent with this patient with greater than 50% of that time in counseling and care-coordination.   Scot Dock, NP   05/19/2017 11:39 AM Medical Oncology and Hematology [facility] [address] [place], [place] [address] Tel. [phone]    Fax. [phone]

## 2017-05-19 NOTE — Patient Instructions (Addendum)
[place] Discharge Instructions for Patients Receiving Chemotherapy  Today you received the following chemotherapy agents ADRIAMYCIN,CYTOXAN To help prevent nausea and vomiting after your treatment, we encourage you to take your nausea medication as prescribed.   If you develop nausea and vomiting that is not controlled by your nausea medication, call the clinic.   BELOW ARE SYMPTOMS THAT SHOULD BE REPORTED IMMEDIATELY:  *FEVER GREATER THAN 100.5 F  *CHILLS WITH OR WITHOUT FEVER  NAUSEA AND VOMITING THAT IS NOT CONTROLLED WITH YOUR NAUSEA MEDICATION  *UNUSUAL SHORTNESS OF BREATH  *UNUSUAL BRUISING OR BLEEDING  TENDERNESS IN MOUTH AND THROAT WITH OR WITHOUT PRESENCE OF ULCERS  *URINARY PROBLEMS  *BOWEL PROBLEMS  UNUSUAL RASH Items with * indicate a potential emergency and should be followed up as soon as possible.  Feel free to call the clinic should you have any questions or concerns. The clinic phone number is [phone].  Please show the [place] at check-in to the Emergency Department and triage nurse.

## 2017-05-23 ENCOUNTER — Telehealth: Payer: Self-pay | Admitting: *Deleted

## 2017-05-23 NOTE — Telephone Encounter (Signed)
This RN canceled infusion appointment on 10/8 due to change in treatment from CTX/docetaxel to CTX/doxirubcin - with most recent treatment on 05/19/2017 with neulasta support.  No further changes in appointments per review.

## 2017-05-25 NOTE — Progress Notes (Addendum)
[place]  [phone] [phone]     ID: [name] DOB: [birth year]  MR#: [id number]  IWL#:[id number]  Patient Care Team: Rita Ohara, MD as PCP - General (Family Medicine) Jovita Kussmaul, MD as Consulting Physician (General Surgery) Magrinat, Virgie Dad, MD as Consulting Physician (Oncology) Eppie Gibson, MD as Attending Physician (Radiation Oncology) Juanita Craver, MD as Consulting Physician (Gastroenterology) Vania Rea, MD as Consulting Physician (Obstetrics and Gynecology) Elsie Saas, MD as Consulting Physician (Orthopedic Surgery) Martinique, Amy, MD as Consulting Physician (Dermatology) Scot Dock, NP OTHER MD:  CHIEF COMPLAINT: Estrogen receptor positive breast cancer  CURRENT TREATMENT: Adjuvant chemotherapy   BREAST CANCER HISTORY: From the original intake note:  "Mckenzie Webb" had bilateral screening mammography with tomography at the [facility] 02/21/2017. This showed a possible mass in the right breast. Right diagnostic mammography with tomography and ultrasonography 02/27/2017 found a breast density to be category B. In the subareolar right breast there was a 0.5 cm spiculated mass, which was not palpable. There was mild right nipple inversion, which per report was chronic. Ultrasonography confirmed a 0.5 cm retroareolar breast mass at the 1:00 radiant. The right axilla was sonographically benign.  On 03/03/2017 the patient underwent biopsy of the right breast mass in question. This showed ([id number]) and invasive ductal carcinoma, grade 1, estrogen receptor 100% positive, progesterone receptor negative, with an MIB-1 of 3%, and no HER-2 implication, the signals ratio being 1.25 and the number per cell 2.00.  The patient's subsequent history is as detailed below.  INTERVAL HISTORY: Mckenzie Webb returns today for follow up and treatment of her estrogen receptor positive breast cancer.  She is here for evaluation cycle 1 day 8 of  3 of Doxorubicin and Cyclophosphamide given on day 1 of a 14 day cycle with Neulasta support.    REVIEW OF SYSTEMS:  Mckenzie Webb is not feeling well today.  She is weak.  She has had a decreased BP at home, and developed redness and drainage at her lumpectomy site.  She is neutropenic today.  She denies any fevers.  She has had a couple of loose bowel movements that resolved with Imodium.  She has a cough and some mild nasal drainage.  Otherwise, a detailed ROS is non contributory.    PAST MEDICAL HISTORY: Past Medical History:  Diagnosis Date  . Allergy   . DJD (degenerative joint disease), cervical 04 2002  . Edema    resolved  . Family history of colon cancer   . Family history of pancreatic cancer   . GERD (gastroesophageal reflux disease)   . Hyperlipidemia   . Hypothyroid   . Interstitial cystitis   . Leukopenia   . PONV (postoperative nausea and vomiting)   . Psoriasis     PAST SURGICAL HISTORY: Past Surgical History:  Procedure Laterality Date  . BREAST LUMPECTOMY WITH RADIOACTIVE SEED AND SENTINEL LYMPH NODE BIOPSY Right 03/26/2017   Procedure: BREAST LUMPECTOMY WITH RADIOACTIVE SEED AND SENTINEL LYMPH NODE BIOPSY;  Surgeon: Jovita Kussmaul, MD;  Location: [facility];  Service: General;  Laterality: Right;  . CHOLECYSTECTOMY    . COLONOSCOPY  3/09  . KNEE ARTHROSCOPY Right 1990  . KNEE ARTHROSCOPY Left 12/12/2010    (Dr. Noemi Chapel)  . PORTACATH PLACEMENT Left 04/24/2017   Procedure: INSERTION PORT-A-CATH;  Surgeon: Jovita Kussmaul, MD;  Location: [facility];  Service: General;  Laterality: Left;  . TUBAL LIGATION  1983  . VARICOSE VEIN SURGERY  09/2009 R, 06/2010  L    FAMILY HISTORY Family History  Problem Relation Age of Onset  . Hypertension Mother   . Heart disease Mother   . Gallbladder disease Mother   . Thyroid disease Mother   . Arthritis Mother   . Diabetes Father   . Cancer Father        pancreatic  . Hypertension Sister   . Gallbladder disease Sister     . Diabetes Sister   . Diverticulitis Sister        of small intestine  . Cancer Son        testicular cancer  . Stroke Sister        late 34's  . Hypertension Sister   . Pulmonary embolism Sister   . Ulcerative colitis Sister   . COPD Brother        had lung lobe removed for suspected cancer, but was benign; smoker  . Colon cancer Other 95  . Lung cancer Cousin        maternal first cousin - smoker  . Breast cancer Neg Hx   The patient's father died from pancreatic cancer at the age of 29. The patient's mother died from a myocardial infarction at age 31. The patient had one brother, 3 sisters. The patient's older son was diagnosed with testicular cancer at age 11. A maternal niece was diagnosed with colon cancer at age 20 and a maternal cousin with lung cancer at an unknown age.  GYNECOLOGIC HISTORY:  No LMP recorded. Patient is postmenopausal.  Menarche age 71, first live birth age 33, the patient is [place] P2. she went through the change of life approximately the year 2000. She did not take hormone replacement. She did take oral contraceptives for more than 20 years remotely, with no complications.  SOCIAL HISTORY:  She is a retired Web designer. Her husband Arnette Norris is an Clinical biochemist, still working part-time. Son Corene Cornea (from the patient's first marriage) lives in [place] and works in Therapist, art. Son Catalina Antigua (from patient second marriage) lives in [place] we are is Comptroller. The patient has one grandchild. She is not a Ambulance person.   ADVANCED DIRECTIVES: Not in place   HEALTH MAINTENANCE: Social History  Substance Use Topics  . Smoking status: Never Smoker  . Smokeless tobacco: Never Used  . Alcohol use No     Comment: social, 1-2 times per year     Colonoscopy:2009/[facility] and  PAP:  [facility] T score -0.1 (normal)   Allergies  Allergen Reactions  . Iodine Swelling  . Sulfa Antibiotics Swelling    Lips swell,  itching  . Penicillins Rash    Has patient had a PCN reaction causing immediate rash, facial/tongue/throat swelling, SOB or lightheadedness with hypotension: No Has patient had a PCN reaction causing severe rash involving mucus membranes or skin necrosis: No Has patient had a PCN reaction that required hospitalization: No Has patient had a PCN reaction occurring within the last 10 years: No If all of the above answers are "NO", then may proceed with Cephalosporin use.   [place] [Bacitracin-Polymyxin B] Swelling    Used for eye infection--got redder and worse  . Ciprofloxacin     After she takes for a while it gives her a "funny feeling."  . Codeine Nausea Only    Current Outpatient Prescriptions  Medication Sig Dispense Refill  . anastrozole (ARIMIDEX) 1 MG tablet Take 1 tablet (1 mg total) by mouth daily. 90 tablet 4  .  Calcium Carbonate-Vitamin D (CALCIUM 600+D) 600-400 MG-UNIT tablet Take 1 tablet by mouth daily.    . Cholecalciferol (VITAMIN D) 1000 UNITS capsule Take 1,000 Units by mouth daily.      . Coenzyme Q10 (COQ10) 100 MG CAPS Take 100 mg by mouth daily.    Marland Kitchen dexamethasone (DECADRON) 4 MG tablet TAKE 2 TABLETS BY MOUTH TWICE A DAY START THE DAY BEFORE TAXOTERE THEN THE DAY AFTER CHEMO X 3 DAYS  1  . dexamethasone (DECADRON) 4 MG tablet Take 2 tablets by mouth once a day on the day after chemotherapy and then take 2 tablets two times a day for 2 days. Take with food. 30 tablet 1  . diclofenac (VOLTAREN) 75 MG EC tablet Take 75 mg by mouth 2 (two) times daily.    Marland Kitchen levothyroxine (SYNTHROID, LEVOTHROID) 88 MCG tablet Take 1 tablet (88 mcg total) by mouth daily. (Patient taking differently: Take 88 mcg by mouth daily before breakfast. 30 minutes before eating anything) 90 tablet 3  . lidocaine-prilocaine (EMLA) cream APPLY TO AFFECTED AREA ONCE  3  . lidocaine-prilocaine (EMLA) cream Apply to affected area once 30 g 3  . loratadine (CLARITIN) 10 MG tablet Take 10 mg by mouth  daily as needed for allergies.     Marland Kitchen LORazepam (ATIVAN) 0.5 MG tablet Take 0.5 mg by mouth at bedtime as needed.  0  . LORazepam (ATIVAN) 0.5 MG tablet Take 1 tablet (0.5 mg total) by mouth every 6 (six) hours as needed (Nausea or vomiting). 30 tablet 0  . Multiple Vitamins-Minerals (ALIVE WOMENS 50+ PO) Take 1 tablet by mouth daily.     . Omega-3 Fatty Acids (FISH OIL) 1200 MG CAPS Take 2,400 mg by mouth at bedtime.    Marland Kitchen omeprazole (PRILOSEC) 40 MG capsule TAKE ONE (1) CAPSULE EACH DAY 90 capsule 2  . ondansetron (ZOFRAN) 8 MG tablet Take 1 tablet (8 mg total) by mouth 2 (two) times daily as needed. Start on the third day after chemotherapy. 30 tablet 1  . prochlorperazine (COMPAZINE) 10 MG tablet Take 1 tablet (10 mg total) by mouth every 6 (six) hours as needed (Nausea or vomiting). 30 tablet 1  . Red Yeast Rice Extract (RED YEAST RICE PO) Take 1,200 mg by mouth 2 (two) times daily.     Marland Kitchen venlafaxine XR (EFFEXOR-XR) 75 MG 24 hr capsule Take 1 capsule (75 mg total) by mouth daily with breakfast. 30 capsule 0  . vitamin B-12 (CYANOCOBALAMIN) 1000 MCG tablet Take 1,000 mcg by mouth daily.     No current facility-administered medications for this visit.     OBJECTIVE:  There were no vitals filed for this visit.   There is no height or weight on file to calculate BMI.   Wt Readings from Last 3 Encounters:  05/19/17 172 lb 1.6 oz (78.1 kg)  05/05/17 171 lb 3.2 oz (77.7 kg)  04/24/17 171 lb (77.6 kg)  ECOG FS:0 - Asymptomatic GENERAL: Patient is a tired appearing female in no acute distress HEENT:  Sclerae anicteric.  PERRL. Oropharynx clear and moist. No ulcerations or evidence of oropharyngeal candidiasis. Neck is supple.  NODES:  No cervical, supraclavicular, or axillary lymphadenopathy palpated.  BREAST EXAM:  Lumpectomy site with erythema surrounding it, warmth, and tenderness, mild amount of non purulent drainage present LUNGS:  Clear to auscultation bilaterally.  No wheezes or  rhonchi. HEART:  Regular rate and rhythm. No murmur appreciated. ABDOMEN:  Soft, nontender.  Positive, normoactive bowel sounds. No organomegaly  palpated. MSK:  No focal spinal tenderness to palpation. Full range of motion bilaterally in the upper extremities. EXTREMITIES:  No peripheral edema.   SKIN:  Clear with no obvious rashes or skin changes. No nail dyscrasia. NEURO:  Nonfocal. Well oriented.  Appropriate affect.  LAB RESULTS:  CMP     Component Value Date/Time   NA 140 05/19/2017 1059   K 3.8 05/19/2017 1059   CL 100 (L) 05/02/2017 2301   CO2 21 (L) 05/19/2017 1059   GLUCOSE 163 (H) 05/19/2017 1059   BUN 13.7 05/19/2017 1059   CREATININE 0.8 05/19/2017 1059   CALCIUM 9.3 05/19/2017 1059   PROT 7.1 05/19/2017 1059   ALBUMIN 3.6 05/19/2017 1059   AST 16 05/19/2017 1059   ALT 20 05/19/2017 1059   ALKPHOS 64 05/19/2017 1059   BILITOT 0.35 05/19/2017 1059   GFRNONAA >60 05/02/2017 2301   GFRAA >60 05/02/2017 2301    No results found for: TOTALPROTELP, ALBUMINELP, A1GS, A2GS, BETS, BETA2SER, GAMS, MSPIKE, SPEI  No results found for: KPAFRELGTCHN, LAMBDASER, [facility]  Lab Results  Component Value Date   WBC 14.6 (H) 05/19/2017   NEUTROABS 13.1 (H) 05/19/2017   HGB 12.2 05/19/2017   HCT 36.3 05/19/2017   MCV 94.8 05/19/2017   PLT 279 05/19/2017      Chemistry      Component Value Date/Time   NA 140 05/19/2017 1059   K 3.8 05/19/2017 1059   CL 100 (L) 05/02/2017 2301   CO2 21 (L) 05/19/2017 1059   BUN 13.7 05/19/2017 1059   CREATININE 0.8 05/19/2017 1059      Component Value Date/Time   CALCIUM 9.3 05/19/2017 1059   ALKPHOS 64 05/19/2017 1059   AST 16 05/19/2017 1059   ALT 20 05/19/2017 1059   BILITOT 0.35 05/19/2017 1059       No results found for: LABCA2  No components found for: FTDDUK025  No results for input(s): INR in the last 168 hours.  Urinalysis    Component Value Date/Time   COLORURINE YELLOW 05/02/2017 2201   APPEARANCEUR HAZY  (A) 05/02/2017 2201   LABSPEC 1.014 05/02/2017 2201   PHURINE 6.0 05/02/2017 2201   GLUCOSEU NEGATIVE 05/02/2017 2201   HGBUR SMALL (A) 05/02/2017 2201   BILIRUBINUR NEGATIVE 05/02/2017 2201   BILIRUBINUR neg 08/17/2014 1123   KETONESUR NEGATIVE 05/02/2017 2201   PROTEINUR NEGATIVE 05/02/2017 2201   UROBILINOGEN negative 08/17/2014 1123   NITRITE POSITIVE (A) 05/02/2017 2201   LEUKOCYTESUR LARGE (A) 05/02/2017 2201     STUDIES: Dg Chest 2 View  Result Date: 05/02/2017 CLINICAL DATA:  Initial evaluation for acute weakness, cough. EXAM: CHEST  2 VIEW COMPARISON:  Prior radiograph from 04/24/2017. FINDINGS: Left-sided Port-A-Cath. Cardiac and mediastinal silhouettes are stable, and remain within normal limits. Lungs mildly hypoinflated. Mild atelectatic changes at the left lung base. No focal infiltrates. No pulmonary edema or pleural effusion. No pneumothorax. No acute osseus abnormality. IMPRESSION: 1. Mild left basilar atelectasis. 2. No other active cardiopulmonary disease. Electronically Signed   By: Jeannine Boga M.D.   On: 05/02/2017 23:10   Ct Head Wo Contrast  Result Date: 05/03/2017 CLINICAL DATA:  Acute headache. Normal neurologic exam. Recently diagnosed with breast cancer. EXAM: CT HEAD WITHOUT CONTRAST TECHNIQUE: Contiguous axial images were obtained from the base of the skull through the vertex without intravenous contrast. COMPARISON:  None. FINDINGS: Brain: No intracranial hemorrhage, mass effect, or midline shift. No evidence of intracranial mass on noncontrast CT. No hydrocephalus. The basilar cisterns  are patent. No evidence of territorial infarct or acute ischemia. No extra-axial or intracranial fluid collection. Vascular: No hyperdense vessel. Skull: No focal lesion.  No skull fracture. Sinuses/Orbits: Paranasal sinuses and mastoid air cells are clear. The visualized orbits are unremarkable. Other: None. IMPRESSION: 1.  No acute intracranial abnormality. 2. No evidence  of intracranial metastatic disease or mass on noncontrast head CT. If there is persistent clinical concern, recommend brain MRI, preferably with contrast. Electronically Signed   By: Jeb Levering M.D.   On: 05/03/2017 02:31    ELIGIBLE FOR AVAILABLE RESEARCH PROTOCOL: no  ASSESSMENT: 71 y.o. [place] woman status post right breast upper inner quadrant biopsy 03/04/2015 for a clinical T1a N0, stage IA invasive ductal carcinoma, grade 1, estrogen receptor positive, progesterone receptor negative, with an MIB-1 of 3% and no HER-2 amplification  (1) anastrozole started neoadjuvantly 03/12/2017 in anticipation of possible surgical delays   (2) genetics testingNegative genetic testing on the common hereditary cancer panel.  The Hereditary Gene Panel offered by [facility] includes sequencing and/or deletion duplication testing of the following 46 genes: APC, ATM, AXIN2, BARD1, BMPR1A, BRCA1, BRCA2, BRIP1, CDH1, CDKN2A (p14ARF), CDKN2A (p16INK4a), CHEK2, CTNNA1, DICER1, EPCAM (Deletion/duplication testing only), GREM1 (promoter region deletion/duplication testing only), KIT, MEN1, MLH1, MSH2, MSH3, MSH6, MUTYH, NBN, NF1, NHTL1, PALB2, PDGFRA, PMS2, POLD1, POLE, PTEN, RAD50, RAD51C, RAD51D, SDHB, SDHC, SDHD, SMAD4, SMARCA4. STK11, TP53, TSC1, TSC2, and VHL.  The following genes were evaluated for sequence changes only: SDHA and HOXB13 c.251G>A variant only.  The report date is April 12, 2017.  (3) right lumpectomy and sentinel lymph node biopsy 03/26/2017 showed a pT1c pN1, stage IB invasive ductal carcinoma, grade 1, with negative margins  (4) Mammaprint returned high risk, indicating need for adjuvant chemotherapy  (5) chemotherapy will consist of Cytoxan and docetaxel given every 3 weeks 4 beginning on 04/28/2017  (a) changed to Doxorubicin and Cyclophosphamide beginning 05/19/2017, to complete 3 cycles  (b) dose reduced by 20% with the second and third CA treatments  (6) adjuvant radiation to  follow   (7) continue anti-estrogens to a total of 5 years.  PLAN: Mckenzie Webb is doing "ok" following chemotherapy.  She has what appears to be a cellulitis at her lumpectomy site.  She will receive 3 days of Ceftriaxone IV and IV fluids today and for the next three days.  She will also take PO Doxycycline 133m BID.  We reviewed reasons to call and/or go to the emergency room.  She will return on Wednesday for f/u with me to see how her breast is doing.  Dr. MJana Hakimsaw GEdd Fabiantoday as well and helped to formulate the above plan.   She knows to call for any questions or concerns prior to her next appointment with u[place]     LScot Dock NP   05/25/2017 6:40 PM Medical Oncology and Hematology [facility] [place] [address]Tel. [phone]   Fax. [phone]  ADDENDUM: PJoyellsituation is difficult. I really do not believe we're going to be able to get her through the chemotherapy unless we drop the dose and provide her with more support.  Right now I am concerned that if we do not provide intravenous as well as oral antibiotics she may end up in the hospital with febrile neutropenia. Accordingly we are going with ceftriaxone daily 3. She will also receive doxycycline for 7 days. Hopefully this will take care of the current problem.  As stated I'm dropping  the dose of her chemotherapy agents for the remaining cycles by 20%.  I personally saw this patient and performed a substantive portion of this encounter with the listed APP documented above.   Chauncey Cruel, MD Medical Oncology and Hematology [facility] [address] [place], [place] [address] Tel. [phone]    Fax. [phone]

## 2017-05-26 ENCOUNTER — Ambulatory Visit (HOSPITAL_BASED_OUTPATIENT_CLINIC_OR_DEPARTMENT_OTHER): Payer: Medicare Other

## 2017-05-26 ENCOUNTER — Other Ambulatory Visit (HOSPITAL_BASED_OUTPATIENT_CLINIC_OR_DEPARTMENT_OTHER): Payer: Medicare Other

## 2017-05-26 ENCOUNTER — Ambulatory Visit (HOSPITAL_BASED_OUTPATIENT_CLINIC_OR_DEPARTMENT_OTHER): Payer: Medicare Other | Admitting: Adult Health

## 2017-05-26 ENCOUNTER — Ambulatory Visit: Payer: Medicare Other

## 2017-05-26 VITALS — BP 164/68 | HR 71 | Temp 98.2°F | Resp 18 | Ht 64.0 in | Wt 171.7 lb

## 2017-05-26 DIAGNOSIS — C50211 Malignant neoplasm of upper-inner quadrant of right female breast: Secondary | ICD-10-CM | POA: Diagnosis not present

## 2017-05-26 DIAGNOSIS — D701 Agranulocytosis secondary to cancer chemotherapy: Secondary | ICD-10-CM | POA: Diagnosis not present

## 2017-05-26 DIAGNOSIS — C50411 Malignant neoplasm of upper-outer quadrant of right female breast: Secondary | ICD-10-CM

## 2017-05-26 DIAGNOSIS — D702 Other drug-induced agranulocytosis: Secondary | ICD-10-CM

## 2017-05-26 DIAGNOSIS — N61 Mastitis without abscess: Secondary | ICD-10-CM

## 2017-05-26 DIAGNOSIS — I427 Cardiomyopathy due to drug and external agent: Secondary | ICD-10-CM

## 2017-05-26 DIAGNOSIS — Z170 Estrogen receptor positive status [ER+]: Principal | ICD-10-CM

## 2017-05-26 DIAGNOSIS — Z79811 Long term (current) use of aromatase inhibitors: Secondary | ICD-10-CM

## 2017-05-26 DIAGNOSIS — R531 Weakness: Secondary | ICD-10-CM | POA: Diagnosis not present

## 2017-05-26 DIAGNOSIS — Z95828 Presence of other vascular implants and grafts: Secondary | ICD-10-CM

## 2017-05-26 DIAGNOSIS — T451X5A Adverse effect of antineoplastic and immunosuppressive drugs, initial encounter: Secondary | ICD-10-CM

## 2017-05-26 LAB — COMPREHENSIVE METABOLIC PANEL
ALBUMIN: 3.3 g/dL — AB (ref 3.5–5.0)
ALK PHOS: 71 U/L (ref 40–150)
ALT: 44 U/L (ref 0–55)
ANION GAP: 8 meq/L (ref 3–11)
AST: 10 U/L (ref 5–34)
BUN: 11.9 mg/dL (ref 7.0–26.0)
CALCIUM: 9 mg/dL (ref 8.4–10.4)
CO2: 26 mEq/L (ref 22–29)
Chloride: 104 mEq/L (ref 98–109)
Creatinine: 0.7 mg/dL (ref 0.6–1.1)
EGFR: 89 mL/min/{1.73_m2} — AB (ref >90)
Glucose: 131 mg/dl (ref 70–140)
POTASSIUM: 4.1 meq/L (ref 3.5–5.1)
Sodium: 138 mEq/L (ref 136–145)
Total Bilirubin: 0.86 mg/dL (ref 0.20–1.20)
Total Protein: 6.4 g/dL (ref 6.4–8.3)

## 2017-05-26 LAB — CBC WITH DIFFERENTIAL/PLATELET
BASO%: 0 % (ref 0.0–2.0)
BASOS ABS: 0 10*3/uL (ref 0.0–0.1)
EOS%: 7.7 % — AB (ref 0.0–7.0)
Eosinophils Absolute: 0 10*3/uL (ref 0.0–0.5)
HCT: 34.2 % — ABNORMAL LOW (ref 34.8–46.6)
HGB: 11.3 g/dL — ABNORMAL LOW (ref 11.6–15.9)
LYMPH#: 0.3 10*3/uL — AB (ref 0.9–3.3)
LYMPH%: 84.6 % — AB (ref 14.0–49.7)
MCH: 31.3 pg (ref 25.1–34.0)
MCHC: 33 g/dL (ref 31.5–36.0)
MCV: 94.7 fL (ref 79.5–101.0)
MONO#: 0 10*3/uL — ABNORMAL LOW (ref 0.1–0.9)
MONO%: 5.1 % (ref 0.0–14.0)
NEUT#: 0 10*3/uL — CL (ref 1.5–6.5)
NEUT%: 2.6 % — ABNORMAL LOW (ref 38.4–76.8)
PLATELETS: 71 10*3/uL — AB (ref 145–400)
RBC: 3.61 10*6/uL — AB (ref 3.70–5.45)
RDW: 13.6 % (ref 11.2–14.5)
WBC: 0.4 10*3/uL — AB (ref 3.9–10.3)
nRBC: 0 % (ref 0–0)

## 2017-05-26 MED ORDER — SODIUM CHLORIDE 0.9% FLUSH
10.0000 mL | Freq: Once | INTRAVENOUS | Status: AC
  Administered 2017-05-26: 10 mL via INTRAVENOUS
  Filled 2017-05-26: qty 10

## 2017-05-26 MED ORDER — DOXYCYCLINE HYCLATE 100 MG PO TABS: 100.0000 mg | ORAL_TABLET | Freq: Two times a day (BID) | ORAL | 0 refills | Status: DC

## 2017-05-26 MED ORDER — SODIUM CHLORIDE 0.9 % IV SOLN
INTRAVENOUS | Status: DC
  Administered 2017-05-26: 14:00:00 via INTRAVENOUS

## 2017-05-26 MED ORDER — SODIUM CHLORIDE 0.9% FLUSH
10.0000 mL | Freq: Once | INTRAVENOUS | Status: AC
  Administered 2017-05-26: 10 mL
  Filled 2017-05-26: qty 10

## 2017-05-26 MED ORDER — DEXTROSE 5 % IV SOLN
2.0000 g | INTRAVENOUS | Status: DC
  Administered 2017-05-26: 2 g via INTRAVENOUS
  Filled 2017-05-26: qty 2

## 2017-05-26 MED ORDER — CEFUROXIME SODIUM 1.5 G IV SOLR: 2.0000 g | Freq: Every day | INTRAVENOUS | Status: DC

## 2017-05-26 MED ORDER — HEPARIN SOD (PORK) LOCK FLUSH 100 UNIT/ML IV SOLN
500.0000 [IU] | Freq: Once | INTRAVENOUS | Status: AC
  Administered 2017-05-26: 500 [IU] via INTRAVENOUS
  Filled 2017-05-26: qty 5

## 2017-05-26 MED ORDER — HEPARIN SOD (PORK) LOCK FLUSH 100 UNIT/ML IV SOLN
500.0000 [IU] | Freq: Once | INTRAVENOUS | Status: AC
  Administered 2017-05-26: 500 [IU]
  Filled 2017-05-26: qty 5

## 2017-05-26 NOTE — Progress Notes (Signed)
Patient reported to flush room to have labs drawn through [facility]. Flushed saline without any resistance- no blood return noted. Positional change no results in blood return. Patient escorted back to lab and Wilber Bihari, NP notified. Patient also reported bp was 80/50 this morning and she is feeling horrible. Leg weakness, diaphoresis and cold sweat (not chills) she reported no fever.

## 2017-05-26 NOTE — Patient Instructions (Signed)
Dehydration, Adult Dehydration is a condition in which there is not enough fluid or water in the body. This happens when you lose more fluids than you take in. Important organs, such as the kidneys, brain, and heart, cannot function without a proper amount of fluids. Any loss of fluids from the body can lead to dehydration. Dehydration can range from mild to severe. This condition should be treated right away to prevent it from becoming severe. What are the causes? This condition may be caused by:  Vomiting.  Diarrhea.  Excessive sweating, such as from heat exposure or exercise.  Not drinking enough fluid, especially: ? When ill. ? While doing activity that requires a lot of energy.  Excessive urination.  Fever.  Infection.  Certain medicines, such as medicines that cause the body to lose excess fluid (diuretics).  Inability to access safe drinking water.  Reduced physical ability to get adequate water and food.  What increases the risk? This condition is more likely to develop in people:  Who have a poorly controlled long-term (chronic) illness, such as diabetes, heart disease, or kidney disease.  Who are age 49 or older.  Who are disabled.  Who live in a place with high altitude.  Who play endurance sports.  What are the signs or symptoms? Symptoms of mild dehydration may include:  Thirst.  Dry lips.  Slightly dry mouth.  Dry, warm skin.  Dizziness. Symptoms of moderate dehydration may include:  Very dry mouth.  Muscle cramps.  Dark urine. Urine may be the color of tea.  Decreased urine production.  Decreased tear production.  Heartbeat that is irregular or faster than normal (palpitations).  Headache.  Light-headedness, especially when you stand up from a sitting position.  Fainting (syncope). Symptoms of severe dehydration may include:  Changes in skin, such as: ? Cold and clammy skin. ? Blotchy (mottled) or pale skin. ? Skin that does  not quickly return to normal after being lightly pinched and released (poor skin turgor).  Changes in body fluids, such as: ? Extreme thirst. ? No tear production. ? Inability to sweat when body temperature is high, such as in hot weather. ? Very little urine production.  Changes in vital signs, such as: ? Weak pulse. ? Pulse that is more than 100 beats a minute when sitting still. ? Rapid breathing. ? Low blood pressure.  Other changes, such as: ? Sunken eyes. ? Cold hands and feet. ? Confusion. ? Lack of energy (lethargy). ? Difficulty waking up from sleep. ? Short-term weight loss. ? Unconsciousness. How is this diagnosed? This condition is diagnosed based on your symptoms and a physical exam. Blood and urine tests may be done to help confirm the diagnosis. How is this treated? Treatment for this condition depends on the severity. Mild or moderate dehydration can often be treated at home. Treatment should be started right away. Do not wait until dehydration becomes severe. Severe dehydration is an emergency and it needs to be treated in a hospital. Treatment for mild dehydration may include:  Drinking more fluids.  Replacing salts and minerals in your blood (electrolytes) that you may have lost. Treatment for moderate dehydration may include:  Drinking an oral rehydration solution (ORS). This is a drink that helps you replace fluids and electrolytes (rehydrate). It can be found at pharmacies and retail stores. Treatment for severe dehydration may include:  Receiving fluids through an IV tube.  Receiving an electrolyte solution through a feeding tube that is passed through your nose  and into your stomach (nasogastric tube, or NG tube).  Correcting any abnormalities in electrolytes.  Treating the underlying cause of dehydration. Follow these instructions at home:  If directed by your health care provider, drink an ORS: ? Make an ORS by following instructions on the  package. ? Start by drinking small amounts, about  cup (120 mL) every 5-10 minutes. ? Slowly increase how much you drink until you have taken the amount recommended by your health care provider.  Drink enough clear fluid to keep your urine clear or pale yellow. If you were told to drink an ORS, finish the ORS first, then start slowly drinking other clear fluids. Drink fluids such as: ? Water. Do not drink only water. Doing that can lead to having too little salt (sodium) in the body (hyponatremia). ? Ice chips. ? Fruit juice that you have added water to (diluted fruit juice). ? Low-calorie sports drinks.  Avoid: ? Alcohol. ? Drinks that contain a lot of sugar. These include high-calorie sports drinks, fruit juice that is not diluted, and soda. ? Caffeine. ? Foods that are greasy or contain a lot of fat or sugar.  Take over-the-counter and prescription medicines only as told by your health care provider.  Do not take sodium tablets. This can lead to having too much sodium in the body (hypernatremia).  Eat foods that contain a healthy balance of electrolytes, such as bananas, oranges, potatoes, tomatoes, and spinach.  Keep all follow-up visits as told by your health care provider. This is important. Contact a health care provider if:  You have abdominal pain that: ? Gets worse. ? Stays in one area (localizes).  You have a rash.  You have a stiff neck.  You are more irritable than usual.  You are sleepier or more difficult to wake up than usual.  You feel weak or dizzy.  You feel very thirsty.  You have urinated only a small amount of very dark urine over 6-8 hours. Get help right away if:  You have symptoms of severe dehydration.  You cannot drink fluids without vomiting.  Your symptoms get worse with treatment.  You have a fever.  You have a severe headache.  You have vomiting or diarrhea that: ? Gets worse. ? Does not go away.  You have blood or green matter  (bile) in your vomit.  You have blood in your stool. This may cause stool to look black and tarry.  You have not urinated in 6-8 hours.  You faint.  Your heart rate while sitting still is over 100 beats a minute.  You have trouble breathing. This information is not intended to replace advice given to you by your health care provider. Make sure you discuss any questions you have with your health care provider. Document Released: 08/05/2005 Document Revised: 03/01/2016 Document Reviewed: 09/29/2015 Elsevier Interactive Patient Education  [employer].

## 2017-05-27 ENCOUNTER — Telehealth: Payer: Self-pay | Admitting: Oncology

## 2017-05-27 ENCOUNTER — Ambulatory Visit (HOSPITAL_BASED_OUTPATIENT_CLINIC_OR_DEPARTMENT_OTHER): Payer: Medicare Other

## 2017-05-27 ENCOUNTER — Other Ambulatory Visit: Payer: Self-pay | Admitting: Adult Health

## 2017-05-27 VITALS — BP 115/51 | HR 74 | Temp 98.7°F | Resp 17

## 2017-05-27 DIAGNOSIS — C50211 Malignant neoplasm of upper-inner quadrant of right female breast: Secondary | ICD-10-CM

## 2017-05-27 DIAGNOSIS — N61 Mastitis without abscess: Secondary | ICD-10-CM | POA: Diagnosis not present

## 2017-05-27 DIAGNOSIS — Z95828 Presence of other vascular implants and grafts: Secondary | ICD-10-CM

## 2017-05-27 DIAGNOSIS — Z170 Estrogen receptor positive status [ER+]: Secondary | ICD-10-CM

## 2017-05-27 MED ORDER — SODIUM CHLORIDE 0.9 % IV SOLN
Freq: Once | INTRAVENOUS | Status: AC
  Administered 2017-05-27: 15:00:00 via INTRAVENOUS

## 2017-05-27 MED ORDER — HEPARIN SOD (PORK) LOCK FLUSH 100 UNIT/ML IV SOLN
500.0000 [IU] | Freq: Once | INTRAVENOUS | Status: AC
  Administered 2017-05-27: 500 [IU]
  Filled 2017-05-27: qty 5

## 2017-05-27 MED ORDER — DEXTROSE 5 % IV SOLN
2.0000 g | INTRAVENOUS | Status: DC
  Administered 2017-05-27: 2 g via INTRAVENOUS
  Filled 2017-05-27: qty 2

## 2017-05-27 MED ORDER — SODIUM CHLORIDE 0.9% FLUSH
10.0000 mL | Freq: Once | INTRAVENOUS | Status: AC
  Administered 2017-05-27: 10 mL
  Filled 2017-05-27: qty 10

## 2017-05-27 NOTE — Telephone Encounter (Signed)
05/23/17 prescription refill. Prescription scanned in media

## 2017-05-27 NOTE — Telephone Encounter (Signed)
Spoke with patients husband. Patient is aware of appts.

## 2017-05-27 NOTE — Patient Instructions (Signed)
Dehydration, Adult Dehydration is a condition in which there is not enough fluid or water in the body. This happens when you lose more fluids than you take in. Important organs, such as the kidneys, brain, and heart, cannot function without a proper amount of fluids. Any loss of fluids from the body can lead to dehydration. Dehydration can range from mild to severe. This condition should be treated right away to prevent it from becoming severe. What are the causes? This condition may be caused by:  Vomiting.  Diarrhea.  Excessive sweating, such as from heat exposure or exercise.  Not drinking enough fluid, especially: ? When ill. ? While doing activity that requires a lot of energy.  Excessive urination.  Fever.  Infection.  Certain medicines, such as medicines that cause the body to lose excess fluid (diuretics).  Inability to access safe drinking water.  Reduced physical ability to get adequate water and food.  What increases the risk? This condition is more likely to develop in people:  Who have a poorly controlled long-term (chronic) illness, such as diabetes, heart disease, or kidney disease.  Who are age 58 or older.  Who are disabled.  Who live in a place with high altitude.  Who play endurance sports.  What are the signs or symptoms? Symptoms of mild dehydration may include:  Thirst.  Dry lips.  Slightly dry mouth.  Dry, warm skin.  Dizziness. Symptoms of moderate dehydration may include:  Very dry mouth.  Muscle cramps.  Dark urine. Urine may be the color of tea.  Decreased urine production.  Decreased tear production.  Heartbeat that is irregular or faster than normal (palpitations).  Headache.  Light-headedness, especially when you stand up from a sitting position.  Fainting (syncope). Symptoms of severe dehydration may include:  Changes in skin, such as: ? Cold and clammy skin. ? Blotchy (mottled) or pale skin. ? Skin that does  not quickly return to normal after being lightly pinched and released (poor skin turgor).  Changes in body fluids, such as: ? Extreme thirst. ? No tear production. ? Inability to sweat when body temperature is high, such as in hot weather. ? Very little urine production.  Changes in vital signs, such as: ? Weak pulse. ? Pulse that is more than 100 beats a minute when sitting still. ? Rapid breathing. ? Low blood pressure.  Other changes, such as: ? Sunken eyes. ? Cold hands and feet. ? Confusion. ? Lack of energy (lethargy). ? Difficulty waking up from sleep. ? Short-term weight loss. ? Unconsciousness. How is this diagnosed? This condition is diagnosed based on your symptoms and a physical exam. Blood and urine tests may be done to help confirm the diagnosis. How is this treated? Treatment for this condition depends on the severity. Mild or moderate dehydration can often be treated at home. Treatment should be started right away. Do not wait until dehydration becomes severe. Severe dehydration is an emergency and it needs to be treated in a hospital. Treatment for mild dehydration may include:  Drinking more fluids.  Replacing salts and minerals in your blood (electrolytes) that you may have lost. Treatment for moderate dehydration may include:  Drinking an oral rehydration solution (ORS). This is a drink that helps you replace fluids and electrolytes (rehydrate). It can be found at pharmacies and retail stores. Treatment for severe dehydration may include:  Receiving fluids through an IV tube.  Receiving an electrolyte solution through a feeding tube that is passed through your nose  and into your stomach (nasogastric tube, or NG tube).  Correcting any abnormalities in electrolytes.  Treating the underlying cause of dehydration. Follow these instructions at home:  If directed by your health care provider, drink an ORS: ? Make an ORS by following instructions on the  package. ? Start by drinking small amounts, about  cup (120 mL) every 5-10 minutes. ? Slowly increase how much you drink until you have taken the amount recommended by your health care provider.  Drink enough clear fluid to keep your urine clear or pale yellow. If you were told to drink an ORS, finish the ORS first, then start slowly drinking other clear fluids. Drink fluids such as: ? Water. Do not drink only water. Doing that can lead to having too little salt (sodium) in the body (hyponatremia). ? Ice chips. ? Fruit juice that you have added water to (diluted fruit juice). ? Low-calorie sports drinks.  Avoid: ? Alcohol. ? Drinks that contain a lot of sugar. These include high-calorie sports drinks, fruit juice that is not diluted, and soda. ? Caffeine. ? Foods that are greasy or contain a lot of fat or sugar.  Take over-the-counter and prescription medicines only as told by your health care provider.  Do not take sodium tablets. This can lead to having too much sodium in the body (hypernatremia).  Eat foods that contain a healthy balance of electrolytes, such as bananas, oranges, potatoes, tomatoes, and spinach.  Keep all follow-up visits as told by your health care provider. This is important. Contact a health care provider if:  You have abdominal pain that: ? Gets worse. ? Stays in one area (localizes).  You have a rash.  You have a stiff neck.  You are more irritable than usual.  You are sleepier or more difficult to wake up than usual.  You feel weak or dizzy.  You feel very thirsty.  You have urinated only a small amount of very dark urine over 6-8 hours. Get help right away if:  You have symptoms of severe dehydration.  You cannot drink fluids without vomiting.  Your symptoms get worse with treatment.  You have a fever.  You have a severe headache.  You have vomiting or diarrhea that: ? Gets worse. ? Does not go away.  You have blood or green matter  (bile) in your vomit.  You have blood in your stool. This may cause stool to look black and tarry.  You have not urinated in 6-8 hours.  You faint.  Your heart rate while sitting still is over 100 beats a minute.  You have trouble breathing. This information is not intended to replace advice given to you by your health care provider. Make sure you discuss any questions you have with your health care provider. Document Released: 08/05/2005 Document Revised: 03/01/2016 Document Reviewed: 09/29/2015 Elsevier Interactive Patient Education  [employer].

## 2017-05-27 NOTE — Telephone Encounter (Signed)
Refill not viewable

## 2017-05-28 ENCOUNTER — Other Ambulatory Visit (HOSPITAL_BASED_OUTPATIENT_CLINIC_OR_DEPARTMENT_OTHER): Payer: Medicare Other

## 2017-05-28 ENCOUNTER — Ambulatory Visit (HOSPITAL_BASED_OUTPATIENT_CLINIC_OR_DEPARTMENT_OTHER): Payer: Medicare Other | Admitting: Adult Health

## 2017-05-28 ENCOUNTER — Ambulatory Visit (HOSPITAL_BASED_OUTPATIENT_CLINIC_OR_DEPARTMENT_OTHER): Payer: Medicare Other

## 2017-05-28 VITALS — BP 140/66 | HR 74 | Temp 98.6°F | Resp 18 | Ht 64.0 in | Wt 173.9 lb

## 2017-05-28 DIAGNOSIS — Z170 Estrogen receptor positive status [ER+]: Secondary | ICD-10-CM

## 2017-05-28 DIAGNOSIS — C50211 Malignant neoplasm of upper-inner quadrant of right female breast: Secondary | ICD-10-CM

## 2017-05-28 DIAGNOSIS — I427 Cardiomyopathy due to drug and external agent: Secondary | ICD-10-CM

## 2017-05-28 DIAGNOSIS — Z95828 Presence of other vascular implants and grafts: Secondary | ICD-10-CM

## 2017-05-28 DIAGNOSIS — N61 Mastitis without abscess: Secondary | ICD-10-CM

## 2017-05-28 DIAGNOSIS — T451X5A Adverse effect of antineoplastic and immunosuppressive drugs, initial encounter: Secondary | ICD-10-CM

## 2017-05-28 LAB — COMPREHENSIVE METABOLIC PANEL
ALT: 28 U/L (ref 0–55)
ANION GAP: 7 meq/L (ref 3–11)
AST: 11 U/L (ref 5–34)
Albumin: 3.1 g/dL — ABNORMAL LOW (ref 3.5–5.0)
Alkaline Phosphatase: 60 U/L (ref 40–150)
BILIRUBIN TOTAL: 0.36 mg/dL (ref 0.20–1.20)
BUN: 6.3 mg/dL — ABNORMAL LOW (ref 7.0–26.0)
CHLORIDE: 105 meq/L (ref 98–109)
CO2: 27 meq/L (ref 22–29)
Calcium: 8.9 mg/dL (ref 8.4–10.4)
Creatinine: 0.8 mg/dL (ref 0.6–1.1)
Glucose: 90 mg/dl (ref 70–140)
POTASSIUM: 3.8 meq/L (ref 3.5–5.1)
SODIUM: 139 meq/L (ref 136–145)
Total Protein: 6.1 g/dL — ABNORMAL LOW (ref 6.4–8.3)

## 2017-05-28 LAB — CBC WITH DIFFERENTIAL/PLATELET
BASO%: 1.2 % (ref 0.0–2.0)
BASOS ABS: 0 10*3/uL (ref 0.0–0.1)
EOS ABS: 0 10*3/uL (ref 0.0–0.5)
EOS%: 1.9 % (ref 0.0–7.0)
HEMATOCRIT: 29.5 % — AB (ref 34.8–46.6)
HGB: 9.9 g/dL — ABNORMAL LOW (ref 11.6–15.9)
LYMPH#: 0.5 10*3/uL — AB (ref 0.9–3.3)
LYMPH%: 37.3 % (ref 14.0–49.7)
MCH: 31.5 pg (ref 25.1–34.0)
MCHC: 33.6 g/dL (ref 31.5–36.0)
MCV: 93.9 fL (ref 79.5–101.0)
MONO#: 0.1 10*3/uL (ref 0.1–0.9)
MONO%: 7.7 % (ref 0.0–14.0)
NEUT#: 0.7 10*3/uL — ABNORMAL LOW (ref 1.5–6.5)
NEUT%: 51.9 % (ref 38.4–76.8)
PLATELETS: 61 10*3/uL — AB (ref 145–400)
RBC: 3.14 10*6/uL — ABNORMAL LOW (ref 3.70–5.45)
RDW: 13.4 % (ref 11.2–14.5)
WBC: 1.4 10*3/uL — ABNORMAL LOW (ref 3.9–10.3)

## 2017-05-28 MED ORDER — HEPARIN SOD (PORK) LOCK FLUSH 100 UNIT/ML IV SOLN
500.0000 [IU] | Freq: Once | INTRAVENOUS | Status: AC
  Administered 2017-05-28: 500 [IU]
  Filled 2017-05-28: qty 5

## 2017-05-28 MED ORDER — CLINDAMYCIN PHOSPHATE 1 % EX GEL: Freq: Two times a day (BID) | CUTANEOUS | 0 refills | Status: DC

## 2017-05-28 MED ORDER — VANCOMYCIN HCL 1000 MG IV SOLR: 1000.0000 mg | INTRAVENOUS | Status: DC

## 2017-05-28 MED ORDER — SODIUM CHLORIDE 0.9 % IV SOLN
INTRAVENOUS | Status: DC
  Administered 2017-05-28: 14:00:00 via INTRAVENOUS

## 2017-05-28 MED ORDER — VANCOMYCIN HCL IN DEXTROSE 1-5 GM/200ML-% IV SOLN
1000.0000 mg | Freq: Once | INTRAVENOUS | Status: AC
  Administered 2017-05-28: 1000 mg via INTRAVENOUS
  Filled 2017-05-28: qty 200

## 2017-05-28 NOTE — Patient Instructions (Signed)
Clindamycin skin solution or gel What is this medicine? CLINDAMYCIN (Lynn sin) is a lincosamide antibiotic. It is used on the skin to stop the growth of certain bacteria that cause acne. This medicine may be used for other purposes; ask your health care provider or pharmacist if you have questions. COMMON BRAND NAME(S): Cleocin T, Clinda-Derm, Clindagel, ClindaMax What should I tell my health care provider before I take this medicine? They need to know if you have any of these conditions: -diarrhea -inflammatory bowel disease -kidney or liver disease -stomach problems like colitis -an unusual or allergic reaction to clindamycin, lincomycin, other medicines, foods, dyes or preservatives -pregnant or trying to get pregnant -breast-feeding How should I use this medicine? This medicine is for external use only. Wash hands before and after use. Wash affected area and gently pat dry. Apply a thin layer of this medicine to the affected area as often as prescribed by your doctor or health care professional. Do not use skin products near the eyes, nose, or mouth. If you do get any in your eyes rinse out with plenty of cool tap water. Do not use your medicine more often than directed. Talk to your pediatrician regarding the use of this medicine in children. Special care may be needed. While this medicine may be prescribed for children as young as 12 years for selected conditions, precautions do apply. Overdosage: If you think you have taken too much of this medicine contact a poison control center or emergency room at once. NOTE: This medicine is only for you. Do not share this medicine with others. What if I miss a dose? If you miss a dose, use it as soon as you can. If it is almost time for your next dose, use only that dose. Do not use double or extra doses. What may interact with this medicine? -medicated cosmetics, including coverup preparations -other acne products including benzoyl  peroxide, salicylic acid, or tretinoin -skin care products that have a high alcohol content (some shaving creams, lotions, or after shave lotion) -some skin cleansers or medicated soaps This list may not describe all possible interactions. Give your health care provider a list of all the medicines, herbs, non-prescription drugs, or dietary supplements you use. Also tell them if you smoke, drink alcohol, or use illegal drugs. Some items may interact with your medicine. What should I watch for while using this medicine? Your acne should start to get better within about 6 weeks. Complete improvement may take longer. Tell your doctor or health care professional if you do not see any improvement. Your skin may get very dry and scale or peel. Let your doctor or health care professional know if this happens. Do not use any soothing cream or ointment without advice. What side effects may I notice from receiving this medicine? Side effects that you should report to your doctor or health care professional as soon as possible: -allergic reactions like skin rash, itching or hives, swelling of the face, lips, or tongue -diarrhea that is watery or severe -pain on swallowing -stomach pain or cramps -unusual bleeding or bruising Side effects that usually do not require medical attention (report to your doctor or health care professional if they continue or are bothersome): -dry skin -nausea, vomiting This list may not describe all possible side effects. Call your doctor for medical advice about side effects. You may report side effects to FDA at 1-800-FDA-1088. Where should I keep my medicine? Keep out of the reach of children. Store  at room temperature between 20 and 25 degrees C (68 and 77 degrees F). Do not freeze. Throw away any unused medicine after the expiration date. NOTE: This sheet is a summary. It may not cover all possible information. If you have questions about this medicine, talk to your doctor,  pharmacist, or health care provider.  2018 Elsevier/Gold Standard (2013-03-11 16:17:10) Vancomycin injection What is this medicine? VANCOMYCIN Lucianne Lei koe MYE sin) is a glycopeptide antibiotic. It is used to treat certain kinds of bacterial infections. It will not work for colds, flu, or other viral infections. This medicine may be used for other purposes; ask your health care provider or pharmacist if you have questions. COMMON BRAND NAME(S): Vancocin What should I tell my health care provider before I take this medicine? They need to know if you have any of these conditions: -dehydration -hearing loss -kidney disease -other chronic illness -an unusual or allergic reaction to vancomycin, other medicines, foods, dyes, or preservatives -pregnant or trying to get pregnant -breast-feeding How should I use this medicine? This medicine is infused into a vein. It is usually given by a health care provider in a hospital or clinic. If you receive this medicine at home, you will receive special instructions. Take your medicine at regular intervals. Do not take your medicine more often than directed. Take all of your medicine as directed even if you think you are better. Do not skip doses or stop your medicine early. It is important that you put your used needles and syringes in a special sharps container. Do not put them in a trash can. If you do not have a sharps container, call your pharmacist or healthcare provider to get one. Talk to your pediatrician regarding the use of this medicine in children. While this drug may be prescribed for even very young infants for selected conditions, precautions do apply. Overdosage: If you think you have taken too much of this medicine contact a poison control center or emergency room at once. NOTE: This medicine is only for you. Do not share this medicine with others. What if I miss a dose? If you miss a dose, take it as soon as you can. If it is almost time for  your next dose, take only that dose. Do not take double or extra doses. What may interact with this medicine? -amphotericin B -anesthetics -bacitracin -birth control pills -cisplatin -colistin -diuretics -other aminoglycoside antibiotics -polymyxin B This list may not describe all possible interactions. Give your health care provider a list of all the medicines, herbs, non-prescription drugs, or dietary supplements you use. Also tell them if you smoke, drink alcohol, or use illegal drugs. Some items may interact with your medicine. What should I watch for while using this medicine? Tell your doctor or health care professional if your symptoms do not improve or if you get new symptoms. Your condition and lab work will be monitored while you are taking this medicine. Do not treat diarrhea with over the counter products. Contact your doctor if you have diarrhea that lasts more than 2 days or if it is severe and watery. What side effects may I notice from receiving this medicine? Side effects that you should report to your doctor or health care professional as soon as possible: -allergic reactions like skin rash, itching or hives, swelling of the face, lips, or tongue -breathing difficulty, wheezing -change in amount, color of urine -change in hearing -chest pain -dizziness -fever, chills -flushing of the face and neck (reddening) -low  blood pressure -redness, blistering, peeling or loosening of the skin, including inside the mouth -unusual bleeding or bruising -unusually weak or tired Side effects that usually do not require medical attention (report to your doctor or health care professional if they continue or are bothersome): -nausea, vomiting -pain, swelling where injected -stomach cramps This list may not describe all possible side effects. Call your doctor for medical advice about side effects. You may report side effects to FDA at 1-800-FDA-1088. Where should I keep my  medicine? Keep out of the reach of children. You will be instructed on how to store this medicine, if needed. Throw away any unused medicine after the expiration date on the label. NOTE: This sheet is a summary. It may not cover all possible information. If you have questions about this medicine, talk to your doctor, pharmacist, or health care provider.  2018 Elsevier/Gold Standard (2013-03-12 14:46:02)

## 2017-05-28 NOTE — Patient Instructions (Addendum)
Dehydration, Adult Dehydration is a condition in which there is not enough fluid or water in the body. This happens when you lose more fluids than you take in. Important organs, such as the kidneys, brain, and heart, cannot function without a proper amount of fluids. Any loss of fluids from the body can lead to dehydration. Dehydration can range from mild to severe. This condition should be treated right away to prevent it from becoming severe. What are the causes? This condition may be caused by:  Vomiting.  Diarrhea.  Excessive sweating, such as from heat exposure or exercise.  Not drinking enough fluid, especially: ? When ill. ? While doing activity that requires a lot of energy.  Excessive urination.  Fever.  Infection.  Certain medicines, such as medicines that cause the body to lose excess fluid (diuretics).  Inability to access safe drinking water.  Reduced physical ability to get adequate water and food.  What increases the risk? This condition is more likely to develop in people:  Who have a poorly controlled long-term (chronic) illness, such as diabetes, heart disease, or kidney disease.  Who are age 69 or older.  Who are disabled.  Who live in a place with high altitude.  Who play endurance sports.  What are the signs or symptoms? Symptoms of mild dehydration may include:  Thirst.  Dry lips.  Slightly dry mouth.  Dry, warm skin.  Dizziness. Symptoms of moderate dehydration may include:  Very dry mouth.  Muscle cramps.  Dark urine. Urine may be the color of tea.  Decreased urine production.  Decreased tear production.  Heartbeat that is irregular or faster than normal (palpitations).  Headache.  Light-headedness, especially when you stand up from a sitting position.  Fainting (syncope). Symptoms of severe dehydration may include:  Changes in skin, such as: ? Cold and clammy skin. ? Blotchy (mottled) or pale skin. ? Skin that does  not quickly return to normal after being lightly pinched and released (poor skin turgor).  Changes in body fluids, such as: ? Extreme thirst. ? No tear production. ? Inability to sweat when body temperature is high, such as in hot weather. ? Very little urine production.  Changes in vital signs, such as: ? Weak pulse. ? Pulse that is more than 100 beats a minute when sitting still. ? Rapid breathing. ? Low blood pressure.  Other changes, such as: ? Sunken eyes. ? Cold hands and feet. ? Confusion. ? Lack of energy (lethargy). ? Difficulty waking up from sleep. ? Short-term weight loss. ? Unconsciousness. How is this diagnosed? This condition is diagnosed based on your symptoms and a physical exam. Blood and urine tests may be done to help confirm the diagnosis. How is this treated? Treatment for this condition depends on the severity. Mild or moderate dehydration can often be treated at home. Treatment should be started right away. Do not wait until dehydration becomes severe. Severe dehydration is an emergency and it needs to be treated in a hospital. Treatment for mild dehydration may include:  Drinking more fluids.  Replacing salts and minerals in your blood (electrolytes) that you may have lost. Treatment for moderate dehydration may include:  Drinking an oral rehydration solution (ORS). This is a drink that helps you replace fluids and electrolytes (rehydrate). It can be found at pharmacies and retail stores. Treatment for severe dehydration may include:  Receiving fluids through an IV tube.  Receiving an electrolyte solution through a feeding tube that is passed through your nose  and into your stomach (nasogastric tube, or NG tube).  Correcting any abnormalities in electrolytes.  Treating the underlying cause of dehydration. Follow these instructions at home:  If directed by your health care provider, drink an ORS: ? Make an ORS by following instructions on the  package. ? Start by drinking small amounts, about  cup (120 mL) every 5-10 minutes. ? Slowly increase how much you drink until you have taken the amount recommended by your health care provider.  Drink enough clear fluid to keep your urine clear or pale yellow. If you were told to drink an ORS, finish the ORS first, then start slowly drinking other clear fluids. Drink fluids such as: ? Water. Do not drink only water. Doing that can lead to having too little salt (sodium) in the body (hyponatremia). ? Ice chips. ? Fruit juice that you have added water to (diluted fruit juice). ? Low-calorie sports drinks.  Avoid: ? Alcohol. ? Drinks that contain a lot of sugar. These include high-calorie sports drinks, fruit juice that is not diluted, and soda. ? Caffeine. ? Foods that are greasy or contain a lot of fat or sugar.  Take over-the-counter and prescription medicines only as told by your health care provider.  Do not take sodium tablets. This can lead to having too much sodium in the body (hypernatremia).  Eat foods that contain a healthy balance of electrolytes, such as bananas, oranges, potatoes, tomatoes, and spinach.  Keep all follow-up visits as told by your health care provider. This is important. Contact a health care provider if:  You have abdominal pain that: ? Gets worse. ? Stays in one area (localizes).  You have a rash.  You have a stiff neck.  You are more irritable than usual.  You are sleepier or more difficult to wake up than usual.  You feel weak or dizzy.  You feel very thirsty.  You have urinated only a small amount of very dark urine over 6-8 hours. Get help right away if:  You have symptoms of severe dehydration.  You cannot drink fluids without vomiting.  Your symptoms get worse with treatment.  You have a fever.  You have a severe headache.  You have vomiting or diarrhea that: ? Gets worse. ? Does not go away.  You have blood or green matter  (bile) in your vomit.  You have blood in your stool. This may cause stool to look black and tarry.  You have not urinated in 6-8 hours.  You faint.  Your heart rate while sitting still is over 100 beats a minute.  You have trouble breathing. This information is not intended to replace advice given to you by your health care provider. Make sure you discuss any questions you have with your health care provider. Document Released: 08/05/2005 Document Revised: 03/01/2016 Document Reviewed: 09/29/2015 Elsevier Interactive Patient Education  2018 [place]. Vancomycin injection What is this medicine? VANCOMYCIN Lucianne Lei koe MYE sin) is a glycopeptide antibiotic. It is used to treat certain kinds of bacterial infections. It will not work for colds, flu, or other viral infections. This medicine may be used for other purposes; ask your health care provider or pharmacist if you have questions. COMMON BRAND NAME(S): Vancocin What should I tell my health care provider before I take this medicine? They need to know if you have any of these conditions: -dehydration -hearing loss -kidney disease -other chronic illness -an unusual or allergic reaction to vancomycin, other medicines, foods, dyes, or preservatives -pregnant  or trying to get pregnant -breast-feeding How should I use this medicine? This medicine is infused into a vein. It is usually given by a health care provider in a hospital or clinic. If you receive this medicine at home, you will receive special instructions. Take your medicine at regular intervals. Do not take your medicine more often than directed. Take all of your medicine as directed even if you think you are better. Do not skip doses or stop your medicine early. It is important that you put your used needles and syringes in a special sharps container. Do not put them in a trash can. If you do not have a sharps container, call your pharmacist or healthcare provider to get  one. Talk to your pediatrician regarding the use of this medicine in children. While this drug may be prescribed for even very young infants for selected conditions, precautions do apply. Overdosage: If you think you have taken too much of this medicine contact a poison control center or emergency room at once. NOTE: This medicine is only for you. Do not share this medicine with others. What if I miss a dose? If you miss a dose, take it as soon as you can. If it is almost time for your next dose, take only that dose. Do not take double or extra doses. What may interact with this medicine? -amphotericin B -anesthetics -bacitracin -birth control pills -cisplatin -colistin -diuretics -other aminoglycoside antibiotics -polymyxin B This list may not describe all possible interactions. Give your health care provider a list of all the medicines, herbs, non-prescription drugs, or dietary supplements you use. Also tell them if you smoke, drink alcohol, or use illegal drugs. Some items may interact with your medicine. What should I watch for while using this medicine? Tell your doctor or health care professional if your symptoms do not improve or if you get new symptoms. Your condition and lab work will be monitored while you are taking this medicine. Do not treat diarrhea with over the counter products. Contact your doctor if you have diarrhea that lasts more than 2 days or if it is severe and watery. What side effects may I notice from receiving this medicine? Side effects that you should report to your doctor or health care professional as soon as possible: -allergic reactions like skin rash, itching or hives, swelling of the face, lips, or tongue -breathing difficulty, wheezing -change in amount, color of urine -change in hearing -chest pain -dizziness -fever, chills -flushing of the face and neck (reddening) -low blood pressure -redness, blistering, peeling or loosening of the skin,  including inside the mouth -unusual bleeding or bruising -unusually weak or tired Side effects that usually do not require medical attention (report to your doctor or health care professional if they continue or are bothersome): -nausea, vomiting -pain, swelling where injected -stomach cramps This list may not describe all possible side effects. Call your doctor for medical advice about side effects. You may report side effects to FDA at 1-800-FDA-1088. Where should I keep my medicine? Keep out of the reach of children. You will be instructed on how to store this medicine, if needed. Throw away any unused medicine after the expiration date on the label. NOTE: This sheet is a summary. It may not cover all possible information. If you have questions about this medicine, talk to your doctor, pharmacist, or health care provider.  2018 Elsevier/Gold Standard (2013-03-12 14:46:02)

## 2017-05-28 NOTE — Progress Notes (Signed)
Per Wilber Bihari NP ok to give pt only  524m of  IVF today.

## 2017-05-28 NOTE — Progress Notes (Addendum)
[place]  [phone] Fax:(336) [phone]     ID: Mckenzie Webb DOB: 1946/07/26  MR#: [id number]  LZJ#:[id number]  Patient Care Team: Rita Ohara, MD as PCP - General (Family Medicine) Jovita Kussmaul, MD as Consulting Physician (General Surgery) Magrinat, Virgie Dad, MD as Consulting Physician (Oncology) Eppie Gibson, MD as Attending Physician (Radiation Oncology) Juanita Craver, MD as Consulting Physician (Gastroenterology) Vania Rea, MD as Consulting Physician (Obstetrics and Gynecology) Elsie Saas, MD as Consulting Physician (Orthopedic Surgery) Martinique, Amy, MD as Consulting Physician (Dermatology) Scot Dock, NP OTHER MD:  CHIEF COMPLAINT: Estrogen receptor positive breast cancer  CURRENT TREATMENT: Adjuvant chemotherapy   BREAST CANCER HISTORY: From the original intake note:  "Mckenzie Webb" had bilateral screening mammography with tomography at the [facility] 02/21/2017. This showed a possible mass in the right breast. Right diagnostic mammography with tomography and ultrasonography 02/27/2017 found a breast density to be category B. In the subareolar right breast there was a 0.5 cm spiculated mass, which was not palpable. There was mild right nipple inversion, which per report was chronic. Ultrasonography confirmed a 0.5 cm retroareolar breast mass at the 1:00 radiant. The right axilla was sonographically benign.  On 03/03/2017 the patient underwent biopsy of the right breast mass in question. This showed ([id number]) and invasive ductal carcinoma, grade 1, estrogen receptor 100% positive, progesterone receptor negative, with an MIB-1 of 3%, and no HER-2 implication, the signals ratio being 1.25 and the number per cell 2.00.  The patient's subsequent history is as detailed below.  INTERVAL HISTORY: Mckenzie Webb returns today for follow up and treatment of her estrogen receptor positive breast cancer.  Today I am seeing her as follow up of her  right breast cellulitis.    REVIEW OF SYSTEMS: Mckenzie Webb's breast is more tender and more erythematous.  It continues to have a mild amount of drainage that comes out on her bra from time to time.  She denies any fevers, chills, or other concerns today.  A detailed ROS is otherwise non contributory.    PAST MEDICAL HISTORY: Past Medical History:  Diagnosis Date  . Allergy   . DJD (degenerative joint disease), cervical 04 2002  . Edema    resolved  . Family history of colon cancer   . Family history of pancreatic cancer   . GERD (gastroesophageal reflux disease)   . Hyperlipidemia   . Hypothyroid   . Interstitial cystitis   . Leukopenia   . PONV (postoperative nausea and vomiting)   . Psoriasis     PAST SURGICAL HISTORY: Past Surgical History:  Procedure Laterality Date  . BREAST LUMPECTOMY WITH RADIOACTIVE SEED AND SENTINEL LYMPH NODE BIOPSY Right 03/26/2017   Procedure: BREAST LUMPECTOMY WITH RADIOACTIVE SEED AND SENTINEL LYMPH NODE BIOPSY;  Surgeon: Jovita Kussmaul, MD;  Location: [facility];  Service: General;  Laterality: Right;  . CHOLECYSTECTOMY    . COLONOSCOPY  3/09  . KNEE ARTHROSCOPY Right 1990  . KNEE ARTHROSCOPY Left 12/12/2010    (Dr. Noemi Chapel)  . PORTACATH PLACEMENT Left 04/24/2017   Procedure: INSERTION PORT-A-CATH;  Surgeon: Jovita Kussmaul, MD;  Location: [facility];  Service: General;  Laterality: Left;  . TUBAL LIGATION  1983  . VARICOSE VEIN SURGERY  09/2009 R, 06/2010 L    FAMILY HISTORY Family History  Problem Relation Age of Onset  . Hypertension Mother   . Heart disease Mother   . Gallbladder disease Mother   . Thyroid disease Mother   .  Arthritis Mother   . Diabetes Father   . Cancer Father        pancreatic  . Hypertension Sister   . Gallbladder disease Sister   . Diabetes Sister   . Diverticulitis Sister        of small intestine  . Cancer Son        testicular cancer  . Stroke Sister        late 93's  . Hypertension Sister   .  Pulmonary embolism Sister   . Ulcerative colitis Sister   . COPD Brother        had lung lobe removed for suspected cancer, but was benign; smoker  . Colon cancer Other 27  . Lung cancer Cousin        maternal first cousin - smoker  . Breast cancer Neg Hx   The patient's father died from pancreatic cancer at the age of 33. The patient's mother died from a myocardial infarction at age 79. The patient had one brother, 3 sisters. The patient's older son was diagnosed with testicular cancer at age 4. A maternal niece was diagnosed with colon cancer at age 49 and a maternal cousin with lung cancer at an unknown age.  GYNECOLOGIC HISTORY:  No LMP recorded. Patient is postmenopausal.  Menarche age 55, first live birth age 69, the patient is [place] P2. she went through the change of life approximately the year 2000. She did not take hormone replacement. She did take oral contraceptives for more than 20 years remotely, with no complications.  SOCIAL HISTORY:  She is a retired Web designer. Her husband Arnette Norris is an Clinical biochemist, still working part-time. Son Corene Cornea (from the patient's first marriage) lives in [place] and works in Therapist, art. Son Catalina Antigua (from patient second marriage) lives in [place] we are is Comptroller. The patient has one grandchild. She is not a Ambulance person.   ADVANCED DIRECTIVES: Not in place   HEALTH MAINTENANCE: Social History  Substance Use Topics  . Smoking status: Never Smoker  . Smokeless tobacco: Never Used  . Alcohol use No     Comment: social, 1-2 times per year     Colonoscopy:2009/[facility] and  PAP:  [facility], T score -0.1 (normal)   Allergies  Allergen Reactions  . Iodine Swelling  . Sulfa Antibiotics Swelling    Lips swell, itching  . Penicillins Rash    Has patient had a PCN reaction causing immediate rash, facial/tongue/throat swelling, SOB or lightheadedness with hypotension: No Has patient had a  PCN reaction causing severe rash involving mucus membranes or skin necrosis: No Has patient had a PCN reaction that required hospitalization: No Has patient had a PCN reaction occurring within the last 10 years: No If all of the above answers are "NO", then may proceed with Cephalosporin use.   Newell Coral [Bacitracin-Polymyxin B] Swelling    Used for eye infection--got redder and worse  . Ciprofloxacin     After she takes for a while it gives her a "funny feeling."  . Codeine Nausea Only    Current Outpatient Prescriptions  Medication Sig Dispense Refill  . anastrozole (ARIMIDEX) 1 MG tablet Take 1 tablet (1 mg total) by mouth daily. 90 tablet 4  . Calcium Carbonate-Vitamin D (CALCIUM 600+D) 600-400 MG-UNIT tablet Take 1 tablet by mouth daily.    . Cholecalciferol (VITAMIN D) 1000 UNITS capsule Take 1,000 Units by mouth daily.      . clindamycin (CLINDAGEL) 1 %  gel Apply topically 2 (two) times daily. 30 g 0  . Coenzyme Q10 (COQ10) 100 MG CAPS Take 100 mg by mouth daily.    Marland Kitchen dexamethasone (DECADRON) 4 MG tablet Take 2 tablets by mouth once a day on the day after chemotherapy and then take 2 tablets two times a day for 2 days. Take with food. 30 tablet 1  . diclofenac (VOLTAREN) 75 MG EC tablet Take 75 mg by mouth 2 (two) times daily.    Marland Kitchen doxycycline (VIBRA-TABS) 100 MG tablet Take 1 tablet (100 mg total) by mouth 2 (two) times daily. 14 tablet 0  . levothyroxine (SYNTHROID, LEVOTHROID) 88 MCG tablet Take 1 tablet (88 mcg total) by mouth daily. (Patient taking differently: Take 88 mcg by mouth daily before breakfast. 30 minutes before eating anything) 90 tablet 3  . lidocaine-prilocaine (EMLA) cream APPLY TO AFFECTED AREA ONCE  3  . lidocaine-prilocaine (EMLA) cream Apply to affected area once 30 g 3  . loratadine (CLARITIN) 10 MG tablet Take 10 mg by mouth daily as needed for allergies.     Marland Kitchen LORazepam (ATIVAN) 0.5 MG tablet Take 0.5 mg by mouth at bedtime as needed.  0  . LORazepam  (ATIVAN) 0.5 MG tablet Take 1 tablet (0.5 mg total) by mouth every 6 (six) hours as needed (Nausea or vomiting). 30 tablet 0  . Multiple Vitamins-Minerals (ALIVE WOMENS 50+ PO) Take 1 tablet by mouth daily.     . Omega-3 Fatty Acids (FISH OIL) 1200 MG CAPS Take 2,400 mg by mouth at bedtime.    Marland Kitchen omeprazole (PRILOSEC) 40 MG capsule TAKE ONE (1) CAPSULE EACH DAY 90 capsule 2  . ondansetron (ZOFRAN) 8 MG tablet Take 1 tablet (8 mg total) by mouth 2 (two) times daily as needed. Start on the third day after chemotherapy. 30 tablet 1  . prochlorperazine (COMPAZINE) 10 MG tablet Take 1 tablet (10 mg total) by mouth every 6 (six) hours as needed (Nausea or vomiting). 30 tablet 1  . Red Yeast Rice Extract (RED YEAST RICE PO) Take 1,200 mg by mouth 2 (two) times daily.     Marland Kitchen venlafaxine XR (EFFEXOR-XR) 75 MG 24 hr capsule Take 1 capsule (75 mg total) by mouth daily with breakfast. 30 capsule 0  . vitamin B-12 (CYANOCOBALAMIN) 1000 MCG tablet Take 1,000 mcg by mouth daily.     Current Facility-Administered Medications  Medication Dose Route Frequency Provider Last Rate Last Dose  . vancomycin (VANCOCIN) IVPB 1000 mg/200 mL premix  1,000 mg Intravenous Once Gardenia Phlegm, NP        OBJECTIVE:  Vitals:   05/28/17 1217  BP: 140/66  Pulse: 74  Resp: 18  Temp: 98.6 F (37 C)  SpO2: 100%     Body mass index is 29.85 kg/m.   Wt Readings from Last 3 Encounters:  05/28/17 173 lb 14.4 oz (78.9 kg)  05/26/17 171 lb 11.2 oz (77.9 kg)  05/19/17 172 lb 1.6 oz (78.1 kg)  ECOG FS:0 - Asymptomatic GENERAL: Patient is a tired appearing female in no acute distress HEENT:  Sclerae anicteric.  PERRL. Oropharynx clear and moist. No ulcerations or evidence of oropharyngeal candidiasis. Neck is supple.  NODES:  No cervical, supraclavicular, or axillary lymphadenopathy palpated.  BREAST EXAM:  Lumpectomy site with worsening erythema surrounding it, warmth, and tenderness, mild amount of non purulent  drainage present LUNGS:  Clear to auscultation bilaterally.  No wheezes or rhonchi. HEART:  Regular rate and rhythm. No murmur appreciated. ABDOMEN:  Soft, nontender.  Positive, normoactive bowel sounds. No organomegaly palpated. MSK:  No focal spinal tenderness to palpation. Full range of motion bilaterally in the upper extremities. EXTREMITIES:  No peripheral edema.   SKIN:  Clear with no obvious rashes or skin changes. No nail dyscrasia. NEURO:  Nonfocal. Well oriented.  Appropriate affect.  LAB RESULTS:  CMP     Component Value Date/Time   NA 139 05/28/2017 1150   K 3.8 05/28/2017 1150   CL 100 (L) 05/02/2017 2301   CO2 27 05/28/2017 1150   GLUCOSE 90 05/28/2017 1150   BUN 6.3 (L) 05/28/2017 1150   CREATININE 0.8 05/28/2017 1150   CALCIUM 8.9 05/28/2017 1150   PROT 6.1 (L) 05/28/2017 1150   ALBUMIN 3.1 (L) 05/28/2017 1150   AST 11 05/28/2017 1150   ALT 28 05/28/2017 1150   ALKPHOS 60 05/28/2017 1150   BILITOT 0.36 05/28/2017 1150   GFRNONAA >60 05/02/2017 2301   GFRAA >60 05/02/2017 2301    No results found for: TOTALPROTELP, ALBUMINELP, A1GS, A2GS, BETS, BETA2SER, GAMS, MSPIKE, SPEI  No results found for: KPAFRELGTCHN, LAMBDASER, [facility]  Lab Results  Component Value Date   WBC 1.4 (L) 05/28/2017   NEUTROABS 0.7 (L) 05/28/2017   HGB 9.9 (L) 05/28/2017   HCT 29.5 (L) 05/28/2017   MCV 93.9 05/28/2017   PLT 61 (L) 05/28/2017      Chemistry      Component Value Date/Time   NA 139 05/28/2017 1150   K 3.8 05/28/2017 1150   CL 100 (L) 05/02/2017 2301   CO2 27 05/28/2017 1150   BUN 6.3 (L) 05/28/2017 1150   CREATININE 0.8 05/28/2017 1150      Component Value Date/Time   CALCIUM 8.9 05/28/2017 1150   ALKPHOS 60 05/28/2017 1150   AST 11 05/28/2017 1150   ALT 28 05/28/2017 1150   BILITOT 0.36 05/28/2017 1150       No results found for: LABCA2  No components found for: [id number]  No results for input(s): INR in the last 168 hours.  Urinalysis      Component Value Date/Time   COLORURINE YELLOW 05/02/2017 2201   APPEARANCEUR HAZY (A) 05/02/2017 2201   LABSPEC 1.014 05/02/2017 2201   PHURINE 6.0 05/02/2017 2201   GLUCOSEU NEGATIVE 05/02/2017 2201   HGBUR SMALL (A) 05/02/2017 2201   BILIRUBINUR NEGATIVE 05/02/2017 2201   BILIRUBINUR neg 08/17/2014 1123   KETONESUR NEGATIVE 05/02/2017 2201   PROTEINUR NEGATIVE 05/02/2017 2201   UROBILINOGEN negative 08/17/2014 1123   NITRITE POSITIVE (A) 05/02/2017 2201   LEUKOCYTESUR LARGE (A) 05/02/2017 2201     STUDIES: Dg Chest 2 View  Result Date: 05/02/2017 CLINICAL DATA:  Initial evaluation for acute weakness, cough. EXAM: CHEST  2 VIEW COMPARISON:  Prior radiograph from 04/24/2017. FINDINGS: Left-sided Port-A-Cath. Cardiac and mediastinal silhouettes are stable, and remain within normal limits. Lungs mildly hypoinflated. Mild atelectatic changes at the left lung base. No focal infiltrates. No pulmonary edema or pleural effusion. No pneumothorax. No acute osseus abnormality. IMPRESSION: 1. Mild left basilar atelectasis. 2. No other active cardiopulmonary disease. Electronically Signed   By: Jeannine Boga M.D.   On: 05/02/2017 23:10   Ct Head Wo Contrast  Result Date: 05/03/2017 CLINICAL DATA:  Acute headache. Normal neurologic exam. Recently diagnosed with breast cancer. EXAM: CT HEAD WITHOUT CONTRAST TECHNIQUE: Contiguous axial images were obtained from the base of the skull through the vertex without intravenous contrast. COMPARISON:  None. FINDINGS: Brain: No intracranial hemorrhage, mass effect, or midline  shift. No evidence of intracranial mass on noncontrast CT. No hydrocephalus. The basilar cisterns are patent. No evidence of territorial infarct or acute ischemia. No extra-axial or intracranial fluid collection. Vascular: No hyperdense vessel. Skull: No focal lesion.  No skull fracture. Sinuses/Orbits: Paranasal sinuses and mastoid air cells are clear. The visualized orbits are  unremarkable. Other: None. IMPRESSION: 1.  No acute intracranial abnormality. 2. No evidence of intracranial metastatic disease or mass on noncontrast head CT. If there is persistent clinical concern, recommend brain MRI, preferably with contrast. Electronically Signed   By: Jeb Levering M.D.   On: 05/03/2017 02:31    ELIGIBLE FOR AVAILABLE RESEARCH PROTOCOL: no  ASSESSMENT: 71 y.o.  woman status post right breast upper inner quadrant biopsy 03/04/2015 for a clinical T1a N0, stage IA invasive ductal carcinoma, grade 1, estrogen receptor positive, progesterone receptor negative, with an MIB-1 of 3% and no HER-2 amplification  (1) anastrozole started neoadjuvantly 03/12/2017 in anticipation of possible surgical delays   (2) genetics testingNegative genetic testing on the common hereditary cancer panel.  The Hereditary Gene Panel offered by [facility] includes sequencing and/or deletion duplication testing of the following 46 genes: APC, ATM, AXIN2, BARD1, BMPR1A, BRCA1, BRCA2, BRIP1, CDH1, CDKN2A (p14ARF), CDKN2A (p16INK4a), CHEK2, CTNNA1, DICER1, EPCAM (Deletion/duplication testing only), GREM1 (promoter region deletion/duplication testing only), KIT, MEN1, MLH1, MSH2, MSH3, MSH6, MUTYH, NBN, NF1, NHTL1, PALB2, PDGFRA, PMS2, POLD1, POLE, PTEN, RAD50, RAD51C, RAD51D, SDHB, SDHC, SDHD, SMAD4, SMARCA4. STK11, TP53, TSC1, TSC2, and VHL.  The following genes were evaluated for sequence changes only: SDHA and HOXB13 c.251G>A variant only.  The report date is April 12, 2017.  (3) right lumpectomy and sentinel lymph node biopsy 03/26/2017 showed a pT1c pN1, stage IB invasive ductal carcinoma, grade 1, with negative margins  (4) Mammaprint returned high risk, indicating need for adjuvant chemotherapy  (5) chemotherapy will consist of Cytoxan and docetaxel given every 3 weeks 4 beginning on 04/28/2017  (a) changed to Doxorubicin and Cyclophosphamide beginning 05/19/2017, to complete 3 cycles  (b)  dose reduced by 20% with the second and third CA treatments  (6) adjuvant radiation to follow   (7) continue anti-estrogens to a total of 5 years.  PLAN: Mckenzie Webb's breast appears worse today.   Her WBC is recovering.  Dr. [name] and I saw her together.  She will receive IV Vancomycin x 3 days at 1g per day.  She will not get any vanc levels during this time.  She will stop receiving IV Ceftriaxone and PO Doxycycline. She will see [place] again on Friday and have labs that day as well.  I also sent in Clindagel to her pharmacy.    Mckenzie Webb knows to call for any questions or concerns prior to her next appointment.    Scot Dock, NP   05/28/2017 1:54 PM Medical Oncology and Hematology [facility] [address] [place], [place] [address] Tel. [phone]    Fax. [phone]   ADDENDUM: Pat's breast dissection is not responding to the current antibiotics. I think we need to switch to vancomycin. We will do this and minimum of 3 days. She is going to return on Friday, 05/30/2017 for reassessment. Hopefully by then not only will she have responded to the antibiotic but her counts will be well on the way to recovery  We stressed the fact that if she starts feeling poorly, faint T, week, or develops a fever she needs to call immediately.  I personally saw this patient and performed a  substantive portion of this encounter with the listed APP documented above.   Chauncey Cruel, MD Medical Oncology and Hematology [facility] [address] [place], [place] [address] Tel. [phone]    Fax. [phone]

## 2017-05-29 ENCOUNTER — Other Ambulatory Visit: Payer: Self-pay

## 2017-05-29 ENCOUNTER — Telehealth: Payer: Self-pay

## 2017-05-29 ENCOUNTER — Telehealth: Payer: Self-pay | Admitting: Oncology

## 2017-05-29 ENCOUNTER — Ambulatory Visit (HOSPITAL_BASED_OUTPATIENT_CLINIC_OR_DEPARTMENT_OTHER): Payer: Medicare Other

## 2017-05-29 ENCOUNTER — Other Ambulatory Visit: Payer: Self-pay | Admitting: Oncology

## 2017-05-29 VITALS — BP 147/62 | HR 73 | Temp 99.1°F | Resp 18

## 2017-05-29 DIAGNOSIS — Z170 Estrogen receptor positive status [ER+]: Secondary | ICD-10-CM

## 2017-05-29 DIAGNOSIS — C50211 Malignant neoplasm of upper-inner quadrant of right female breast: Secondary | ICD-10-CM

## 2017-05-29 DIAGNOSIS — L0390 Cellulitis, unspecified: Secondary | ICD-10-CM

## 2017-05-29 DIAGNOSIS — Z95828 Presence of other vascular implants and grafts: Secondary | ICD-10-CM

## 2017-05-29 DIAGNOSIS — N61 Mastitis without abscess: Secondary | ICD-10-CM

## 2017-05-29 MED ORDER — PROCHLORPERAZINE MALEATE 10 MG PO TABS: 10.0000 mg | ORAL_TABLET | Freq: Four times a day (QID) | ORAL | 1 refills | Status: DC | PRN

## 2017-05-29 MED ORDER — VANCOMYCIN HCL IN DEXTROSE 1-5 GM/200ML-% IV SOLN
1000.0000 mg | Freq: Once | INTRAVENOUS | Status: AC
  Administered 2017-05-29: 1000 mg via INTRAVENOUS
  Filled 2017-05-29: qty 200

## 2017-05-29 MED ORDER — SODIUM CHLORIDE 0.9 % IV SOLN
Freq: Once | INTRAVENOUS | Status: AC
  Administered 2017-05-29: 500 mL via INTRAVENOUS

## 2017-05-29 MED ORDER — HEPARIN SOD (PORK) LOCK FLUSH 100 UNIT/ML IV SOLN
500.0000 [IU] | Freq: Once | INTRAVENOUS | Status: AC
  Administered 2017-05-29: 500 [IU]
  Filled 2017-05-29: qty 5

## 2017-05-29 MED ORDER — VENLAFAXINE HCL ER 75 MG PO CP24: 75.0000 mg | ORAL_CAPSULE | Freq: Every day | ORAL | 1 refills | Status: DC

## 2017-05-29 MED ORDER — SODIUM CHLORIDE 0.9% FLUSH
10.0000 mL | Freq: Once | INTRAVENOUS | Status: AC
  Administered 2017-05-29: 10 mL
  Filled 2017-05-29: qty 10

## 2017-05-29 MED ORDER — VANCOMYCIN HCL 1000 MG IV SOLR: 1000.0000 mg | Freq: Once | INTRAVENOUS | Status: DC

## 2017-05-29 MED ORDER — VANCOMYCIN HCL IN DEXTROSE 1-5 GM/200ML-% IV SOLN
1000.0000 mg | Freq: Two times a day (BID) | INTRAVENOUS | Status: DC
  Filled 2017-05-29: qty 200

## 2017-05-29 NOTE — Telephone Encounter (Signed)
E scribing for venlafaxine and prochorperazine failed x 2.  Prescriptions called in to pharmacy by this RN.

## 2017-05-29 NOTE — Telephone Encounter (Signed)
05/28/17 prescription refill scanned in media to view

## 2017-05-29 NOTE — Telephone Encounter (Signed)
Resent

## 2017-05-29 NOTE — Patient Instructions (Signed)

## 2017-05-29 NOTE — Telephone Encounter (Signed)
05/28/17 prescription refill scanned in media

## 2017-05-30 ENCOUNTER — Ambulatory Visit (HOSPITAL_BASED_OUTPATIENT_CLINIC_OR_DEPARTMENT_OTHER): Payer: Medicare Other | Admitting: Adult Health

## 2017-05-30 ENCOUNTER — Ambulatory Visit (HOSPITAL_BASED_OUTPATIENT_CLINIC_OR_DEPARTMENT_OTHER): Payer: Medicare Other

## 2017-05-30 ENCOUNTER — Ambulatory Visit: Payer: Medicare Other

## 2017-05-30 ENCOUNTER — Other Ambulatory Visit (HOSPITAL_BASED_OUTPATIENT_CLINIC_OR_DEPARTMENT_OTHER): Payer: Medicare Other

## 2017-05-30 ENCOUNTER — Encounter: Payer: Self-pay | Admitting: Adult Health

## 2017-05-30 VITALS — BP 157/74 | HR 74 | Temp 98.1°F | Resp 20 | Ht 64.0 in | Wt 173.4 lb

## 2017-05-30 DIAGNOSIS — C50211 Malignant neoplasm of upper-inner quadrant of right female breast: Secondary | ICD-10-CM

## 2017-05-30 DIAGNOSIS — N61 Mastitis without abscess: Secondary | ICD-10-CM | POA: Diagnosis not present

## 2017-05-30 DIAGNOSIS — Z170 Estrogen receptor positive status [ER+]: Principal | ICD-10-CM

## 2017-05-30 DIAGNOSIS — Z95828 Presence of other vascular implants and grafts: Secondary | ICD-10-CM

## 2017-05-30 DIAGNOSIS — I427 Cardiomyopathy due to drug and external agent: Secondary | ICD-10-CM

## 2017-05-30 DIAGNOSIS — T451X5A Adverse effect of antineoplastic and immunosuppressive drugs, initial encounter: Secondary | ICD-10-CM

## 2017-05-30 LAB — CBC WITH DIFFERENTIAL/PLATELET
BASO%: 0.6 % (ref 0.0–2.0)
Basophils Absolute: 0 10*3/uL (ref 0.0–0.1)
EOS%: 0.5 % (ref 0.0–7.0)
Eosinophils Absolute: 0 10*3/uL (ref 0.0–0.5)
HEMATOCRIT: 29.5 % — AB (ref 34.8–46.6)
HEMOGLOBIN: 9.9 g/dL — AB (ref 11.6–15.9)
LYMPH#: 0.6 10*3/uL — AB (ref 0.9–3.3)
LYMPH%: 17.5 % (ref 14.0–49.7)
MCH: 31.4 pg (ref 25.1–34.0)
MCHC: 33.5 g/dL (ref 31.5–36.0)
MCV: 93.7 fL (ref 79.5–101.0)
MONO#: 0.2 10*3/uL (ref 0.1–0.9)
MONO%: 7 % (ref 0.0–14.0)
NEUT#: 2.5 10*3/uL (ref 1.5–6.5)
NEUT%: 74.4 % (ref 38.4–76.8)
Platelets: 86 10*3/uL — ABNORMAL LOW (ref 145–400)
RBC: 3.15 10*6/uL — ABNORMAL LOW (ref 3.70–5.45)
RDW: 13.4 % (ref 11.2–14.5)
WBC: 3.3 10*3/uL — AB (ref 3.9–10.3)

## 2017-05-30 LAB — COMPREHENSIVE METABOLIC PANEL
ALBUMIN: 3.2 g/dL — AB (ref 3.5–5.0)
ALK PHOS: 63 U/L (ref 40–150)
ALT: 26 U/L (ref 0–55)
AST: 15 U/L (ref 5–34)
Anion Gap: 10 mEq/L (ref 3–11)
BILIRUBIN TOTAL: 0.28 mg/dL (ref 0.20–1.20)
BUN: 7.5 mg/dL (ref 7.0–26.0)
CALCIUM: 9.2 mg/dL (ref 8.4–10.4)
CO2: 25 meq/L (ref 22–29)
CREATININE: 0.7 mg/dL (ref 0.6–1.1)
Chloride: 107 mEq/L (ref 98–109)
Glucose: 116 mg/dl (ref 70–140)
Potassium: 3.6 mEq/L (ref 3.5–5.1)
Sodium: 142 mEq/L (ref 136–145)
TOTAL PROTEIN: 6.1 g/dL — AB (ref 6.4–8.3)

## 2017-05-30 MED ORDER — SODIUM CHLORIDE 0.9 % IV SOLN
Freq: Once | INTRAVENOUS | Status: AC
  Administered 2017-05-30: 10:00:00 via INTRAVENOUS

## 2017-05-30 MED ORDER — VANCOMYCIN HCL 1000 MG IV SOLR: 1000.0000 mg | Freq: Once | INTRAVENOUS | Status: DC

## 2017-05-30 MED ORDER — AZITHROMYCIN 250 MG PO TABS: ORAL_TABLET | ORAL | 0 refills | Status: DC

## 2017-05-30 MED ORDER — SODIUM CHLORIDE 0.9% FLUSH
10.0000 mL | INTRAVENOUS | Status: AC | PRN
  Administered 2017-05-30: 10 mL
  Filled 2017-05-30: qty 10

## 2017-05-30 MED ORDER — HEPARIN SOD (PORK) LOCK FLUSH 100 UNIT/ML IV SOLN
250.0000 [IU] | INTRAVENOUS | Status: AC | PRN
  Administered 2017-05-30: 250 [IU]
  Filled 2017-05-30: qty 5

## 2017-05-30 MED ORDER — VANCOMYCIN HCL IN DEXTROSE 1-5 GM/200ML-% IV SOLN
1000.0000 mg | Freq: Once | INTRAVENOUS | Status: AC
  Administered 2017-05-30: 1000 mg via INTRAVENOUS
  Filled 2017-05-30: qty 200

## 2017-05-30 NOTE — Patient Instructions (Signed)
Vancomycin injection What is this medicine? VANCOMYCIN Mckenzie Webb) is a glycopeptide antibiotic. It is used to treat certain kinds of bacterial infections. It will not work for colds, flu, or other viral infections. This medicine may be used for other purposes; ask your health care provider or pharmacist if you have questions. COMMON BRAND NAME(S): Vancocin What should I tell my health care provider before I take this medicine? They need to know if you have any of these conditions: -dehydration -hearing loss -kidney disease -other chronic illness -an unusual or allergic reaction to vancomycin, other medicines, foods, dyes, or preservatives -pregnant or trying to get pregnant -breast-feeding How should I use this medicine? This medicine is infused into a vein. It is usually given by a health care provider in a hospital or clinic. If you receive this medicine at home, you will receive special instructions. Take your medicine at regular intervals. Do not take your medicine more often than directed. Take all of your medicine as directed even if you think you are better. Do not skip doses or stop your medicine early. It is important that you put your used needles and syringes in a special sharps container. Do not put them in a trash can. If you do not have a sharps container, call your pharmacist or healthcare provider to get one. Talk to your pediatrician regarding the use of this medicine in children. While this drug may be prescribed for even very young infants for selected conditions, precautions do apply. Overdosage: If you think you have taken too much of this medicine contact a poison control center or emergency room at once. NOTE: This medicine is only for you. Do not share this medicine with others. What if I miss a dose? If you miss a dose, take it as soon as you can. If it is almost time for your next dose, take only that dose. Do not take double or extra doses. What may interact  with this medicine? -amphotericin B -anesthetics -bacitracin -birth control pills -cisplatin -colistin -diuretics -other aminoglycoside antibiotics -polymyxin B This list may not describe all possible interactions. Give your health care provider a list of all the medicines, herbs, non-prescription drugs, or dietary supplements you use. Also tell them if you smoke, drink alcohol, or use illegal drugs. Some items may interact with your medicine. What should I watch for while using this medicine? Tell your doctor or health care professional if your symptoms do not improve or if you get new symptoms. Your condition and lab work will be monitored while you are taking this medicine. Do not treat diarrhea with over the counter products. Contact your doctor if you have diarrhea that lasts more than 2 days or if it is severe and watery. What side effects may I notice from receiving this medicine? Side effects that you should report to your doctor or health care professional as soon as possible: -allergic reactions like skin rash, itching or hives, swelling of the face, lips, or tongue -breathing difficulty, wheezing -change in amount, color of urine -change in hearing -chest pain -dizziness -fever, chills -flushing of the face and neck (reddening) -low blood pressure -redness, blistering, peeling or loosening of the skin, including inside the mouth -unusual bleeding or bruising -unusually weak or tired Side effects that usually do not require medical attention (report to your doctor or health care professional if they continue or are bothersome): -nausea, vomiting -pain, swelling where injected -stomach cramps This list may not describe all possible side effects.  Call your doctor for medical advice about side effects. You may report side effects to FDA at 1-800-FDA-1088. Where should I keep my medicine? Keep out of the reach of children. You will be instructed on how to store this medicine,  if needed. Throw away any unused medicine after the expiration date on the label. NOTE: This sheet is a summary. It may not cover all possible information. If you have questions about this medicine, talk to your doctor, pharmacist, or health care provider.  2018 Elsevier/Gold Standard (2013-03-12 14:46:02)

## 2017-05-30 NOTE — Progress Notes (Addendum)
[place]  [phone] Fax:([phone]) [phone]     ID: [name] DOB: 17-Sep-1945  MR#: [id number]  TJQ#:[id number]  Patient Care Team: Rita Ohara, MD as PCP - General (Family Medicine) Jovita Kussmaul, MD as Consulting Physician (General Surgery) Magrinat, Virgie Dad, MD as Consulting Physician (Oncology) Eppie Gibson, MD as Attending Physician (Radiation Oncology) Juanita Craver, MD as Consulting Physician (Gastroenterology) Vania Rea, MD as Consulting Physician (Obstetrics and Gynecology) Elsie Saas, MD as Consulting Physician (Orthopedic Surgery) Martinique, Amy, MD as Consulting Physician (Dermatology) Scot Dock, NP OTHER MD:  CHIEF COMPLAINT: Estrogen receptor positive breast cancer  CURRENT TREATMENT: Adjuvant chemotherapy   BREAST CANCER HISTORY: From the original intake note:  "[name]" had bilateral screening mammography with tomography at the [facility] 02/21/2017. This showed a possible mass in the right breast. Right diagnostic mammography with tomography and ultrasonography 02/27/2017 found a breast density to be category B. In the subareolar right breast there was a 0.5 cm spiculated mass, which was not palpable. There was mild right nipple inversion, which per report was chronic. Ultrasonography confirmed a 0.5 cm retroareolar breast mass at the 1:00 radiant. The right axilla was sonographically benign.  On 03/03/2017 the patient underwent biopsy of the right breast mass in question. This showed ([id number]) and invasive ductal carcinoma, grade 1, estrogen receptor 100% positive, progesterone receptor negative, with an MIB-1 of 3%, and no HER-2 implication, the signals ratio being 1.25 and the number per cell 2.00.  The patient's subsequent history is as detailed below.  INTERVAL HISTORY: [name] is here today for follow up of her breast cellulitis.  Her breast is less erythematous today.  She continues to deny fevers, chills,  nausea, vomiting, constipation, diarrhea, or any other concerns.    REVIEW OF SYSTEMS: A detailed ROS was non contributory.     PAST MEDICAL HISTORY: Past Medical History:  Diagnosis Date  . Allergy   . DJD (degenerative joint disease), cervical 04 2002  . Edema    resolved  . Family history of colon cancer   . Family history of pancreatic cancer   . GERD (gastroesophageal reflux disease)   . Hyperlipidemia   . Hypothyroid   . Interstitial cystitis   . Leukopenia   . PONV (postoperative nausea and vomiting)   . Psoriasis     PAST SURGICAL HISTORY: Past Surgical History:  Procedure Laterality Date  . BREAST LUMPECTOMY WITH RADIOACTIVE SEED AND SENTINEL LYMPH NODE BIOPSY Right 03/26/2017   Procedure: BREAST LUMPECTOMY WITH RADIOACTIVE SEED AND SENTINEL LYMPH NODE BIOPSY;  Surgeon: Jovita Kussmaul, MD;  Location: [facility];  Service: General;  Laterality: Right;  . CHOLECYSTECTOMY    . COLONOSCOPY  3/09  . KNEE ARTHROSCOPY Right 1990  . KNEE ARTHROSCOPY Left 12/12/2010    (Dr. Noemi Chapel)  . PORTACATH PLACEMENT Left 04/24/2017   Procedure: INSERTION PORT-A-CATH;  Surgeon: Jovita Kussmaul, MD;  Location: [facility];  Service: General;  Laterality: Left;  . TUBAL LIGATION  1983  . VARICOSE VEIN SURGERY  09/2009 R, 06/2010 L    FAMILY HISTORY Family History  Problem Relation Age of Onset  . Hypertension Mother   . Heart disease Mother   . Gallbladder disease Mother   . Thyroid disease Mother   . Arthritis Mother   . Diabetes Father   . Cancer Father        pancreatic  . Hypertension Sister   . Gallbladder disease Sister   . Diabetes Sister   .  Diverticulitis Sister        of small intestine  . Cancer Son        testicular cancer  . Stroke Sister        late 34's  . Hypertension Sister   . Pulmonary embolism Sister   . Ulcerative colitis Sister   . COPD Brother        had lung lobe removed for suspected cancer, but was benign; smoker  . Colon cancer Other 40    . Lung cancer Cousin        maternal first cousin - smoker  . Breast cancer Neg Hx   The patient's father died from pancreatic cancer at the age of 16. The patient's mother died from a myocardial infarction at age 83. The patient had one brother, 3 sisters. The patient's older son was diagnosed with testicular cancer at age 43. A maternal niece was diagnosed with colon cancer at age 81 and a maternal cousin with lung cancer at an unknown age.  GYNECOLOGIC HISTORY:  No LMP recorded. Patient is postmenopausal.  Menarche age 6, first live birth age 23, the patient is [place] P2. she went through the change of life approximately the year 2000. She did not take hormone replacement. She did take oral contraceptives for more than 20 years remotely, with no complications.  SOCIAL HISTORY:  She is a retired Web designer. Her husband Arnette Norris is an Clinical biochemist, still working part-time. Son Corene Cornea (from the patient's first marriage) lives in [place] and works in Therapist, art. Son Catalina Antigua (from patient second marriage) lives in [place] we are is Comptroller. The patient has one grandchild. She is not a Ambulance person.   ADVANCED DIRECTIVES: Not in place   HEALTH MAINTENANCE: Social History  Substance Use Topics  . Smoking status: Never Smoker  . Smokeless tobacco: Never Used  . Alcohol use No     Comment: social, 1-2 times per year     Colonoscopy:2009/[facility] and  PAP:  [facility], T score -0.1 (normal)   Allergies  Allergen Reactions  . Iodine Swelling  . Sulfa Antibiotics Swelling    Lips swell, itching  . Penicillins Rash    Has patient had a PCN reaction causing immediate rash, facial/tongue/throat swelling, SOB or lightheadedness with hypotension: No Has patient had a PCN reaction causing severe rash involving mucus membranes or skin necrosis: No Has patient had a PCN reaction that required hospitalization: No Has patient had a PCN reaction  occurring within the last 10 years: No If all of the above answers are "NO", then may proceed with Cephalosporin use.   Newell Coral [Bacitracin-Polymyxin B] Swelling    Used for eye infection--got redder and worse  . Ciprofloxacin     After she takes for a while it gives her a "funny feeling."  . Codeine Nausea Only    Current Outpatient Prescriptions  Medication Sig Dispense Refill  . anastrozole (ARIMIDEX) 1 MG tablet Take 1 tablet (1 mg total) by mouth daily. 90 tablet 4  . azithromycin (ZITHROMAX) 250 MG tablet 2 tabs on day 1, then 1 tab daily until complete 6 each 0  . Calcium Carbonate-Vitamin D (CALCIUM 600+D) 600-400 MG-UNIT tablet Take 1 tablet by mouth daily.    . Cholecalciferol (VITAMIN D) 1000 UNITS capsule Take 1,000 Units by mouth daily.      . clindamycin (CLINDAGEL) 1 % gel Apply topically 2 (two) times daily. 30 g 0  . Coenzyme Q10 (COQ10)  100 MG CAPS Take 100 mg by mouth daily.    Marland Kitchen dexamethasone (DECADRON) 4 MG tablet Take 2 tablets by mouth once a day on the day after chemotherapy and then take 2 tablets two times a day for 2 days. Take with food. 30 tablet 1  . diclofenac (VOLTAREN) 75 MG EC tablet Take 75 mg by mouth 2 (two) times daily.    Marland Kitchen levothyroxine (SYNTHROID, LEVOTHROID) 88 MCG tablet Take 1 tablet (88 mcg total) by mouth daily. (Patient taking differently: Take 88 mcg by mouth daily before breakfast. 30 minutes before eating anything) 90 tablet 3  . lidocaine-prilocaine (EMLA) cream Apply to affected area once 30 g 3  . loratadine (CLARITIN) 10 MG tablet Take 10 mg by mouth daily as needed for allergies.     Marland Kitchen LORazepam (ATIVAN) 0.5 MG tablet Take 0.5 mg by mouth at bedtime as needed.  0  . LORazepam (ATIVAN) 0.5 MG tablet Take 1 tablet (0.5 mg total) by mouth every 6 (six) hours as needed (Nausea or vomiting). 30 tablet 0  . Multiple Vitamins-Minerals (ALIVE WOMENS 50+ PO) Take 1 tablet by mouth daily.     . Omega-3 Fatty Acids (FISH OIL) 1200 MG CAPS  Take 2,400 mg by mouth at bedtime.    Marland Kitchen omeprazole (PRILOSEC) 40 MG capsule TAKE ONE (1) CAPSULE EACH DAY 90 capsule 2  . ondansetron (ZOFRAN) 8 MG tablet Take 1 tablet (8 mg total) by mouth 2 (two) times daily as needed. Start on the third day after chemotherapy. 30 tablet 1  . prochlorperazine (COMPAZINE) 10 MG tablet Take 1 tablet (10 mg total) by mouth every 6 (six) hours as needed (Nausea or vomiting). 90 tablet 1  . Red Yeast Rice Extract (RED YEAST RICE PO) Take 1,200 mg by mouth 2 (two) times daily.     Marland Kitchen venlafaxine XR (EFFEXOR-XR) 75 MG 24 hr capsule Take 1 capsule (75 mg total) by mouth daily with breakfast. 90 capsule 1  . vitamin B-12 (CYANOCOBALAMIN) 1000 MCG tablet Take 1,000 mcg by mouth daily.     No current facility-administered medications for this visit.     OBJECTIVE:  Vitals:   05/30/17 0921  BP: (!) 157/74  Pulse: 74  Resp: 20  Temp: 98.1 F (36.7 C)  SpO2: 97%     Body mass index is 29.76 kg/m.   Wt Readings from Last 3 Encounters:  05/30/17 173 lb 6.4 oz (78.7 kg)  05/28/17 173 lb 14.4 oz (78.9 kg)  05/26/17 171 lb 11.2 oz (77.9 kg)  ECOG FS:0 - Asymptomatic GENERAL: Patient is a well appearing female in no acute distress HEENT:  Sclerae anicteric.  Oropharynx clear and moist. No ulcerations or evidence of oropharyngeal candidiasis. Neck is supple.  NODES:  No cervical, supraclavicular, or axillary lymphadenopathy palpated.  BREAST EXAM:  Right breast erythema is improved, still present however LUNGS:  Clear to auscultation bilaterally.  No wheezes or rhonchi. HEART:  Regular rate and rhythm. No murmur appreciated. ABDOMEN:  Soft, nontender.  Positive, normoactive bowel sounds. No organomegaly palpated. MSK:  No focal spinal tenderness to palpation. Full range of motion bilaterally in the upper extremities. EXTREMITIES:  No peripheral edema.   SKIN:  Clear with no obvious rashes or skin changes. No nail dyscrasia. NEURO:  Nonfocal. Well oriented.   Appropriate affect.    LAB RESULTS:  CMP     Component Value Date/Time   NA 142 05/30/2017 0832   K 3.6 05/30/2017 0832   CL  100 (L) 05/02/2017 2301   CO2 25 05/30/2017 0832   GLUCOSE 116 05/30/2017 0832   BUN 7.5 05/30/2017 0832   CREATININE 0.7 05/30/2017 0832   CALCIUM 9.2 05/30/2017 0832   PROT 6.1 (L) 05/30/2017 0832   ALBUMIN 3.2 (L) 05/30/2017 0832   AST 15 05/30/2017 0832   ALT 26 05/30/2017 0832   ALKPHOS 63 05/30/2017 0832   BILITOT 0.28 05/30/2017 0832   GFRNONAA >60 05/02/2017 2301   GFRAA >60 05/02/2017 2301    No results found for: TOTALPROTELP, ALBUMINELP, A1GS, A2GS, BETS, BETA2SER, GAMS, MSPIKE, SPEI  No results found for: KPAFRELGTCHN, LAMBDASER, KAPLAMBRATIO  Lab Results  Component Value Date   WBC 3.3 (L) 05/30/2017   NEUTROABS 2.5 05/30/2017   HGB 9.9 (L) 05/30/2017   HCT 29.5 (L) 05/30/2017   MCV 93.7 05/30/2017   PLT 86 (L) 05/30/2017      Chemistry      Component Value Date/Time   NA 142 05/30/2017 0832   K 3.6 05/30/2017 0832   CL 100 (L) 05/02/2017 2301   CO2 25 05/30/2017 0832   BUN 7.5 05/30/2017 0832   CREATININE 0.7 05/30/2017 0832      Component Value Date/Time   CALCIUM 9.2 05/30/2017 0832   ALKPHOS 63 05/30/2017 0832   AST 15 05/30/2017 0832   ALT 26 05/30/2017 0832   BILITOT 0.28 05/30/2017 0832       No results found for: LABCA2  No components found for: [id number]  No results for input(s): INR in the last 168 hours.  Urinalysis    Component Value Date/Time   COLORURINE YELLOW 05/02/2017 2201   APPEARANCEUR HAZY (A) 05/02/2017 2201   LABSPEC 1.014 05/02/2017 2201   PHURINE 6.0 05/02/2017 2201   GLUCOSEU NEGATIVE 05/02/2017 2201   HGBUR SMALL (A) 05/02/2017 2201   BILIRUBINUR NEGATIVE 05/02/2017 2201   BILIRUBINUR neg 08/17/2014 1123   KETONESUR NEGATIVE 05/02/2017 2201   PROTEINUR NEGATIVE 05/02/2017 2201   UROBILINOGEN negative 08/17/2014 1123   NITRITE POSITIVE (A) 05/02/2017 2201   LEUKOCYTESUR  LARGE (A) 05/02/2017 2201     STUDIES: Dg Chest 2 View  Result Date: 05/02/2017 CLINICAL DATA:  Initial evaluation for acute weakness, cough. EXAM: CHEST  2 VIEW COMPARISON:  Prior radiograph from 04/24/2017. FINDINGS: Left-sided Port-A-Cath. Cardiac and mediastinal silhouettes are stable, and remain within normal limits. Lungs mildly hypoinflated. Mild atelectatic changes at the left lung base. No focal infiltrates. No pulmonary edema or pleural effusion. No pneumothorax. No acute osseus abnormality. IMPRESSION: 1. Mild left basilar atelectasis. 2. No other active cardiopulmonary disease. Electronically Signed   By: Jeannine Boga M.D.   On: 05/02/2017 23:10   Ct Head Wo Contrast  Result Date: 05/03/2017 CLINICAL DATA:  Acute headache. Normal neurologic exam. Recently diagnosed with breast cancer. EXAM: CT HEAD WITHOUT CONTRAST TECHNIQUE: Contiguous axial images were obtained from the base of the skull through the vertex without intravenous contrast. COMPARISON:  None. FINDINGS: Brain: No intracranial hemorrhage, mass effect, or midline shift. No evidence of intracranial mass on noncontrast CT. No hydrocephalus. The basilar cisterns are patent. No evidence of territorial infarct or acute ischemia. No extra-axial or intracranial fluid collection. Vascular: No hyperdense vessel. Skull: No focal lesion.  No skull fracture. Sinuses/Orbits: Paranasal sinuses and mastoid air cells are clear. The visualized orbits are unremarkable. Other: None. IMPRESSION: 1.  No acute intracranial abnormality. 2. No evidence of intracranial metastatic disease or mass on noncontrast head CT. If there is persistent clinical concern, recommend brain  MRI, preferably with contrast. Electronically Signed   By: Jeb Levering M.D.   On: 05/03/2017 02:31    ELIGIBLE FOR AVAILABLE RESEARCH PROTOCOL: no  ASSESSMENT: 71 y.o. [place] woman status post right breast upper inner quadrant biopsy 03/04/2015 for a clinical T1a  N0, stage IA invasive ductal carcinoma, grade 1, estrogen receptor positive, progesterone receptor negative, with an MIB-1 of 3% and no HER-2 amplification  (1) anastrozole started neoadjuvantly 03/12/2017 in anticipation of possible surgical delays   (2) genetics testingNegative genetic testing on the common hereditary cancer panel.  The Hereditary Gene Panel offered by [facility] includes sequencing and/or deletion duplication testing of the following 46 genes: APC, ATM, AXIN2, BARD1, BMPR1A, BRCA1, BRCA2, BRIP1, CDH1, CDKN2A (p14ARF), CDKN2A (p16INK4a), CHEK2, CTNNA1, DICER1, EPCAM (Deletion/duplication testing only), GREM1 (promoter region deletion/duplication testing only), KIT, MEN1, MLH1, MSH2, MSH3, MSH6, MUTYH, NBN, NF1, NHTL1, PALB2, PDGFRA, PMS2, POLD1, POLE, PTEN, RAD50, RAD51C, RAD51D, SDHB, SDHC, SDHD, SMAD4, SMARCA4. STK11, TP53, TSC1, TSC2, and VHL.  The following genes were evaluated for sequence changes only: SDHA and HOXB13 c.251G>A variant only.  The report date is April 12, 2017.  (3) right lumpectomy and sentinel lymph node biopsy 03/26/2017 showed a pT1c pN1, stage IB invasive ductal carcinoma, grade 1, with negative margins  (4) Mammaprint returned high risk, indicating need for adjuvant chemotherapy  (5) chemotherapy will consist of Cytoxan and docetaxel given every 3 weeks 4 beginning on 04/28/2017  (a) changed to Doxorubicin and Cyclophosphamide beginning 05/19/2017, to complete 3 cycles  (b) dose reduced by 20% with the second and third CA treatments  (6) adjuvant radiation to follow   (7) continue anti-estrogens to a total of 5 years.  PLAN:  [name] is doing better today.  She will get IV Vancomycin today and again tomorrow.  I will also call in a zpak for her to start taking. I spoke with pharmacy and worked out the details for her to get antibiotics tomorrow.  They will have them ready for her.  She also met with Dr. [name] today and they discussed doing her  Doxorubicin/Cyclophosphamide every 3 weeks instead of every 2 weeks. I sent the appropriate scheduling messages.  She will return tomorrow for IV antibiotics and on 10/22 for labs, follow up, and her second cycle of Doxorubicin/Cylcophosphamide.    [name] knows to call for any questions or concerns prior to her next appointment.    Scot Dock, NP   05/30/2017 3:46 PM Medical Oncology and Hematology [facility] [address] [place], [place] [address] Tel. [phone]    Fax. [phone]   ADDENDUM: [name] is tolerating the vancomycin well. It appears to be working, with improved erythema. We are going to go ahead and add an additional dose, on Saturday, and also add a Z-Pak. I think this will take care of the problem especially as her neutrophils are now recovering.  I have dropped her chemotherapy dose by 20% with subsequent cycles, which I think will prevent similar episodes.  She knows to call for any other issues that may develop before the next visit.  I personally saw this patient and performed a substantive portion of this encounter with the listed APP documented above.   Chauncey Cruel, MD Medical Oncology and Hematology [facility] [address] [place], [place] [address] Tel. [phone]    Fax. [phone]

## 2017-05-31 ENCOUNTER — Ambulatory Visit (HOSPITAL_BASED_OUTPATIENT_CLINIC_OR_DEPARTMENT_OTHER): Payer: Medicare Other

## 2017-05-31 DIAGNOSIS — L0390 Cellulitis, unspecified: Secondary | ICD-10-CM

## 2017-05-31 MED ORDER — SODIUM CHLORIDE 0.9% FLUSH
10.0000 mL | INTRAVENOUS | Status: DC | PRN
  Administered 2017-05-31: 10 mL via INTRAVENOUS
  Filled 2017-05-31: qty 10

## 2017-05-31 MED ORDER — SODIUM CHLORIDE 0.9 % IV SOLN
INTRAVENOUS | Status: AC
  Administered 2017-05-31: 09:00:00 via INTRAVENOUS

## 2017-05-31 MED ORDER — VANCOMYCIN HCL 1000 MG IV SOLR
1000.0000 mg | Freq: Once | INTRAVENOUS | Status: AC
  Administered 2017-05-31: 1000 mg via INTRAVENOUS

## 2017-05-31 MED ORDER — HEPARIN SOD (PORK) LOCK FLUSH 100 UNIT/ML IV SOLN
500.0000 [IU] | Freq: Once | INTRAVENOUS | Status: AC
  Administered 2017-05-31: 500 [IU] via INTRAVENOUS
  Filled 2017-05-31: qty 5

## 2017-05-31 NOTE — Patient Instructions (Signed)
Dehydration, Adult Dehydration is a condition in which there is not enough fluid or water in the body. This happens when you lose more fluids than you take in. Important organs, such as the kidneys, brain, and heart, cannot function without a proper amount of fluids. Any loss of fluids from the body can lead to dehydration. Dehydration can range from mild to severe. This condition should be treated right away to prevent it from becoming severe. What are the causes? This condition may be caused by:  Vomiting.  Diarrhea.  Excessive sweating, such as from heat exposure or exercise.  Not drinking enough fluid, especially: ? When ill. ? While doing activity that requires a lot of energy.  Excessive urination.  Fever.  Infection.  Certain medicines, such as medicines that cause the body to lose excess fluid (diuretics).  Inability to access safe drinking water.  Reduced physical ability to get adequate water and food.  What increases the risk? This condition is more likely to develop in people:  Who have a poorly controlled long-term (chronic) illness, such as diabetes, heart disease, or kidney disease.  Who are age 35 or older.  Who are disabled.  Who live in a place with high altitude.  Who play endurance sports.  What are the signs or symptoms? Symptoms of mild dehydration may include:  Thirst.  Dry lips.  Slightly dry mouth.  Dry, warm skin.  Dizziness. Symptoms of moderate dehydration may include:  Very dry mouth.  Muscle cramps.  Dark urine. Urine may be the color of tea.  Decreased urine production.  Decreased tear production.  Heartbeat that is irregular or faster than normal (palpitations).  Headache.  Light-headedness, especially when you stand up from a sitting position.  Fainting (syncope). Symptoms of severe dehydration may include:  Changes in skin, such as: ? Cold and clammy skin. ? Blotchy (mottled) or pale skin. ? Skin that does  not quickly return to normal after being lightly pinched and released (poor skin turgor).  Changes in body fluids, such as: ? Extreme thirst. ? No tear production. ? Inability to sweat when body temperature is high, such as in hot weather. ? Very little urine production.  Changes in vital signs, such as: ? Weak pulse. ? Pulse that is more than 100 beats a minute when sitting still. ? Rapid breathing. ? Low blood pressure.  Other changes, such as: ? Sunken eyes. ? Cold hands and feet. ? Confusion. ? Lack of energy (lethargy). ? Difficulty waking up from sleep. ? Short-term weight loss. ? Unconsciousness. How is this diagnosed? This condition is diagnosed based on your symptoms and a physical exam. Blood and urine tests may be done to help confirm the diagnosis. How is this treated? Treatment for this condition depends on the severity. Mild or moderate dehydration can often be treated at home. Treatment should be started right away. Do not wait until dehydration becomes severe. Severe dehydration is an emergency and it needs to be treated in a hospital. Treatment for mild dehydration may include:  Drinking more fluids.  Replacing salts and minerals in your blood (electrolytes) that you may have lost. Treatment for moderate dehydration may include:  Drinking an oral rehydration solution (ORS). This is a drink that helps you replace fluids and electrolytes (rehydrate). It can be found at pharmacies and retail stores. Treatment for severe dehydration may include:  Receiving fluids through an IV tube.  Receiving an electrolyte solution through a feeding tube that is passed through your nose  and into your stomach (nasogastric tube, or NG tube).  Correcting any abnormalities in electrolytes.  Treating the underlying cause of dehydration. Follow these instructions at home:  If directed by your health care provider, drink an ORS: ? Make an ORS by following instructions on the  package. ? Start by drinking small amounts, about  cup (120 mL) every 5-10 minutes. ? Slowly increase how much you drink until you have taken the amount recommended by your health care provider.  Drink enough clear fluid to keep your urine clear or pale yellow. If you were told to drink an ORS, finish the ORS first, then start slowly drinking other clear fluids. Drink fluids such as: ? Water. Do not drink only water. Doing that can lead to having too little salt (sodium) in the body (hyponatremia). ? Ice chips. ? Fruit juice that you have added water to (diluted fruit juice). ? Low-calorie sports drinks.  Avoid: ? Alcohol. ? Drinks that contain a lot of sugar. These include high-calorie sports drinks, fruit juice that is not diluted, and soda. ? Caffeine. ? Foods that are greasy or contain a lot of fat or sugar.  Take over-the-counter and prescription medicines only as told by your health care provider.  Do not take sodium tablets. This can lead to having too much sodium in the body (hypernatremia).  Eat foods that contain a healthy balance of electrolytes, such as bananas, oranges, potatoes, tomatoes, and spinach.  Keep all follow-up visits as told by your health care provider. This is important. Contact a health care provider if:  You have abdominal pain that: ? Gets worse. ? Stays in one area (localizes).  You have a rash.  You have a stiff neck.  You are more irritable than usual.  You are sleepier or more difficult to wake up than usual.  You feel weak or dizzy.  You feel very thirsty.  You have urinated only a small amount of very dark urine over 6-8 hours. Get help right away if:  You have symptoms of severe dehydration.  You cannot drink fluids without vomiting.  Your symptoms get worse with treatment.  You have a fever.  You have a severe headache.  You have vomiting or diarrhea that: ? Gets worse. ? Does not go away.  You have blood or green matter  (bile) in your vomit.  You have blood in your stool. This may cause stool to look black and tarry.  You have not urinated in 6-8 hours.  You faint.  Your heart rate while sitting still is over 100 beats a minute.  You have trouble breathing. This information is not intended to replace advice given to you by your health care provider. Make sure you discuss any questions you have with your health care provider. Document Released: 08/05/2005 Document Revised: 03/01/2016 Document Reviewed: 09/29/2015 Elsevier Interactive Patient Education  2018 [employer].  Vancomycin injection What is this medicine? VANCOMYCIN Lucianne Lei koe MYE sin) is a glycopeptide antibiotic. It is used to treat certain kinds of bacterial infections. It will not work for colds, flu, or other viral infections. This medicine may be used for other purposes; ask your health care provider or pharmacist if you have questions. COMMON BRAND NAME(S): Vancocin What should I tell my health care provider before I take this medicine? They need to know if you have any of these conditions: -dehydration -hearing loss -kidney disease -other chronic illness -an unusual or allergic reaction to vancomycin, other medicines, foods, dyes, or preservatives -  pregnant or trying to get pregnant -breast-feeding How should I use this medicine? This medicine is infused into a vein. It is usually given by a health care provider in a hospital or clinic. If you receive this medicine at home, you will receive special instructions. Take your medicine at regular intervals. Do not take your medicine more often than directed. Take all of your medicine as directed even if you think you are better. Do not skip doses or stop your medicine early. It is important that you put your used needles and syringes in a special sharps container. Do not put them in a trash can. If you do not have a sharps container, call your pharmacist or healthcare provider to get  one. Talk to your pediatrician regarding the use of this medicine in children. While this drug may be prescribed for even very [place] infants for selected conditions, precautions do apply. Overdosage: If you think you have taken too much of this medicine contact a poison control center or emergency room at once. NOTE: This medicine is only for you. Do not share this medicine with others. What if I miss a dose? If you miss a dose, take it as soon as you can. If it is almost time for your next dose, take only that dose. Do not take double or extra doses. What may interact with this medicine? -amphotericin B -anesthetics -bacitracin -birth control pills -cisplatin -colistin -diuretics -other aminoglycoside antibiotics -polymyxin B This list may not describe all possible interactions. Give your health care provider a list of all the medicines, herbs, non-prescription drugs, or dietary supplements you use. Also tell them if you smoke, drink alcohol, or use illegal drugs. Some items may interact with your medicine. What should I watch for while using this medicine? Tell your doctor or health care professional if your symptoms do not improve or if you get new symptoms. Your condition and lab work will be monitored while you are taking this medicine. Do not treat diarrhea with over the counter products. Contact your doctor if you have diarrhea that lasts more than 2 days or if it is severe and watery. What side effects may I notice from receiving this medicine? Side effects that you should report to your doctor or health care professional as soon as possible: -allergic reactions like skin rash, itching or hives, swelling of the face, lips, or tongue -breathing difficulty, wheezing -change in amount, color of urine -change in hearing -chest pain -dizziness -fever, chills -flushing of the face and neck (reddening) -low blood pressure -redness, blistering, peeling or loosening of the skin,  including inside the mouth -unusual bleeding or bruising -unusually weak or tired Side effects that usually do not require medical attention (report to your doctor or health care professional if they continue or are bothersome): -nausea, vomiting -pain, swelling where injected -stomach cramps This list may not describe all possible side effects. Call your doctor for medical advice about side effects. You may report side effects to FDA at 1-800-FDA-1088. Where should I keep my medicine? Keep out of the reach of children. You will be instructed on how to store this medicine, if needed. Throw away any unused medicine after the expiration date on the label. NOTE: This sheet is a summary. It may not cover all possible information. If you have questions about this medicine, talk to your doctor, pharmacist, or health care provider.  2018 Elsevier/[employer] Standard (2013-03-12 14:46:02)

## 2017-06-02 ENCOUNTER — Other Ambulatory Visit: Payer: Medicare Other

## 2017-06-02 ENCOUNTER — Ambulatory Visit: Payer: Medicare Other

## 2017-06-02 ENCOUNTER — Ambulatory Visit: Payer: Medicare Other | Admitting: Adult Health

## 2017-06-08 NOTE — Progress Notes (Signed)
[place]  [phone] [phone]     ID: Mckenzie Webb DOB: [birth year]  MR#: [id number]  JYN#:[id number]  Patient Care Team: Rita Ohara, MD as PCP - General (Family Medicine) Jovita Kussmaul, MD as Consulting Physician (General Surgery) Magrinat, Virgie Dad, MD as Consulting Physician (Oncology) Eppie Gibson, MD as Attending Physician (Radiation Oncology) Juanita Craver, MD as Consulting Physician (Gastroenterology) Vania Rea, MD as Consulting Physician (Obstetrics and Gynecology) Elsie Saas, MD as Consulting Physician (Orthopedic Surgery) Martinique, Amy, MD as Consulting Physician (Dermatology) OTHER MD:  CHIEF COMPLAINT: Estrogen receptor positive breast cancer  CURRENT TREATMENT: Adjuvant chemotherapy   BREAST CANCER HISTORY: From the original intake note:  "Mckenzie Webb" had bilateral screening mammography with tomography at the [facility] 02/21/2017. This showed a possible mass in the right breast. Right diagnostic mammography with tomography and ultrasonography 02/27/2017 found a breast density to be category B. In the subareolar right breast there was a 0.5 cm spiculated mass, which was not palpable. There was mild right nipple inversion, which per report was chronic. Ultrasonography confirmed a 0.5 cm retroareolar breast mass at the 1:00 radiant. The right axilla was sonographically benign.  On 03/03/2017 the patient underwent biopsy of the right breast mass in question. This showed (SAA [id number]) and invasive ductal carcinoma, grade 1, estrogen receptor 100% positive, progesterone receptor negative, with an MIB-1 of 3%, and no HER-2 implication, the signals ratio being 1.25 and the number per cell 2.00.  The patient's subsequent history is as detailed below.  INTERVAL HISTORY: Mckenzie Webb returns today for follow-up and treatment of her estrogen receptor positive breast cancer, currently being treated adjuvantly with cyclophosphamide and  doxorubicin. After the first dose the patient had a febrile neutropenic episode and the dose was decreased. Also the dosing interval was increased so that she is now being treated every 3 weeks instead of every 2 weeks. Today is day 1 cycle 2 of 3 planned cycles of treatment (recall the patient had an initial cycle ofcyclophosphamide and docetaxel which she did not tolerate well) She is accompanied by her husband. She notes that she is doing well overall with the new treatment method.    REVIEW OF SYSTEMS: Mckenzie Webb reports that she was able to clean her home this weekend. She was able to go to [place] and purchase groceries to make a vegetable soup. The longest period that she was on her feet was a couple of hours without issues. She is fatigued, which she notes is her "new normal." She doesn't nap during the day. She reports watery bilateral eyes. She uses a stool softener that keeps her bowel movements regular. She denies unusual headaches, visual changes, nausea, vomiting, or dizziness. There has been no unusual cough, phlegm production, or pleurisy. This been no change in bowel or bladder habits. She denies unexplained fatigue or unexplained weight loss, bleeding, rash, or fever. A detailed review of systems was otherwise stable.      PAST MEDICAL HISTORY: Past Medical History:  Diagnosis Date   Allergy    DJD (degenerative joint disease), cervical 04 2002   Edema    resolved   Family history of colon cancer    Family history of pancreatic cancer    GERD (gastroesophageal reflux disease)    Hyperlipidemia    Hypothyroid    Interstitial cystitis    Leukopenia    PONV (postoperative nausea and vomiting)    Psoriasis     PAST SURGICAL HISTORY: Past Surgical History:  Procedure Laterality Date  BREAST LUMPECTOMY WITH RADIOACTIVE SEED AND SENTINEL LYMPH NODE BIOPSY Right 03/26/2017   Procedure: BREAST LUMPECTOMY WITH RADIOACTIVE SEED AND SENTINEL LYMPH NODE BIOPSY;  Surgeon:  Jovita Kussmaul, MD;  Location: [facility];  Service: General;  Laterality: Right;   CHOLECYSTECTOMY     COLONOSCOPY  3/09   KNEE ARTHROSCOPY Right 1990   KNEE ARTHROSCOPY Left 12/12/2010    (Dr. Noemi Chapel)   [place] Left 04/24/2017   Procedure: INSERTION PORT-A-CATH;  Surgeon: Jovita Kussmaul, MD;  Location: [facility];  Service: General;  Laterality: Left;   [place]  09/2009 R, 06/2010 L    FAMILY HISTORY Family History  Problem Relation Age of Onset   Hypertension Mother    Heart disease Mother    Gallbladder disease Mother    Thyroid disease Mother    Arthritis Mother    Diabetes Father    Cancer Father        pancreatic   Hypertension Sister    Gallbladder disease Sister    Diabetes Sister    Diverticulitis Sister        of small intestine   Cancer Son        testicular cancer   Stroke Sister        late 50's   Hypertension Sister    Pulmonary embolism Sister    Ulcerative colitis Sister    COPD Brother        had lung lobe removed for suspected cancer, but was benign; smoker   Colon cancer Other 68   Lung cancer Cousin        maternal first cousin - smoker   Breast cancer Neg Hx   The patient's father died from pancreatic cancer at the age of 51. The patient's mother died from a myocardial infarction at age 48. The patient had one brother, 3 sisters. The patient's older son was diagnosed with testicular cancer at age 7. A maternal niece was diagnosed with colon cancer at age 13 and a maternal cousin with lung cancer at an unknown age.  GYNECOLOGIC HISTORY:  No LMP recorded. Patient is postmenopausal.  Menarche age 23, first live birth age 31, the patient is [place] P2. she went through the change of life approximately the year 2000. She did not take hormone replacement. She did take oral contraceptives for more than 20 years remotely, with no complications.  SOCIAL HISTORY:  She is a  retired Web designer. Her husband Mckenzie Webb is an Clinical biochemist, still working part-time. Son Mckenzie Webb (from the patient's first marriage) lives in [place] and works in Therapist, art. Son Mckenzie Webb (from patient second marriage) lives in [place] we are is Comptroller. The patient has one grandchild. She is not a Ambulance person.   ADVANCED DIRECTIVES: Not in place   HEALTH MAINTENANCE: Social History  Substance Use Topics   Smoking status: Never Smoker   Smokeless tobacco: Never Used   Alcohol use No     Comment: social, 1-2 times per year     Colonoscopy:2009/[facility] and  PAP:  [facility], T score -0.1 (normal)   Allergies  Allergen Reactions   Iodine Swelling   Sulfa Antibiotics Swelling    Lips swell, itching   Penicillins Rash    Has patient had a PCN reaction causing immediate rash, facial/tongue/throat swelling, SOB or lightheadedness with hypotension: No Has patient had a PCN reaction causing severe rash involving mucus membranes or skin  necrosis: No Has patient had a PCN reaction that required hospitalization: No Has patient had a PCN reaction occurring within the last 10 years: No If all of the above answers are "NO", then may proceed with Cephalosporin use.    Polysporin [Bacitracin-Polymyxin B] Swelling    Used for eye infection--got redder and worse   Ciprofloxacin     After she takes for a while it gives her a "funny feeling."   Codeine Nausea Only    Current Outpatient Prescriptions  Medication Sig Dispense Refill   anastrozole (ARIMIDEX) 1 MG tablet Take 1 tablet (1 mg total) by mouth daily. 90 tablet 4   azithromycin (ZITHROMAX) 250 MG tablet 2 tabs on day 1, then 1 tab daily until complete 6 each 0   Calcium Carbonate-Vitamin D (CALCIUM 600+D) 600-400 MG-UNIT tablet Take 1 tablet by mouth daily.     Cholecalciferol (VITAMIN D) 1000 UNITS capsule Take 1,000 Units by mouth daily.       clindamycin  (CLINDAGEL) 1 % gel Apply topically 2 (two) times daily. 30 g 0   Coenzyme Q10 (COQ10) 100 MG CAPS Take 100 mg by mouth daily.     dexamethasone (DECADRON) 4 MG tablet Take 2 tablets by mouth once a day on the day after chemotherapy and then take 2 tablets two times a day for 2 days. Take with food. 30 tablet 1   diclofenac (VOLTAREN) 75 MG EC tablet Take 75 mg by mouth 2 (two) times daily.     levothyroxine (SYNTHROID, LEVOTHROID) 88 MCG tablet Take 1 tablet (88 mcg total) by mouth daily. (Patient taking differently: Take 88 mcg by mouth daily before breakfast. 30 minutes before eating anything) 90 tablet 3   lidocaine-prilocaine (EMLA) cream Apply to affected area once 30 g 3   loratadine (CLARITIN) 10 MG tablet Take 10 mg by mouth daily as needed for allergies.      LORazepam (ATIVAN) 0.5 MG tablet Take 0.5 mg by mouth at bedtime as needed.  0   LORazepam (ATIVAN) 0.5 MG tablet Take 1 tablet (0.5 mg total) by mouth every 6 (six) hours as needed (Nausea or vomiting). 30 tablet 0   Multiple Vitamins-Minerals (ALIVE WOMENS 50+ PO) Take 1 tablet by mouth daily.      Omega-3 Fatty Acids (FISH OIL) 1200 MG CAPS Take 2,400 mg by mouth at bedtime.     omeprazole (PRILOSEC) 40 MG capsule TAKE ONE (1) CAPSULE EACH DAY 90 capsule 2   ondansetron (ZOFRAN) 8 MG tablet Take 1 tablet (8 mg total) by mouth 2 (two) times daily as needed. Start on the third day after chemotherapy. 30 tablet 1   prochlorperazine (COMPAZINE) 10 MG tablet Take 1 tablet (10 mg total) by mouth every 6 (six) hours as needed (Nausea or vomiting). 90 tablet 1   Red Yeast Rice Extract (RED YEAST RICE PO) Take 1,200 mg by mouth 2 (two) times daily.      venlafaxine XR (EFFEXOR-XR) 75 MG 24 hr capsule Take 1 capsule (75 mg total) by mouth daily with breakfast. 90 capsule 1   vitamin B-12 (CYANOCOBALAMIN) 1000 MCG tablet Take 1,000 mcg by mouth daily.     No current facility-administered medications for this visit.      OBJECTIVE: middle-aged white woman in no acute distress  Vitals:   06/09/17 1319  BP: (!) 174/74  Pulse: 82  Resp: 18  Temp: 98.4 F (36.9 C)  SpO2: 99%     Body mass index is 29.87 kg/m.  Wt Readings from Last 3 Encounters:  06/09/17 174 lb (78.9 kg)  05/30/17 173 lb 6.4 oz (78.7 kg)  05/28/17 173 lb 14.4 oz (78.9 kg)   ECOG FS:1 - Symptomatic but completely ambulatory  Sclerae unicteric, pupils round and equal Oropharynx clear and moist No cervical or supraclavicular adenopathy Lungs no rales or rhonchi Heart regular rate and rhythm Abd soft, nontender, positive bowel sounds MSK no focal spinal tenderness, no upper extremity lymphedema Neuro: nonfocal, well oriented, appropriate affect Breasts: The right breast is status post lumpectomy. There is very minimal dehiscence in both incisions, with depth of about a millimeter, and no evidence of infection. The overall cosmetic result is excellent. The left breast is benign. Both axillae are benign.  LAB RESULTS:  CMP     Component Value Date/Time   NA 139 06/09/2017 1219   K 4.0 06/09/2017 1219   CL 100 (L) 05/02/2017 2301   CO2 24 06/09/2017 1219   GLUCOSE 127 06/09/2017 1219   BUN 10.4 06/09/2017 1219   CREATININE 0.8 06/09/2017 1219   CALCIUM 9.4 06/09/2017 1219   PROT 6.9 06/09/2017 1219   ALBUMIN 3.7 06/09/2017 1219   AST 17 06/09/2017 1219   ALT 23 06/09/2017 1219   ALKPHOS 57 06/09/2017 1219   BILITOT 0.38 06/09/2017 1219   GFRNONAA >60 05/02/2017 2301   GFRAA >60 05/02/2017 2301    No results found for: TOTALPROTELP, ALBUMINELP, A1GS, A2GS, BETS, BETA2SER, GAMS, MSPIKE, SPEI  No results found for: Nils Pyle [facility]  Lab Results  Component Value Date   WBC 20.3 (H) 06/09/2017   NEUTROABS 18.4 (H) 06/09/2017   HGB 11.9 06/09/2017   HCT 35.8 06/09/2017   MCV 94.8 06/09/2017   PLT 342 06/09/2017      Chemistry      Component Value Date/Time   NA 139 06/09/2017 1219   K  4.0 06/09/2017 1219   CL 100 (L) 05/02/2017 2301   CO2 24 06/09/2017 1219   BUN 10.4 06/09/2017 1219   CREATININE 0.8 06/09/2017 1219      Component Value Date/Time   CALCIUM 9.4 06/09/2017 1219   ALKPHOS 57 06/09/2017 1219   AST 17 06/09/2017 1219   ALT 23 06/09/2017 1219   BILITOT 0.38 06/09/2017 1219       No results found for: LABCA2  No components found for: [id number]  No results for input(s): INR in the last 168 hours.  Urinalysis    Component Value Date/Time   COLORURINE YELLOW 05/02/2017 2201   APPEARANCEUR HAZY (A) 05/02/2017 2201   LABSPEC 1.014 05/02/2017 2201   PHURINE 6.0 05/02/2017 2201   GLUCOSEU NEGATIVE 05/02/2017 2201   HGBUR SMALL (A) 05/02/2017 2201   BILIRUBINUR NEGATIVE 05/02/2017 2201   BILIRUBINUR neg 08/17/2014 1123   KETONESUR NEGATIVE 05/02/2017 2201   PROTEINUR NEGATIVE 05/02/2017 2201   UROBILINOGEN negative 08/17/2014 1123   NITRITE POSITIVE (A) 05/02/2017 2201   LEUKOCYTESUR LARGE (A) 05/02/2017 2201     STUDIES: No results found.  ELIGIBLE FOR AVAILABLE RESEARCH PROTOCOL: no  ASSESSMENT: 71 y.o. [place] woman status post right breast upper inner quadrant biopsy 03/04/2015 for a clinical T1a N0, stage IA invasive ductal carcinoma, grade 1, estrogen receptor positive, progesterone receptor negative, with an MIB-1 of 3% and no HER-2 amplification  (1) anastrozole started neoadjuvantly 03/12/2017 in anticipation of possible surgical delays   (2) genetics testingNegative genetic testing on the common hereditary cancer panel.  The Hereditary Gene Panel offered by [facility] includes sequencing and/or deletion  duplication testing of the following 46 genes: APC, ATM, AXIN2, BARD1, BMPR1A, BRCA1, BRCA2, BRIP1, CDH1, CDKN2A (p14ARF), CDKN2A (p16INK4a), CHEK2, CTNNA1, DICER1, EPCAM (Deletion/duplication testing only), GREM1 (promoter region deletion/duplication testing only), KIT, MEN1, MLH1, MSH2, MSH3, MSH6, MUTYH, NBN, NF1, NHTL1, PALB2,  PDGFRA, PMS2, POLD1, POLE, PTEN, RAD50, RAD51C, RAD51D, SDHB, SDHC, SDHD, SMAD4, SMARCA4. STK11, TP53, TSC1, TSC2, and VHL.  The following genes were evaluated for sequence changes only: SDHA and HOXB13 c.251G>A variant only.  The report date is April 12, 2017.  (3) right lumpectomy and sentinel lymph node biopsy 03/26/2017 showed a pT1c pN1, stage IB invasive ductal carcinoma, grade 1, with negative margins  (4) Mammaprint returned high risk, indicating need for adjuvant chemotherapy  (5) chemotherapy will consist of Cytoxan and docetaxel given every 3 weeks 4 beginning on 04/28/2017  (a) changed to Doxorubicin and Cyclophosphamide beginning 05/19/2017, to complete 3 cycles  (b) dose reduced by 20% with the second and third CA treatments  (6) adjuvant radiation to follow   (7) continue anti-estrogens to a total of 5 years.  PLAN:  Mckenzie Webb is ready to proceed to her third cycle of chemotherapy, her second cycle of cyclophosphamide and doxorubicin. We have decreased the dose so hopefully at this time she will not have significant issues with febrile neutropenia or other infectious complications. Nevertheless she will still see [place] in a week just to make sure  Once she completes her chemotherapy she will be ready to proceed to adjuvant radiation.  She knows to call for any problems that may develop before her next visit.  Chauncey Cruel   06/09/2017 1:50 PM Medical Oncology and Hematology [facility] [address] [place], [place] [address] Tel. [phone]    Fax. [phone]   Magrinat, Virgie Dad, MD  06/09/17 1:50 PM Medical Oncology and Hematology [facility] [address] [place], [place] [address] Tel. [phone]    Fax. [phone]  This document serves as a record of services personally performed by Lurline Del, MD. It was created on her behalf by Steva Colder, a trained medical scribe. The creation of this record is based on the  scribe's personal observations and the provider's statements to them. This document has been checked and approved by the attending provider.

## 2017-06-09 ENCOUNTER — Ambulatory Visit (HOSPITAL_BASED_OUTPATIENT_CLINIC_OR_DEPARTMENT_OTHER): Payer: Medicare Other

## 2017-06-09 ENCOUNTER — Other Ambulatory Visit (HOSPITAL_BASED_OUTPATIENT_CLINIC_OR_DEPARTMENT_OTHER): Payer: Medicare Other

## 2017-06-09 ENCOUNTER — Ambulatory Visit: Payer: Medicare Other

## 2017-06-09 ENCOUNTER — Encounter: Payer: Self-pay | Admitting: *Deleted

## 2017-06-09 ENCOUNTER — Ambulatory Visit (HOSPITAL_BASED_OUTPATIENT_CLINIC_OR_DEPARTMENT_OTHER): Payer: Medicare Other | Admitting: Oncology

## 2017-06-09 VITALS — BP 174/74 | HR 82 | Temp 98.4°F | Resp 18 | Ht 64.0 in | Wt 174.0 lb

## 2017-06-09 DIAGNOSIS — C50211 Malignant neoplasm of upper-inner quadrant of right female breast: Secondary | ICD-10-CM

## 2017-06-09 DIAGNOSIS — Z452 Encounter for adjustment and management of vascular access device: Secondary | ICD-10-CM

## 2017-06-09 DIAGNOSIS — Z170 Estrogen receptor positive status [ER+]: Principal | ICD-10-CM

## 2017-06-09 DIAGNOSIS — Z95828 Presence of other vascular implants and grafts: Secondary | ICD-10-CM

## 2017-06-09 DIAGNOSIS — I427 Cardiomyopathy due to drug and external agent: Secondary | ICD-10-CM

## 2017-06-09 DIAGNOSIS — Z5111 Encounter for antineoplastic chemotherapy: Secondary | ICD-10-CM | POA: Diagnosis not present

## 2017-06-09 DIAGNOSIS — T451X5A Adverse effect of antineoplastic and immunosuppressive drugs, initial encounter: Secondary | ICD-10-CM

## 2017-06-09 LAB — COMPREHENSIVE METABOLIC PANEL
ALT: 23 U/L (ref 0–55)
AST: 17 U/L (ref 5–34)
Albumin: 3.7 g/dL (ref 3.5–5.0)
Alkaline Phosphatase: 57 U/L (ref 40–150)
Anion Gap: 10 mEq/L (ref 3–11)
BUN: 10.4 mg/dL (ref 7.0–26.0)
CHLORIDE: 105 meq/L (ref 98–109)
CO2: 24 meq/L (ref 22–29)
CREATININE: 0.8 mg/dL (ref 0.6–1.1)
Calcium: 9.4 mg/dL (ref 8.4–10.4)
EGFR: >60 mL/min/{1.73_m2} (ref >60)
Glucose: 127 mg/dl (ref 70–140)
Potassium: 4 mEq/L (ref 3.5–5.1)
SODIUM: 139 meq/L (ref 136–145)
Total Bilirubin: 0.38 mg/dL (ref 0.20–1.20)
Total Protein: 6.9 g/dL (ref 6.4–8.3)

## 2017-06-09 LAB — CBC WITH DIFFERENTIAL/PLATELET
BASO%: 0.2 % (ref 0.0–2.0)
BASOS ABS: 0 10*3/uL (ref 0.0–0.1)
EOS ABS: 0 10*3/uL (ref 0.0–0.5)
EOS%: 0 % (ref 0.0–7.0)
HCT: 35.8 % (ref 34.8–46.6)
HGB: 11.9 g/dL (ref 11.6–15.9)
LYMPH#: 0.8 10*3/uL — AB (ref 0.9–3.3)
LYMPH%: 4 % — ABNORMAL LOW (ref 14.0–49.7)
MCH: 31.6 pg (ref 25.1–34.0)
MCHC: 33.3 g/dL (ref 31.5–36.0)
MCV: 94.8 fL (ref 79.5–101.0)
MONO#: 1 10*3/uL — AB (ref 0.1–0.9)
MONO%: 5 % (ref 0.0–14.0)
NEUT#: 18.4 10*3/uL — ABNORMAL HIGH (ref 1.5–6.5)
NEUT%: 90.8 % — ABNORMAL HIGH (ref 38.4–76.8)
Platelets: 342 10*3/uL (ref 145–400)
RBC: 3.77 10*6/uL (ref 3.70–5.45)
RDW: 16.5 % — AB (ref 11.2–14.5)
WBC: 20.3 10*3/uL — AB (ref 3.9–10.3)

## 2017-06-09 MED ORDER — SODIUM CHLORIDE 0.9 % IV SOLN
Freq: Once | INTRAVENOUS | Status: AC
  Administered 2017-06-09: 15:00:00 via INTRAVENOUS
  Filled 2017-06-09: qty 5

## 2017-06-09 MED ORDER — HEPARIN SOD (PORK) LOCK FLUSH 100 UNIT/ML IV SOLN
500.0000 [IU] | Freq: Once | INTRAVENOUS | Status: AC | PRN
  Administered 2017-06-09: 500 [IU]
  Filled 2017-06-09: qty 5

## 2017-06-09 MED ORDER — CYCLOSPORINE 0.05 % OP EMUL: 1.0000 [drp] | Freq: Two times a day (BID) | OPHTHALMIC | 1 refills | Status: DC

## 2017-06-09 MED ORDER — SODIUM CHLORIDE 0.9% FLUSH
10.0000 mL | Freq: Once | INTRAVENOUS | Status: AC
  Administered 2017-06-09: 10 mL
  Filled 2017-06-09: qty 10

## 2017-06-09 MED ORDER — ALTEPLASE 2 MG IJ SOLR
2.0000 mg | Freq: Once | INTRAMUSCULAR | Status: AC
  Administered 2017-06-09: 2 mg
  Filled 2017-06-09: qty 2

## 2017-06-09 MED ORDER — SODIUM CHLORIDE 0.9 % IV SOLN
480.0000 mg/m2 | Freq: Once | INTRAVENOUS | Status: AC
  Administered 2017-06-09: 900 mg via INTRAVENOUS
  Filled 2017-06-09: qty 45

## 2017-06-09 MED ORDER — DOXORUBICIN HCL CHEMO IV INJECTION 2 MG/ML
48.0000 mg/m2 | Freq: Once | INTRAVENOUS | Status: AC
  Administered 2017-06-09: 90 mg via INTRAVENOUS
  Filled 2017-06-09: qty 45

## 2017-06-09 MED ORDER — PEGFILGRASTIM 6 MG/0.6ML ~~LOC~~ PSKT
6.0000 mg | PREFILLED_SYRINGE | Freq: Once | SUBCUTANEOUS | Status: AC
  Administered 2017-06-09: 6 mg via SUBCUTANEOUS
  Filled 2017-06-09: qty 0.6

## 2017-06-09 MED ORDER — PALONOSETRON HCL INJECTION 0.25 MG/5ML
0.2500 mg | Freq: Once | INTRAVENOUS | Status: AC
  Administered 2017-06-09: 0.25 mg via INTRAVENOUS

## 2017-06-09 MED ORDER — SODIUM CHLORIDE 0.9% FLUSH
10.0000 mL | INTRAVENOUS | Status: DC | PRN
Start: 2017-06-09 — End: 2017-06-09
  Administered 2017-06-09: 10 mL
  Filled 2017-06-09: qty 10

## 2017-06-09 MED ORDER — SODIUM CHLORIDE 0.9 % IV SOLN
Freq: Once | INTRAVENOUS | Status: AC
  Administered 2017-06-09: 14:00:00 via INTRAVENOUS

## 2017-06-09 MED ORDER — PALONOSETRON HCL INJECTION 0.25 MG/5ML
INTRAVENOUS | Status: AC
  Filled 2017-06-09: qty 5

## 2017-06-09 NOTE — Patient Instructions (Signed)
Implanted Port Home Guide An implanted port is a type of central line that is placed under the skin. Central lines are used to provide IV access when treatment or nutrition needs to be given through a person's veins. Implanted ports are used for long-term IV access. An implanted port may be placed because:  You need IV medicine that would be irritating to the small veins in your hands or arms.  You need long-term IV medicines, such as antibiotics.  You need IV nutrition for a long period.  You need frequent blood draws for lab tests.  You need dialysis.  Implanted ports are usually placed in the chest area, but they can also be placed in the upper arm, the abdomen, or the leg. An implanted port has two main parts:  Reservoir. The reservoir is round and will appear as a small, raised area under your skin. The reservoir is the part where a needle is inserted to give medicines or draw blood.  Catheter. The catheter is a thin, flexible tube that extends from the reservoir. The catheter is placed into a large vein. Medicine that is inserted into the reservoir goes into the catheter and then into the vein.  How will I care for my incision site? Do not get the incision site wet. Bathe or shower as directed by your health care provider. How is my port accessed? Special steps must be taken to access the port:  Before the port is accessed, a numbing cream can be placed on the skin. This helps numb the skin over the port site.  Your health care provider uses a sterile technique to access the port. ? Your health care provider must put on a mask and sterile gloves. ? The skin over your port is cleaned carefully with an antiseptic and allowed to dry. ? The port is gently pinched between sterile gloves, and a needle is inserted into the port.  Only "non-coring" port needles should be used to access the port. Once the port is accessed, a blood return should be checked. This helps ensure that the port  is in the vein and is not clogged.  If your port needs to remain accessed for a constant infusion, a clear (transparent) bandage will be placed over the needle site. The bandage and needle will need to be changed every week, or as directed by your health care provider.  Keep the bandage covering the needle clean and dry. Do not get it wet. Follow your health care provider's instructions on how to take a shower or bath while the port is accessed.  If your port does not need to stay accessed, no bandage is needed over the port.  What is flushing? Flushing helps keep the port from getting clogged. Follow your health care provider's instructions on how and when to flush the port. Ports are usually flushed with saline solution or a medicine called heparin. The need for flushing will depend on how the port is used.  If the port is used for intermittent medicines or blood draws, the port will need to be flushed: ? After medicines have been given. ? After blood has been drawn. ? As part of routine maintenance.  If a constant infusion is running, the port may not need to be flushed.  How long will my port stay implanted? The port can stay in for as long as your health care provider thinks it is needed. When it is time for the port to come out, surgery will be   done to remove it. The procedure is similar to the one performed when the port was put in. When should I seek immediate medical care? When you have an implanted port, you should seek immediate medical care if:  You notice a bad smell coming from the incision site.  You have swelling, redness, or drainage at the incision site.  You have more swelling or pain at the port site or the surrounding area.  You have a fever that is not controlled with medicine.  This information is not intended to replace advice given to you by your health care provider. Make sure you discuss any questions you have with your health care provider. Document  Released: 08/05/2005 Document Revised: 01/11/2016 Document Reviewed: 04/12/2013 Elsevier Interactive Patient Education  2017 Elsevier Inc.  

## 2017-06-09 NOTE — Progress Notes (Signed)
Port-A-Cath accessed, no blood return noted. No swelling or pain noted upon flushing with saline. Cath Flo instilled per policy. Site marked. Will monitor for blood return.

## 2017-06-09 NOTE — Patient Instructions (Signed)
[place] Discharge Instructions for Patients Receiving Chemotherapy  Today you received the following chemotherapy agents Adriamycin and Cytoxan  To help prevent nausea and vomiting after your treatment, we encourage you to take your nausea medication as directed If you develop nausea and vomiting that is not controlled by your nausea medication, call the clinic.   BELOW ARE SYMPTOMS THAT SHOULD BE REPORTED IMMEDIATELY:  *FEVER GREATER THAN 100.5 F  *CHILLS WITH OR WITHOUT FEVER  NAUSEA AND VOMITING THAT IS NOT CONTROLLED WITH YOUR NAUSEA MEDICATION  *UNUSUAL SHORTNESS OF BREATH  *UNUSUAL BRUISING OR BLEEDING  TENDERNESS IN MOUTH AND THROAT WITH OR WITHOUT PRESENCE OF ULCERS  *URINARY PROBLEMS  *BOWEL PROBLEMS  UNUSUAL RASH Items with * indicate a potential emergency and should be followed up as soon as possible.  Feel free to call the clinic should you have any questions or concerns. The clinic phone number is [phone].  Please show the [place] at check-in to the Emergency Department and triage nurse.

## 2017-06-10 ENCOUNTER — Other Ambulatory Visit: Payer: Self-pay | Admitting: Oncology

## 2017-06-10 NOTE — Addendum Note (Signed)
Addended by: Chauncey Cruel on: 06/10/2017 08:37 AM   Modules accepted: Level of Service

## 2017-06-16 ENCOUNTER — Encounter: Payer: Self-pay | Admitting: Adult Health

## 2017-06-16 ENCOUNTER — Other Ambulatory Visit: Payer: Self-pay | Admitting: *Deleted

## 2017-06-16 ENCOUNTER — Other Ambulatory Visit (HOSPITAL_BASED_OUTPATIENT_CLINIC_OR_DEPARTMENT_OTHER): Payer: Medicare Other

## 2017-06-16 ENCOUNTER — Ambulatory Visit (HOSPITAL_BASED_OUTPATIENT_CLINIC_OR_DEPARTMENT_OTHER): Payer: Medicare Other | Admitting: Adult Health

## 2017-06-16 ENCOUNTER — Ambulatory Visit: Payer: Medicare Other

## 2017-06-16 ENCOUNTER — Ambulatory Visit (HOSPITAL_BASED_OUTPATIENT_CLINIC_OR_DEPARTMENT_OTHER): Payer: Medicare Other

## 2017-06-16 VITALS — BP 153/66 | HR 71 | Temp 98.3°F | Resp 17 | Ht 64.0 in | Wt 172.5 lb

## 2017-06-16 DIAGNOSIS — Z170 Estrogen receptor positive status [ER+]: Principal | ICD-10-CM

## 2017-06-16 DIAGNOSIS — I427 Cardiomyopathy due to drug and external agent: Secondary | ICD-10-CM

## 2017-06-16 DIAGNOSIS — Z79811 Long term (current) use of aromatase inhibitors: Secondary | ICD-10-CM | POA: Diagnosis not present

## 2017-06-16 DIAGNOSIS — C50211 Malignant neoplasm of upper-inner quadrant of right female breast: Secondary | ICD-10-CM

## 2017-06-16 DIAGNOSIS — T451X5A Adverse effect of antineoplastic and immunosuppressive drugs, initial encounter: Secondary | ICD-10-CM

## 2017-06-16 DIAGNOSIS — Z95828 Presence of other vascular implants and grafts: Secondary | ICD-10-CM

## 2017-06-16 LAB — COMPREHENSIVE METABOLIC PANEL
ALBUMIN: 3.4 g/dL — AB (ref 3.5–5.0)
ALK PHOS: 72 U/L (ref 40–150)
ALT: 17 U/L (ref 0–55)
AST: 12 U/L (ref 5–34)
Anion Gap: 8 mEq/L (ref 3–11)
BUN: 10.4 mg/dL (ref 7.0–26.0)
CO2: 25 mEq/L (ref 22–29)
Calcium: 9.1 mg/dL (ref 8.4–10.4)
Chloride: 104 mEq/L (ref 98–109)
Creatinine: 0.7 mg/dL (ref 0.6–1.1)
EGFR: >60 mL/min/{1.73_m2} (ref >60)
GLUCOSE: 122 mg/dL (ref 70–140)
POTASSIUM: 3.9 meq/L (ref 3.5–5.1)
SODIUM: 138 meq/L (ref 136–145)
Total Bilirubin: 0.62 mg/dL (ref 0.20–1.20)
Total Protein: 5.9 g/dL — ABNORMAL LOW (ref 6.4–8.3)

## 2017-06-16 LAB — CBC WITH DIFFERENTIAL/PLATELET
BASO%: 2.2 % — ABNORMAL HIGH (ref 0.0–2.0)
BASOS ABS: 0 10*3/uL (ref 0.0–0.1)
EOS ABS: 0.1 10*3/uL (ref 0.0–0.5)
EOS%: 7.7 % — AB (ref 0.0–7.0)
HCT: 34.3 % — ABNORMAL LOW (ref 34.8–46.6)
HGB: 11.3 g/dL — ABNORMAL LOW (ref 11.6–15.9)
LYMPH%: 42.9 % (ref 14.0–49.7)
MCH: 31.5 pg (ref 25.1–34.0)
MCHC: 32.9 g/dL (ref 31.5–36.0)
MCV: 95.5 fL (ref 79.5–101.0)
MONO#: 0.1 10*3/uL (ref 0.1–0.9)
MONO%: 6.6 % (ref 0.0–14.0)
NEUT#: 0.4 10*3/uL — CL (ref 1.5–6.5)
NEUT%: 40.6 % (ref 38.4–76.8)
Platelets: 136 10*3/uL — ABNORMAL LOW (ref 145–400)
RBC: 3.59 10*6/uL — ABNORMAL LOW (ref 3.70–5.45)
RDW: 14.9 % — ABNORMAL HIGH (ref 11.2–14.5)
WBC: 0.9 10*3/uL — CL (ref 3.9–10.3)
lymph#: 0.4 10*3/uL — ABNORMAL LOW (ref 0.9–3.3)

## 2017-06-16 MED ORDER — HEPARIN SOD (PORK) LOCK FLUSH 100 UNIT/ML IV SOLN
500.0000 [IU] | Freq: Once | INTRAVENOUS | Status: AC
  Administered 2017-06-16: 500 [IU]
  Filled 2017-06-16: qty 5

## 2017-06-16 MED ORDER — SODIUM CHLORIDE 0.9% FLUSH
10.0000 mL | Freq: Once | INTRAVENOUS | Status: AC
  Administered 2017-06-16: 10 mL
  Filled 2017-06-16: qty 10

## 2017-06-16 MED ORDER — PROCHLORPERAZINE MALEATE 10 MG PO TABS: 10.0000 mg | ORAL_TABLET | Freq: Four times a day (QID) | ORAL | 1 refills | Status: DC | PRN

## 2017-06-16 NOTE — Patient Instructions (Signed)
Implanted Port Home Guide An implanted port is a type of central line that is placed under the skin. Central lines are used to provide IV access when treatment or nutrition needs to be given through a person's veins. Implanted ports are used for long-term IV access. An implanted port may be placed because:  You need IV medicine that would be irritating to the small veins in your hands or arms.  You need long-term IV medicines, such as antibiotics.  You need IV nutrition for a long period.  You need frequent blood draws for lab tests.  You need dialysis.  Implanted ports are usually placed in the chest area, but they can also be placed in the upper arm, the abdomen, or the leg. An implanted port has two main parts:  Reservoir. The reservoir is round and will appear as a small, raised area under your skin. The reservoir is the part where a needle is inserted to give medicines or draw blood.  Catheter. The catheter is a thin, flexible tube that extends from the reservoir. The catheter is placed into a large vein. Medicine that is inserted into the reservoir goes into the catheter and then into the vein.  How will I care for my incision site? Do not get the incision site wet. Bathe or shower as directed by your health care provider. How is my port accessed? Special steps must be taken to access the port:  Before the port is accessed, a numbing cream can be placed on the skin. This helps numb the skin over the port site.  Your health care provider uses a sterile technique to access the port. ? Your health care provider must put on a mask and sterile gloves. ? The skin over your port is cleaned carefully with an antiseptic and allowed to dry. ? The port is gently pinched between sterile gloves, and a needle is inserted into the port.  Only "non-coring" port needles should be used to access the port. Once the port is accessed, a blood return should be checked. This helps ensure that the port  is in the vein and is not clogged.  If your port needs to remain accessed for a constant infusion, a clear (transparent) bandage will be placed over the needle site. The bandage and needle will need to be changed every week, or as directed by your health care provider.  Keep the bandage covering the needle clean and dry. Do not get it wet. Follow your health care provider's instructions on how to take a shower or bath while the port is accessed.  If your port does not need to stay accessed, no bandage is needed over the port.  What is flushing? Flushing helps keep the port from getting clogged. Follow your health care provider's instructions on how and when to flush the port. Ports are usually flushed with saline solution or a medicine called heparin. The need for flushing will depend on how the port is used.  If the port is used for intermittent medicines or blood draws, the port will need to be flushed: ? After medicines have been given. ? After blood has been drawn. ? As part of routine maintenance.  If a constant infusion is running, the port may not need to be flushed.  How long will my port stay implanted? The port can stay in for as long as your health care provider thinks it is needed. When it is time for the port to come out, surgery will be   done to remove it. The procedure is similar to the one performed when the port was put in. When should I seek immediate medical care? When you have an implanted port, you should seek immediate medical care if:  You notice a bad smell coming from the incision site.  You have swelling, redness, or drainage at the incision site.  You have more swelling or pain at the port site or the surrounding area.  You have a fever that is not controlled with medicine.  This information is not intended to replace advice given to you by your health care provider. Make sure you discuss any questions you have with your health care provider. Document  Released: 08/05/2005 Document Revised: 01/11/2016 Document Reviewed: 04/12/2013 Elsevier Interactive Patient Education  2017 Elsevier Inc.  

## 2017-06-16 NOTE — Progress Notes (Signed)
[place]  [phone] Fax:([phone]     ID: Mckenzie Webb DOB: 19-Nov-1945  MR#: [id number]  OMB#:[id number]  Patient Care Team: Rita Ohara, MD as PCP - General (Family Medicine) Jovita Kussmaul, MD as Consulting Physician (General Surgery) Magrinat, Virgie Dad, MD as Consulting Physician (Oncology) Eppie Gibson, MD as Attending Physician (Radiation Oncology) Juanita Craver, MD as Consulting Physician (Gastroenterology) Vania Rea, MD as Consulting Physician (Obstetrics and Gynecology) Elsie Saas, MD as Consulting Physician (Orthopedic Surgery) Martinique, Amy, MD as Consulting Physician (Dermatology) OTHER MD:  CHIEF COMPLAINT: Estrogen receptor positive breast cancer  CURRENT TREATMENT: Adjuvant chemotherapy   BREAST CANCER HISTORY: From the original intake note:  "Mckenzie Webb" had bilateral screening mammography with tomography at the [facility] 02/21/2017. This showed a possible mass in the right breast. Right diagnostic mammography with tomography and ultrasonography 02/27/2017 found a breast density to be category B. In the subareolar right breast there was a 0.5 cm spiculated mass, which was not palpable. There was mild right nipple inversion, which per report was chronic. Ultrasonography confirmed a 0.5 cm retroareolar breast mass at the 1:00 radiant. The right axilla was sonographically benign.  On 03/03/2017 the patient underwent biopsy of the right breast mass in question. This showed (SAA [id number]) and invasive ductal carcinoma, grade 1, estrogen receptor 100% positive, progesterone receptor negative, with an MIB-1 of 3%, and no HER-2 implication, the signals ratio being 1.25 and the number per cell 2.00.  The patient's subsequent history is as detailed below.  INTERVAL HISTORY: Mckenzie Webb returns today for follow-up of her estrogen receptor positive breast cancer, currently being treated adjuvantly with cyclophosphamide and doxorubicin. Today  is cycle 2 day 8 of treatment.  She is neutropenic today.  She denies any fever or chills.     REVIEW OF SYSTEMS: Mckenzie Webb does continue to have some erythema on her right breast which does continue to improve.  She continues to apply clindagel.  She says the area isn't worsening right now.  She says she feels like its been there forever.  She denies any other issues today and a detailed ROS is otherwise non contributory.     PAST MEDICAL HISTORY: Past Medical History:  Diagnosis Date  . Allergy   . DJD (degenerative joint disease), cervical 04 2002  . Edema    resolved  . Family history of colon cancer   . Family history of pancreatic cancer   . GERD (gastroesophageal reflux disease)   . Hyperlipidemia   . Hypothyroid   . Interstitial cystitis   . Leukopenia   . PONV (postoperative nausea and vomiting)   . Psoriasis     PAST SURGICAL HISTORY: Past Surgical History:  Procedure Laterality Date  . BREAST LUMPECTOMY WITH RADIOACTIVE SEED AND SENTINEL LYMPH NODE BIOPSY Right 03/26/2017   Procedure: BREAST LUMPECTOMY WITH RADIOACTIVE SEED AND SENTINEL LYMPH NODE BIOPSY;  Surgeon: Jovita Kussmaul, MD;  Location: [place] [facility];  Service: General;  Laterality: Right;  . CHOLECYSTECTOMY    . COLONOSCOPY  3/09  . KNEE ARTHROSCOPY Right 1990  . KNEE ARTHROSCOPY Left 12/12/2010    (Dr. Noemi Chapel)  . PORTACATH PLACEMENT Left 04/24/2017   Procedure: INSERTION PORT-A-CATH;  Surgeon: Jovita Kussmaul, MD;  Location: [facility];  Service: General;  Laterality: Left;  . TUBAL LIGATION  1983  . VARICOSE VEIN SURGERY  09/2009 R, 06/2010 L    FAMILY HISTORY Family History  Problem Relation Age of Onset  . Hypertension Mother   .  Heart disease Mother   . Gallbladder disease Mother   . Thyroid disease Mother   . Arthritis Mother   . Diabetes Father   . Cancer Father        pancreatic  . Hypertension Sister   . Gallbladder disease Sister   . Diabetes Sister   . Diverticulitis Sister         of small intestine  . Cancer Son        testicular cancer  . Stroke Sister        late 31's  . Hypertension Sister   . Pulmonary embolism Sister   . Ulcerative colitis Sister   . COPD Brother        had lung lobe removed for suspected cancer, but was benign; smoker  . Colon cancer Other 84  . Lung cancer Cousin        maternal first cousin - smoker  . Breast cancer Neg Hx   The patient's father died from pancreatic cancer at the age of 33. The patient's mother died from a myocardial infarction at age 78. The patient had one brother, 3 sisters. The patient's older son was diagnosed with testicular cancer at age 41. A maternal niece was diagnosed with colon cancer at age 19 and a maternal cousin with lung cancer at an unknown age.  GYNECOLOGIC HISTORY:  No LMP recorded. Patient is postmenopausal.  Menarche age 43, first live birth age 43, the patient is Carpenter P2. she went through the change of life approximately the year 2000. She did not take hormone replacement. She did take oral contraceptives for more than 20 years remotely, with no complications.  SOCIAL HISTORY:  She is a retired Web designer. Her husband Arnette Norris is an Clinical biochemist, still working part-time. Son Mckenzie Webb (from the patient's first marriage) lives in [place] and works in Therapist, art. Son Mckenzie Webb (from patient second marriage) lives in [place] we are is Comptroller. The patient has one grandchild. She is not a Ambulance person.   ADVANCED DIRECTIVES: Not in place   HEALTH MAINTENANCE: Social History  Substance Use Topics  . Smoking status: Never Smoker  . Smokeless tobacco: Never Used  . Alcohol use No     Comment: social, 1-2 times per year     Colonoscopy:2009/[facility] and  PAP:  [facility], T score -0.1 (normal)   Allergies  Allergen Reactions  . Iodine Swelling  . Sulfa Antibiotics Swelling    Lips swell, itching  . Penicillins Rash    Has patient had a PCN  reaction causing immediate rash, facial/tongue/throat swelling, SOB or lightheadedness with hypotension: No Has patient had a PCN reaction causing severe rash involving mucus membranes or skin necrosis: No Has patient had a PCN reaction that required hospitalization: No Has patient had a PCN reaction occurring within the last 10 years: No If all of the above answers are "NO", then may proceed with Cephalosporin use.   Newell Coral [Bacitracin-Polymyxin B] Swelling    Used for eye infection--got redder and worse  . Ciprofloxacin     After she takes for a while it gives her a "funny feeling."  . Codeine Nausea Only    Current Outpatient Prescriptions  Medication Sig Dispense Refill  . Calcium Carbonate-Vitamin D (CALCIUM 600+D) 600-400 MG-UNIT tablet Take 1 tablet by mouth daily.    . Cholecalciferol (VITAMIN D) 1000 UNITS capsule Take 1,000 Units by mouth daily.      . clindamycin (CLINDAGEL) 1 %  gel Apply topically 2 (two) times daily. 30 g 0  . Coenzyme Q10 (COQ10) 100 MG CAPS Take 100 mg by mouth daily.    Marland Kitchen dexamethasone (DECADRON) 4 MG tablet Take 2 tablets by mouth once a day on the day after chemotherapy and then take 2 tablets two times a day for 2 days. Take with food. 30 tablet 1  . diclofenac (VOLTAREN) 75 MG EC tablet Take 75 mg by mouth 2 (two) times daily.    Marland Kitchen levothyroxine (SYNTHROID, LEVOTHROID) 88 MCG tablet Take 1 tablet (88 mcg total) by mouth daily. (Patient taking differently: Take 88 mcg by mouth daily before breakfast. 30 minutes before eating anything) 90 tablet 3  . lidocaine-prilocaine (EMLA) cream Apply to affected area once 30 g 3  . loratadine (CLARITIN) 10 MG tablet Take 10 mg by mouth daily as needed for allergies.     Marland Kitchen LORazepam (ATIVAN) 0.5 MG tablet Take 1 tablet (0.5 mg total) by mouth every 6 (six) hours as needed (Nausea or vomiting). 30 tablet 0  . Multiple Vitamins-Minerals (ALIVE WOMENS 50+ PO) Take 1 tablet by mouth daily.     . Omega-3 Fatty Acids  (FISH OIL) 1200 MG CAPS Take 2,400 mg by mouth at bedtime.    Marland Kitchen omeprazole (PRILOSEC) 40 MG capsule TAKE ONE (1) CAPSULE EACH DAY 90 capsule 2  . ondansetron (ZOFRAN) 8 MG tablet Take 1 tablet (8 mg total) by mouth 2 (two) times daily as needed. Start on the third day after chemotherapy. 30 tablet 1  . prochlorperazine (COMPAZINE) 10 MG tablet Take 1 tablet (10 mg total) by mouth every 6 (six) hours as needed (Nausea or vomiting). 90 tablet 1  . Red Yeast Rice Extract (RED YEAST RICE PO) Take 1,200 mg by mouth 2 (two) times daily.     Marland Kitchen venlafaxine XR (EFFEXOR-XR) 75 MG 24 hr capsule Take 1 capsule (75 mg total) by mouth daily with breakfast. 90 capsule 1  . vitamin B-12 (CYANOCOBALAMIN) 1000 MCG tablet Take 1,000 mcg by mouth daily.    Marland Kitchen anastrozole (ARIMIDEX) 1 MG tablet Take 1 tablet (1 mg total) by mouth daily. (Patient not taking: Reported on 06/16/2017) 90 tablet 4  . LORazepam (ATIVAN) 0.5 MG tablet Take 0.5 mg by mouth at bedtime as needed.  0   No current facility-administered medications for this visit.     OBJECTIVE:   Vitals:   06/16/17 0926  BP: (!) 153/66  Pulse: 71  Resp: 17  Temp: 98.3 F (36.8 C)  SpO2: 100%     Body mass index is 29.61 kg/m.   Wt Readings from Last 3 Encounters:  06/16/17 172 lb 8 oz (78.2 kg)  06/09/17 174 lb (78.9 kg)  05/30/17 173 lb 6.4 oz (78.7 kg)   ECOG FS:1 - Symptomatic but completely ambulatory GENERAL: Patient is a well appearing female in no acute distress HEENT:  Sclerae anicteric.  Oropharynx clear and moist. No ulcerations or evidence of oropharyngeal candidiasis. Neck is supple.  NODES:  No cervical, supraclavicular, or axillary lymphadenopathy palpated.  BREAST EXAM:  Right breast is erythematous, medial lumpectomy scar is slightly opened but no longer draining and appears to be healing up quite well, no warmth or tenderness to the area whatsoever LUNGS:  Clear to auscultation bilaterally.  No wheezes or rhonchi. HEART:  Regular  rate and rhythm. No murmur appreciated. ABDOMEN:  Soft, nontender.  Positive, normoactive bowel sounds. No organomegaly palpated. MSK:  No focal spinal tenderness to palpation. Full  range of motion bilaterally in the upper extremities. EXTREMITIES:  No peripheral edema.   SKIN:  Clear with no obvious rashes or skin changes. No nail dyscrasia. NEURO:  Nonfocal. Well oriented.  Appropriate affect.    LAB RESULTS:  CMP     Component Value Date/Time   NA 139 06/09/2017 1219   K 4.0 06/09/2017 1219   CL 100 (L) 05/02/2017 2301   CO2 24 06/09/2017 1219   GLUCOSE 127 06/09/2017 1219   BUN 10.4 06/09/2017 1219   CREATININE 0.8 06/09/2017 1219   CALCIUM 9.4 06/09/2017 1219   PROT 6.9 06/09/2017 1219   ALBUMIN 3.7 06/09/2017 1219   AST 17 06/09/2017 1219   ALT 23 06/09/2017 1219   ALKPHOS 57 06/09/2017 1219   BILITOT 0.38 06/09/2017 1219   GFRNONAA >60 05/02/2017 2301   GFRAA >60 05/02/2017 2301    No results found for: TOTALPROTELP, ALBUMINELP, A1GS, A2GS, BETS, BETA2SER, GAMS, MSPIKE, SPEI  No results found for: Nils Pyle [facility]  Lab Results  Component Value Date   WBC 20.3 (H) 06/09/2017   NEUTROABS 18.4 (H) 06/09/2017   HGB 11.9 06/09/2017   HCT 35.8 06/09/2017   MCV 94.8 06/09/2017   PLT 342 06/09/2017      Chemistry      Component Value Date/Time   NA 139 06/09/2017 1219   K 4.0 06/09/2017 1219   CL 100 (L) 05/02/2017 2301   CO2 24 06/09/2017 1219   BUN 10.4 06/09/2017 1219   CREATININE 0.8 06/09/2017 1219      Component Value Date/Time   CALCIUM 9.4 06/09/2017 1219   ALKPHOS 57 06/09/2017 1219   AST 17 06/09/2017 1219   ALT 23 06/09/2017 1219   BILITOT 0.38 06/09/2017 1219       No results found for: LABCA2  No components found for: [id number]  No results for input(s): INR in the last 168 hours.  Urinalysis    Component Value Date/Time   COLORURINE YELLOW 05/02/2017 2201   APPEARANCEUR HAZY (A) 05/02/2017 2201   LABSPEC  1.014 05/02/2017 2201   PHURINE 6.0 05/02/2017 2201   GLUCOSEU NEGATIVE 05/02/2017 2201   HGBUR SMALL (A) 05/02/2017 2201   BILIRUBINUR NEGATIVE 05/02/2017 2201   BILIRUBINUR neg 08/17/2014 1123   KETONESUR NEGATIVE 05/02/2017 2201   PROTEINUR NEGATIVE 05/02/2017 2201   UROBILINOGEN negative 08/17/2014 1123   NITRITE POSITIVE (A) 05/02/2017 2201   LEUKOCYTESUR LARGE (A) 05/02/2017 2201     STUDIES: No results found.  ELIGIBLE FOR AVAILABLE RESEARCH PROTOCOL: no  ASSESSMENT: 71 y.o. [place] woman status post right breast upper inner quadrant biopsy 03/04/2015 for a clinical T1a N0, stage IA invasive ductal carcinoma, grade 1, estrogen receptor positive, progesterone receptor negative, with an MIB-1 of 3% and no HER-2 amplification  (1) anastrozole started neoadjuvantly 03/12/2017 in anticipation of possible surgical delays   (2) genetics testingNegative genetic testing on the common hereditary cancer panel.  The Hereditary Gene Panel offered by [facility] includes sequencing and/or deletion duplication testing of the following 46 genes: APC, ATM, AXIN2, BARD1, BMPR1A, BRCA1, BRCA2, BRIP1, CDH1, CDKN2A (p14ARF), CDKN2A (p16INK4a), CHEK2, CTNNA1, DICER1, EPCAM (Deletion/duplication testing only), GREM1 (promoter region deletion/duplication testing only), KIT, MEN1, MLH1, MSH2, MSH3, MSH6, MUTYH, NBN, NF1, NHTL1, PALB2, PDGFRA, PMS2, POLD1, POLE, PTEN, RAD50, RAD51C, RAD51D, SDHB, SDHC, SDHD, SMAD4, SMARCA4. STK11, TP53, TSC1, TSC2, and VHL.  The following genes were evaluated for sequence changes only: SDHA and HOXB13 c.251G>A variant only.  The report date is April 12, 2017.  (  3) right lumpectomy and sentinel lymph node biopsy 03/26/2017 showed a pT1c pN1, stage IB invasive ductal carcinoma, grade 1, with negative margins  (4) Mammaprint returned high risk, indicating need for adjuvant chemotherapy  (5) chemotherapy will consist of Cytoxan and docetaxel given every 3 weeks 4 beginning  on 04/28/2017  (a) changed to Doxorubicin and Cyclophosphamide beginning 05/19/2017, to complete 3 cycles  (b) dose reduced by 20% with the second and third CA treatments  (6) adjuvant radiation to follow   (7) continue anti-estrogens to a total of 5 years.  PLAN: Mckenzie Webb is doing well today.  She is mildly neutropenic today following chemotherapy and I did review neutropenic precautions with her in detail. She will continue to apply Clindagel to her breast lesion.  It looks close to being resolved.  She will return in 2 weeks for labs and f/u with Dr. [name] for cycle 3 of Doxorubicin and Cyclophosphamide.     She knows to call for any problems that may develop before her next visit.  A total of (30) minutes of face-to-face time was spent with this patient with greater than 50% of that time in counseling and care-coordination.  Scot Dock   06/16/2017 9:33 AM Medical Oncology and Hematology [facility] [address] [place], [place] [address] Tel. [phone]    Fax. [phone]

## 2017-06-23 ENCOUNTER — Other Ambulatory Visit: Payer: Medicare Other

## 2017-06-23 ENCOUNTER — Telehealth: Payer: Self-pay | Admitting: Internal Medicine

## 2017-06-23 ENCOUNTER — Ambulatory Visit: Payer: Medicare Other | Admitting: Adult Health

## 2017-06-23 MED ORDER — OMEPRAZOLE 40 MG PO CPDR: DELAYED_RELEASE_CAPSULE | ORAL | 0 refills | Status: DC

## 2017-06-23 NOTE — Telephone Encounter (Signed)
I called in medication. I looked up until April and cannot find a spot for her. Would you like to look?

## 2017-06-23 NOTE — Telephone Encounter (Signed)
Refill request for omeprazole 55m #90 to [facility] [place], [place]

## 2017-06-23 NOTE — Telephone Encounter (Signed)
Jan 14, 17 or 23 as second PE of the afternoon.  Kathy--please contact pt to schedule her AWV.  It is due in December, but due to my vacation, I can't see her until January.  Okay to schedule for one of the three dates above, in the afternoon.

## 2017-06-23 NOTE — Telephone Encounter (Signed)
She has been getting treated for cancer. Her last wellness visit was 07/2016, and she doesn't have one scheduled for this year.  Okay to refill x 90d, but schedule wellness visit

## 2017-06-23 NOTE — Telephone Encounter (Signed)
Is this okay to refill? Looks like she has not been seen in a while, do you want me to schedule a med check?

## 2017-06-24 NOTE — Telephone Encounter (Signed)
Pt was called and a appt was made

## 2017-06-27 NOTE — Progress Notes (Signed)
[place]  [phone] [phone]     ID: Mckenzie Webb DOB: 03/25/1946  MR#: [id number]  KNL#:[id number]  Patient Care Team: Rita Ohara, MD as PCP - General (Family Medicine) Jovita Kussmaul, MD as Consulting Physician (General Surgery) Mirra Basilio, Virgie Dad, MD as Consulting Physician (Oncology) Eppie Gibson, MD as Attending Physician (Radiation Oncology) Juanita Craver, MD as Consulting Physician (Gastroenterology) Vania Rea, MD as Consulting Physician (Obstetrics and Gynecology) Elsie Saas, MD as Consulting Physician (Orthopedic Surgery) Martinique, Amy, MD as Consulting Physician (Dermatology) OTHER MD:  CHIEF COMPLAINT: Estrogen receptor positive breast cancer  CURRENT TREATMENT: Adjuvant chemotherapy   BREAST CANCER HISTORY: From the original intake note:  "Mckenzie Webb" had bilateral screening mammography with tomography at [facility]/21/2017. This showed a possible mass in the right breast. Right diagnostic mammography with tomography and ultrasonography 02/27/2017 found a breast density to be category B. In the subareolar right breast there was a 0.5 cm spiculated mass, which was not palpable. There was mild right nipple inversion, which per report was chronic. Ultrasonography confirmed a 0.5 cm retroareolar breast mass at the 1:00 radiant. The right axilla was sonographically benign.  On 03/03/2017 the patient underwent biopsy of the right breast mass in question. This showed (SAA [phone]) and invasive ductal carcinoma, grade 1, estrogen receptor 100% positive, progesterone receptor negative, with an MIB-1 of 3%, and no HER-2 implication, the signals ratio being 1.25 and the number per cell 2.00.  The patient's subsequent history is as detailed below.  INTERVAL HISTORY: Mckenzie Webb returns today for follow-up and treatment of her estrogen receptor positive breast cancer. She is accompanied by her husband, Arnette Norris. Today is day 1 cycle 3  of 3 planned cycles of cyclophosphamide and doxorubicin, given every 21 days, at reduced doses.  This is her fourth cycle of chemotherapy overall.    REVIEW OF SYSTEMS: Mckenzie Webb reports she currently doesn't have any thanksgiving plans, because she wanted to see how her white blood cell count is and how she is feeling. She is feeling well today and notes that her port didn't work this morning. It was flushed with a thinner and will hopefully be working in time for infusion later today. Pt reports that she hasn't been doing a whole lot, but she has been cleaning her house and playing with her 37 year old granddaughter. She denies unusual headaches, visual changes, nausea, vomiting, or dizziness. There has been no unusual cough, phlegm production, or pleurisy. This been no change in bowel or bladder habits. She denies unexplained fatigue or unexplained weight loss, bleeding, rash, or fever. A detailed review of systems was otherwise entirely stable.    PAST MEDICAL HISTORY: Past Medical History:  Diagnosis Date  . Allergy   . DJD (degenerative joint disease), cervical 04 2002  . Edema    resolved  . Family history of colon cancer   . Family history of pancreatic cancer   . GERD (gastroesophageal reflux disease)   . Hyperlipidemia   . Hypothyroid   . Interstitial cystitis   . Leukopenia   . PONV (postoperative nausea and vomiting)   . Psoriasis     PAST SURGICAL HISTORY: Past Surgical History:  Procedure Laterality Date  . CHOLECYSTECTOMY    . COLONOSCOPY  3/09  . KNEE ARTHROSCOPY Right 1990  . KNEE ARTHROSCOPY Left 12/12/2010    (Dr. Noemi Chapel)  . TUBAL LIGATION  1983  . VARICOSE VEIN SURGERY  09/2009 R, 06/2010 L    FAMILY HISTORY Family  History  Problem Relation Age of Onset  . Hypertension Mother   . Heart disease Mother   . Gallbladder disease Mother   . Thyroid disease Mother   . Arthritis Mother   . Diabetes Father   . Cancer Father        pancreatic  . Hypertension Sister     . Gallbladder disease Sister   . Diabetes Sister   . Diverticulitis Sister        of small intestine  . Cancer Son        testicular cancer  . Stroke Sister        late 13's  . Hypertension Sister   . Pulmonary embolism Sister   . Ulcerative colitis Sister   . COPD Brother        had lung lobe removed for suspected cancer, but was benign; smoker  . Colon cancer Other 31  . Lung cancer Cousin        maternal first cousin - smoker  . Breast cancer Neg Hx   The patient's father died from pancreatic cancer at the age of 59. The patient's mother died from a myocardial infarction at age 62. The patient had one brother, 3 sisters. The patient's older son was diagnosed with testicular cancer at age 90. A maternal niece was diagnosed with colon cancer at age 68 and a maternal cousin with lung cancer at an unknown age.  GYNECOLOGIC HISTORY:  No LMP recorded. Patient is postmenopausal.  Menarche age 89, first live birth age 88, the patient is Mckenzie Webb P2. she went through the change of life approximately the year 2000. She did not take hormone replacement. She did take oral contraceptives for more than 20 years remotely, with no complications.  SOCIAL HISTORY:  She is a retired Web designer. Her husband Arnette Norris is an Clinical biochemist, still working part-time. Son Corene Cornea (from the patient's first marriage) lives in [place] and works in Therapist, art. Son Catalina Antigua (from patient second marriage) lives in [place] we are is Comptroller. The patient has one grandchild. She is not a Ambulance person.   ADVANCED DIRECTIVES: Not in place   HEALTH MAINTENANCE: Social History   Tobacco Use  . Smoking status: Never Smoker  . Smokeless tobacco: Never Used  Substance Use Topics  . Alcohol use: No    Comment: social, 1-2 times per year  . Drug use: No     Colonoscopy:2009/[facility] and  PAP:  [facility], T score -0.1 (normal)   Allergies  Allergen Reactions  .  Iodine Swelling  . Sulfa Antibiotics Swelling    Lips swell, itching  . Penicillins Rash    Has patient had a PCN reaction causing immediate rash, facial/tongue/throat swelling, SOB or lightheadedness with hypotension: No Has patient had a PCN reaction causing severe rash involving mucus membranes or skin necrosis: No Has patient had a PCN reaction that required hospitalization: No Has patient had a PCN reaction occurring within the last 10 years: No If all of the above answers are "NO", then may proceed with Cephalosporin use.   Newell Coral [Bacitracin-Polymyxin B] Swelling    Used for eye infection--got redder and worse  . Ciprofloxacin     After she takes for a while it gives her a "funny feeling."  . Codeine Nausea Only    Current Outpatient Medications  Medication Sig Dispense Refill  . Calcium Carbonate-Vitamin D (CALCIUM 600+D) 600-400 MG-UNIT tablet Take 1 tablet by mouth daily.    Marland Kitchen  Cholecalciferol (VITAMIN D) 1000 UNITS capsule Take 1,000 Units by mouth daily.      . clindamycin (CLINDAGEL) 1 % gel Apply topically 2 (two) times daily. 30 g 0  . Coenzyme Q10 (COQ10) 100 MG CAPS Take 100 mg by mouth daily.    Marland Kitchen dexamethasone (DECADRON) 4 MG tablet Take 2 tablets by mouth once a day on the day after chemotherapy and then take 2 tablets two times a day for 2 days. Take with food. 30 tablet 1  . diclofenac (VOLTAREN) 75 MG EC tablet Take 75 mg by mouth 2 (two) times daily.    Marland Kitchen levothyroxine (SYNTHROID, LEVOTHROID) 88 MCG tablet Take 1 tablet (88 mcg total) by mouth daily. (Patient taking differently: Take 88 mcg by mouth daily before breakfast. 30 minutes before eating anything) 90 tablet 3  . lidocaine-prilocaine (EMLA) cream Apply to affected area once 30 g 3  . loratadine (CLARITIN) 10 MG tablet Take 10 mg by mouth daily as needed for allergies.     Marland Kitchen LORazepam (ATIVAN) 0.5 MG tablet Take 0.5 mg by mouth at bedtime as needed.  0  . LORazepam (ATIVAN) 0.5 MG tablet Take 1 tablet  (0.5 mg total) by mouth every 6 (six) hours as needed (Nausea or vomiting). 30 tablet 0  . Multiple Vitamins-Minerals (ALIVE WOMENS 50+ PO) Take 1 tablet by mouth daily.     . Omega-3 Fatty Acids (FISH OIL) 1200 MG CAPS Take 2,400 mg by mouth at bedtime.    Marland Kitchen omeprazole (PRILOSEC) 40 MG capsule TAKE ONE (1) CAPSULE EACH DAY 90 capsule 0  . ondansetron (ZOFRAN) 8 MG tablet Take 1 tablet (8 mg total) by mouth 2 (two) times daily as needed. Start on the third day after chemotherapy. 30 tablet 1  . prochlorperazine (COMPAZINE) 10 MG tablet Take 1 tablet (10 mg total) by mouth every 6 (six) hours as needed (Nausea or vomiting). 90 tablet 1  . Red Yeast Rice Extract (RED YEAST RICE PO) Take 1,200 mg by mouth 2 (two) times daily.     Marland Kitchen venlafaxine XR (EFFEXOR-XR) 75 MG 24 hr capsule Take 1 capsule (75 mg total) by mouth daily with breakfast. 90 capsule 1  . vitamin B-12 (CYANOCOBALAMIN) 1000 MCG tablet Take 1,000 mcg by mouth daily.    Marland Kitchen anastrozole (ARIMIDEX) 1 MG tablet Take 1 tablet (1 mg total) by mouth daily. (Patient not taking: Reported on 06/16/2017) 90 tablet 4   No current facility-administered medications for this visit.     OBJECTIVE: Middle-aged white woman in no acute distress  Vitals:   06/30/17 1018  BP: (!) 168/76  Pulse: 69  Resp: 20  Temp: 98.1 F (36.7 C)  SpO2: 100%     Body mass index is 29.7 kg/m.   Wt Readings from Last 3 Encounters:  06/30/17 173 lb (78.5 kg)  06/16/17 172 lb 8 oz (78.2 kg)  06/09/17 174 lb (78.9 kg)   ECOG FS:1 - Symptomatic but completely ambulatory   Sclerae unicteric, EOMs intact Oropharynx clear and moist No cervical or supraclavicular adenopathy Lungs no rales or rhonchi Heart regular rate and rhythm Abd soft, nontender, positive bowel sounds MSK no focal spinal tenderness, no upper extremity lymphedema Neuro: nonfocal, well oriented, appropriate affect Breasts: Left breast is benign.  The right breast is status post lumpectomy and  axillary lymph node sampling.  The incisions are healing nicely.  The cosmetic result is excellent.  There is no evidence of local recurrence or residual disease.  Both  axillae are benign  LAB RESULTS:  CMP     Component Value Date/Time   NA 143 06/30/2017 0828   K 4.1 06/30/2017 0828   CL 100 (L) 05/02/2017 2301   CO2 27 06/30/2017 0828   GLUCOSE 85 06/30/2017 0828   BUN 9.7 06/30/2017 0828   CREATININE 0.8 06/30/2017 0828   CALCIUM 9.5 06/30/2017 0828   PROT 6.8 06/30/2017 0828   ALBUMIN 3.7 06/30/2017 0828   AST 23 06/30/2017 0828   ALT 23 06/30/2017 0828   ALKPHOS 61 06/30/2017 0828   BILITOT 0.43 06/30/2017 0828   GFRNONAA >60 05/02/2017 2301   GFRAA >60 05/02/2017 2301    No results found for: TOTALPROTELP, ALBUMINELP, A1GS, A2GS, BETS, BETA2SER, GAMS, MSPIKE, SPEI  No results found for: Nils Pyle, [facility]  Lab Results  Component Value Date   WBC 5.1 06/30/2017   NEUTROABS 3.3 06/30/2017   HGB 12.2 06/30/2017   HCT 36.3 06/30/2017   MCV 95.1 06/30/2017   PLT 236 06/30/2017      Chemistry      Component Value Date/Time   NA 143 06/30/2017 0828   K 4.1 06/30/2017 0828   CL 100 (L) 05/02/2017 2301   CO2 27 06/30/2017 0828   BUN 9.7 06/30/2017 0828   CREATININE 0.8 06/30/2017 0828      Component Value Date/Time   CALCIUM 9.5 06/30/2017 0828   ALKPHOS 61 06/30/2017 0828   AST 23 06/30/2017 0828   ALT 23 06/30/2017 0828   BILITOT 0.43 06/30/2017 0828       No results found for: LABCA2  No components found for: [id number]  No results for input(s): INR in the last 168 hours.  Urinalysis    Component Value Date/Time   COLORURINE YELLOW 05/02/2017 2201   APPEARANCEUR HAZY (A) 05/02/2017 2201   LABSPEC 1.014 05/02/2017 2201   PHURINE 6.0 05/02/2017 2201   GLUCOSEU NEGATIVE 05/02/2017 2201   HGBUR SMALL (A) 05/02/2017 2201   BILIRUBINUR NEGATIVE 05/02/2017 2201   BILIRUBINUR neg 08/17/2014 1123   KETONESUR NEGATIVE 05/02/2017  2201   PROTEINUR NEGATIVE 05/02/2017 2201   UROBILINOGEN negative 08/17/2014 1123   NITRITE POSITIVE (A) 05/02/2017 2201   LEUKOCYTESUR LARGE (A) 05/02/2017 2201     STUDIES: No results found.  ELIGIBLE FOR AVAILABLE RESEARCH PROTOCOL: no  ASSESSMENT: 71 y.o. [place] woman status post right breast upper inner quadrant biopsy 03/04/2015 for a clinical T1a N0, stage IA invasive ductal carcinoma, grade 1, estrogen receptor positive, progesterone receptor negative, with an MIB-1 of 3% and no HER-2 amplification  (1) anastrozole started neoadjuvantly 03/12/2017 in anticipation of possible surgical delays   (2) genetics testing 0 03/2017 through the  common hereditary cancer panel offered by [facility] found no deleterious mutations in APC, ATM, AXIN2, BARD1, BMPR1A, BRCA1, BRCA2, BRIP1, CDH1, CDKN2A (p14ARF), CDKN2A (p16INK4a), CHEK2, CTNNA1, DICER1, EPCAM (Deletion/duplication testing only), GREM1 (promoter region deletion/duplication testing only), KIT, MEN1, MLH1, MSH2, MSH3, MSH6, MUTYH, NBN, NF1, NHTL1, PALB2, PDGFRA, PMS2, POLD1, POLE, PTEN, RAD50, RAD51C, RAD51D, SDHB, SDHC, SDHD, SMAD4, SMARCA4. STK11, TP53, TSC1, TSC2, and VHL.  The following genes were evaluated for sequence changes only: SDHA and HOXB13 c.251G>A variant only.    (3) right lumpectomy and sentinel lymph node biopsy 03/26/2017 showed a pT1c pN1, stage IB invasive ductal carcinoma, grade 1, with negative margins  (4) Mammaprint returned high risk, indicating need for adjuvant chemotherapy  (5) chemotherapy consisting of Cytoxan and docetaxel given every 3 weeks 4 beginning on 04/28/2017  (a) changed  to Doxorubicin and Cyclophosphamide beginning 05/19/2017, to complete 3 cycles  (b) dose reduced by 20% with the second and third CA treatments  (6) adjuvant radiation to follow   (7) to continue anti-estrogens to a total of 5 years.  PLAN: Mckenzie Webb will complete her adjuvant chemotherapy today.  This is been a rough ride  for her and we have had to change the chemo and drop the doses but she is getting it done.  This is very favorable.  She will see me again next week chiefly to check counts.  I think she will be ready to see radiation oncology right after Thanksgiving's and hopefully get going with those treatments by mid December.  I have encouraged her to be as active as she can.  Over the holidays of course she does have to be very careful with handwashing in particular  She is interested in a flu shot.  Once her counts recover from the current treatment she may receive that here or at her primary  She will see me again in 1 week.  She knows to call for any other issues that may develop before then.  Ozetta Flatley, Virgie Dad, MD  06/30/17 10:35 AM Medical Oncology and Hematology [facility] [address] [place], [place] [address] Tel. [phone]    Fax. [phone]  This document serves as a record of services personally performed by Chauncey Cruel, MD. It was created on his behalf by Margit Banda, a trained medical scribe. The creation of this record is based on the scribe's personal observations and the provider's statements to them.   I have reviewed the above documentation for accuracy and completeness, and I agree with the above.

## 2017-06-30 ENCOUNTER — Other Ambulatory Visit (HOSPITAL_BASED_OUTPATIENT_CLINIC_OR_DEPARTMENT_OTHER): Payer: Medicare Other

## 2017-06-30 ENCOUNTER — Ambulatory Visit (HOSPITAL_BASED_OUTPATIENT_CLINIC_OR_DEPARTMENT_OTHER): Payer: Medicare Other | Admitting: Oncology

## 2017-06-30 ENCOUNTER — Encounter: Payer: Self-pay | Admitting: Oncology

## 2017-06-30 ENCOUNTER — Ambulatory Visit: Payer: Medicare Other

## 2017-06-30 ENCOUNTER — Ambulatory Visit (HOSPITAL_BASED_OUTPATIENT_CLINIC_OR_DEPARTMENT_OTHER): Payer: Medicare Other

## 2017-06-30 VITALS — BP 168/76 | HR 69 | Temp 98.1°F | Resp 20 | Ht 64.0 in | Wt 173.0 lb

## 2017-06-30 DIAGNOSIS — Z452 Encounter for adjustment and management of vascular access device: Secondary | ICD-10-CM | POA: Diagnosis not present

## 2017-06-30 DIAGNOSIS — C50211 Malignant neoplasm of upper-inner quadrant of right female breast: Secondary | ICD-10-CM

## 2017-06-30 DIAGNOSIS — Z170 Estrogen receptor positive status [ER+]: Secondary | ICD-10-CM

## 2017-06-30 DIAGNOSIS — I427 Cardiomyopathy due to drug and external agent: Secondary | ICD-10-CM

## 2017-06-30 DIAGNOSIS — Z5111 Encounter for antineoplastic chemotherapy: Secondary | ICD-10-CM | POA: Diagnosis not present

## 2017-06-30 DIAGNOSIS — Z79811 Long term (current) use of aromatase inhibitors: Secondary | ICD-10-CM | POA: Diagnosis not present

## 2017-06-30 DIAGNOSIS — T451X5A Adverse effect of antineoplastic and immunosuppressive drugs, initial encounter: Secondary | ICD-10-CM

## 2017-06-30 DIAGNOSIS — Z95828 Presence of other vascular implants and grafts: Secondary | ICD-10-CM

## 2017-06-30 LAB — CBC WITH DIFFERENTIAL/PLATELET
BASO%: 0.8 % (ref 0.0–2.0)
BASOS ABS: 0 10*3/uL (ref 0.0–0.1)
EOS%: 0.4 % (ref 0.0–7.0)
Eosinophils Absolute: 0 10*3/uL (ref 0.0–0.5)
HEMATOCRIT: 36.3 % (ref 34.8–46.6)
HEMOGLOBIN: 12.2 g/dL (ref 11.6–15.9)
LYMPH#: 0.8 10*3/uL — AB (ref 0.9–3.3)
LYMPH%: 16.4 % (ref 14.0–49.7)
MCH: 32.1 pg (ref 25.1–34.0)
MCHC: 33.7 g/dL (ref 31.5–36.0)
MCV: 95.1 fL (ref 79.5–101.0)
MONO#: 0.9 10*3/uL (ref 0.1–0.9)
MONO%: 18.1 % — ABNORMAL HIGH (ref 0.0–14.0)
NEUT#: 3.3 10*3/uL (ref 1.5–6.5)
NEUT%: 64.3 % (ref 38.4–76.8)
PLATELETS: 236 10*3/uL (ref 145–400)
RBC: 3.81 10*6/uL (ref 3.70–5.45)
RDW: 16.8 % — AB (ref 11.2–14.5)
WBC: 5.1 10*3/uL (ref 3.9–10.3)

## 2017-06-30 LAB — COMPREHENSIVE METABOLIC PANEL
ALT: 23 U/L (ref 0–55)
ANION GAP: 7 meq/L (ref 3–11)
AST: 23 U/L (ref 5–34)
Albumin: 3.7 g/dL (ref 3.5–5.0)
Alkaline Phosphatase: 61 U/L (ref 40–150)
BUN: 9.7 mg/dL (ref 7.0–26.0)
CO2: 27 meq/L (ref 22–29)
Calcium: 9.5 mg/dL (ref 8.4–10.4)
Chloride: 108 mEq/L (ref 98–109)
Creatinine: 0.8 mg/dL (ref 0.6–1.1)
GLUCOSE: 85 mg/dL (ref 70–140)
POTASSIUM: 4.1 meq/L (ref 3.5–5.1)
SODIUM: 143 meq/L (ref 136–145)
Total Bilirubin: 0.43 mg/dL (ref 0.20–1.20)
Total Protein: 6.8 g/dL (ref 6.4–8.3)

## 2017-06-30 MED ORDER — SODIUM CHLORIDE 0.9 % IV SOLN
Freq: Once | INTRAVENOUS | Status: AC
  Administered 2017-06-30: 11:00:00 via INTRAVENOUS

## 2017-06-30 MED ORDER — SODIUM CHLORIDE 0.9% FLUSH
10.0000 mL | INTRAVENOUS | Status: DC | PRN
  Administered 2017-06-30: 10 mL
  Filled 2017-06-30: qty 10

## 2017-06-30 MED ORDER — PALONOSETRON HCL INJECTION 0.25 MG/5ML
0.2500 mg | Freq: Once | INTRAVENOUS | Status: AC
  Administered 2017-06-30: 0.25 mg via INTRAVENOUS

## 2017-06-30 MED ORDER — SODIUM CHLORIDE 0.9% FLUSH
10.0000 mL | Freq: Once | INTRAVENOUS | Status: AC
  Administered 2017-06-30: 10 mL
  Filled 2017-06-30: qty 10

## 2017-06-30 MED ORDER — PEGFILGRASTIM 6 MG/0.6ML ~~LOC~~ PSKT
6.0000 mg | PREFILLED_SYRINGE | Freq: Once | SUBCUTANEOUS | Status: AC
  Administered 2017-06-30: 6 mg via SUBCUTANEOUS
  Filled 2017-06-30: qty 0.6

## 2017-06-30 MED ORDER — PALONOSETRON HCL INJECTION 0.25 MG/5ML
INTRAVENOUS | Status: AC
  Filled 2017-06-30: qty 5

## 2017-06-30 MED ORDER — SODIUM CHLORIDE 0.9 % IV SOLN
Freq: Once | INTRAVENOUS | Status: AC
  Administered 2017-06-30: 11:00:00 via INTRAVENOUS
  Filled 2017-06-30: qty 5

## 2017-06-30 MED ORDER — ALTEPLASE 2 MG IJ SOLR
2.0000 mg | Freq: Once | INTRAMUSCULAR | Status: AC
  Administered 2017-06-30: 2 mg
  Filled 2017-06-30: qty 2

## 2017-06-30 MED ORDER — DOXORUBICIN HCL CHEMO IV INJECTION 2 MG/ML
48.0000 mg/m2 | Freq: Once | INTRAVENOUS | Status: AC
  Administered 2017-06-30: 90 mg via INTRAVENOUS
  Filled 2017-06-30: qty 45

## 2017-06-30 MED ORDER — SODIUM CHLORIDE 0.9 % IV SOLN
480.0000 mg/m2 | Freq: Once | INTRAVENOUS | Status: AC
  Administered 2017-06-30: 900 mg via INTRAVENOUS
  Filled 2017-06-30: qty 45

## 2017-06-30 MED ORDER — HEPARIN SOD (PORK) LOCK FLUSH 100 UNIT/ML IV SOLN
500.0000 [IU] | Freq: Once | INTRAVENOUS | Status: AC | PRN
  Administered 2017-06-30: 500 [IU]
  Filled 2017-06-30: qty 5

## 2017-06-30 NOTE — Patient Instructions (Signed)
[facility] Discharge Instructions for Patients Receiving Chemotherapy  Today you received the following chemotherapy agents Adriamycin and Cytoxan  To help prevent nausea and vomiting after your treatment, we encourage you to take your nausea medication as directed   If you develop nausea and vomiting that is not controlled by your nausea medication, call the clinic.   BELOW ARE SYMPTOMS THAT SHOULD BE REPORTED IMMEDIATELY:  *FEVER GREATER THAN 100.5 F  *CHILLS WITH OR WITHOUT FEVER  NAUSEA AND VOMITING THAT IS NOT CONTROLLED WITH YOUR NAUSEA MEDICATION  *UNUSUAL SHORTNESS OF BREATH  *UNUSUAL BRUISING OR BLEEDING  TENDERNESS IN MOUTH AND THROAT WITH OR WITHOUT PRESENCE OF ULCERS  *URINARY PROBLEMS  *BOWEL PROBLEMS  UNUSUAL RASH Items with * indicate a potential emergency and should be followed up as soon as possible.  Feel free to call the clinic should you have any questions or concerns. The clinic phone number is [phone].  Please show the [place] at check-in to the Emergency Department and triage nurse.

## 2017-06-30 NOTE — Progress Notes (Signed)
Met with patient and spouse after appointments today. Advised patient about Duanne Limerick and [employer]. Patient states she is interested in applying for [employer]. Patient game me verbal income. Approved patient for one-time $1000 grant based on need regarding transportation assistance. Patient will bring hard copy of income at her next visit. Patient has 2 insurance therefore no other needs regarding copay. Patient has my card for any other financial questions or concerns. Patient has a copy of the approval letter as well as the expense sheet along with the outpatient pharmacy information. Patient received a gas card today from her grant.

## 2017-06-30 NOTE — Progress Notes (Signed)
Port-A-Cath accessed by LNP. No blood return noted. Site flushes well with NS. No swelling or pain noted upon flushing. Cath-Flo instilled per policy, site marked, will monitor for blood return.

## 2017-07-01 NOTE — Progress Notes (Signed)
[place]  Telephone:([phone] Fax:(336) [phone]     ID: Mckenzie Webb DOB: [birth year]  MR#: [id number]  BZJ#:[id number]  Patient Care Team: Rita Ohara, MD as PCP - General (Family Medicine) Jovita Kussmaul, MD as Consulting Physician (General Surgery) Santino Kinsella, Virgie Dad, MD as Consulting Physician (Oncology) Eppie Gibson, MD as Attending Physician (Radiation Oncology) Juanita Craver, MD as Consulting Physician (Gastroenterology) Vania Rea, MD as Consulting Physician (Obstetrics and Gynecology) Elsie Saas, MD as Consulting Physician (Orthopedic Surgery) Martinique, Amy, MD as Consulting Physician (Dermatology) OTHER MD:  CHIEF COMPLAINT: Estrogen receptor positive breast cancer  CURRENT TREATMENT: Adjuvant radiation pending  BREAST CANCER HISTORY: From the original intake note:  "Mckenzie Webb" had bilateral screening mammography with tomography at the [facility] 02/21/2017. This showed a possible mass in the right breast. Right diagnostic mammography with tomography and ultrasonography 02/27/2017 found a breast density to be category B. In the subareolar right breast there was a 0.5 cm spiculated mass, which was not palpable. There was mild right nipple inversion, which per report was chronic. Ultrasonography confirmed a 0.5 cm retroareolar breast mass at the 1:00 radiant. The right axilla was sonographically benign.  On 03/03/2017 the patient underwent biopsy of the right breast mass in question. This showed (SAA [phone]) and invasive ductal carcinoma, grade 1, estrogen receptor 100% positive, progesterone receptor negative, with an MIB-1 of 3%, and no HER-2 implication, the signals ratio being 1.25 and the number per cell 2.00.  The patient's subsequent history is as detailed below.  INTERVAL HISTORY: Mckenzie Webb returns today for follow-up and treatment of her estrogen receptor positive breast cancer.  She completed her adjuvant chemotherapy 06/30/2017, receiving 1  cycle of cyclophosphamide/docetaxel and 3 cycles of cyclophosphamide/doxorubicin.  Today is day 7 cycle 3 of CA chemotherapy, which she tolerated better than the earlier ones, although she still feels weak and a little bit shaky.  She has not gone shopping.  She has had no fever or bleeding however.  She is keeping herself well-hydrated.   REVIEW OF SYSTEMS: Mckenzie Webb has had some altered taste, and a little bit less appetite than usual but overall she is doing fair.  There have been no headaches vision changes or mouth sores.  She still has watery eyes at times.  There is been no diarrhea no change in bowel habits no change in bladder habits and no dysuria.  A detailed review of systems today was otherwise stable  PAST MEDICAL HISTORY: Past Medical History:  Diagnosis Date  . Allergy   . DJD (degenerative joint disease), cervical 04 2002  . Edema    resolved  . Family history of colon cancer   . Family history of pancreatic cancer   . GERD (gastroesophageal reflux disease)   . Hyperlipidemia   . Hypothyroid   . Interstitial cystitis   . Leukopenia   . PONV (postoperative nausea and vomiting)   . Psoriasis     PAST SURGICAL HISTORY: Past Surgical History:  Procedure Laterality Date  . BREAST LUMPECTOMY WITH RADIOACTIVE SEED AND SENTINEL LYMPH NODE BIOPSY Right 03/26/2017   Performed by Jovita Kussmaul, MD at [facility]  . CHOLECYSTECTOMY    . COLONOSCOPY  3/09  . INSERTION PORT-A-CATH Left 04/24/2017   Performed by Jovita Kussmaul, MD at [facility]  . KNEE ARTHROSCOPY Right 1990  . KNEE ARTHROSCOPY Left 12/12/2010    (Dr. Noemi Chapel)  . TUBAL LIGATION  1983  . VARICOSE VEIN SURGERY  09/2009 R, 06/2010 L  FAMILY HISTORY Family History  Problem Relation Age of Onset  . Hypertension Mother   . Heart disease Mother   . Gallbladder disease Mother   . Thyroid disease Mother   . Arthritis Mother   . Diabetes Father   . Cancer Father        pancreatic  . Hypertension Sister     . Gallbladder disease Sister   . Diabetes Sister   . Diverticulitis Sister        of small intestine  . Cancer Son        testicular cancer  . Stroke Sister        late 36's  . Hypertension Sister   . Pulmonary embolism Sister   . Ulcerative colitis Sister   . COPD Brother        had lung lobe removed for suspected cancer, but was benign; smoker  . Colon cancer Other 36  . Lung cancer Cousin        maternal first cousin - smoker  . Breast cancer Neg Hx   The patient's father died from pancreatic cancer at the age of 29. The patient's mother died from a myocardial infarction at age 58. The patient had one brother, 3 sisters. The patient's older son was diagnosed with testicular cancer at age 76. A maternal niece was diagnosed with colon cancer at age 42 and a maternal cousin with lung cancer at an unknown age.  GYNECOLOGIC HISTORY:  No LMP recorded. Patient is postmenopausal.  Menarche age 33, first live birth age 7, the patient is [place] P2. she went through the change of life approximately the year 2000. She did not take hormone replacement. She did take oral contraceptives for more than 20 years remotely, with no complications.  SOCIAL HISTORY:  She is a retired Web designer. Her husband Arnette Norris is an Clinical biochemist, still working part-time. Son Corene Cornea (from the patient's first marriage) lives in [place] and works in Therapist, art. Son Catalina Antigua (from patient second marriage) lives in [place] we are is Comptroller. The patient has one grandchild. She is not a Ambulance person.   ADVANCED DIRECTIVES: Not in place   HEALTH MAINTENANCE: Social History   Tobacco Use  . Smoking status: Never Smoker  . Smokeless tobacco: Never Used  Substance Use Topics  . Alcohol use: No    Comment: social, 1-2 times per year  . Drug use: No     Colonoscopy:2009/[facility] and  PAP:  [facility], T score -0.1 (normal)   Allergies  Allergen Reactions  .  Iodine Swelling  . Sulfa Antibiotics Swelling    Lips swell, itching  . Penicillins Rash    Has patient had a PCN reaction causing immediate rash, facial/tongue/throat swelling, SOB or lightheadedness with hypotension: No Has patient had a PCN reaction causing severe rash involving mucus membranes or skin necrosis: No Has patient had a PCN reaction that required hospitalization: No Has patient had a PCN reaction occurring within the last 10 years: No If all of the above answers are "NO", then may proceed with Cephalosporin use.   Newell Coral [Bacitracin-Polymyxin B] Swelling    Used for eye infection--got redder and worse  . Ciprofloxacin     After she takes for a while it gives her a "funny feeling."  . Codeine Nausea Only    Current Outpatient Medications  Medication Sig Dispense Refill  . Calcium Carbonate-Vitamin D (CALCIUM 600+D) 600-400 MG-UNIT tablet Take 1 tablet by mouth daily.    Marland Kitchen  cephALEXin (KEFLEX) 500 MG capsule Take 1 capsule (500 mg total) 2 (two) times daily by mouth. 10 capsule 0  . Cholecalciferol (VITAMIN D) 1000 UNITS capsule Take 1,000 Units by mouth daily.      . Coenzyme Q10 (COQ10) 100 MG CAPS Take 100 mg by mouth daily.    Marland Kitchen levothyroxine (SYNTHROID, LEVOTHROID) 88 MCG tablet Take 1 tablet (88 mcg total) by mouth daily. (Patient taking differently: Take 88 mcg by mouth daily before breakfast. 30 minutes before eating anything) 90 tablet 3  . lidocaine-prilocaine (EMLA) cream Apply to affected area once 30 g 3  . loratadine (CLARITIN) 10 MG tablet Take 10 mg by mouth daily as needed for allergies.     . Multiple Vitamins-Minerals (ALIVE WOMENS 50+ PO) Take 1 tablet by mouth daily.     . Omega-3 Fatty Acids (FISH OIL) 1200 MG CAPS Take 2,400 mg by mouth at bedtime.    Marland Kitchen omeprazole (PRILOSEC) 40 MG capsule TAKE ONE (1) CAPSULE EACH DAY 90 capsule 0  . Red Yeast Rice Extract (RED YEAST RICE PO) Take 1,200 mg by mouth 2 (two) times daily.     Marland Kitchen venlafaxine XR  (EFFEXOR-XR) 75 MG 24 hr capsule Take 1 capsule (75 mg total) by mouth daily with breakfast. 90 capsule 1  . vitamin B-12 (CYANOCOBALAMIN) 1000 MCG tablet Take 1,000 mcg by mouth daily.    Marland Kitchen anastrozole (ARIMIDEX) 1 MG tablet Take 1 tablet (1 mg total) by mouth daily. (Patient not taking: Reported on 06/16/2017) 90 tablet 4   No current facility-administered medications for this visit.     OBJECTIVE: Middle-aged white woman appears stated age  71:   07/07/17 1224  BP: (!) 176/79  Pulse: 66  Resp: 20  Temp: (!) 97.4 F (36.3 C)  SpO2: 100%     Body mass index is 30 kg/m.   Wt Readings from Last 3 Encounters:  07/07/17 174 lb 12.8 oz (79.3 kg)  06/30/17 173 lb (78.5 kg)  06/16/17 172 lb 8 oz (78.2 kg)   ECOG FS:1 - Symptomatic but completely ambulatory   Sclerae unicteric, pupils round and equal Oropharynx clear and moist No cervical or supraclavicular adenopathy Lungs no rales or rhonchi Heart regular rate and rhythm Abd soft, nontender, positive bowel sounds MSK no focal spinal tenderness, no upper extremity lymphedema Neuro: nonfocal, well oriented, appropriate affect Breasts: Right breast is unremarkable.  The left breast is status post lumpectomy with no evidence of local recurrence.  Both axillae are benign.  LAB RESULTS:  CMP     Component Value Date/Time   NA 143 06/30/2017 0828   K 4.1 06/30/2017 0828   CL 100 (L) 05/02/2017 2301   CO2 27 06/30/2017 0828   GLUCOSE 85 06/30/2017 0828   BUN 9.7 06/30/2017 0828   CREATININE 0.8 06/30/2017 0828   CALCIUM 9.5 06/30/2017 0828   PROT 6.8 06/30/2017 0828   ALBUMIN 3.7 06/30/2017 0828   AST 23 06/30/2017 0828   ALT 23 06/30/2017 0828   ALKPHOS 61 06/30/2017 0828   BILITOT 0.43 06/30/2017 0828   GFRNONAA >60 05/02/2017 2301   GFRAA >60 05/02/2017 2301    No results found for: TOTALPROTELP, ALBUMINELP, A1GS, A2GS, BETS, BETA2SER, GAMS, MSPIKE, SPEI  No results found for: KPAFRELGTCHN, LAMBDASER,  KAPLAMBRATIO  Lab Results  Component Value Date   WBC 0.8 (LL) 07/07/2017   NEUTROABS 0.2 (LL) 07/07/2017   HGB 11.1 (L) 07/07/2017   HCT 32.9 (L) 07/07/2017   MCV 95.6 07/07/2017  PLT 94 (L) 07/07/2017      Chemistry      Component Value Date/Time   NA 143 06/30/2017 0828   K 4.1 06/30/2017 0828   CL 100 (L) 05/02/2017 2301   CO2 27 06/30/2017 0828   BUN 9.7 06/30/2017 0828   CREATININE 0.8 06/30/2017 0828      Component Value Date/Time   CALCIUM 9.5 06/30/2017 0828   ALKPHOS 61 06/30/2017 0828   AST 23 06/30/2017 0828   ALT 23 06/30/2017 0828   BILITOT 0.43 06/30/2017 0828       No results found for: LABCA2  No components found for: [id number]  No results for input(s): INR in the last 168 hours.  Urinalysis    Component Value Date/Time   COLORURINE YELLOW 05/02/2017 2201   APPEARANCEUR HAZY (A) 05/02/2017 2201   LABSPEC 1.014 05/02/2017 2201   PHURINE 6.0 05/02/2017 2201   GLUCOSEU NEGATIVE 05/02/2017 2201   HGBUR SMALL (A) 05/02/2017 2201   BILIRUBINUR NEGATIVE 05/02/2017 2201   BILIRUBINUR neg 08/17/2014 1123   KETONESUR NEGATIVE 05/02/2017 2201   PROTEINUR NEGATIVE 05/02/2017 2201   UROBILINOGEN negative 08/17/2014 1123   NITRITE POSITIVE (A) 05/02/2017 2201   LEUKOCYTESUR LARGE (A) 05/02/2017 2201     STUDIES: No results found.  ELIGIBLE FOR AVAILABLE RESEARCH PROTOCOL: no  ASSESSMENT: 71 y.o. [place] woman status post right breast upper inner quadrant biopsy 03/04/2015 for a clinical T1a N0, stage IA invasive ductal carcinoma, grade 1, estrogen receptor positive, progesterone receptor negative, with an MIB-1 of 3% and no HER-2 amplification  (1) anastrozole started neoadjuvantly 03/12/2017, held at the start of chemotherapy, resumed late November 2018  (2) genetics testing 0 03/2017 through the  common hereditary cancer panel offered by [facility] found no deleterious mutations in APC, ATM, AXIN2, BARD1, BMPR1A, BRCA1, BRCA2, BRIP1, CDH1, CDKN2A  (p14ARF), CDKN2A (p16INK4a), CHEK2, CTNNA1, DICER1, EPCAM (Deletion/duplication testing only), GREM1 (promoter region deletion/duplication testing only), KIT, MEN1, MLH1, MSH2, MSH3, MSH6, MUTYH, NBN, NF1, NHTL1, PALB2, PDGFRA, PMS2, POLD1, POLE, PTEN, RAD50, RAD51C, RAD51D, SDHB, SDHC, SDHD, SMAD4, SMARCA4. STK11, TP53, TSC1, TSC2, and VHL.  The following genes were evaluated for sequence changes only: SDHA and HOXB13 c.251G>A variant only.    (3) right lumpectomy and sentinel lymph node biopsy 03/26/2017 showed a pT1c pN1, stage IB invasive ductal carcinoma, grade 1, with negative margins  (4) Mammaprint returned high risk, indicating need for adjuvant chemotherapy  (5) chemotherapy consisting of Cytoxan and docetaxel given every 3 weeks 4 beginning on 04/28/2017  (a) changed to Doxorubicin and Cyclophosphamide beginning 05/19/2017, completing 3 cycles 06/30/2017  (b) dose reduced by 20% with the second and third CA treatments  (6) adjuvant radiation to follow   (7) to continue anti-estrogens to a total of 5 years.  PLAN: Mckenzie Webb has completed her adjuvant chemotherapy and now is ready to start radiation.  She will see Dr. Isidore Moos within the next 2 weeks.  Mckenzie Webb is also ready to have her port removed.  I have sent a note to Dr. Marlou Starks to try to get that scheduled.  Her ANC is 200.  I am starting her on cephalexin, which she has tolerated well previously.  She will take that for the next 3 days at a minimum.  She will see [place] again second week in December and we will check counts then.  After that she will see me once her radiation treatments have been completed  She will start her anastrozole again next week.  The plan will  be to continue that for a total of 5 years  She knows to call for any other issues that may develop before then  Earsel Shouse, Virgie Dad, MD  07/07/17 12:44 PM Medical Oncology and Hematology [facility] [place], [place] [address] Tel.  [phone]    Fax. [phone]  This document serves as a record of services personally performed by Lurline Del, MD. It was created on his behalf by Sheron Nightingale, a trained medical scribe. The creation of this record is based on the scribe's personal observations and the provider's statements to them.   I have reviewed the above documentation for accuracy and completeness, and I agree with the above.

## 2017-07-04 ENCOUNTER — Encounter: Payer: Self-pay | Admitting: *Deleted

## 2017-07-04 ENCOUNTER — Other Ambulatory Visit: Payer: Self-pay | Admitting: *Deleted

## 2017-07-04 DIAGNOSIS — C50211 Malignant neoplasm of upper-inner quadrant of right female breast: Secondary | ICD-10-CM

## 2017-07-04 DIAGNOSIS — Z170 Estrogen receptor positive status [ER+]: Principal | ICD-10-CM

## 2017-07-07 ENCOUNTER — Ambulatory Visit (HOSPITAL_BASED_OUTPATIENT_CLINIC_OR_DEPARTMENT_OTHER): Payer: Medicare Other

## 2017-07-07 ENCOUNTER — Telehealth: Payer: Self-pay | Admitting: Oncology

## 2017-07-07 ENCOUNTER — Other Ambulatory Visit (HOSPITAL_BASED_OUTPATIENT_CLINIC_OR_DEPARTMENT_OTHER): Payer: Medicare Other

## 2017-07-07 ENCOUNTER — Ambulatory Visit (HOSPITAL_BASED_OUTPATIENT_CLINIC_OR_DEPARTMENT_OTHER): Payer: Medicare Other | Admitting: Oncology

## 2017-07-07 VITALS — BP 176/79 | HR 66 | Temp 97.4°F | Resp 20 | Ht 64.0 in | Wt 174.8 lb

## 2017-07-07 DIAGNOSIS — T451X5A Adverse effect of antineoplastic and immunosuppressive drugs, initial encounter: Secondary | ICD-10-CM

## 2017-07-07 DIAGNOSIS — Z79811 Long term (current) use of aromatase inhibitors: Secondary | ICD-10-CM | POA: Diagnosis not present

## 2017-07-07 DIAGNOSIS — D72819 Decreased white blood cell count, unspecified: Secondary | ICD-10-CM

## 2017-07-07 DIAGNOSIS — D709 Neutropenia, unspecified: Secondary | ICD-10-CM | POA: Diagnosis not present

## 2017-07-07 DIAGNOSIS — C50211 Malignant neoplasm of upper-inner quadrant of right female breast: Secondary | ICD-10-CM | POA: Diagnosis not present

## 2017-07-07 DIAGNOSIS — Z170 Estrogen receptor positive status [ER+]: Secondary | ICD-10-CM

## 2017-07-07 DIAGNOSIS — Z95828 Presence of other vascular implants and grafts: Secondary | ICD-10-CM

## 2017-07-07 DIAGNOSIS — I427 Cardiomyopathy due to drug and external agent: Secondary | ICD-10-CM

## 2017-07-07 HISTORY — DX: Decreased white blood cell count, unspecified: D72.819

## 2017-07-07 LAB — CBC WITH DIFFERENTIAL/PLATELET
BASO%: 1.2 % (ref 0.0–2.0)
Basophils Absolute: 0 10*3/uL (ref 0.0–0.1)
EOS%: 4.7 % (ref 0.0–7.0)
Eosinophils Absolute: 0 10*3/uL (ref 0.0–0.5)
HEMATOCRIT: 32.9 % — AB (ref 34.8–46.6)
HGB: 11.1 g/dL — ABNORMAL LOW (ref 11.6–15.9)
LYMPH#: 0.4 10*3/uL — AB (ref 0.9–3.3)
LYMPH%: 55.5 % — ABNORMAL HIGH (ref 14.0–49.7)
MCH: 32.3 pg (ref 25.1–34.0)
MCHC: 33.7 g/dL (ref 31.5–36.0)
MCV: 95.6 fL (ref 79.5–101.0)
MONO#: 0.1 10*3/uL (ref 0.1–0.9)
MONO%: 12.5 % (ref 0.0–14.0)
NEUT#: 0.2 10*3/uL — CL (ref 1.5–6.5)
NEUT%: 26.1 % — AB (ref 38.4–76.8)
Platelets: 94 10*3/uL — ABNORMAL LOW (ref 145–400)
RBC: 3.44 10*6/uL — AB (ref 3.70–5.45)
RDW: 16.3 % — ABNORMAL HIGH (ref 11.2–14.5)
WBC: 0.8 10*3/uL — CL (ref 3.9–10.3)

## 2017-07-07 LAB — COMPREHENSIVE METABOLIC PANEL
ALBUMIN: 3.4 g/dL — AB (ref 3.5–5.0)
ALK PHOS: 68 U/L (ref 40–150)
ALT: 17 U/L (ref 0–55)
ANION GAP: 8 meq/L (ref 3–11)
AST: 10 U/L (ref 5–34)
BILIRUBIN TOTAL: 0.56 mg/dL (ref 0.20–1.20)
BUN: 14.3 mg/dL (ref 7.0–26.0)
CO2: 27 mEq/L (ref 22–29)
CREATININE: 0.7 mg/dL (ref 0.6–1.1)
Calcium: 8.8 mg/dL (ref 8.4–10.4)
Chloride: 103 mEq/L (ref 98–109)
GLUCOSE: 81 mg/dL (ref 70–140)
Potassium: 3.8 mEq/L (ref 3.5–5.1)
SODIUM: 138 meq/L (ref 136–145)
TOTAL PROTEIN: 6.1 g/dL — AB (ref 6.4–8.3)

## 2017-07-07 MED ORDER — SODIUM CHLORIDE 0.9% FLUSH
10.0000 mL | Freq: Once | INTRAVENOUS | Status: AC
  Administered 2017-07-07: 10 mL
  Filled 2017-07-07: qty 10

## 2017-07-07 MED ORDER — CEPHALEXIN 500 MG PO CAPS: 500.0000 mg | ORAL_CAPSULE | Freq: Two times a day (BID) | ORAL | 0 refills | Status: DC

## 2017-07-07 MED ORDER — HEPARIN SOD (PORK) LOCK FLUSH 100 UNIT/ML IV SOLN
500.0000 [IU] | Freq: Once | INTRAVENOUS | Status: AC
  Administered 2017-07-07: 500 [IU]
  Filled 2017-07-07: qty 5

## 2017-07-07 NOTE — Progress Notes (Signed)
Location of Breast Cancer: Right Breast  Histology per Pathology Report:  03/03/17 Diagnosis Breast, right, needle core biopsy, 1:00 o'clock - INVASIVE DUCTAL CARCINOMA. - PERINEURAL INVASION IS IDENTIFIED.  Receptor Status: ER(100%), PR (NEG), Her2-neu (NEG), Ki-(3%)  03/26/17 Diagnosis 1. Lymph node, sentinel, biopsy, Right Axillary #1 METASTATIC DUCTAL CARCINOMA (1 MM, 1/1) 2. Lymph node, sentinel, biopsy, Right Axillary #2 ONE BENIGN LYMPH NODE (0/1) 3. Breast, lumpectomy, Right w/seed INVASIVE DUCTAL CARCINOMA, GRADE 1, SPANNING 1.2 CM DUCTAL CARCINOMA IN SITU, GRADE 1 ALL MARGINS OF RESECTION ARE NEGATIVE FOR CARCINOMA  Did patient present with symptoms or was this found on screening mammography?: It was found on a screening mammogram.   Past/Anticipated interventions by surgeon, if any: 03/26/17 PROCEDURE:  Procedure(s): BREAST LUMPECTOMY WITH RADIOACTIVE SEED AND DEEP RIGHT AXILLARY SENTINEL LYMPH NODE BIOPSY (Right) SURGEON:  Surgeon(s) and Role:    Jovita Kussmaul, MD - Primary   04/24/17 PROCEDURE:  Procedure(s): INSERTION PORT-A-CATH (Left) SURGEON:  Surgeon(s) and Role:    Jovita Kussmaul, MD - Primary   Past/Anticipated interventions by medical oncology, if any:  Dr. [name] 07/07/17 Chemotherapy consisting of Cytoxan and docetaxel given every 3 weeks 4 beginning on 04/28/2017             (a) changed to Doxorubicin and Cyclophosphamide beginning 05/19/2017, completing 3 cycles 06/30/2017             (b) dose reduced by 20% with the second and third CA treatments PLAN: -Mckenzie Webb has completed her adjuvant chemotherapy and now is ready to start radiation.  She will see Dr. Isidore Moos within the next 2 weeks. Mckenzie Webb is also ready to have her port removed.  I have sent a note to Dr. Marlou Starks to try to get that scheduled. -[name] is 200.  I am starting her on cephalexin, which she has tolerated well previously.  She will take that for the next 3 days at a minimum.  She  will see [place] again second week in December and we will check counts then.  After that she will see me once her radiation treatments have been completed  -She will start her anastrozole again next week.  The plan will be to continue that for a total of 5 years  Lymphedema issues, if any:  She denies. She has good arm mobility  Pain issues, if any:  No  SAFETY ISSUES:  Prior radiation? No  Pacemaker/ICD? No  Possible current pregnancy? No  Is the patient on methotrexate? No  Current Complaints / other details:    BP 124/73   Pulse 96   Temp 98.2 F (36.8 C)   Ht 5' 4"  (1.626 m)   Wt 174 lb 3.2 oz (79 kg)   SpO2 99% Comment: room air  BMI 29.90 kg/m    Wt Readings from Last 3 Encounters:  07/15/17 174 lb 3.2 oz (79 kg)  07/07/17 174 lb 12.8 oz (79.3 kg)  06/30/17 173 lb (78.5 kg)      Nechemia Chiappetta, Stephani Police, RN 07/07/2017,3:47 PM

## 2017-07-07 NOTE — Progress Notes (Signed)
Port flushed several times with no blood return. Labs drawn peripherally. Pt stated "I'm just going to have it taken out." [employer] LPN

## 2017-07-07 NOTE — Telephone Encounter (Signed)
Scheduled appt per 11/19 los - Gave patient AVS and calender per los.

## 2017-07-09 ENCOUNTER — Ambulatory Visit: Payer: Self-pay | Admitting: General Surgery

## 2017-07-15 ENCOUNTER — Ambulatory Visit
Admission: RE | Admit: 2017-07-15 | Discharge: 2017-07-15 | Disposition: A | Discharge status: Home / Self Care | Payer: Medicare Other | Source: Ambulatory Visit | Attending: Radiation Oncology | Admitting: Radiation Oncology

## 2017-07-15 ENCOUNTER — Encounter: Payer: Self-pay | Admitting: Radiation Oncology

## 2017-07-15 VITALS — BP 124/73 | HR 96 | Temp 98.2°F | Ht 64.0 in | Wt 174.2 lb

## 2017-07-15 DIAGNOSIS — Z170 Estrogen receptor positive status [ER+]: Secondary | ICD-10-CM | POA: Insufficient documentation

## 2017-07-15 DIAGNOSIS — Z9889 Other specified postprocedural states: Secondary | ICD-10-CM | POA: Diagnosis not present

## 2017-07-15 DIAGNOSIS — C50211 Malignant neoplasm of upper-inner quadrant of right female breast: Secondary | ICD-10-CM | POA: Diagnosis not present

## 2017-07-15 DIAGNOSIS — C773 Secondary and unspecified malignant neoplasm of axilla and upper limb lymph nodes: Secondary | ICD-10-CM | POA: Diagnosis not present

## 2017-07-15 NOTE — Progress Notes (Signed)
Radiation Oncology         [phone] ________________________________  Name: Mckenzie Webb MRN: [id number]  Date: 07/15/2017  DOB: 1946/07/12  Follow-Up Visit Note  Outpatient  CC: Rita Ohara, MD  Magrinat, Virgie Dad, MD  Diagnosis:      ICD-10-CM   1. Malignant neoplasm of upper-inner quadrant of right breast in female, estrogen receptor positive ([place]) C50.211    Z17.0    Cancer Staging Malignant neoplasm of upper-inner quadrant of right breast in female, estrogen receptor positive ([place]) Staging form: Breast, AJCC 8th Edition - Clinical stage from 03/12/2017: Stage IA (cT1a, cN0, cM0, G1, ER: Positive, PR: Positive, HER2: Negative) - Unsigned Staging comments: Staged at breast conference on 7.25.18 - Pathologic: Stage IA (pT1c, pN50m, cM0, G1, ER: Positive, PR: Negative, HER2: Negative) - Signed by SEppie Gibson MD on 07/15/2017   CHIEF COMPLAINT: Here to discuss management of her right breast cancer  Narrative:  The patient returns today for follow-up.     Since consultation, she underwent lumpectomy of right breast mass with sentinel node study on 03/26/17 by Dr. TMarlou Starks Pathology showed invasive ductal carcinoma, Grade I, spanning 1.2 cm and DCIS, Grade I, ER 100%, PR negative, Her2 negative. All margins were negative for malignancy. Invasive carcinoma was 0.6 cm from nearest margin (lateral) and DCIS was 1.0 cm from nearest margin (lateral). 1/2 sentinel nodes were positive for metastasis, with a micromet. Mammaprint returned high risk, indicating need for adjuvant chemotherapy.  The patient completed chemotherapy on 07/07/17 and has been placed on anastrozole under the supervision of Dr. [name]   Dr. MJana Hakimhas kindly referred her to radiation oncology for potential radiation therapy.  On review of systems, the patient states her first infusion resulted in a reaction where she "hurt all the way around her chest." She was switched to another medication and tolerated  it better. Endorses eyes watering but wants to give this a few weeks before seeing ENT. Patient endorses infection post-op, during chemo (several weeks ago), near surgery site that was treated with IV abx for 6 weeks followed by abx PO and ointment. This took weeks to heal over her central R breast.  She states it did not involve portacath.             ALLERGIES:  is allergic to iodine; sulfa antibiotics; penicillins; polysporin [bacitracin-polymyxin b]; ciprofloxacin; and codeine.  Meds: Current Outpatient Medications  Medication Sig Dispense Refill  . anastrozole (ARIMIDEX) 1 MG tablet Take 1 tablet (1 mg total) by mouth daily. 90 tablet 4  . Calcium Carbonate-Vitamin D (CALCIUM 600+D) 600-400 MG-UNIT tablet Take 1 tablet by mouth daily.    . Cholecalciferol (VITAMIN D) 1000 UNITS capsule Take 1,000 Units by mouth daily.      . Coenzyme Q10 (COQ10) 100 MG CAPS Take 100 mg by mouth daily.    .Marland Kitchenlevothyroxine (SYNTHROID, LEVOTHROID) 88 MCG tablet Take 1 tablet (88 mcg total) by mouth daily. (Patient taking differently: Take 88 mcg by mouth daily before breakfast. 30 minutes before eating anything) 90 tablet 3  . loratadine (CLARITIN) 10 MG tablet Take 10 mg by mouth daily as needed for allergies.     . Multiple Vitamins-Minerals (ALIVE WOMENS 50+ PO) Take 1 tablet by mouth daily.     . Omega-3 Fatty Acids (FISH OIL) 1200 MG CAPS Take 2,400 mg by mouth at bedtime.    .Marland Kitchenomeprazole (PRILOSEC) 40 MG capsule TAKE ONE (1) CAPSULE EACH DAY 90 capsule 0  . Red  Yeast Rice Extract (RED YEAST RICE PO) Take 1,200 mg by mouth 2 (two) times daily.     Marland Kitchen venlafaxine XR (EFFEXOR-XR) 75 MG 24 hr capsule Take 1 capsule (75 mg total) by mouth daily with breakfast. 90 capsule 1  . vitamin B-12 (CYANOCOBALAMIN) 1000 MCG tablet Take 1,000 mcg by mouth daily.    Marland Kitchen lidocaine-prilocaine (EMLA) cream Apply to affected area once (Patient not taking: Reported on 07/15/2017) 30 g 3   No current facility-administered  medications for this encounter.     Physical Findings:  height is 5' 4"  (1.626 m) and weight is 174 lb 3.2 oz (79 kg). Her temperature is 98.2 F (36.8 C). Her blood pressure is 124/73 and her pulse is 96. Her oxygen saturation is 99%. .     General: Alert and oriented, in no acute distress HEENT: Head is normocephalic. Extraocular movements are intact. Oropharynx is clear. Vascular: left chest portacath Heart: Regular in rate and rhythm with no murmurs, rubs, or gallops. Chest: Clear to auscultation bilaterally, with no rhonchi, wheezes, or rales. Extremities: No cyanosis or edema. Lymphatics: see Neck Exam Psychiatric: Judgment and insight are intact. Affect is appropriate. Breast exam reveals scars are healed well over the right breast, lumpectomy region and axilla. There is a small white nodule that is about 2 mm in size at the right axillary scar which appears to be consistent with pustular area which is not of clinical concern; it is superficial. Skin is without rashes. Upper extremities are without lymphedema.   Lab Findings: Lab Results  Component Value Date   WBC 0.8 (LL) 07/07/2017   HGB 11.1 (L) 07/07/2017   HCT 32.9 (L) 07/07/2017   MCV 95.6 07/07/2017   PLT 94 (L) 07/07/2017     Radiographic Findings: No results found.  Impression/Plan: We discussed adjuvant radiotherapy today.  I recommend 4 weeks of radiation therapy to the right breast and axilla with high tangents in order to reduce the risk of locoregional recurrence by 2/3s.  The risks, benefits and side effects of this treatment were discussed in detail.  She understands that radiotherapy is associated with skin irritation and fatigue in the acute setting. Late effects can include cosmetic changes and rare injury to internal organs.   She is enthusiastic about proceeding with treatment. A consent form has been signed and placed in her chart.  A total of 3 medically necessary complex treatment devices will be  fabricated and supervised by me: 2 fields with MLCs for custom blocks to protect heart, and lungs;  and, a Vac-lok. MORE COMPLEX DEVICES MAY BE MADE IN DOSIMETRY FOR FIELD IN FIELD BEAMS FOR DOSE HOMOGENEITY.  I have requested : 3D Simulation which is medically necessary to give adequate dose to at risk tissues while sparing lungs and heart.  I have requested a DVH of the following structures: lungs, heart, right lumpectomy cavity.    The patient will receive 42.56 Gy in 16 fractions to the right breast and axillary nodes with 2 high tangent fields.  This will be followed by a boost.    _____________________________________   Eppie Gibson, MD  This document serves as a record of services personally performed by Eppie Gibson, MD. It was created on his behalf by Linward Natal, a trained medical scribe. The creation of this record is based on the scribe's personal observations and the provider's statements to them. This document has been checked and approved by the attending provider.

## 2017-07-21 ENCOUNTER — Ambulatory Visit
Admission: RE | Admit: 2017-07-21 | Discharge: 2017-07-21 | Disposition: A | Discharge status: Home / Self Care | Payer: Medicare Other | Source: Ambulatory Visit | Attending: Radiation Oncology | Admitting: Radiation Oncology

## 2017-07-21 DIAGNOSIS — Z170 Estrogen receptor positive status [ER+]: Secondary | ICD-10-CM | POA: Diagnosis not present

## 2017-07-21 DIAGNOSIS — C50211 Malignant neoplasm of upper-inner quadrant of right female breast: Secondary | ICD-10-CM

## 2017-07-22 ENCOUNTER — Other Ambulatory Visit: Payer: Self-pay

## 2017-07-22 ENCOUNTER — Encounter (HOSPITAL_BASED_OUTPATIENT_CLINIC_OR_DEPARTMENT_OTHER): Payer: Self-pay | Admitting: *Deleted

## 2017-07-22 DIAGNOSIS — R059 Cough, unspecified: Secondary | ICD-10-CM

## 2017-07-22 DIAGNOSIS — J3489 Other specified disorders of nose and nasal sinuses: Secondary | ICD-10-CM

## 2017-07-22 HISTORY — DX: Other specified disorders of nose and nasal sinuses: J34.89

## 2017-07-22 HISTORY — DX: Cough, unspecified: R05.9

## 2017-07-22 NOTE — Progress Notes (Signed)
  Radiation Oncology         [phone]) [phone] ________________________________  Name: Mckenzie Webb MRN: [id number]  Date: 07/21/2017  DOB: [birth year]  SIMULATION AND TREATMENT PLANNING NOTE    Outpatient  DIAGNOSIS:     ICD-10-CM   1. Malignant neoplasm of upper-inner quadrant of right breast in female, estrogen receptor positive ([place]) C50.211    Z17.0     NARRATIVE:  The patient was brought to the [facility].  Identity was confirmed.  All relevant records and images related to the planned course of therapy were reviewed.  The patient freely provided informed written consent to proceed with treatment after reviewing the details related to the planned course of therapy. The consent form was witnessed and verified by the simulation staff.    Then, the patient was set-up in a stable reproducible supine position for radiation therapy with her ipsilateral arm over her head, and her upper body secured in a custom-made Vac-lok device.  CT images were obtained.  Surface markings were placed.  The CT images were loaded into the planning software.    TREATMENT PLANNING NOTE: Treatment planning then occurred.  The radiation prescription was entered and confirmed.     A total of 3 medically necessary complex treatment devices were fabricated and supervised by me: 2 fields with MLCs for custom blocks to protect heart, and lungs;  and, a Vac-lok. MORE COMPLEX DEVICES MAY BE MADE IN DOSIMETRY FOR FIELD IN FIELD BEAMS FOR DOSE HOMOGENEITY.  I have requested : 3D Simulation which is medically necessary to give adequate dose to at risk tissues while sparing lungs and heart.  I have requested a DVH of the following structures: lungs, heart, right lumpectomy cavity.    The patient will receive 42.56 Gy in 16 fractions to the right breast/axilla with 2 tangential fields.   This will be followed by a boost.  Optical Surface Tracking Plan:  Since intensity modulated radiotherapy (IMRT) and 3D  conformal radiation treatment methods are predicated on accurate and precise positioning for treatment, intrafraction motion monitoring is medically necessary to ensure accurate and safe treatment delivery. The ability to quantify intrafraction motion without excessive ionizing radiation dose can only be performed with optical surface tracking. Accordingly, surface imaging offers the opportunity to obtain 3D measurements of patient position throughout IMRT and 3D treatments without excessive radiation exposure. I am ordering optical surface tracking for this patient's upcoming course of radiotherapy.  ________________________________   Reference:  Ursula Alert, J, et al. Surface imaging-based analysis of intrafraction motion for breast radiotherapy patients.Journal of [place], n. 6, nov. 2014. ISSN [id number].  Available at: <http://www.jacmp.org/index.php/jacmp/article/view/4957>.    -----------------------------------  Eppie Gibson, MD

## 2017-07-22 NOTE — Pre-Procedure Instructions (Addendum)
Dr. Rodman Comp notified of WBC of 0.8 from 07/07/2017; need to notify Dr. Marlou Starks.  Abigail Butts at [facility] notified of same. Pt. to come pick up Ensure pre-surgery drink 10 oz. - to drink by 1000 DOS.

## 2017-07-23 DIAGNOSIS — Z170 Estrogen receptor positive status [ER+]: Secondary | ICD-10-CM | POA: Diagnosis not present

## 2017-07-23 DIAGNOSIS — C50211 Malignant neoplasm of upper-inner quadrant of right female breast: Secondary | ICD-10-CM | POA: Diagnosis not present

## 2017-07-24 ENCOUNTER — Other Ambulatory Visit: Payer: Self-pay

## 2017-07-24 NOTE — Pre-Procedure Instructions (Signed)
Per Abigail Butts at [facility] - Dr. Marlou Starks req. repeat CBC prior to surgery; pt. notified to come for CBC.

## 2017-07-25 ENCOUNTER — Encounter (HOSPITAL_BASED_OUTPATIENT_CLINIC_OR_DEPARTMENT_OTHER)
Admission: RE | Admit: 2017-07-25 | Discharge: 2017-07-25 | Disposition: A | Discharge status: Home / Self Care | Payer: Medicare Other | Source: Ambulatory Visit | Attending: General Surgery | Admitting: General Surgery

## 2017-07-25 DIAGNOSIS — Z01818 Encounter for other preprocedural examination: Secondary | ICD-10-CM | POA: Insufficient documentation

## 2017-07-25 DIAGNOSIS — Z95828 Presence of other vascular implants and grafts: Secondary | ICD-10-CM | POA: Diagnosis not present

## 2017-07-25 LAB — CBC
HEMATOCRIT: 36.3 % (ref 36.0–46.0)
Hemoglobin: 12.1 g/dL (ref 12.0–15.0)
MCH: 31.7 pg (ref 26.0–34.0)
MCHC: 33.3 g/dL (ref 30.0–36.0)
MCV: 95 fL (ref 78.0–100.0)
PLATELETS: 178 10*3/uL (ref 150–400)
RBC: 3.82 MIL/uL — ABNORMAL LOW (ref 3.87–5.11)
RDW: 14.7 % (ref 11.5–15.5)
WBC: 2.6 10*3/uL — ABNORMAL LOW (ref 4.0–10.5)

## 2017-07-25 NOTE — Progress Notes (Signed)
CBC results called to [place] office. WIll let Dr. Marlou Starks know.

## 2017-07-25 NOTE — Progress Notes (Signed)
Pt in for PAT appt, Ensure given, instructions reviewed.

## 2017-07-28 ENCOUNTER — Ambulatory Visit: Payer: Medicare Other | Admitting: Radiation Oncology

## 2017-07-29 ENCOUNTER — Other Ambulatory Visit: Payer: Medicare Other

## 2017-07-29 ENCOUNTER — Ambulatory Visit: Payer: Medicare Other | Admitting: Adult Health

## 2017-07-29 ENCOUNTER — Ambulatory Visit
Admission: RE | Admit: 2017-07-29 | Discharge: 2017-07-29 | Disposition: A | Discharge status: Home / Self Care | Payer: Medicare Other | Source: Ambulatory Visit | Attending: Radiation Oncology | Admitting: Radiation Oncology

## 2017-07-29 DIAGNOSIS — Z170 Estrogen receptor positive status [ER+]: Principal | ICD-10-CM

## 2017-07-29 DIAGNOSIS — C50211 Malignant neoplasm of upper-inner quadrant of right female breast: Secondary | ICD-10-CM

## 2017-07-29 MED ORDER — ALRA NON-METALLIC DEODORANT (RAD-ONC)
1.0000 "application " | Freq: Once | TOPICAL | Status: AC
  Administered 2017-07-29: 1 via TOPICAL

## 2017-07-29 MED ORDER — RADIAPLEXRX EX GEL
Freq: Two times a day (BID) | CUTANEOUS | Status: DC
  Administered 2017-07-29: 17:00:00 via TOPICAL

## 2017-07-29 NOTE — Progress Notes (Signed)
Pt here for patient teaching.  Pt given Radiation and You booklet, skin care instructions, Alra deodorant and Radiaplex gel.  Reviewed areas of pertinence such as fatigue, skin changes, breast tenderness and breast swelling . Pt able to give teach back of to pat skin and use unscented/gentle soap,apply Radiaplex bid, avoid applying anything to skin within 4 hours of treatment, avoid wearing an under wire bra and to use an electric razor if they must shave. Pt verbalizes understanding of information given and will contact nursing with any questions or concerns.     Http://rtanswers.org/treatmentinformation/whattoexpect/index

## 2017-07-30 ENCOUNTER — Ambulatory Visit
Admission: RE | Admit: 2017-07-30 | Discharge: 2017-07-30 | Disposition: A | Discharge status: Home / Self Care | Payer: Medicare Other | Source: Ambulatory Visit | Attending: Radiation Oncology | Admitting: Radiation Oncology

## 2017-07-30 DIAGNOSIS — C50211 Malignant neoplasm of upper-inner quadrant of right female breast: Secondary | ICD-10-CM | POA: Diagnosis not present

## 2017-07-30 DIAGNOSIS — Z170 Estrogen receptor positive status [ER+]: Secondary | ICD-10-CM | POA: Diagnosis not present

## 2017-07-31 ENCOUNTER — Ambulatory Visit
Admission: RE | Admit: 2017-07-31 | Discharge: 2017-07-31 | Disposition: A | Discharge status: Home / Self Care | Payer: Medicare Other | Source: Ambulatory Visit | Attending: Radiation Oncology | Admitting: Radiation Oncology

## 2017-07-31 DIAGNOSIS — C50211 Malignant neoplasm of upper-inner quadrant of right female breast: Secondary | ICD-10-CM | POA: Diagnosis not present

## 2017-07-31 DIAGNOSIS — Z170 Estrogen receptor positive status [ER+]: Secondary | ICD-10-CM | POA: Diagnosis not present

## 2017-08-01 ENCOUNTER — Other Ambulatory Visit (HOSPITAL_BASED_OUTPATIENT_CLINIC_OR_DEPARTMENT_OTHER): Payer: Medicare Other

## 2017-08-01 ENCOUNTER — Ambulatory Visit
Admission: RE | Admit: 2017-08-01 | Discharge: 2017-08-01 | Disposition: A | Discharge status: Home / Self Care | Payer: Medicare Other | Source: Ambulatory Visit | Attending: Radiation Oncology | Admitting: Radiation Oncology

## 2017-08-01 ENCOUNTER — Telehealth: Payer: Self-pay | Admitting: Adult Health

## 2017-08-01 ENCOUNTER — Ambulatory Visit (HOSPITAL_BASED_OUTPATIENT_CLINIC_OR_DEPARTMENT_OTHER): Payer: Medicare Other | Admitting: Adult Health

## 2017-08-01 ENCOUNTER — Encounter: Payer: Self-pay | Admitting: Adult Health

## 2017-08-01 VITALS — BP 168/86 | HR 86 | Temp 98.3°F | Resp 21 | Wt 174.6 lb

## 2017-08-01 DIAGNOSIS — C50211 Malignant neoplasm of upper-inner quadrant of right female breast: Secondary | ICD-10-CM | POA: Diagnosis not present

## 2017-08-01 DIAGNOSIS — Z170 Estrogen receptor positive status [ER+]: Secondary | ICD-10-CM

## 2017-08-01 DIAGNOSIS — Z23 Encounter for immunization: Secondary | ICD-10-CM

## 2017-08-01 DIAGNOSIS — Z79811 Long term (current) use of aromatase inhibitors: Secondary | ICD-10-CM

## 2017-08-01 DIAGNOSIS — T451X5A Adverse effect of antineoplastic and immunosuppressive drugs, initial encounter: Secondary | ICD-10-CM

## 2017-08-01 DIAGNOSIS — I427 Cardiomyopathy due to drug and external agent: Secondary | ICD-10-CM

## 2017-08-01 DIAGNOSIS — E2839 Other primary ovarian failure: Secondary | ICD-10-CM

## 2017-08-01 LAB — COMPREHENSIVE METABOLIC PANEL
ALT: 17 U/L (ref 0–55)
ANION GAP: 10 meq/L (ref 3–11)
AST: 18 U/L (ref 5–34)
Albumin: 3.6 g/dL (ref 3.5–5.0)
Alkaline Phosphatase: 67 U/L (ref 40–150)
BILIRUBIN TOTAL: 0.38 mg/dL (ref 0.20–1.20)
BUN: 11.1 mg/dL (ref 7.0–26.0)
CO2: 24 meq/L (ref 22–29)
CREATININE: 0.8 mg/dL (ref 0.6–1.1)
Calcium: 9.6 mg/dL (ref 8.4–10.4)
Chloride: 108 mEq/L (ref 98–109)
EGFR: >60 mL/min/{1.73_m2} (ref >60)
GLUCOSE: 101 mg/dL (ref 70–140)
Potassium: 4.4 mEq/L (ref 3.5–5.1)
SODIUM: 142 meq/L (ref 136–145)
TOTAL PROTEIN: 6.6 g/dL (ref 6.4–8.3)

## 2017-08-01 LAB — CBC WITH DIFFERENTIAL/PLATELET
BASO%: 0.9 % (ref 0.0–2.0)
BASOS ABS: 0 10*3/uL (ref 0.0–0.1)
EOS%: 2.6 % (ref 0.0–7.0)
Eosinophils Absolute: 0.1 10*3/uL (ref 0.0–0.5)
HCT: 38.5 % (ref 34.8–46.6)
HGB: 12.8 g/dL (ref 11.6–15.9)
LYMPH%: 14.1 % (ref 14.0–49.7)
MCH: 31.8 pg (ref 25.1–34.0)
MCHC: 33.4 g/dL (ref 31.5–36.0)
MCV: 95.3 fL (ref 79.5–101.0)
MONO#: 0.6 10*3/uL (ref 0.1–0.9)
MONO%: 14.4 % — AB (ref 0.0–14.0)
NEUT#: 2.8 10*3/uL (ref 1.5–6.5)
NEUT%: 68 % (ref 38.4–76.8)
Platelets: 203 10*3/uL (ref 145–400)
RBC: 4.04 10*6/uL (ref 3.70–5.45)
RDW: 15.2 % — ABNORMAL HIGH (ref 11.2–14.5)
WBC: 4.1 10*3/uL (ref 3.9–10.3)
lymph#: 0.6 10*3/uL — ABNORMAL LOW (ref 0.9–3.3)

## 2017-08-01 MED ORDER — INFLUENZA VAC SPLIT QUAD 0.5 ML IM SUSY
0.5000 mL | PREFILLED_SYRINGE | Freq: Once | INTRAMUSCULAR | Status: AC
  Administered 2017-08-01: 0.5 mL via INTRAMUSCULAR
  Filled 2017-08-01: qty 0.5

## 2017-08-01 NOTE — Telephone Encounter (Signed)
Patient declined AVs. Gave patient calendar of upcoming December appointments.

## 2017-08-01 NOTE — Progress Notes (Signed)
[facility]  Telephone:(336) [phone] Fax:(336) [phone]     ID: Mckenzie Webb DOB: [birth year]  MR#: [id number]  BJS#:[id number]  Patient Care Team: Rita Ohara, MD as PCP - General (Family Medicine) Jovita Kussmaul, MD as Consulting Physician (General Surgery) Magrinat, Virgie Dad, MD as Consulting Physician (Oncology) Eppie Gibson, MD as Attending Physician (Radiation Oncology) Juanita Craver, MD as Consulting Physician (Gastroenterology) Vania Rea, MD as Consulting Physician (Obstetrics and Gynecology) Elsie Saas, MD as Consulting Physician (Orthopedic Surgery) Martinique, Amy, MD as Consulting Physician (Dermatology) OTHER MD:  CHIEF COMPLAINT: Estrogen receptor positive breast cancer  CURRENT TREATMENT: Adjuvant radiation   BREAST CANCER HISTORY: From the original intake note:  "Mckenzie Webb" had bilateral screening mammography with tomography at the [facility] 02/21/2017. This showed a possible mass in the right breast. Right diagnostic mammography with tomography and ultrasonography 02/27/2017 found a breast density to be category B. In the subareolar right breast there was a 0.5 cm spiculated mass, which was not palpable. There was mild right nipple inversion, which per report was chronic. Ultrasonography confirmed a 0.5 cm retroareolar breast mass at the 1:00 radiant. The right axilla was sonographically benign.  On 03/03/2017 the patient underwent biopsy of the right breast mass in question. This showed (SAA [id number]) and invasive ductal carcinoma, grade 1, estrogen receptor 100% positive, progesterone receptor negative, with an MIB-1 of 3%, and no HER-2 implication, the signals ratio being 1.25 and the number per cell 2.00.  The patient's subsequent history is as detailed below.  INTERVAL HISTORY: Mckenzie Webb returns today for follow-up and treatment of her estrogen receptor positive breast cancer.  She is currently undergoing radiation.  She is doing well today.  She  is taking Anastrozole daily and is tolerating it well.    REVIEW OF SYSTEMS: Mckenzie Webb recently had low counts after treatment, however today she is doing well.  She wants to know if she can get a flu shot since her counts have recovered.  She denies fevers, chills, nausea, vomiting, constipation, diarrhea, or any other concerns.  A detailed ROS is non contributory.    PAST MEDICAL HISTORY: Past Medical History:  Diagnosis Date  . Arthritis    lower back  . Cough 07/22/2017  . Dental crowns present   . Family history of adverse reaction to anesthesia    pt's sister has hx. of post-op N/V  . GERD (gastroesophageal reflux disease)   . History of chemotherapy    finished chemo 06/30/2017  . History of right breast cancer 02/2017  . Hyperlipidemia   . Hypothyroid   . Leukopenia 07/07/2017  . Stuffy and runny nose 07/22/2017   clear drainage from nose, per pt.    PAST SURGICAL HISTORY: Past Surgical History:  Procedure Laterality Date  . BREAST LUMPECTOMY WITH RADIOACTIVE SEED AND SENTINEL LYMPH NODE BIOPSY Right 03/26/2017   Procedure: BREAST LUMPECTOMY WITH RADIOACTIVE SEED AND SENTINEL LYMPH NODE BIOPSY;  Surgeon: Jovita Kussmaul, MD;  Location: [facility];  Service: General;  Laterality: Right;  . CHOLECYSTECTOMY    . COLONOSCOPY  3/09  . CYST EXCISION  02/01/2003   knee  . KNEE ARTHROSCOPY Right 1990  . KNEE ARTHROSCOPY Left 12/12/2010  . PORTACATH PLACEMENT Left 04/24/2017   Procedure: INSERTION PORT-A-CATH;  Surgeon: Jovita Kussmaul, MD;  Location: [facility];  Service: General;  Laterality: Left;  . TUBAL LIGATION  1983  . VARICOSE VEIN SURGERY  09/2009 R, 06/2010 L    FAMILY HISTORY Family History  Problem Relation Age of Onset  . Hypertension Mother   . Heart disease Mother   . Gallbladder disease Mother   . Thyroid disease Mother   . Arthritis Mother   . Diabetes Father   . Cancer Father        pancreatic  . Hypertension Sister   . Gallbladder disease  Sister   . Diabetes Sister   . Diverticulitis Sister        of small intestine  . Cancer Son        testicular cancer  . Stroke Sister        late 19's  . Hypertension Sister   . Pulmonary embolism Sister   . Ulcerative colitis Sister   . COPD Brother        had lung lobe removed for suspected cancer, but was benign; smoker  . Colon cancer Other 41  . Lung cancer Cousin        maternal first cousin - smoker  The patient's father died from pancreatic cancer at the age of 62. The patient's mother died from a myocardial infarction at age 44. The patient had one brother, 3 sisters. The patient's older son was diagnosed with testicular cancer at age 21. A maternal niece was diagnosed with colon cancer at age 41 and a maternal cousin with lung cancer at an unknown age.  GYNECOLOGIC HISTORY:  No LMP recorded. Patient is postmenopausal.  Menarche age 71, first live birth age 71, the patient is [place] P2. she went through the change of life approximately the year 2000. She did not take hormone replacement. She did take oral contraceptives for more than 20 years remotely, with no complications.  SOCIAL HISTORY:  She is a retired Web designer. Her husband Mckenzie Webb is an Clinical biochemist, still working part-time. Son Mckenzie Webb (from the patient's first marriage) lives in [place] and works in Therapist, art. Son Mckenzie Webb (from patient second marriage) lives in [place] we are is Comptroller. The patient has one grandchild. She is not a Ambulance person.   ADVANCED DIRECTIVES: Not in place   HEALTH MAINTENANCE: Social History   Tobacco Use  . Smoking status: Never Smoker  . Smokeless tobacco: Never Used  Substance Use Topics  . Alcohol use: Yes    Comment: "very little"  . Drug use: No     Colonoscopy:2009/[facility] and  PAP:  [facility] T score -0.1 (normal)   Allergies  Allergen Reactions  . Adhesive [Tape] Other (See Comments)    SKIN TEARS  .  Ciprofloxacin Nausea Only  . Codeine Nausea Only  . Iodine Swelling  . Polysporin [Bacitracin-Polymyxin B] Swelling    OPHTHALMIC - SWELLING OF EYES  . Penicillins Rash       . Sulfa Antibiotics Itching    Current Outpatient Medications  Medication Sig Dispense Refill  . anastrozole (ARIMIDEX) 1 MG tablet Take 1 mg by mouth daily.    . Coenzyme Q10 (CO Q-10) 100 MG CAPS Take by mouth daily.    Marland Kitchen levothyroxine (SYNTHROID, LEVOTHROID) 88 MCG tablet Take 88 mcg by mouth daily before breakfast.    . loratadine (CLARITIN) 10 MG tablet Take 10 mg by mouth daily as needed for allergies.     . Multiple Vitamins-Minerals (ALIVE WOMENS 50+ PO) Take 1 tablet by mouth daily.     . naproxen sodium (ALEVE) 220 MG tablet Take 220 mg by mouth 2 (two) times daily.    . Omega-3 Fatty Acids (FISH OIL)  1200 MG CAPS Take 1,200 mg by mouth at bedtime.     Marland Kitchen omeprazole (PRILOSEC) 40 MG capsule TAKE ONE (1) CAPSULE EACH DAY 90 capsule 0  . Red Yeast Rice Extract (RED YEAST RICE PO) Take 1,200 mg by mouth 2 (two) times daily.     Marland Kitchen venlafaxine XR (EFFEXOR-XR) 75 MG 24 hr capsule Take 1 capsule (75 mg total) by mouth daily with breakfast. 90 capsule 1  . vitamin B-12 (CYANOCOBALAMIN) 1000 MCG tablet Take 1,000 mcg by mouth daily.     No current facility-administered medications for this visit.     OBJECTIVE:   Vitals:   08/01/17 1107  BP: (!) 168/86  Pulse: 86  Resp: (!) 21  Temp: 98.3 F (36.8 C)  SpO2: 99%     Body mass index is 29.97 kg/m.   Wt Readings from Last 3 Encounters:  08/01/17 174 lb 9.6 oz (79.2 kg)  07/15/17 174 lb 3.2 oz (79 kg)  07/07/17 174 lb 12.8 oz (79.3 kg)   ECOG FS:1 - Symptomatic but completely ambulatory  GENERAL: Patient is a well appearing female in no acute distress HEENT:  Sclerae anicteric.  Oropharynx clear and moist. No ulcerations or evidence of oropharyngeal candidiasis. Neck is supple.  NODES:  No cervical, supraclavicular, or axillary lymphadenopathy  palpated.  BREAST EXAM:  Deferred. LUNGS:  Clear to auscultation bilaterally.  No wheezes or rhonchi. HEART:  Regular rate and rhythm. No murmur appreciated. ABDOMEN:  Soft, nontender.  Positive, normoactive bowel sounds. No organomegaly palpated. MSK:  No focal spinal tenderness to palpation. Full range of motion bilaterally in the upper extremities. EXTREMITIES:  No peripheral edema.   SKIN:  Clear with no obvious rashes or skin changes. No nail dyscrasia. NEURO:  Nonfocal. Well oriented.  Appropriate affect.    LAB RESULTS:  CMP     Component Value Date/Time   NA 142 08/01/2017 1056   K 4.4 08/01/2017 1056   CL 100 (L) 05/02/2017 2301   CO2 24 08/01/2017 1056   GLUCOSE 101 08/01/2017 1056   BUN 11.1 08/01/2017 1056   CREATININE 0.8 08/01/2017 1056   CALCIUM 9.6 08/01/2017 1056   PROT 6.6 08/01/2017 1056   ALBUMIN 3.6 08/01/2017 1056   AST 18 08/01/2017 1056   ALT 17 08/01/2017 1056   ALKPHOS 67 08/01/2017 1056   BILITOT 0.38 08/01/2017 1056   GFRNONAA >60 05/02/2017 2301   GFRAA >60 05/02/2017 2301    No results found for: TOTALPROTELP, ALBUMINELP, A1GS, A2GS, BETS, BETA2SER, GAMS, MSPIKE, SPEI  No results found for: Nils Pyle, [facility]  Lab Results  Component Value Date   WBC 4.1 08/01/2017   NEUTROABS 2.8 08/01/2017   HGB 12.8 08/01/2017   HCT 38.5 08/01/2017   MCV 95.3 08/01/2017   PLT 203 08/01/2017      Chemistry      Component Value Date/Time   NA 142 08/01/2017 1056   K 4.4 08/01/2017 1056   CL 100 (L) 05/02/2017 2301   CO2 24 08/01/2017 1056   BUN 11.1 08/01/2017 1056   CREATININE 0.8 08/01/2017 1056      Component Value Date/Time   CALCIUM 9.6 08/01/2017 1056   ALKPHOS 67 08/01/2017 1056   AST 18 08/01/2017 1056   ALT 17 08/01/2017 1056   BILITOT 0.38 08/01/2017 1056       No results found for: LABCA2  No components found for: [id number]  No results for input(s): INR in the last 168 hours.  Urinalysis  Component Value Date/Time   COLORURINE YELLOW 05/02/2017 2201   APPEARANCEUR HAZY (A) 05/02/2017 2201   LABSPEC 1.014 05/02/2017 2201   PHURINE 6.0 05/02/2017 2201   GLUCOSEU NEGATIVE 05/02/2017 2201   HGBUR SMALL (A) 05/02/2017 2201   BILIRUBINUR NEGATIVE 05/02/2017 2201   BILIRUBINUR neg 08/17/2014 1123   KETONESUR NEGATIVE 05/02/2017 2201   PROTEINUR NEGATIVE 05/02/2017 2201   UROBILINOGEN negative 08/17/2014 1123   NITRITE POSITIVE (A) 05/02/2017 2201   LEUKOCYTESUR LARGE (A) 05/02/2017 2201     STUDIES: No results found.  ELIGIBLE FOR AVAILABLE RESEARCH PROTOCOL: no  ASSESSMENT: 71 y.o. [place] woman status post right breast upper inner quadrant biopsy 03/04/2015 for a clinical T1a N0, stage IA invasive ductal carcinoma, grade 1, estrogen receptor positive, progesterone receptor negative, with an MIB-1 of 3% and no HER-2 amplification  (1) anastrozole started neoadjuvantly 03/12/2017, held at the start of chemotherapy, resumed late November 2018  (2) genetics testing 0 03/2017 through the  common hereditary cancer panel offered by [facility] found no deleterious mutations in APC, ATM, AXIN2, BARD1, BMPR1A, BRCA1, BRCA2, BRIP1, CDH1, CDKN2A (p14ARF), CDKN2A (p16INK4a), CHEK2, CTNNA1, DICER1, EPCAM (Deletion/duplication testing only), GREM1 (promoter region deletion/duplication testing only), KIT, MEN1, MLH1, MSH2, MSH3, MSH6, MUTYH, NBN, NF1, NHTL1, PALB2, PDGFRA, PMS2, POLD1, POLE, PTEN, RAD50, RAD51C, RAD51D, SDHB, SDHC, SDHD, SMAD4, SMARCA4. STK11, TP53, TSC1, TSC2, and VHL.  The following genes were evaluated for sequence changes only: SDHA and HOXB13 c.251G>A variant only.    (3) right lumpectomy and sentinel lymph node biopsy 03/26/2017 showed a pT1c pN1, stage IB invasive ductal carcinoma, grade 1, with negative margins  (4) Mammaprint returned high risk, indicating need for adjuvant chemotherapy  (5) chemotherapy consisting of Cytoxan and docetaxel given every 3 weeks 4  beginning on 04/28/2017  (a) changed to Doxorubicin and Cyclophosphamide beginning 05/19/2017, completing 3 cycles 06/30/2017  (b) dose reduced by 20% with the second and third CA treatments  (6) Currently undergoing radiation with Dr. Isidore Moos  (7) Anastrozole started in 06/2017  PLAN: Mckenzie Webb is doing well today.  Her labs have recovered.  She will get a flu shot today.  She is doing well.  She is tolerating the Anastrozole well and will continue this.  She will return in February to f/u with Dr. [name], and she will f/u with me in May to review her [place].  I reviewed this with her in detail.    She knows to call for any other issues that may develop before her next appointment with [place].    A total of (30) minutes of face-to-face time was spent with this patient with greater than 50% of that time in counseling and care-coordination.   Wilber Bihari, NP  08/01/17 4:18 PM Medical Oncology and Hematology [facility] [address] [place], [place] [address] Tel. [phone]    Fax. [phone]

## 2017-08-03 ENCOUNTER — Other Ambulatory Visit: Payer: Self-pay | Admitting: Family Medicine

## 2017-08-04 ENCOUNTER — Ambulatory Visit
Admission: RE | Admit: 2017-08-04 | Discharge: 2017-08-04 | Disposition: A | Discharge status: Home / Self Care | Payer: Medicare Other | Source: Ambulatory Visit | Attending: Radiation Oncology | Admitting: Radiation Oncology

## 2017-08-04 DIAGNOSIS — Z170 Estrogen receptor positive status [ER+]: Secondary | ICD-10-CM | POA: Diagnosis not present

## 2017-08-04 DIAGNOSIS — C50211 Malignant neoplasm of upper-inner quadrant of right female breast: Secondary | ICD-10-CM | POA: Diagnosis not present

## 2017-08-05 ENCOUNTER — Ambulatory Visit
Admission: RE | Admit: 2017-08-05 | Discharge: 2017-08-05 | Disposition: A | Discharge status: Home / Self Care | Payer: Medicare Other | Source: Ambulatory Visit | Attending: Radiation Oncology | Admitting: Radiation Oncology

## 2017-08-05 DIAGNOSIS — C50211 Malignant neoplasm of upper-inner quadrant of right female breast: Secondary | ICD-10-CM | POA: Diagnosis not present

## 2017-08-05 DIAGNOSIS — Z170 Estrogen receptor positive status [ER+]: Secondary | ICD-10-CM | POA: Diagnosis not present

## 2017-08-06 ENCOUNTER — Ambulatory Visit
Admission: RE | Admit: 2017-08-06 | Discharge: 2017-08-06 | Disposition: A | Discharge status: Home / Self Care | Payer: Medicare Other | Source: Ambulatory Visit | Attending: Radiation Oncology | Admitting: Radiation Oncology

## 2017-08-06 DIAGNOSIS — C50211 Malignant neoplasm of upper-inner quadrant of right female breast: Secondary | ICD-10-CM | POA: Diagnosis not present

## 2017-08-06 DIAGNOSIS — Z170 Estrogen receptor positive status [ER+]: Secondary | ICD-10-CM | POA: Diagnosis not present

## 2017-08-07 ENCOUNTER — Ambulatory Visit
Admission: RE | Admit: 2017-08-07 | Discharge: 2017-08-07 | Disposition: A | Discharge status: Home / Self Care | Payer: Medicare Other | Source: Ambulatory Visit | Attending: Radiation Oncology | Admitting: Radiation Oncology

## 2017-08-07 DIAGNOSIS — Z170 Estrogen receptor positive status [ER+]: Secondary | ICD-10-CM | POA: Diagnosis not present

## 2017-08-07 DIAGNOSIS — C50211 Malignant neoplasm of upper-inner quadrant of right female breast: Secondary | ICD-10-CM | POA: Diagnosis not present

## 2017-08-08 ENCOUNTER — Ambulatory Visit: Payer: Medicare Other

## 2017-08-10 ENCOUNTER — Other Ambulatory Visit: Payer: Self-pay | Admitting: Family Medicine

## 2017-08-11 ENCOUNTER — Ambulatory Visit: Payer: Medicare Other

## 2017-08-13 ENCOUNTER — Ambulatory Visit
Admission: RE | Admit: 2017-08-13 | Discharge: 2017-08-13 | Disposition: A | Discharge status: Home / Self Care | Payer: Medicare Other | Source: Ambulatory Visit | Attending: Radiation Oncology | Admitting: Radiation Oncology

## 2017-08-13 DIAGNOSIS — C50211 Malignant neoplasm of upper-inner quadrant of right female breast: Secondary | ICD-10-CM | POA: Diagnosis not present

## 2017-08-13 DIAGNOSIS — Z170 Estrogen receptor positive status [ER+]: Secondary | ICD-10-CM | POA: Diagnosis not present

## 2017-08-14 ENCOUNTER — Ambulatory Visit
Admission: RE | Admit: 2017-08-14 | Discharge: 2017-08-14 | Disposition: A | Discharge status: Home / Self Care | Payer: Medicare Other | Source: Ambulatory Visit | Attending: Radiation Oncology | Admitting: Radiation Oncology

## 2017-08-14 DIAGNOSIS — C50211 Malignant neoplasm of upper-inner quadrant of right female breast: Secondary | ICD-10-CM | POA: Diagnosis not present

## 2017-08-14 DIAGNOSIS — Z170 Estrogen receptor positive status [ER+]: Secondary | ICD-10-CM | POA: Diagnosis not present

## 2017-08-15 ENCOUNTER — Ambulatory Visit
Admission: RE | Admit: 2017-08-15 | Discharge: 2017-08-15 | Disposition: A | Discharge status: Home / Self Care | Payer: Medicare Other | Source: Ambulatory Visit | Attending: Radiation Oncology | Admitting: Radiation Oncology

## 2017-08-15 DIAGNOSIS — C50211 Malignant neoplasm of upper-inner quadrant of right female breast: Secondary | ICD-10-CM | POA: Diagnosis not present

## 2017-08-15 DIAGNOSIS — Z170 Estrogen receptor positive status [ER+]: Secondary | ICD-10-CM | POA: Diagnosis not present

## 2017-08-18 ENCOUNTER — Ambulatory Visit
Admission: RE | Admit: 2017-08-18 | Discharge: 2017-08-18 | Disposition: A | Discharge status: Home / Self Care | Payer: Medicare Other | Source: Ambulatory Visit | Attending: Radiation Oncology | Admitting: Radiation Oncology

## 2017-08-18 ENCOUNTER — Ambulatory Visit: Payer: Medicare Other | Admitting: Radiation Oncology

## 2017-08-18 DIAGNOSIS — Z170 Estrogen receptor positive status [ER+]: Secondary | ICD-10-CM | POA: Diagnosis not present

## 2017-08-18 DIAGNOSIS — C50211 Malignant neoplasm of upper-inner quadrant of right female breast: Secondary | ICD-10-CM | POA: Diagnosis not present

## 2017-08-20 ENCOUNTER — Ambulatory Visit
Admission: RE | Admit: 2017-08-20 | Discharge: 2017-08-20 | Disposition: A | Discharge status: Home / Self Care | Payer: Medicare Other | Source: Ambulatory Visit | Attending: Radiation Oncology | Admitting: Radiation Oncology

## 2017-08-20 DIAGNOSIS — Z170 Estrogen receptor positive status [ER+]: Secondary | ICD-10-CM | POA: Diagnosis not present

## 2017-08-20 DIAGNOSIS — C50211 Malignant neoplasm of upper-inner quadrant of right female breast: Secondary | ICD-10-CM | POA: Diagnosis not present

## 2017-08-21 ENCOUNTER — Ambulatory Visit
Admission: RE | Admit: 2017-08-21 | Discharge: 2017-08-21 | Disposition: A | Discharge status: Home / Self Care | Payer: Medicare Other | Source: Ambulatory Visit | Attending: Radiation Oncology | Admitting: Radiation Oncology

## 2017-08-21 DIAGNOSIS — C50211 Malignant neoplasm of upper-inner quadrant of right female breast: Secondary | ICD-10-CM | POA: Diagnosis not present

## 2017-08-21 DIAGNOSIS — Z170 Estrogen receptor positive status [ER+]: Secondary | ICD-10-CM | POA: Diagnosis not present

## 2017-08-22 ENCOUNTER — Ambulatory Visit: Payer: Medicare Other

## 2017-08-22 ENCOUNTER — Ambulatory Visit
Admission: RE | Admit: 2017-08-22 | Discharge: 2017-08-22 | Disposition: A | Discharge status: Home / Self Care | Payer: Medicare Other | Source: Ambulatory Visit | Attending: Radiation Oncology | Admitting: Radiation Oncology

## 2017-08-22 DIAGNOSIS — Z170 Estrogen receptor positive status [ER+]: Secondary | ICD-10-CM | POA: Diagnosis not present

## 2017-08-22 DIAGNOSIS — C50211 Malignant neoplasm of upper-inner quadrant of right female breast: Secondary | ICD-10-CM | POA: Diagnosis not present

## 2017-08-25 ENCOUNTER — Ambulatory Visit: Payer: Medicare Other

## 2017-08-25 ENCOUNTER — Ambulatory Visit
Admission: RE | Admit: 2017-08-25 | Discharge: 2017-08-25 | Disposition: A | Discharge status: Home / Self Care | Payer: Medicare Other | Source: Ambulatory Visit | Attending: Radiation Oncology | Admitting: Radiation Oncology

## 2017-08-25 ENCOUNTER — Other Ambulatory Visit: Payer: Self-pay | Admitting: Radiation Oncology

## 2017-08-25 DIAGNOSIS — C50211 Malignant neoplasm of upper-inner quadrant of right female breast: Secondary | ICD-10-CM | POA: Diagnosis not present

## 2017-08-25 DIAGNOSIS — Z170 Estrogen receptor positive status [ER+]: Secondary | ICD-10-CM | POA: Diagnosis not present

## 2017-08-26 ENCOUNTER — Ambulatory Visit
Admission: RE | Admit: 2017-08-26 | Discharge: 2017-08-26 | Disposition: A | Discharge status: Home / Self Care | Payer: Medicare Other | Source: Ambulatory Visit | Attending: Radiation Oncology | Admitting: Radiation Oncology

## 2017-08-26 ENCOUNTER — Ambulatory Visit: Payer: Medicare Other

## 2017-08-26 DIAGNOSIS — C50211 Malignant neoplasm of upper-inner quadrant of right female breast: Secondary | ICD-10-CM | POA: Diagnosis not present

## 2017-08-26 DIAGNOSIS — Z170 Estrogen receptor positive status [ER+]: Secondary | ICD-10-CM | POA: Diagnosis not present

## 2017-08-27 ENCOUNTER — Ambulatory Visit
Admission: RE | Admit: 2017-08-27 | Discharge: 2017-08-27 | Disposition: A | Discharge status: Home / Self Care | Payer: Medicare Other | Source: Ambulatory Visit | Attending: Radiation Oncology | Admitting: Radiation Oncology

## 2017-08-27 ENCOUNTER — Ambulatory Visit: Payer: Medicare Other

## 2017-08-27 ENCOUNTER — Encounter (HOSPITAL_BASED_OUTPATIENT_CLINIC_OR_DEPARTMENT_OTHER): Payer: Self-pay | Admitting: *Deleted

## 2017-08-27 DIAGNOSIS — Z170 Estrogen receptor positive status [ER+]: Secondary | ICD-10-CM | POA: Diagnosis not present

## 2017-08-27 DIAGNOSIS — C50211 Malignant neoplasm of upper-inner quadrant of right female breast: Secondary | ICD-10-CM | POA: Diagnosis not present

## 2017-08-28 ENCOUNTER — Ambulatory Visit
Admission: RE | Admit: 2017-08-28 | Discharge: 2017-08-28 | Disposition: A | Discharge status: Home / Self Care | Payer: Medicare Other | Source: Ambulatory Visit | Attending: Radiation Oncology | Admitting: Radiation Oncology

## 2017-08-28 ENCOUNTER — Ambulatory Visit: Payer: Medicare Other

## 2017-08-28 DIAGNOSIS — Z170 Estrogen receptor positive status [ER+]: Secondary | ICD-10-CM | POA: Diagnosis not present

## 2017-08-28 DIAGNOSIS — C50211 Malignant neoplasm of upper-inner quadrant of right female breast: Secondary | ICD-10-CM | POA: Diagnosis not present

## 2017-08-28 MED ORDER — RADIAPLEXRX EX GEL
Freq: Once | CUTANEOUS | Status: AC
  Administered 2017-08-28: 15:00:00 via TOPICAL

## 2017-08-29 ENCOUNTER — Ambulatory Visit: Payer: Medicare Other

## 2017-08-31 NOTE — Progress Notes (Signed)
Chief Complaint  Patient presents with  . Medicare Wellness    fasting annual wellness, no pap sees Dr.Wein and is UTD. No concerns other than runny eyes but has talk ed to Dr. [name] about this.   . Refill    wants refill on clobetasol.    Mckenzie Webb is a 72 y.o. female who presents for annual wellness visit and follow-up on chronic medical conditions.  She has the following concerns:  Mckenzie Webb was diagnosed with breast cancer this past year. She was diagnosed in 02/2017 with invasive ductal carcinoma, grade 1, estrogen receptor 100% positive, progesterone receptor negative, with an MIB-1 of 3%, and no HER-2 implication. She underwent right lumpectomy and sentinel lymph node biopsy 03/26/2017,which showed a pT1c pN1, stage IB invasive ductal carcinoma, grade 1, with negative margins. Mammaprint returned high risk, indicating need for adjuvant chemotherapy; she started chemo in September (per onc's note: Cytoxan and docetaxel given every 3 weeks 4 beginning on 04/28/2017; changed to Doxorubicin and Cyclophosphamide beginning 05/19/2017, completing 3 cycles 06/30/2017. Dose reduced by 20% with the second and third CA treatments.  She recently completed radiation with Dr. Isidore Moos, and continues to take Anastrozole (started 06/2017). She is feeling much better. She has continued tenderness/redness in the right breast. She will have her portacath removed on Wednesday.  She has been on Effexor since October--had a reaction after chemo, had pain, was crying, and has been on it since.  "it is my feel-good pill".   She denies side effects.  (not started for hot flashes).  Started by Dr. [name].  Hypertension: She was taken off her losartan HCT when she got very sick with chemo, was dehydrated.  It wasn't replaced with another medication. She has been monitoring her BP's, and over the last  10 days has ranged 119-167/67-80. Mostly 140's/70 (a few normal, many mid 130's-150's). She reports we have  verified her BP monitor as accurate in the past. Denies dizziness, chest pain, palpitations, shortness of breath, edema, muscle cramps.  Denies headaches (rare).  Hypothyroidism: Denies any changes in energy (some fatigue since radiation), bowels. No changes in weight, energy, moods.  She lost her hair with her chemo, just barely starting to grow back. Skin is always dry. psoriasis has been flaring. Compliant with medications.  Due for recheck of TSH.  Psoriasis--Started flaring just in the last 7-10 days across her gluteal area, low back/hips.  She has been using an OTC cortisone cream, helps with itching.  Asking for refill on clobetasol for prn use, has worked well in the past (but has been out).  Hyperlipidemia: She continues to take red yeast rice (2 BID), and 2 fish oil capsules daily.   GERD--She has been on daily PPI, changed from Nexium to omeprazole for insurance reasons in the past. She had recurrent symptoms when tried stopping the medication in the past. She also tried taking the OTC Nexium, but it wasn't effective. She has been doing well on omeprazole, with no missed doses, and no symptoms. Denies any dysphagia. We previously discussed risks/safety of long-term medications vs untreated reflux.  Trochanteric bursitis: Last injection was 01/2017, had both hips injected.  She currently has some slight discomfort in her low back (worse with standing, sometimes radiating down both legs). She saw Dr. Sherwood Gambler once, but didn't go back due to cancer diagnosis. Not having much pain in her hips currently (no pain with activities), but does report tenderness to touch the area (bilaterally).    IFG:  Tries to  limit sweets, carbs.  Only an occasional sweet tea. Lab Results  Component Value Date   HGBA1C 5.4 07/31/2016    Immunization History  Administered Date(s) Administered  . Influenza Split 06/20/2011, 06/19/2012, 05/25/2014  . Influenza Whole 05/19/2000, 05/19/2001  . Influenza,  High Dose Seasonal PF 05/23/2016  . Influenza,inj,Quad PF,6+ Mos 08/01/2017  . Influenza-Unspecified 05/24/2015  . Pneumococcal Conjugate-13 07/26/2013  . Pneumococcal Polysaccharide-23 06/16/2000, 07/27/2014  . Td 04/13/2001  . Tdap 12/31/2010  . Zoster 07/26/2013   Last Pap smear: UTD through GYN  Last mammogram: 02/2017 Last colonoscopy: 3/09  Last DEXA: 12/2014, normal; plans to get another in July with her next mammogram Dentist: every 6 months  Ophtho: yearly Exercise: limited (due to her cancer treatments); plans to resume walking (30 minutes/day).  Previously walked 1.5 miles (about 30 minutes), 5-7 days/week. No weight-bearing exercise other than picking up her 40# grandchild (not often) Vitamin D level was normal in 2012  Other doctors caring for patient include: Ophtho: Dr. Jorja Loa GYN: Dr. Stann Mainland GI: Dr. Collene Mares Dentist: Dr. Ahmed Prima Previously saw urologist at [facility] (for IC), not seen in years Ortho: Dr. Noemi Chapel Surgeon: Dr. Marlou Starks Oncologist: Dr. [name] Radiation oncology: Dr. Isidore Moos Derm: Dr. Martinique   Depression, Fall and Functional Status screens were performed and unremarkable. Please see questionnaires in epic.  End of Life Discussion: Patient does not havea living will and medical power of attorney. She was given forms last year but admits she didn't do them yet. Still has the papers.  Past Medical History:  Diagnosis Date  . Arthritis    lower back  . Cough 07/22/2017  . Dental crowns present   . Family history of adverse reaction to anesthesia    pt's sister has hx. of post-op N/V  . GERD (gastroesophageal reflux disease)   . History of chemotherapy    finished chemo 06/30/2017  . History of right breast cancer 02/2017  . Hyperlipidemia   . Hypothyroid   . Leukopenia 07/07/2017  . Stuffy and runny nose 07/22/2017   clear drainage from nose, per pt.    Past Surgical History:  Procedure Laterality Date  . BREAST LUMPECTOMY WITH  RADIOACTIVE SEED AND SENTINEL LYMPH NODE BIOPSY Right 03/26/2017   Procedure: BREAST LUMPECTOMY WITH RADIOACTIVE SEED AND SENTINEL LYMPH NODE BIOPSY;  Surgeon: Jovita Kussmaul, MD;  Location: [facility];  Service: General;  Laterality: Right;  . CHOLECYSTECTOMY    . COLONOSCOPY  3/09  . CYST EXCISION  02/01/2003   knee  . KNEE ARTHROSCOPY Right 1990  . KNEE ARTHROSCOPY Left 12/12/2010  . PORTACATH PLACEMENT Left 04/24/2017   Procedure: INSERTION PORT-A-CATH;  Surgeon: Jovita Kussmaul, MD;  Location: [facility];  Service: General;  Laterality: Left;  . TUBAL LIGATION  1983  . VARICOSE VEIN SURGERY  09/2009 R, 06/2010 L    Social History   Socioeconomic History  . Marital status: Married    Spouse name: Not on file  . Number of children: 2  . Years of education: Not on file  . Highest education level: Not on file  Social Needs  . Financial resource strain: Not on file  . Food insecurity - worry: Not on file  . Food insecurity - inability: Not on file  . Transportation needs - medical: Not on file  . Transportation needs - non-medical: Not on file  Occupational History  . Occupation: office work for [employer]    Employer: [employer]  Tobacco Use  . Smoking  status: Never Smoker  . Smokeless tobacco: Never Used  Substance and Sexual Activity  . Alcohol use: Yes    Comment: "very little"  . Drug use: No  . Sexual activity: Yes    Partners: Male  Other Topics Concern  . Not on file  Social History Narrative   Lives with husband, no pets.  Children in [place], [place] and [place].  1 granddaughter (in [place]). Retired 05/2014.    Family History  Problem Relation Age of Onset  . Hypertension Mother   . Heart disease Mother   . Gallbladder disease Mother   . Thyroid disease Mother   . Arthritis Mother   . Diabetes Father   . Cancer Father        pancreatic  . Hypertension Sister   . Gallbladder disease Sister   . Diabetes Sister   . Diverticulitis  Sister        of small intestine  . Cancer Son        testicular cancer  . Stroke Sister        late 46's  . Hypertension Sister   . Pulmonary embolism Sister   . Ulcerative colitis Sister   . COPD Brother        had lung lobe removed for suspected cancer, but was benign; smoker  . Colon cancer Other 57  . Lung cancer Cousin        maternal first cousin - smoker    Outpatient Encounter Medications as of 09/01/2017  Medication Sig Note  . anastrozole (ARIMIDEX) 1 MG tablet Take 1 mg by mouth daily.   . Coenzyme Q10 (CO Q-10) 100 MG CAPS Take by mouth daily.   Marland Kitchen levothyroxine (SYNTHROID, LEVOTHROID) 88 MCG tablet TAKE 1 TABLET BY MOUTH DAILY   . loratadine (CLARITIN) 10 MG tablet Take 10 mg by mouth daily as needed for allergies.    . Multiple Vitamins-Minerals (ALIVE WOMENS 50+ PO) Take 1 tablet by mouth daily.    . Omega-3 Fatty Acids (FISH OIL) 1200 MG CAPS Take 1,200 mg by mouth at bedtime.    Marland Kitchen omeprazole (PRILOSEC) 40 MG capsule TAKE ONE (1) CAPSULE EACH DAY   . Red Yeast Rice Extract (RED YEAST RICE PO) Take 1,200 mg by mouth 2 (two) times daily.    Marland Kitchen venlafaxine XR (EFFEXOR-XR) 75 MG 24 hr capsule Take 1 capsule (75 mg total) by mouth daily with breakfast.   . vitamin B-12 (CYANOCOBALAMIN) 1000 MCG tablet Take 1,000 mcg by mouth daily.   Marland Kitchen losartan-hydrochlorothiazide (HYZAAR) 50-12.5 MG tablet TAKE 1 TABLET BY MOUTH DAILY (Patient not taking: Reported on 09/01/2017) 09/01/2017: Stopped while taking chemo (and sick/dehydrated)  . naproxen sodium (ALEVE) 220 MG tablet Take 220 mg by mouth 2 (two) times daily.    No facility-administered encounter medications on file as of 09/01/2017.     Allergies  Allergen Reactions  . Adhesive [Tape] Other (See Comments)    SKIN TEARS  . Ciprofloxacin Nausea Only  . Codeine Nausea Only  . Iodine Swelling  . Polysporin [Bacitracin-Polymyxin B] Swelling    OPHTHALMIC - SWELLING OF EYES  . Penicillins Rash       . Sulfa Antibiotics  Itching    ROS: The patient denies anorexia, fever, vision changes, decreased hearing, ear pain, sore throat, breast concerns, chest pain, palpitations, dizziness, syncope, dyspnea on exertion, swelling, nausea, vomiting, melena, hematochezia, hematuria, incontinence, dysuria, vaginal bleeding, discharge, odor or itch, genital lesions, numbness, tingling, weakness, tremor, suspicious skin lesions, depression,  anxiety, abnormal bleeding/bruising, or enlarged lymph nodes. No memory concerns. Has intermittent insomnia, relieved by Tylenol PM as needed (infrequent). hemorroids periodically flare, not currently. Low back pain, worse with prolonged standing, radiates down the both legs intermittently. Denies numbness, tingling, weakness.  No further problems with diarrhea, and no longer takes probiotics. Morning nasal/sinus congestion.  Prn claritin helps. Intermittent slight cough, not frequent. Dry cough, described as deep. Not frequent and not bothersome. Has been better since she started her chemo, which is also when she stopped the losartan HCT   PHYSICAL EXAM:  BP 130/76   Pulse 64   Ht 5' 4.5" (1.638 m)   Wt 172 lb 6.4 oz (78.2 kg)   BMI 29.14 kg/m   138/80 on repeat by MD, [place]  General Appearance:  Alert, cooperative, no distress, appears stated age. She is wearing baseball cap; alopecia related to chemo, starting to show some regrowth.  Head:  Normocephalic, without obvious abnormality, atraumatic   Eyes:  PERRL, conjunctiva/corneas clear, EOM's intact, fundi benign   Ears:  Normal TM's and external ear canals   Nose:  Nares normal, mucosa shows recent area of bleeding right distal nares, otherwise normal; no sinus tenderness   Throat:  Lips, mucosa, and tongue normal; teeth and gums normal   Neck:  Supple, no lymphadenopathy; thyroid: no enlargement/tenderness/nodules; no carotid bruit or JVD   Back:  Spine nontender, no curvature, ROM normal, no CVA  tenderness.   Lungs:  Clear to auscultation bilaterally without wheezes, rales or ronchi; respirations unlabored   Chest Wall:  No tenderness or deformity. Portacath is present in left upper chest. There is diffuse mild erythema across right upper chest and extending into the right breast. (related to radiation, recently completed)  Heart:  Regular rate and rhythm, S1 and S2 normal, no murmur, rub or gallop   Breast Exam:  Deferred to GYN   Abdomen:  Soft, nondistended, nontender, normoactive bowel sounds, no masses, no hepatosplenomegaly. No rebound or guarding.  Genitalia:  Deferred to GYN      Extremities:  No clubbing, cyanosis or edema.Mildly tender over bilateral trochanteric bursae  Pulses:  2+ and symmetric all extremities   Skin:  Skin color, texture, turgor normal.  She has scattered small plaques across her upper buttocks/hips consistent with mild psoriasis.  Alopecia of scalp, slight regrowth starting.  Lymph nodes:  Cervical, supraclavicular, and axillary nodes normal   Neurologic:  CNII-XII intact, normal strength, sensation and gait; reflexes 2+ and symmetric throughout    Psych:   Normal mood, affect, hygiene and grooming   Lab Results  Component Value Date   WBC 4.1 08/01/2017   HGB 12.8 08/01/2017   HCT 38.5 08/01/2017   MCV 95.3 08/01/2017   PLT 203 08/01/2017     Chemistry      Component Value Date/Time   NA 142 08/01/2017 1056   K 4.4 08/01/2017 1056   CL 100 (L) 05/02/2017 2301   CO2 24 08/01/2017 1056   BUN 11.1 08/01/2017 1056   CREATININE 0.8 08/01/2017 1056      Component Value Date/Time   CALCIUM 9.6 08/01/2017 1056   ALKPHOS 67 08/01/2017 1056   AST 18 08/01/2017 1056   ALT 17 08/01/2017 1056   BILITOT 0.38 08/01/2017 1056     Glucose (nonfasting) 101    ASSESSMENT/PLAN:  Medicare annual wellness visit, subsequent  Essential hypertension, benign - BP's running above goal  since stopping losartan HCT; cough improved, poss from ARB; restart just HCTZ  12.5. Cont regular monitoring - Plan: hydrochlorothiazide (MICROZIDE) 12.5 MG capsule  Gastroesophageal reflux disease, esophagitis presence not specified - requires daily PPI; risks reviewed, cont supplements.   Pure hypercholesterolemia - on OTC's, due for recheck - Plan: Lipid panel  IFG (impaired fasting glucose) - Plan: Hemoglobin A1c, Glucose, random  Hypothyroidism, unspecified type - euthyroid per history; due for recheck - Plan: TSH  Malignant neoplasm of upper-inner quadrant of right breast in female, estrogen receptor positive ([place])  Psoriasis - Plan: clobetasol cream (TEMOVATE) 0.05 %   TSH, lipid, fasting glucose, A1c  Had normal CBC and c-met recently (nonfasting).  Since your blood pressures are slightly above goal, and your cough got better after stopping the losartan, let's restart the diuretic component only. Take 12.65m of HCTZ every morning. Continue to eat bananas in your diet. Keep your blood pressure log, and bring it with you to your next visit in 6-8 weeks.   Discussed monthly self breast exams and yearly mammograms; at least 30 minutes of aerobic activity at least 5 days/week and weight-bearing exercise 2x/week; proper sunscreen use reviewed; healthy diet, including goals of calcium and vitamin D intake and alcohol recommendations (less than or equal to 1 drink/day) reviewed; regular seatbelt use; changing batteries in smoke detectors. Immunization recommendations discussed--UTD; continue high dose flu shots yearly. Discussed Shingrix. Colonoscopy recommendations reviewed, UTD  MOST form reviewed and updated. Full Code, Full care. Encouraged to fill out and return Living Will and healthcare POA.   Chronic PPI use. Risks/benefits of treatment reviewed in detail. Continue current vitamins.  F/u 6-8 weeks, b-met at visit (unless done by onc)    Medicare Attestation I have  personally reviewed: The patient's medical and social history Their use of alcohol, tobacco or illicit drugs Their current medications and supplements The patient's functional ability including ADLs,fall risks, home safety risks, cognitive, and hearing and visual impairment Diet and physical activities Evidence for depression or mood disorders  The patient's weight, height and BMI have been recorded in the chart.  I have made referrals, counseling, and provided education to the patient based on review of the above and I have provided the patient with a written personalized care plan for preventive services.

## 2017-09-01 ENCOUNTER — Ambulatory Visit (INDEPENDENT_AMBULATORY_CARE_PROVIDER_SITE_OTHER): Payer: Medicare Other | Admitting: Family Medicine

## 2017-09-01 ENCOUNTER — Ambulatory Visit: Payer: Medicare Other | Admitting: Family Medicine

## 2017-09-01 ENCOUNTER — Encounter: Payer: Self-pay | Admitting: Family Medicine

## 2017-09-01 VITALS — BP 130/76 | HR 64 | Ht 64.5 in | Wt 172.4 lb

## 2017-09-01 DIAGNOSIS — R7301 Impaired fasting glucose: Secondary | ICD-10-CM | POA: Diagnosis not present

## 2017-09-01 DIAGNOSIS — K219 Gastro-esophageal reflux disease without esophagitis: Secondary | ICD-10-CM

## 2017-09-01 DIAGNOSIS — Z170 Estrogen receptor positive status [ER+]: Secondary | ICD-10-CM | POA: Diagnosis not present

## 2017-09-01 DIAGNOSIS — Z0000 Encounter for general adult medical examination without abnormal findings: Secondary | ICD-10-CM | POA: Diagnosis not present

## 2017-09-01 DIAGNOSIS — E7800 Pure hypercholesterolemia, unspecified: Secondary | ICD-10-CM

## 2017-09-01 DIAGNOSIS — C50211 Malignant neoplasm of upper-inner quadrant of right female breast: Secondary | ICD-10-CM | POA: Diagnosis not present

## 2017-09-01 DIAGNOSIS — E039 Hypothyroidism, unspecified: Secondary | ICD-10-CM | POA: Diagnosis not present

## 2017-09-01 DIAGNOSIS — L409 Psoriasis, unspecified: Secondary | ICD-10-CM

## 2017-09-01 DIAGNOSIS — I10 Essential (primary) hypertension: Secondary | ICD-10-CM

## 2017-09-01 MED ORDER — CLOBETASOL PROPIONATE 0.05 % EX CREA: TOPICAL_CREAM | CUTANEOUS | 0 refills | Status: DC

## 2017-09-01 MED ORDER — HYDROCHLOROTHIAZIDE 12.5 MG PO CAPS: 12.5000 mg | ORAL_CAPSULE | Freq: Every day | ORAL | 1 refills | Status: DC

## 2017-09-01 NOTE — Patient Instructions (Addendum)
HEALTH MAINTENANCE RECOMMENDATIONS:  It is recommended that you get at least 30 minutes of aerobic exercise at least 5 days/week (for weight loss, you may need as much as 60-90 minutes). This can be any activity that gets your heart rate up. This can be divided in 10-15 minute intervals if needed, but try and build up your endurance at least once a week.  Weight bearing exercise is also recommended twice weekly.  Eat a healthy diet with lots of vegetables, fruits and fiber.  "Colorful" foods have a lot of vitamins (ie green vegetables, tomatoes, red peppers, etc).  Limit sweet tea, regular sodas and alcoholic beverages, all of which has a lot of calories and sugar.  Up to 1 alcoholic drink daily may be beneficial for women (unless trying to lose weight, watch sugars).  Drink a lot of water.  Calcium recommendations are 1200-1500 mg daily (1500 mg for postmenopausal women or women without ovaries), and vitamin D 1000 IU daily.  This should be obtained from diet and/or supplements (vitamins), and calcium should not be taken all at once, but in divided doses.  Monthly self breast exams and yearly mammograms for women over the age of 55 is recommended.  Sunscreen of at least SPF 30 should be used on all sun-exposed parts of the skin when outside between the hours of 10 am and 4 pm (not just when at beach or pool, but even with exercise, golf, tennis, and yard work!)  Use a sunscreen that says "broad spectrum" so it covers both UVA and UVB rays, and make sure to reapply every 1-2 hours.  Remember to change the batteries in your smoke detectors when changing your clock times in the spring and fall. I recommend carbon monoxide detectors for the home.  Use your seat belt every time you are in a car, and please drive safely and not be distracted with cell phones and texting while driving.   Mckenzie Webb , Thank you for taking time to come for your Medicare Wellness Visit. I appreciate your ongoing  commitment to your health goals. Please review the following plan we discussed and let me know if I can assist you in the future.   These are the goals we discussed: Goals    None      This is a list of the screening recommended for you and due dates:  Health Maintenance  Topic Date Due  . Complete foot exam   08/16/1958  . Eye exam for diabetics  08/16/1958  . Hemoglobin A1C  01/29/2017  . Colon Cancer Screening  10/25/2017  . Mammogram  02/22/2019  . Tetanus Vaccine  12/30/2020  . Flu Shot  Completed  . DEXA scan (bone density measurement)  Completed  .  Hepatitis C: One time screening is recommended by Center for Disease Control  (CDC) for  adults born from [birth year] through 1963.   Completed  . Pneumonia vaccines  Completed   You are not a diabetic. (ignore the recommendations for foot exam, eye exam and A1c).   You had pre-diabetes, and doesn't require the same monitoring.  Your next mammogram is due 02/2018 (not 2020 as stated above).  I recommend getting the new shingles vaccine (Shingrix). You will need to check with your insurance to see if it is covered, and if covered by Medicare Part D, you need to get from the pharmacy rather than our office.  It is a series of 2 injections, spaced 2 months apart.  Please look at  your healthcare power of attorney and living will paperwork. We request a copy once you have it completed and notarized.  Since your blood pressures are slightly above goal, and your cough got better after stopping the losartan, let's restart the diuretic component only. Take 12.30m of HCTZ every morning. Continue to eat bananas in your diet. Keep your blood pressure log, and bring it with you to your next visit in 6-8 weeks.

## 2017-09-02 ENCOUNTER — Encounter: Payer: Self-pay | Admitting: Radiation Oncology

## 2017-09-02 LAB — LIPID PANEL
CHOL/HDL RATIO: 3.8 ratio (ref 0.0–4.4)
Cholesterol, Total: 250 mg/dL — ABNORMAL HIGH (ref 100–199)
HDL: 65 mg/dL (ref >39)
LDL CALC: 154 mg/dL — AB (ref 0–99)
TRIGLYCERIDES: 154 mg/dL — AB (ref 0–149)
VLDL CHOLESTEROL CAL: 31 mg/dL (ref 5–40)

## 2017-09-02 LAB — HEMOGLOBIN A1C
Est. average glucose Bld gHb Est-mCnc: 100 mg/dL
HEMOGLOBIN A1C: 5.1 % (ref 4.8–5.6)

## 2017-09-02 LAB — TSH: TSH: 0.045 u[IU]/mL — ABNORMAL LOW (ref 0.450–4.500)

## 2017-09-02 LAB — GLUCOSE, RANDOM: Glucose: 89 mg/dL (ref 65–99)

## 2017-09-02 MED ORDER — LEVOTHYROXINE SODIUM 75 MCG PO TABS: 75.0000 ug | ORAL_TABLET | Freq: Every day | ORAL | 0 refills | Status: DC

## 2017-09-02 NOTE — Progress Notes (Signed)
  Radiation Oncology         [phone] ________________________________  Name: Mckenzie Webb MRN: [id number]  Date: 09/02/2017  DOB: 07/11/1946  End of Treatment Note  Diagnosis:  Right breast invasive ductal carcinoma, Grade I, spanning 1.2 cm and DCIS, Grade I, ER 100%, PR negative, Her2 negative   Staging form: Breast, AJCC 8th Edition - Clinical stage from 03/12/2017: Stage IA (cT1a, cN0, cM0, G1, ER: Positive, PR: Positive, HER2: Negative) - Unsigned Staging comments: Staged at breast conference on 7.25.18 - Pathologic: Stage IA (pT1c, pN26m, cM0, G1, ER: Positive, PR: Negative, HER2: Negative) - Signed by SEppie Gibson MD on 07/15/2017     Indication for treatment:  Curative    Radiation treatment dates:   07/29/17 - 08/28/17    Site/dose:   1) Right breast and axilla treated to 42.56 Gy with 16 fx of 2.66  2)  boost of 8 Gy with 4 fx of 2 Gy  Beams/energy:   1) High Tangents 3D / 10X 2) en face / 15 MeV  Narrative: The patient tolerated radiation treatment relatively well.     Plan: The patient has completed radiation treatment. The patient will return to radiation oncology clinic for routine followup in one month. I advised them to call or return sooner if they have any questions or concerns related to their recovery or treatment.  -----------------------------------  SEppie Gibson MD  This document serves as a record of services personally performed by SEppie Gibson MD. It was created on his behalf by JLinward Natal a trained medical scribe. The creation of this record is based on the scribe's personal observations and the provider's statements to them. This document has been checked and approved by the attending provider.

## 2017-09-02 NOTE — Addendum Note (Signed)
Addended by: Rita Ohara on: 09/02/2017 07:54 AM   Modules accepted: Orders

## 2017-09-03 ENCOUNTER — Encounter (HOSPITAL_BASED_OUTPATIENT_CLINIC_OR_DEPARTMENT_OTHER): Payer: Self-pay | Admitting: Certified Registered"

## 2017-09-03 ENCOUNTER — Other Ambulatory Visit: Payer: Self-pay

## 2017-09-03 ENCOUNTER — Encounter (HOSPITAL_BASED_OUTPATIENT_CLINIC_OR_DEPARTMENT_OTHER)
Admission: RE | Disposition: A | Discharge status: Home / Self Care | Payer: Self-pay | Source: Ambulatory Visit | Attending: General Surgery

## 2017-09-03 ENCOUNTER — Ambulatory Visit (HOSPITAL_BASED_OUTPATIENT_CLINIC_OR_DEPARTMENT_OTHER)
Admission: RE | Admit: 2017-09-03 | Discharge: 2017-09-03 | Disposition: A | Discharge status: Home / Self Care | Payer: Medicare Other | Source: Ambulatory Visit | Attending: General Surgery | Admitting: General Surgery

## 2017-09-03 DIAGNOSIS — E039 Hypothyroidism, unspecified: Secondary | ICD-10-CM | POA: Diagnosis not present

## 2017-09-03 DIAGNOSIS — Z880 Allergy status to penicillin: Secondary | ICD-10-CM | POA: Insufficient documentation

## 2017-09-03 DIAGNOSIS — Z885 Allergy status to narcotic agent status: Secondary | ICD-10-CM | POA: Diagnosis not present

## 2017-09-03 DIAGNOSIS — Z882 Allergy status to sulfonamides status: Secondary | ICD-10-CM | POA: Diagnosis not present

## 2017-09-03 DIAGNOSIS — I10 Essential (primary) hypertension: Secondary | ICD-10-CM | POA: Diagnosis not present

## 2017-09-03 DIAGNOSIS — K219 Gastro-esophageal reflux disease without esophagitis: Secondary | ICD-10-CM | POA: Insufficient documentation

## 2017-09-03 DIAGNOSIS — Z79899 Other long term (current) drug therapy: Secondary | ICD-10-CM | POA: Insufficient documentation

## 2017-09-03 DIAGNOSIS — Z888 Allergy status to other drugs, medicaments and biological substances status: Secondary | ICD-10-CM | POA: Diagnosis not present

## 2017-09-03 DIAGNOSIS — M1990 Unspecified osteoarthritis, unspecified site: Secondary | ICD-10-CM | POA: Insufficient documentation

## 2017-09-03 DIAGNOSIS — Z853 Personal history of malignant neoplasm of breast: Secondary | ICD-10-CM | POA: Diagnosis not present

## 2017-09-03 DIAGNOSIS — Z452 Encounter for adjustment and management of vascular access device: Secondary | ICD-10-CM | POA: Diagnosis not present

## 2017-09-03 DIAGNOSIS — Z881 Allergy status to other antibiotic agents status: Secondary | ICD-10-CM | POA: Diagnosis not present

## 2017-09-03 DIAGNOSIS — F419 Anxiety disorder, unspecified: Secondary | ICD-10-CM | POA: Diagnosis not present

## 2017-09-03 DIAGNOSIS — K5190 Ulcerative colitis, unspecified, without complications: Secondary | ICD-10-CM | POA: Insufficient documentation

## 2017-09-03 HISTORY — DX: Unspecified osteoarthritis, unspecified site: M19.90

## 2017-09-03 HISTORY — DX: Cough: R05

## 2017-09-03 HISTORY — DX: Family history of other specified conditions: Z84.89

## 2017-09-03 HISTORY — DX: Dental restoration status: Z98.811

## 2017-09-03 HISTORY — DX: Personal history of malignant neoplasm of breast: Z85.3

## 2017-09-03 HISTORY — PX: PORT-A-CATH REMOVAL: SHX5289

## 2017-09-03 HISTORY — DX: Personal history of antineoplastic chemotherapy: Z92.21

## 2017-09-03 HISTORY — DX: Other specified disorders of nose and nasal sinuses: J34.89

## 2017-09-03 SURGERY — REMOVAL PORT-A-CATH
Anesthesia: General | Site: Chest

## 2017-09-03 MED ORDER — FENTANYL CITRATE (PF) 100 MCG/2ML IJ SOLN
INTRAMUSCULAR | Status: AC
  Filled 2017-09-03: qty 2

## 2017-09-03 MED ORDER — EPHEDRINE SULFATE 50 MG/ML IJ SOLN
INTRAMUSCULAR | Status: DC | PRN
  Administered 2017-09-03: 10 mg via INTRAVENOUS

## 2017-09-03 MED ORDER — LACTATED RINGERS IV SOLN
INTRAVENOUS | Status: DC
  Administered 2017-09-03: 10:00:00 via INTRAVENOUS

## 2017-09-03 MED ORDER — FENTANYL CITRATE (PF) 100 MCG/2ML IJ SOLN
50.0000 ug | INTRAMUSCULAR | Status: DC | PRN
  Administered 2017-09-03: 50 ug via INTRAVENOUS

## 2017-09-03 MED ORDER — MEPERIDINE HCL 25 MG/ML IJ SOLN: 6.2500 mg | INTRAMUSCULAR | Status: DC | PRN

## 2017-09-03 MED ORDER — ONDANSETRON HCL 4 MG/2ML IJ SOLN
INTRAMUSCULAR | Status: DC | PRN
  Administered 2017-09-03: 4 mg via INTRAVENOUS

## 2017-09-03 MED ORDER — CHLORHEXIDINE GLUCONATE CLOTH 2 % EX PADS: 6.0000 | MEDICATED_PAD | Freq: Once | CUTANEOUS | Status: DC

## 2017-09-03 MED ORDER — DEXAMETHASONE SODIUM PHOSPHATE 4 MG/ML IJ SOLN
INTRAMUSCULAR | Status: DC | PRN
  Administered 2017-09-03: 10 mg via INTRAVENOUS

## 2017-09-03 MED ORDER — ONDANSETRON HCL 4 MG/2ML IJ SOLN: 4.0000 mg | Freq: Once | INTRAMUSCULAR | Status: DC | PRN

## 2017-09-03 MED ORDER — PROPOFOL 10 MG/ML IV BOLUS
INTRAVENOUS | Status: DC | PRN
  Administered 2017-09-03: 100 mg via INTRAVENOUS

## 2017-09-03 MED ORDER — LIDOCAINE HCL (CARDIAC) 20 MG/ML IV SOLN
INTRAVENOUS | Status: DC | PRN
  Administered 2017-09-03: 80 mg via INTRAVENOUS

## 2017-09-03 MED ORDER — SCOPOLAMINE 1 MG/3DAYS TD PT72
1.0000 | MEDICATED_PATCH | Freq: Once | TRANSDERMAL | Status: DC | PRN
Start: 2017-09-03 — End: 2017-09-03

## 2017-09-03 MED ORDER — HYDROMORPHONE HCL 1 MG/ML IJ SOLN: 0.2500 mg | INTRAMUSCULAR | Status: DC | PRN

## 2017-09-03 MED ORDER — TRAMADOL HCL 50 MG PO TABS: 50.0000 mg | ORAL_TABLET | Freq: Four times a day (QID) | ORAL | 0 refills | Status: DC | PRN

## 2017-09-03 MED ORDER — MIDAZOLAM HCL 2 MG/2ML IJ SOLN: 1.0000 mg | INTRAMUSCULAR | Status: DC | PRN

## 2017-09-03 MED ORDER — BUPIVACAINE-EPINEPHRINE 0.25% -1:200000 IJ SOLN
INTRAMUSCULAR | Status: DC | PRN
  Administered 2017-09-03: 10 mL

## 2017-09-03 SURGICAL SUPPLY — 28 items
ADH SKN CLS APL DERMABOND .7 (GAUZE/BANDAGES/DRESSINGS) ×1
BLADE SURG 15 STRL LF DISP TIS (BLADE) ×1 IMPLANT
BLADE SURG 15 STRL SS (BLADE) ×3
CHLORAPREP W/TINT 26ML (MISCELLANEOUS) ×3 IMPLANT
COVER BACK TABLE 60X90IN (DRAPES) ×3 IMPLANT
COVER MAYO STAND STRL (DRAPES) ×3 IMPLANT
DECANTER SPIKE VIAL GLASS SM (MISCELLANEOUS) ×3 IMPLANT
DERMABOND ADVANCED (GAUZE/BANDAGES/DRESSINGS) ×2
DERMABOND ADVANCED .7 DNX12 (GAUZE/BANDAGES/DRESSINGS) ×1 IMPLANT
DRAPE LAPAROTOMY 100X72 PEDS (DRAPES) ×3 IMPLANT
DRAPE UTILITY XL STRL (DRAPES) ×3 IMPLANT
ELECT COATED BLADE 2.86 ST (ELECTRODE) ×2 IMPLANT
ELECT REM PT RETURN 9FT ADLT (ELECTROSURGICAL) ×3
ELECTRODE REM PT RTRN 9FT ADLT (ELECTROSURGICAL) IMPLANT
GLOVE BIO SURGEON STRL SZ7.5 (GLOVE) ×3 IMPLANT
GOWN STRL REUS W/ TWL LRG LVL3 (GOWN DISPOSABLE) ×2 IMPLANT
GOWN STRL REUS W/TWL LRG LVL3 (GOWN DISPOSABLE) ×6
NDL HYPO 25X1 1.5 SAFETY (NEEDLE) ×1 IMPLANT
NEEDLE HYPO 25X1 1.5 SAFETY (NEEDLE) ×3 IMPLANT
PACK BASIN DAY SURGERY FS (CUSTOM PROCEDURE TRAY) ×3 IMPLANT
PENCIL BUTTON HOLSTER BLD 10FT (ELECTRODE) ×2 IMPLANT
SLEEVE SCD COMPRESS KNEE MED (MISCELLANEOUS) ×2 IMPLANT
SUT MON AB 4-0 PC3 18 (SUTURE) ×3 IMPLANT
SUT VIC AB 3-0 SH 27 (SUTURE) ×3
SUT VIC AB 3-0 SH 27X BRD (SUTURE) ×1 IMPLANT
SYR CONTROL 10ML LL (SYRINGE) ×3 IMPLANT
TOWEL OR 17X24 6PK STRL BLUE (TOWEL DISPOSABLE) ×3 IMPLANT
TOWEL OR NON WOVEN STRL DISP B (DISPOSABLE) ×3 IMPLANT

## 2017-09-03 NOTE — Discharge Instructions (Signed)

## 2017-09-03 NOTE — Anesthesia Procedure Notes (Signed)
Procedure Name: LMA Insertion Date/Time: 09/03/2017 10:54 AM Performed by: Signe Colt, CRNA Pre-anesthesia Checklist: Patient identified, Emergency Drugs available, Suction available and Patient being monitored Patient Re-evaluated:Patient Re-evaluated prior to induction Oxygen Delivery Method: Circle system utilized Preoxygenation: Pre-oxygenation with 100% oxygen Induction Type: IV induction Ventilation: Mask ventilation without difficulty LMA: LMA inserted LMA Size: 4.0 Number of attempts: 1 Airway Equipment and Method: Bite block Placement Confirmation: positive ETCO2 Tube secured with: Tape Dental Injury: Teeth and Oropharynx as per pre-operative assessment

## 2017-09-03 NOTE — Op Note (Signed)
09/03/2017  11:23 AM  PATIENT:  Mckenzie Webb  72 y.o. female  PRE-OPERATIVE DIAGNOSIS:  HISTORY OF RIGHT BREAST CANCER  POST-OPERATIVE DIAGNOSIS:  HISTORY OF RIGHT BREAST CANCER  PROCEDURE:  Procedure(s): REMOVAL PORT-A-CATH (N/A)  SURGEON:  Surgeon(s) and Role:    * Jovita Kussmaul, MD - Primary  PHYSICIAN ASSISTANT:   ASSISTANTS: none   ANESTHESIA:   local and general  EBL:  minimal   BLOOD ADMINISTERED:none  DRAINS: none   LOCAL MEDICATIONS USED:  MARCAINE     SPECIMEN:  No Specimen  DISPOSITION OF SPECIMEN:  N/A  COUNTS:  YES  TOURNIQUET:  * No tourniquets in log *  DICTATION: .Dragon Dictation   After informed consent was obtained the patient was brought to the operating room and placed in the supine position on the operating table.  After adequate induction of general anesthesia the patient's left chest was prepped with ChloraPrep, allowed to dry, and draped in usual sterile manner.  An appropriate timeout was performed.  The area around the port was then infiltrated with quarter percent Marcaine.  A small incision was made with a 15 blade knife through her old incision.  The incision was carried through the subcutaneous tissue sharply with the 15 blade knife until the capsule surrounding the port was opened.  The 2 anchoring stitches were divided and removed.  With gentle traction the port was then gently removed from the patient without difficulty.  Pressure was held for several minutes until the area was completely hemostatic.  The deep layer of the wound was then closed with interrupted 3-0 Vicryl stitches.  The skin was then closed with a running 4-0 Monocryl subcuticular stitch.  Dermabond dressings were applied.  The patient tolerated the procedure well.  At the end of the case all needle sponge and instrument counts were correct.  The patient was then awakened and taken to recovery in stable condition.  PLAN OF CARE: Discharge to home after PACU  PATIENT  DISPOSITION:  PACU - hemodynamically stable.   Delay start of Pharmacological VTE agent (>24hrs) due to surgical blood loss or risk of bleeding: not applicable

## 2017-09-03 NOTE — Anesthesia Postprocedure Evaluation (Signed)
Anesthesia Post Note  Patient: Mckenzie Webb  Procedure(s) Performed: REMOVAL PORT-A-CATH (N/A Chest)     Patient location during evaluation: PACU Anesthesia Type: General Level of consciousness: awake and alert Pain management: pain level controlled Vital Signs Assessment: post-procedure vital signs reviewed and stable Respiratory status: spontaneous breathing, nonlabored ventilation, respiratory function stable and patient connected to nasal cannula oxygen Cardiovascular status: blood pressure returned to baseline and stable Postop Assessment: no apparent nausea or vomiting Anesthetic complications: no    Last Vitals:  Vitals:   09/03/17 1145 09/03/17 1200  BP: (!) 161/92 (!) 154/73  Pulse: 98 93  Resp: 19 18  Temp:  36.7 C  SpO2: 97% 98%    Last Pain:  Vitals:   09/03/17 1200  TempSrc:   PainSc: 0-No pain                 Jazilyn Siegenthaler DAVID

## 2017-09-03 NOTE — Interval H&P Note (Signed)
History and Physical Interval Note:  09/03/2017 10:15 AM  [name]  has presented today for surgery, with the diagnosis of HISTORY OF RIGHT BREAST CANCER  The various methods of treatment have been discussed with the patient and family. After consideration of risks, benefits and other options for treatment, the patient has consented to  Procedure(s): REMOVAL PORT-A-CATH (N/A) as a surgical intervention .  The patient's history has been reviewed, patient examined, no change in status, stable for surgery.  I have reviewed the patient's chart and labs.  Questions were answered to the patient's satisfaction.     TOTH III,PAUL S

## 2017-09-03 NOTE — Transfer of Care (Signed)
Immediate Anesthesia Transfer of Care Note  Patient: Mckenzie Webb  Procedure(s) Performed: REMOVAL PORT-A-CATH (N/A Chest)  Patient Location: PACU  Anesthesia Type:General  Level of Consciousness: awake, alert , oriented and patient cooperative  Airway & Oxygen Therapy: Patient Spontanous Breathing and Patient connected to face mask oxygen  Post-op Assessment: Report given to RN and Post -op Vital signs reviewed and stable  Post vital signs: Reviewed and stable  Last Vitals:  Vitals:   09/03/17 0929  BP: (!) 146/73  Pulse: 79  Resp: 18  Temp: 36.7 C  SpO2: 100%    Last Pain:  Vitals:   09/03/17 0929  TempSrc: Oral      Patients Stated Pain Goal: 0 (55/[id number])  Complications: No apparent anesthesia complications

## 2017-09-03 NOTE — H&P (Signed)
Mckenzie Webb  Location: [facility] Patient #: [id number] DOB: [birth year] Married / Language: English / Race: White Female   History of Present Illness  The patient is a 72 year old female who presents for a follow-up for Breast cancer. the patient is a 72 year old white female who is 2 weeks status post right breast lumpectomy and sentinel node mapping for a [id number] right breast cancer that was ER positive and PR negative and HER-2 negative with a Ki-67 of 3%. She had a mammoprint that came back as high risk. Her oncologist has recommended chemotherapy and a Port-A-Cath. She tolerated the surgery well but is still having a lot of soreness in the right breast.   Problem List/Past Medical  MALIGNANT NEOPLASM OF CENTRAL PORTION OF RIGHT BREAST IN FEMALE, ESTROGEN RECEPTOR POSITIVE (C50.111)   Past Surgical History  Breast Biopsy  Right. Gallbladder Surgery - Laparoscopic  Knee Surgery  Bilateral. Oral Surgery  Lumpectomy 03/2017 Right.  Diagnostic Studies History  Colonoscopy  5-10 years ago Mammogram  within last year Pap Smear  1-5 years ago  Allergies  Iodine *CHEMICALS*  Sulfa 10 *OPHTHALMIC AGENTS*  Penicillin V *PENICILLINS*  Polysporin *DERMATOLOGICALS*  Ciprofloxacin *CHEMICALS*  Codeine Phosphate *ANALGESICS - OPIOID*   Medication History  Anastrozole (1MG Tablet, Oral) Active. Calcium-Vitamin D (600MG Capsule, Oral) Active. Vitamin D (1000UNIT Capsule, Oral) Active. Co-Enzyme Q-10 (30MG Capsule, Oral) Active. Vitamin B12 (Oral) Specific strength unknown - Active. Fish Oil Omega-3 (1000MG Capsule, Oral) Active. Levothyroxine Sodium (88MCG Tablet, Oral) Active. Losartan Potassium-HCTZ (50-12.5MG Tablet, Oral) Active. Omeprazole (40MG Capsule DR, Oral) Active. Red Yeast Rice Extract (Oral) Specific strength unknown - Active. Triamcinolone Acetonide (Top) (0.1% Cream, External) Active. Multiple Vitamin (1 (one) Oral)  Active. Diclofenac Sodium (75MG Tablet DR, Oral) Active. Medications Reconciled  Social History  Alcohol use  Occasional alcohol use. Caffeine use  Carbonated beverages, Coffee, Tea. No drug use  Tobacco use  Never smoker.  Family History  Alcohol Abuse  Family Members In General. Arthritis  Family Members In General, Mother, Sister. Cancer  Family Members In General. Cerebrovascular Accident  Sister. Colon Cancer  Family Members In General. Diabetes Mellitus  Sister. Heart Disease  Mother. Malignant Neoplasm Of Pancreas  Father. Respiratory Condition  Sister. Thyroid problems  Mother.  Pregnancy / Birth History Age of menopause  51-57 Contraceptive History  Oral contraceptives. Gravida  2 Irregular periods  Maternal age  70-25Para  2  Other Problems  Anxiety Disorder  Arthritis  Back Pain  Breast Cancer  Cholelithiasis  Gastroesophageal Reflux Disease  General anesthesia - complications  Hemorrhoids  High blood pressure  Lump In Breast  Thyroid Disease  Ulcerative Colitis     Review of Systems  General Not Present- Appetite Loss, Chills, Fatigue, Fever, Night Sweats, Weight Gain and Weight Loss. Skin Present- Dryness. Not Present- Change in Wart/Mole, Hives, Jaundice, New Lesions, Non-Healing Wounds, Rash and Ulcer. HEENT Present- Wears glasses/contact lenses. Not Present- Earache, Hearing Loss, Hoarseness, Nose Bleed, Oral Ulcers, Ringing in the Ears, Seasonal Allergies, Sinus Pain, Sore Throat, Visual Disturbances and Yellow Eyes. Respiratory Present- Snoring. Not Present- Bloody sputum, Chronic Cough, Difficulty Breathing and Wheezing. Breast Not Present- Breast Mass, Breast Pain, Nipple Discharge and Skin Changes. Cardiovascular Not Present- Chest Pain, Difficulty Breathing Lying Down, Leg Cramps, Palpitations, Rapid Heart Rate, Shortness of Breath and Swelling of Extremities. Gastrointestinal Present- Hemorrhoids. Not  Present- Abdominal Pain, Bloating, Bloody Stool, Change in Bowel Habits, Chronic diarrhea, Constipation, Difficulty Swallowing, Excessive gas, Gets full  quickly at meals, Indigestion, Nausea, Rectal Pain and Vomiting. Female Genitourinary Not Present- Frequency, Nocturia, Painful Urination, Pelvic Pain and Urgency. Musculoskeletal Present- Back Pain. Not Present- Joint Pain, Joint Stiffness, Muscle Pain, Muscle Weakness and Swelling of Extremities. Neurological Not Present- Decreased Memory, Fainting, Headaches, Numbness, Seizures, Tingling, Tremor, Trouble walking and Weakness. Psychiatric Present- Anxiety. Not Present- Bipolar, Change in Sleep Pattern, Depression, Fearful and Frequent crying. Hematology Present- Easy Bruising. Not Present- Blood Thinners, Excessive bleeding, Gland problems, HIV and Persistent Infections.  Vitals Weight: 170.13 lb Height: 64.5in Body Surface Area: 1.84 m Body Mass Index: 28.75 kg/m  Pulse: 68 (Regular)  BP: 142/86 (Sitting, Left Arm, Standard)       Physical Exam  General Mental Status-Alert. General Appearance-Consistent with stated age. Hydration-Well hydrated. Voice-Normal.  Head and Neck Head-normocephalic, atraumatic with no lesions or palpable masses. Trachea-midline. Thyroid Gland Characteristics - normal size and consistency.  Eye Eyeball - Bilateral-Extraocular movements intact. Sclera/Conjunctiva - Bilateral-No scleral icterus.  Chest and Lung Exam Chest and lung exam reveals -quiet, even and easy respiratory effort with no use of accessory muscles and on auscultation, normal breath sounds, no adventitious sounds and normal vocal resonance. Inspection Chest Wall - Normal. Back - normal.  Breast Note: the right breast periareolar incision and axillary incision are healing nicely with no sign of infection or significant seroma   Cardiovascular Cardiovascular examination reveals -normal heart  sounds, regular rate and rhythm with no murmurs and normal pedal pulses bilaterally.  Abdomen Inspection Inspection of the abdomen reveals - No Hernias. Skin - Scar - no surgical scars. Palpation/Percussion Palpation and Percussion of the abdomen reveal - Soft, Non Tender, No Rebound tenderness, No Rigidity (guarding) and No hepatosplenomegaly. Auscultation Auscultation of the abdomen reveals - Bowel sounds normal.  Neurologic Neurologic evaluation reveals -alert and oriented x 3 with no impairment of recent or remote memory. Mental Status-Normal.  Musculoskeletal Normal Exam - Left-Upper Extremity Strength Normal and Lower Extremity Strength Normal. Normal Exam - Right-Upper Extremity Strength Normal and Lower Extremity Strength Normal.  Lymphatic Head & Neck  General Head & Neck Lymphatics: Bilateral - Description - Normal. Axillary  General Axillary Region: Bilateral - Description - Normal. Tenderness - Non Tender. Femoral & Inguinal  Generalized Femoral & Inguinal Lymphatics: Bilateral - Description - Normal. Tenderness - Non Tender.    Assessment & Plan  MALIGNANT NEOPLASM OF CENTRAL PORTION OF RIGHT BREAST IN FEMALE, ESTROGEN RECEPTOR POSITIVE (C50.111) Impression: the patient is about 2 weeks status post right breast lumpectomy for breast cancer. She tolerated the surgery well but is still having some soreness in the right breast. I think a sports bra may help with some of the soreness. She also had a mammoprint that was high risk. The oncologist has recommended chemotherapy and she will need a port for this. I have discussed with her in detail the risks and benefits of placing the port as well as some of the technical aspects and she understands and wishes to proceed Current Plans Follow up with [place] in the office in 3 MONTHS.  Call us sooner as needed.

## 2017-09-03 NOTE — Anesthesia Preprocedure Evaluation (Signed)
Anesthesia Evaluation  Patient identified by MRN, date of birth, ID band Patient awake    Reviewed: Allergy & Precautions, NPO status , Patient's Chart, lab work & pertinent test results  History of Anesthesia Complications (+) PONV  Airway Mallampati: I  TM Distance: >3 FB Neck ROM: Full    Dental   Pulmonary    Pulmonary exam normal        Cardiovascular hypertension, Pt. on medications Normal cardiovascular exam     Neuro/Psych    GI/Hepatic GERD  Medicated and Controlled,  Endo/Other    Renal/GU      Musculoskeletal   Abdominal   Peds  Hematology   Anesthesia Other Findings   Reproductive/Obstetrics                             Anesthesia Physical Anesthesia Plan  ASA: II  Anesthesia Plan: General   Post-op Pain Management:    Induction: Intravenous  PONV Risk Score and Plan: 4 or greater and Ondansetron, Midazolam and Dexamethasone  Airway Management Planned: LMA  Additional Equipment:   Intra-op Plan:   Post-operative Plan: Extubation in OR  Informed Consent: I have reviewed the patients History and Physical, chart, labs and discussed the procedure including the risks, benefits and alternatives for the proposed anesthesia with the patient or authorized representative who has indicated his/her understanding and acceptance.     Plan Discussed with: CRNA and Surgeon  Anesthesia Plan Comments:         Anesthesia Quick Evaluation

## 2017-09-04 ENCOUNTER — Encounter (HOSPITAL_BASED_OUTPATIENT_CLINIC_OR_DEPARTMENT_OTHER): Payer: Self-pay | Admitting: General Surgery

## 2017-09-18 ENCOUNTER — Other Ambulatory Visit: Payer: Self-pay | Admitting: Family Medicine

## 2017-09-23 ENCOUNTER — Other Ambulatory Visit: Payer: Self-pay | Admitting: *Deleted

## 2017-09-23 ENCOUNTER — Encounter: Payer: Self-pay | Admitting: *Deleted

## 2017-09-23 DIAGNOSIS — C50111 Malignant neoplasm of central portion of right female breast: Secondary | ICD-10-CM | POA: Diagnosis not present

## 2017-09-23 DIAGNOSIS — Z170 Estrogen receptor positive status [ER+]: Secondary | ICD-10-CM | POA: Diagnosis not present

## 2017-09-25 NOTE — Progress Notes (Signed)
[place]  [phone]) [phone] [phone]) [phone]     ID: Mckenzie Webb DOB: Jul 21, 1946  MR#: [id number]  ZDG#:[id number]  Patient Care Team: Rita Ohara, MD as PCP - General (Family Medicine) Jovita Kussmaul, MD as Consulting Physician (General Surgery) Stevey Stapleton, Virgie Dad, MD as Consulting Physician (Oncology) Eppie Gibson, MD as Attending Physician (Radiation Oncology) Juanita Craver, MD as Consulting Physician (Gastroenterology) Vania Rea, MD as Consulting Physician (Obstetrics and Gynecology) Elsie Saas, MD as Consulting Physician (Orthopedic Surgery) Martinique, Amy, MD as Consulting Physician (Dermatology) OTHER MD:  CHIEF COMPLAINT: Estrogen receptor positive breast cancer  CURRENT TREATMENT: anastrozole  BREAST CANCER HISTORY: From the original intake note:  "Mckenzie Webb" had bilateral screening mammography with tomography at the [facility] 02/21/2017. This showed a possible mass in the right breast. Right diagnostic mammography with tomography and ultrasonography 02/27/2017 found a breast density to be category B. In the subareolar right breast there was a 0.5 cm spiculated mass, which was not palpable. There was mild right nipple inversion, which per report was chronic. Ultrasonography confirmed a 0.5 cm retroareolar breast mass at the 1:00 radiant. The right axilla was sonographically benign.  On 03/03/2017 the patient underwent biopsy of the right breast mass in question. This showed ([id number]) and invasive ductal carcinoma, grade 1, estrogen receptor 100% positive, progesterone receptor negative, with an MIB-1 of 3%, and no HER-2 implication, the signals ratio being 1.25 and the number per cell 2.00.  The patient's subsequent history is as detailed below.  INTERVAL HISTORY: Mckenzie Webb returns today for follow-up and treatment of her estrogen receptor positive breast cancer. She continues on anastrozole, with good tolerance. She notes that she has hot  flashes mostly in the mornings after she gets coffee. She notes that they don't bother her during the day or at night. She denies issues with vaginal dryness.  She completed radiation treatments on 08/28/2017. She tolerated this well.  REVIEW OF SYSTEMS: Mckenzie Webb reports that she is doing well. She notes that her energy levels have increased. She notes that she plays with her grandchild, cleans her house, and goes shopping. She notes that she takes walks 2-3 days per week for about 30 minutes. She denies unusual headaches, visual changes, nausea, vomiting, or dizziness. There has been no unusual cough, phlegm production, or pleurisy. This been no change in bowel or bladder habits. She denies unexplained fatigue or unexplained weight loss, bleeding, rash, or fever. A detailed review of systems was otherwise stable.    PAST MEDICAL HISTORY: Past Medical History:  Diagnosis Date  . Arthritis    lower back  . Cough 07/22/2017  . Dental crowns present   . Family history of adverse reaction to anesthesia    pt's sister has hx. of post-op N/V  . GERD (gastroesophageal reflux disease)   . History of chemotherapy    finished chemo 06/30/2017  . History of radiation therapy 07/29/17- 08/28/17   Right Breast and Axilla treated to 42.56 Gy with 16 fx of 2.66. Boost of 8 Gy with 4 fx of 2 Gy.   Marland Kitchen History of right breast cancer 02/2017  . Hyperlipidemia   . Hypothyroid   . Leukopenia 07/07/2017  . Stuffy and runny nose 07/22/2017   clear drainage from nose, per pt.    PAST SURGICAL HISTORY: Past Surgical History:  Procedure Laterality Date  . BREAST LUMPECTOMY WITH RADIOACTIVE SEED AND SENTINEL LYMPH NODE BIOPSY Right 03/26/2017   Procedure: BREAST LUMPECTOMY WITH RADIOACTIVE SEED AND SENTINEL LYMPH NODE  BIOPSY;  Surgeon: Jovita Kussmaul, MD;  Location: [place];  Service: General;  Laterality: Right;  . CHOLECYSTECTOMY    . COLONOSCOPY  3/09  . CYST EXCISION  02/01/2003   knee  .  KNEE ARTHROSCOPY Right 1990  . KNEE ARTHROSCOPY Left 12/12/2010  . PORT-A-CATH REMOVAL N/A 09/03/2017   Procedure: REMOVAL PORT-A-CATH;  Surgeon: Jovita Kussmaul, MD;  Location: [place];  Service: General;  Laterality: N/A;  . PORTACATH PLACEMENT Left 04/24/2017   Procedure: INSERTION PORT-A-CATH;  Surgeon: Jovita Kussmaul, MD;  Location: [facility];  Service: General;  Laterality: Left;  . TUBAL LIGATION  1983  . VARICOSE VEIN SURGERY  09/2009 R, 06/2010 L    FAMILY HISTORY Family History  Problem Relation Age of Onset  . Hypertension Mother   . Heart disease Mother   . Gallbladder disease Mother   . Thyroid disease Mother   . Arthritis Mother   . Diabetes Father   . Cancer Father        pancreatic  . Hypertension Sister   . Gallbladder disease Sister   . Diabetes Sister   . Diverticulitis Sister        of small intestine  . Cancer Son        testicular cancer  . Stroke Sister        late 66's  . Hypertension Sister   . Pulmonary embolism Sister   . Ulcerative colitis Sister   . COPD Brother        had lung lobe removed for suspected cancer, but was benign; smoker  . Colon cancer Other 36  . Lung cancer Cousin        maternal first cousin - smoker  The patient's father died from pancreatic cancer at the age of 7. The patient's mother died from a myocardial infarction at age 85. The patient had one brother, 3 sisters. The patient's older son was diagnosed with testicular cancer at age 28. A maternal niece was diagnosed with colon cancer at age 66 and a maternal cousin with lung cancer at an unknown age.  GYNECOLOGIC HISTORY:  No LMP recorded. Patient is postmenopausal.  Menarche age 21, first live birth age 46, the patient is [place] P2. she went through the change of life approximately the year 2000. She did not take hormone replacement. She did take oral contraceptives for more than 20 years remotely, with no complications.  SOCIAL HISTORY:  She is a retired  Web designer. Her husband Arnette Norris is an Clinical biochemist, still working part-time. Son Corene Cornea (from the patient's first marriage) lives in [place] and works in Therapist, art. Son Catalina Antigua (from patient second marriage) lives in [place] we are is a Administrator, arts. The patient has one grandchild. She is not a Ambulance person.   ADVANCED DIRECTIVES: Not in place   HEALTH MAINTENANCE: Social History   Tobacco Use  . Smoking status: Never Smoker  . Smokeless tobacco: Never Used  Substance Use Topics  . Alcohol use: Yes    Comment: "very little"  . Drug use: No     Colonoscopy:2009/[facility] and  PAP:  [facility], T score -0.1 (normal)   Allergies  Allergen Reactions  . Adhesive [Tape] Other (See Comments)    SKIN TEARS  . Ciprofloxacin Nausea Only  . Codeine Nausea Only  . Iodine Swelling  . Polysporin [Bacitracin-Polymyxin B] Swelling    OPHTHALMIC - SWELLING OF EYES  . Penicillins Rash       .  Sulfa Antibiotics Itching    Current Outpatient Medications  Medication Sig Dispense Refill  . anastrozole (ARIMIDEX) 1 MG tablet Take 1 mg by mouth daily.    . clobetasol cream (TEMOVATE) 0.05 % APPLY TOPICALLY TWO TIMES DAILY AS NEEDED FOR PSORIASIS. USE SPARINGLY. 60 g 0  . Coenzyme Q10 (CO Q-10) 100 MG CAPS Take by mouth daily.    . hydrochlorothiazide (MICROZIDE) 12.5 MG capsule Take 1 capsule (12.5 mg total) by mouth daily. 30 capsule 1  . levothyroxine (SYNTHROID, LEVOTHROID) 75 MCG tablet Take 1 tablet (75 mcg total) by mouth daily. 90 tablet 0  . loratadine (CLARITIN) 10 MG tablet Take 10 mg by mouth daily as needed for allergies.     . Multiple Vitamins-Minerals (ALIVE WOMENS 50+ PO) Take 1 tablet by mouth daily.     . naproxen sodium (ALEVE) 220 MG tablet Take 220 mg by mouth 2 (two) times daily.    . Omega-3 Fatty Acids (FISH OIL) 1200 MG CAPS Take 1,200 mg by mouth at bedtime.     Marland Kitchen omeprazole (PRILOSEC) 40 MG capsule TAKE ONE CAPSULE BY  MOUTH EVERY DAY 90 capsule 0  . Red Yeast Rice Extract (RED YEAST RICE PO) Take 1,200 mg by mouth 2 (two) times daily.     . traMADol (ULTRAM) 50 MG tablet Take 1-2 tablets (50-100 mg total) by mouth every 6 (six) hours as needed. 10 tablet 0  . venlafaxine XR (EFFEXOR-XR) 75 MG 24 hr capsule Take 1 capsule (75 mg total) by mouth daily with breakfast. 90 capsule 1  . vitamin B-12 (CYANOCOBALAMIN) 1000 MCG tablet Take 1,000 mcg by mouth daily.     No current facility-administered medications for this visit.     OBJECTIVE: Middle-aged white woman in no acute distress  Vitals:   09/29/17 1154  BP: 135/71  Pulse: 78  Resp: 18  Temp: 98 F (36.7 C)  SpO2: 100%     Body mass index is 29.75 kg/m.   Wt Readings from Last 3 Encounters:  09/29/17 173 lb 4.8 oz (78.6 kg)  09/03/17 174 lb 12.8 oz (79.3 kg)  09/01/17 172 lb 6.4 oz (78.2 kg)   ECOG FS:1 - Symptomatic but completely ambulatory   Sclerae unicteric, EOMs intact No cervical or supraclavicular adenopathy Lungs no rales or rhonchi Heart regular rate and rhythm Abd soft, nontender, positive bowel sounds MSK no focal spinal tenderness, no upper extremity lymphedema Neuro: nonfocal, well oriented, appropriate affect Breasts: Right breast is status post lumpectomy and radiation.  The cosmetic result is excellent.  There is no residual erythema.  There is an area of minimal scalyness in the lateral aspect of the incision.  There is no dehiscence and no subjacent induration.  The left breast is unremarkable.  Both axillae are benign     LAB RESULTS:  CMP     Component Value Date/Time   NA 142 08/01/2017 1056   K 4.4 08/01/2017 1056   CL 100 (L) 05/02/2017 2301   CO2 24 08/01/2017 1056   GLUCOSE 89 09/01/2017 1611   GLUCOSE 101 08/01/2017 1056   BUN 11.1 08/01/2017 1056   CREATININE 0.8 08/01/2017 1056   CALCIUM 9.6 08/01/2017 1056   PROT 6.6 08/01/2017 1056   ALBUMIN 3.6 08/01/2017 1056   AST 18 08/01/2017 1056   ALT  17 08/01/2017 1056   ALKPHOS 67 08/01/2017 1056   BILITOT 0.38 08/01/2017 1056   GFRNONAA >60 05/02/2017 2301   GFRAA >60 05/02/2017 2301  No results found for: TOTALPROTELP, ALBUMINELP, A1GS, A2GS, BETS, BETA2SER, GAMS, MSPIKE, SPEI  No results found for: [facility]  Lab Results  Component Value Date   WBC 2.9 (L) 09/29/2017   NEUTROABS 1.6 09/29/2017   HGB 12.7 09/29/2017   HCT 37.4 09/29/2017   MCV 90.9 09/29/2017   PLT 146 09/29/2017      Chemistry      Component Value Date/Time   NA 142 08/01/2017 1056   K 4.4 08/01/2017 1056   CL 100 (L) 05/02/2017 2301   CO2 24 08/01/2017 1056   BUN 11.1 08/01/2017 1056   CREATININE 0.8 08/01/2017 1056      Component Value Date/Time   CALCIUM 9.6 08/01/2017 1056   ALKPHOS 67 08/01/2017 1056   AST 18 08/01/2017 1056   ALT 17 08/01/2017 1056   BILITOT 0.38 08/01/2017 1056       No results found for: LABCA2  No components found for: [id number]  No results for input(s): INR in the last 168 hours.  Urinalysis    Component Value Date/Time   COLORURINE YELLOW 05/02/2017 2201   APPEARANCEUR HAZY (A) 05/02/2017 2201   LABSPEC 1.014 05/02/2017 2201   PHURINE 6.0 05/02/2017 2201   GLUCOSEU NEGATIVE 05/02/2017 2201   HGBUR SMALL (A) 05/02/2017 2201   BILIRUBINUR NEGATIVE 05/02/2017 2201   BILIRUBINUR neg 08/17/2014 1123   KETONESUR NEGATIVE 05/02/2017 2201   PROTEINUR NEGATIVE 05/02/2017 2201   UROBILINOGEN negative 08/17/2014 1123   NITRITE POSITIVE (A) 05/02/2017 2201   LEUKOCYTESUR LARGE (A) 05/02/2017 2201     STUDIES: DEXA scan scheduled for 03/26/2023  ELIGIBLE FOR AVAILABLE RESEARCH PROTOCOL: no  ASSESSMENT: 72 y.o. [place] woman status post right breast upper inner quadrant biopsy 03/04/2015 for a clinical T1a N0, stage IA invasive ductal carcinoma, grade 1, estrogen receptor positive, progesterone receptor negative, with an MIB-1 of 3% and no HER-2 amplification  (1) anastrozole  started neoadjuvantly 03/25/2017, held at the start of chemotherapy, resumed late November 2018  (2) genetics testing 0 03/2017 through the  common hereditary cancer panel offered by [facility] found no deleterious mutations in APC, ATM, AXIN2, BARD1, BMPR1A, BRCA1, BRCA2, BRIP1, CDH1, CDKN2A (p14ARF), CDKN2A (p16INK4a), CHEK2, CTNNA1, DICER1, EPCAM (Deletion/duplication testing only), GREM1 (promoter region deletion/duplication testing only), KIT, MEN1, MLH1, MSH2, MSH3, MSH6, MUTYH, NBN, NF1, NHTL1, PALB2, PDGFRA, PMS2, POLD1, POLE, PTEN, RAD50, RAD51C, RAD51D, SDHB, SDHC, SDHD, SMAD4, SMARCA4. STK11, TP53, TSC1, TSC2, and VHL.  The following genes were evaluated for sequence changes only: SDHA and HOXB13 c.251G>A variant only.    (3) right lumpectomy and sentinel lymph node biopsy 03/26/2017 showed a pT1c pN1, stage IB invasive ductal carcinoma, grade 1, with negative margins  (4) Mammaprint returned high risk, indicating need for adjuvant chemotherapy  (5) chemotherapy consisting of Cytoxan and docetaxel given every 3 weeks 4 beginning on 04/28/2017  (a) changed to Doxorubicin and Cyclophosphamide beginning 05/19/2017, completing 3 cycles 06/30/2017  (b) dose reduced by 20% with the second and third CA treatments  (6) Adjuvant radiation completed 08/28/2017 Site/dose:   1) Right breast and axilla treated to 42.56 Gy with 16 fx of 2.66  2)  boost of 8 Gy with 4 fx of 2 Gy  (7) Anastrozole started in 06/2017  (a) DEXA scan scheduled for 03/25/18  PLAN: Mckenzie Webb is now 6 months out from definitive surgery for her breast cancer.  She has completed all her local treatments and chemotherapy and is tolerating anastrozole well.  The plan will be to  continue anastrozole for a total of 5 years.  She already is scheduled for a bone density scan in July 2019.  I think she is having some carpal tunnel issues.  I suggested she get a wrist splint for the right wrist and use it for a couple of weeks.  If  that solves the problem she can get one for the other hand and if she wishes then I can refer her to an orthopedist for release or she can discontinue the splints.  I reassured her that her nails are going to continue to grow normally but suggest that she start biotin and see if that does not help.  She will see my 34 assistant in 3 months.  She will see me again in 6 months.  She knows to call for any other issues that may develop before the next visit.      Shloime Keilman, Virgie Dad, MD  09/29/17 12:14 PM Medical Oncology and Hematology [facility] [address] [place], [place] [address] Tel. [phone]    Fax. [phone]  This document serves as a record of services personally performed by Lurline Del, MD. It was created on his behalf by Sheron Nightingale, a trained medical scribe. The creation of this record is based on the scribe's personal observations and the provider's statements to them.   I have reviewed the above documentation for accuracy and completeness, and I agree with the above.

## 2017-09-26 ENCOUNTER — Encounter: Payer: Self-pay | Admitting: Radiation Oncology

## 2017-09-29 ENCOUNTER — Telehealth: Payer: Self-pay | Admitting: Oncology

## 2017-09-29 ENCOUNTER — Inpatient Hospital Stay: Payer: Medicare Other | Attending: Oncology | Admitting: Oncology

## 2017-09-29 ENCOUNTER — Inpatient Hospital Stay: Payer: Medicare Other

## 2017-09-29 VITALS — BP 135/71 | HR 78 | Temp 98.0°F | Resp 18 | Ht 64.0 in | Wt 173.3 lb

## 2017-09-29 DIAGNOSIS — Z79811 Long term (current) use of aromatase inhibitors: Secondary | ICD-10-CM

## 2017-09-29 DIAGNOSIS — Z923 Personal history of irradiation: Secondary | ICD-10-CM | POA: Diagnosis not present

## 2017-09-29 DIAGNOSIS — Z170 Estrogen receptor positive status [ER+]: Secondary | ICD-10-CM | POA: Diagnosis not present

## 2017-09-29 DIAGNOSIS — I427 Cardiomyopathy due to drug and external agent: Secondary | ICD-10-CM

## 2017-09-29 DIAGNOSIS — T451X5A Adverse effect of antineoplastic and immunosuppressive drugs, initial encounter: Secondary | ICD-10-CM

## 2017-09-29 DIAGNOSIS — C50211 Malignant neoplasm of upper-inner quadrant of right female breast: Secondary | ICD-10-CM

## 2017-09-29 DIAGNOSIS — C50111 Malignant neoplasm of central portion of right female breast: Secondary | ICD-10-CM | POA: Insufficient documentation

## 2017-09-29 LAB — CBC WITH DIFFERENTIAL/PLATELET
BASOS ABS: 0 10*3/uL (ref 0.0–0.1)
Basophils Relative: 1 %
Eosinophils Absolute: 0.1 10*3/uL (ref 0.0–0.5)
Eosinophils Relative: 4 %
HEMATOCRIT: 37.4 % (ref 34.8–46.6)
Hemoglobin: 12.7 g/dL (ref 11.6–15.9)
LYMPHS PCT: 26 %
Lymphs Abs: 0.8 10*3/uL — ABNORMAL LOW (ref 0.9–3.3)
MCH: 30.9 pg (ref 25.1–34.0)
MCHC: 34 g/dL (ref 31.5–36.0)
MCV: 90.9 fL (ref 79.5–101.0)
MONO ABS: 0.4 10*3/uL (ref 0.1–0.9)
Monocytes Relative: 14 %
NEUTROS ABS: 1.6 10*3/uL (ref 1.5–6.5)
Neutrophils Relative %: 55 %
PLATELETS: 146 10*3/uL (ref 145–400)
RBC: 4.12 MIL/uL (ref 3.70–5.45)
RDW: 12.9 % (ref 11.2–14.5)
WBC: 2.9 10*3/uL — AB (ref 3.9–10.3)

## 2017-09-29 LAB — COMPREHENSIVE METABOLIC PANEL
ALT: 19 U/L (ref 0–55)
AST: 21 U/L (ref 5–34)
Albumin: 3.7 g/dL (ref 3.5–5.0)
Alkaline Phosphatase: 92 U/L (ref 40–150)
Anion gap: 8 (ref 3–11)
BUN: 13 mg/dL (ref 7–26)
CO2: 28 mmol/L (ref 22–29)
Calcium: 9.5 mg/dL (ref 8.4–10.4)
Chloride: 105 mmol/L (ref 98–109)
Creatinine, Ser: 0.78 mg/dL (ref 0.60–1.10)
GFR calc Af Amer: >60 mL/min (ref >60)
GFR calc non Af Amer: >60 mL/min (ref >60)
Glucose, Bld: 92 mg/dL (ref 70–140)
Potassium: 4 mmol/L (ref 3.5–5.1)
Sodium: 141 mmol/L (ref 136–145)
Total Bilirubin: 0.4 mg/dL (ref 0.2–1.2)
Total Protein: 6.8 g/dL (ref 6.4–8.3)

## 2017-09-29 NOTE — Telephone Encounter (Signed)
Gave avs and calendar for august

## 2017-09-30 ENCOUNTER — Ambulatory Visit
Admission: RE | Admit: 2017-09-30 | Discharge: 2017-09-30 | Disposition: A | Discharge status: Home / Self Care | Payer: Medicare Other | Source: Ambulatory Visit | Attending: Radiation Oncology | Admitting: Radiation Oncology

## 2017-09-30 ENCOUNTER — Encounter: Payer: Self-pay | Admitting: Radiation Oncology

## 2017-09-30 VITALS — BP 140/76 | HR 73 | Temp 97.9°F | Ht 64.0 in | Wt 179.4 lb

## 2017-09-30 DIAGNOSIS — Z170 Estrogen receptor positive status [ER+]: Secondary | ICD-10-CM | POA: Diagnosis not present

## 2017-09-30 DIAGNOSIS — Z79899 Other long term (current) drug therapy: Secondary | ICD-10-CM | POA: Diagnosis not present

## 2017-09-30 DIAGNOSIS — Z923 Personal history of irradiation: Secondary | ICD-10-CM | POA: Insufficient documentation

## 2017-09-30 DIAGNOSIS — C50211 Malignant neoplasm of upper-inner quadrant of right female breast: Secondary | ICD-10-CM | POA: Diagnosis not present

## 2017-09-30 HISTORY — DX: Personal history of irradiation: Z92.3

## 2017-09-30 NOTE — Addendum Note (Signed)
Encounter addended by: Espiridion Supinski, Stephani Police, RN on: 09/30/2017 3:15 PM  Actions taken: Charge Capture section accepted

## 2017-09-30 NOTE — Progress Notes (Signed)
Radiation Oncology         [phone] ________________________________  Name: Mckenzie Webb MRN: [id number]  Date: 09/30/2017  DOB: 09/08/1945  Follow-Up Visit Note  Outpatient  CC: Knapp, Eve, MD  Knapp, Eve, MD  Diagnosis and Prior Radiotherapy:    ICD-10-CM   1. Malignant neoplasm of upper-inner quadrant of right breast in female, estrogen receptor positive (HCC) C50.211    Z17.0   Right breast invasive ductal carcinoma, Grade I, spanning 1.2 cm and DCIS, Grade I, ER 100%, PR negative, Her2 neg Cancer Staging Malignant neoplasm of upper-inner quadrant of right breast in female, estrogen receptor positive (HCC) Staging form: Breast, AJCC 8th Edition - Clinical stage from 03/12/2017: Stage IA (cT1a, cN0, cM0, G1, ER: Positive, PR: Positive, HER2: Negative) - Unsigned Staging comments: Staged at breast conference on 7.25.18 - Pathologic: Stage IA (pT1c, pN1mi, cM0, G1, ER: Positive, PR: Negative, HER2: Negative) - Signed by , , MD on 07/15/2017    Radiation treatment dates:   07/29/17 - 08/28/17    Site/dose:   1) Right breast and axilla treated to 42.56 Gy with 16 fx of 2.66  2)  boost of 8 Gy with 4 fx of 2 Gy  CHIEF COMPLAINT: Here for follow-up and surveillance of her right breast cancer  Narrative:  The patient returns today for routine 1 month follow-up.  Endorses soreness through her right axilla since surgery/RT. She denies pain otherwise. Endorses radiaplex BID and anastrozole.                              ALLERGIES:  is allergic to adhesive [tape]; ciprofloxacin; codeine; iodine; polysporin [bacitracin-polymyxin b]; penicillins; and sulfa antibiotics.  Meds: Current Outpatient Medications  Medication Sig Dispense Refill  . anastrozole (ARIMIDEX) 1 MG tablet Take 1 mg by mouth daily.    . clobetasol cream (TEMOVATE) 0.05 % APPLY TOPICALLY TWO TIMES DAILY AS NEEDED FOR PSORIASIS. USE SPARINGLY. 60 g 0  . Coenzyme Q10 (CO Q-10) 100 MG CAPS Take by  mouth daily.    . hydrochlorothiazide (MICROZIDE) 12.5 MG capsule Take 1 capsule (12.5 mg total) by mouth daily. 30 capsule 1  . levothyroxine (SYNTHROID, LEVOTHROID) 75 MCG tablet Take 1 tablet (75 mcg total) by mouth daily. 90 tablet 0  . loratadine (CLARITIN) 10 MG tablet Take 10 mg by mouth daily as needed for allergies.     . Multiple Vitamins-Minerals (ALIVE WOMENS 50+ PO) Take 1 tablet by mouth daily.     . naproxen sodium (ALEVE) 220 MG tablet Take 220 mg by mouth 2 (two) times daily.    . Omega-3 Fatty Acids (FISH OIL) 1200 MG CAPS Take 1,200 mg by mouth at bedtime.     . omeprazole (PRILOSEC) 40 MG capsule TAKE ONE CAPSULE BY MOUTH EVERY DAY 90 capsule 0  . Red Yeast Rice Extract (RED YEAST RICE PO) Take 1,200 mg by mouth 2 (two) times daily.     . traMADol (ULTRAM) 50 MG tablet Take 1-2 tablets (50-100 mg total) by mouth every 6 (six) hours as needed. 10 tablet 0  . venlafaxine XR (EFFEXOR-XR) 75 MG 24 hr capsule Take 1 capsule (75 mg total) by mouth daily with breakfast. 90 capsule 1  . vitamin B-12 (CYANOCOBALAMIN) 1000 MCG tablet Take 1,000 mcg by mouth daily.     No current facility-administered medications for this encounter.     Physical Findings: The patient is in no acute distress.   Patient is alert and oriented.  height is 5' 4" (1.626 m) and weight is 179 lb 6.4 oz (81.4 kg). Her temperature is 97.9 F (36.6 C). Her blood pressure is 140/76 and her pulse is 73. Her oxygen saturation is 100%. .    Satisfactory skin healing in radiotherapy fields. A little residual hyperpigmentation over right breast.    Lab Findings: Lab Results  Component Value Date   WBC 2.9 (L) 09/29/2017   HGB 12.7 09/29/2017   HCT 37.4 09/29/2017   MCV 90.9 09/29/2017   PLT 146 09/29/2017    Radiographic Findings: No results found.  Impression/Plan: Healing well from radiotherapy to the breast tissue.  Continue skin care with topical Vitamin E Oil and / or lotion for at least 2 more months  for further healing.  I encouraged her to continue with yearly mammography as appropriate (for intact breast tissue) and followup with medical oncology. I will see her back on an as-needed basis. I have encouraged her to call if she has any issues or concerns in the future. I wished her the very best.   _____________________________________    , MD  This document serves as a record of services personally performed by  , MD. It was created on his behalf by Jonathan White, a trained medical scribe. The creation of this record is based on the scribe's personal observations and the provider's statements to them. This document has been checked and approved by the attending provider.    

## 2017-09-30 NOTE — Progress Notes (Signed)
Ms. Exline presents for follow up of radiation completed 08/28/17 to her Right Breast. She denies pain except occasional sharp pain to her right breast. Her Right Breast has healed well and she is using radiaplex twice daily. She has another tube at home which she will also finish. She saw Dr. [name] yesterday and will see him again in August. She is taking anastrozole daily. She will also see Survivorship on 12/19/17. She denies any other concerns at this time.   BP 140/76   Pulse 73   Temp 97.9 F (36.6 C)   Ht 5' 4"  (1.626 m)   Wt 179 lb 6.4 oz (81.4 kg)   SpO2 100% Comment: room air  BMI 30.79 kg/m    Wt Readings from Last 3 Encounters:  09/30/17 179 lb 6.4 oz (81.4 kg)  09/29/17 173 lb 4.8 oz (78.6 kg)  09/03/17 174 lb 12.8 oz (79.3 kg)

## 2017-10-26 NOTE — Progress Notes (Signed)
Chief Complaint  Patient presents with  . Hypertension    nonfasting follow up on hypertension.     Patient presents for follow up on hypertension.  She was started on HCTZ 12.51m at her last visit.  She was taken off her losartan HCT when she got very sick with chemo, was dehydrated.  It wasn't replaced with another medication, and BP's at home had been running above goal, so she was started back on HCTZ only.  She had reported that her cough improved after the losartan HCT was stopped, so we avoided the ARB component. She eats bananas in her diet. BP's have been running 100-144.63-75, mostly 120-133/70. She denies muscle cramps or side effects.  Denies headaches, dizziness, chest pain, shortness of breath or edema.  Potassium was normal when labs were checked through oncologist, abut a month after starting HCTZ.    Chemistry      Component Value Date/Time   NA 141 09/29/2017 1137   NA 142 08/01/2017 1056   K 4.0 09/29/2017 1137   K 4.4 08/01/2017 1056   CL 105 09/29/2017 1137   CO2 28 09/29/2017 1137   CO2 24 08/01/2017 1056   BUN 13 09/29/2017 1137   BUN 11.1 08/01/2017 1056   CREATININE 0.78 09/29/2017 1137   CREATININE 0.8 08/01/2017 1056      Component Value Date/Time   CALCIUM 9.5 09/29/2017 1137   CALCIUM 9.6 08/01/2017 1056   ALKPHOS 92 09/29/2017 1137   ALKPHOS 67 08/01/2017 1056   AST 21 09/29/2017 1137   AST 18 08/01/2017 1056   ALT 19 09/29/2017 1137   ALT 17 08/01/2017 1056   BILITOT 0.4 09/29/2017 1137   BILITOT 0.38 08/01/2017 1056     She was noted to have a TSH that was too low on her last labs in January (0.045).  Her thyroid dose was decreased from 843m to 7575m She is due for recheck of TSH today.  Denies changes in energy, weight, hair (growing back!), skin, bowels.  She is having numbness and tingling in her hands at night.  Last night it was really bad in the right thumb and index finger, kept her awake.  Currently all fingers "feel sore".  Ongoing  for about a month.  Other symptoms from chemo resolved, but this hasn't.  PMH, PSH, SH reviewed  Outpatient Encounter Medications as of 10/27/2017  Medication Sig  . anastrozole (ARIMIDEX) 1 MG tablet Take 1 mg by mouth daily.  . Coenzyme Q10 (CO Q-10) 100 MG CAPS Take by mouth daily.  . hydrochlorothiazide (MICROZIDE) 12.5 MG capsule Take 1 capsule (12.5 mg total) by mouth daily.  . lMarland Kitchenvothyroxine (SYNTHROID, LEVOTHROID) 75 MCG tablet Take 1 tablet (75 mcg total) by mouth daily.  . lMarland Kitchenratadine (CLARITIN) 10 MG tablet Take 10 mg by mouth daily as needed for allergies.   . Multiple Vitamins-Minerals (ALIVE WOMENS 50+ PO) Take 1 tablet by mouth daily.   . Omega-3 Fatty Acids (FISH OIL) 1200 MG CAPS Take 1,200 mg by mouth at bedtime.   . oMarland Kitcheneprazole (PRILOSEC) 40 MG capsule TAKE ONE CAPSULE BY MOUTH EVERY DAY  . Red Yeast Rice Extract (RED YEAST RICE PO) Take 1,200 mg by mouth 2 (two) times daily.   . vMarland Kitchennlafaxine XR (EFFEXOR-XR) 75 MG 24 hr capsule Take 1 capsule (75 mg total) by mouth daily with breakfast.  . vitamin B-12 (CYANOCOBALAMIN) 1000 MCG tablet Take 1,000 mcg by mouth daily.  . [DISCONTINUED] hydrochlorothiazide (MICROZIDE) 12.5 MG capsule Take 1 capsule (  12.5 mg total) by mouth daily.  . clobetasol cream (TEMOVATE) 0.05 % APPLY TOPICALLY TWO TIMES DAILY AS NEEDED FOR PSORIASIS. USE SPARINGLY. (Patient not taking: Reported on 10/27/2017)  . naproxen sodium (ALEVE) 220 MG tablet Take 220 mg by mouth 2 (two) times daily.  . [DISCONTINUED] traMADol (ULTRAM) 50 MG tablet Take 1-2 tablets (50-100 mg total) by mouth every 6 (six) hours as needed.   No facility-administered encounter medications on file as of 10/27/2017.    Allergies  Allergen Reactions  . Adhesive [Tape] Other (See Comments)    SKIN TEARS  . Ciprofloxacin Nausea Only  . Codeine Nausea Only  . Iodine Swelling  . Polysporin [Bacitracin-Polymyxin B] Swelling    OPHTHALMIC - SWELLING OF EYES  . Penicillins Rash       .  Sulfa Antibiotics Itching   ROS: no fever, chills, URI symptoms, headaches, dizziness, chest pain. +hand pain and tingling per HPI. No GI or GU complaints. Normal energy, hair/skin/bowels/moods.   PHYSICAL EXAM:  BP 134/74   Pulse 76   Ht 5' 4"  (1.626 m)   Wt 172 lb 12.8 oz (78.4 kg)   BMI 29.66 kg/m   Wt Readings from Last 3 Encounters:  10/27/17 172 lb 12.8 oz (78.4 kg)  09/30/17 179 lb 6.4 oz (81.4 kg)  09/29/17 173 lb 4.8 oz (78.6 kg)   Well developed, well-appearing female, wearing baseball hat, in good spirits HEENT: conjunctiva and sclera are clear, EOMI Neck: No lymphadenopathy, thyromegaly or mass Heart: regular rate and rhythm Lungs: clear bilaterally Back: no spinal or CVA tenderness Extremities: no edema, 2+ pulses.  +phalen--pain in right thumb increased.  Negative Tinel. Skin: normal turgor, no rashes Psych: normal mood, affect, hygiene and grooming  ASSESSMENT/PLAN:  Essential hypertension, benign - well controlled; recent normal electrolytes through onc office. Cont HCTZ - Plan: hydrochlorothiazide (MICROZIDE) 12.5 MG capsule  Hypothyroidism, unspecified type - euthyroid by history; dose decreased and due for recheck today - Plan: TSH, TSH  Pain in both hands - seems to have at least a component of carpal tunnel syndrome on R; cannot r/o other neuropathy. To check with onc re: chemo used    Your hand tingling in the thumb and index finger may possible from carpal tunnel syndrome.  Try wearing a brace at night to see if that prevents the tingling and discomfort. The pain you are having in both hands--check with your oncologist to see if any of the chemo agents cause neuropathy.  If so, given that you finished the treatments, it might gradually improve, vs needing medications to treat neuropathy.

## 2017-10-27 ENCOUNTER — Ambulatory Visit (INDEPENDENT_AMBULATORY_CARE_PROVIDER_SITE_OTHER): Payer: Medicare Other | Admitting: Family Medicine

## 2017-10-27 ENCOUNTER — Encounter: Payer: Self-pay | Admitting: Family Medicine

## 2017-10-27 VITALS — BP 134/74 | HR 76 | Ht 64.0 in | Wt 172.8 lb

## 2017-10-27 DIAGNOSIS — E039 Hypothyroidism, unspecified: Secondary | ICD-10-CM

## 2017-10-27 DIAGNOSIS — M79641 Pain in right hand: Secondary | ICD-10-CM | POA: Diagnosis not present

## 2017-10-27 DIAGNOSIS — I10 Essential (primary) hypertension: Secondary | ICD-10-CM | POA: Diagnosis not present

## 2017-10-27 DIAGNOSIS — M79642 Pain in left hand: Secondary | ICD-10-CM

## 2017-10-27 MED ORDER — HYDROCHLOROTHIAZIDE 12.5 MG PO CAPS: 12.5000 mg | ORAL_CAPSULE | Freq: Every day | ORAL | 3 refills | Status: DC

## 2017-10-27 NOTE — Patient Instructions (Signed)
We are rechecking your thyroid test today to see if the 84mg dose is correct or if it needs to be adjusted. Your blood pressure looks good, continue the 12.5 mg HCTZ.    Your hand tingling in the thumb and index finger may possible from carpal tunnel syndrome.  Try wearing a brace at night to see if that prevents the tingling and discomfort. The pain you are having in both hands--check with your oncologist to see if any of the chemo agents cause neuropathy.  If so, given that you finished the treatments, it might gradually improve, vs needing medications to treat neuropathy.

## 2017-10-28 ENCOUNTER — Encounter: Payer: Self-pay | Admitting: Family Medicine

## 2017-10-28 LAB — TSH: TSH: 1.25 u[IU]/mL (ref 0.450–4.500)

## 2017-11-03 ENCOUNTER — Telehealth: Payer: Self-pay | Admitting: *Deleted

## 2017-11-03 MED ORDER — GABAPENTIN 100 MG PO CAPS: 100.0000 mg | ORAL_CAPSULE | Freq: Two times a day (BID) | ORAL | 6 refills | Status: DC

## 2017-11-03 NOTE — Telephone Encounter (Signed)
Pt called with c/o tingling and pain to her finger tips. Discussed with Mendel Ryder NP. Prescription for gabapentin sent to pharmacy. Discussed with pt. Denies further questions at this time. Pt will keep SCP appt in May and call if no change in neuropathy after being on gabapentin x1 mon.

## 2017-11-14 ENCOUNTER — Telehealth: Payer: Self-pay | Admitting: *Deleted

## 2017-11-14 ENCOUNTER — Encounter: Payer: Self-pay | Admitting: Adult Health

## 2017-11-14 NOTE — Telephone Encounter (Signed)
Pt called and would like to discuss neuropathy and gabapentin with Mendel Ryder. Scheduled appt for 4/1 at 3pm. Confirmed appt. Denies further needs at this time.

## 2017-11-14 NOTE — Progress Notes (Signed)
Submitted auth request for Gabapentin today.  It was approved;  Coverage Start Date:10/15/2017;Coverage End Date:11/14/2018.

## 2017-11-17 ENCOUNTER — Inpatient Hospital Stay: Payer: Medicare Other | Attending: Oncology | Admitting: Adult Health

## 2017-11-17 VITALS — BP 127/65 | HR 83 | Temp 98.1°F | Resp 18 | Ht 64.0 in | Wt 174.0 lb

## 2017-11-17 DIAGNOSIS — Z170 Estrogen receptor positive status [ER+]: Secondary | ICD-10-CM

## 2017-11-17 DIAGNOSIS — C50211 Malignant neoplasm of upper-inner quadrant of right female breast: Secondary | ICD-10-CM

## 2017-11-17 DIAGNOSIS — Z171 Estrogen receptor negative status [ER-]: Secondary | ICD-10-CM

## 2017-11-17 DIAGNOSIS — M79646 Pain in unspecified finger(s): Secondary | ICD-10-CM | POA: Diagnosis not present

## 2017-11-17 DIAGNOSIS — Z923 Personal history of irradiation: Secondary | ICD-10-CM | POA: Diagnosis not present

## 2017-11-17 DIAGNOSIS — Z9221 Personal history of antineoplastic chemotherapy: Secondary | ICD-10-CM | POA: Diagnosis not present

## 2017-11-17 DIAGNOSIS — Z79811 Long term (current) use of aromatase inhibitors: Secondary | ICD-10-CM

## 2017-11-18 ENCOUNTER — Encounter: Payer: Self-pay | Admitting: Adult Health

## 2017-11-18 ENCOUNTER — Telehealth: Payer: Self-pay | Admitting: Adult Health

## 2017-11-18 NOTE — Telephone Encounter (Signed)
Per 4/1 no los

## 2017-11-18 NOTE — Progress Notes (Signed)
[facility]  Telephone:([phone]) [phone] [phone]) [phone]     ID: Mckenzie Webb DOB: 07-25-1946  MR#: [id number]  VQQ#:[id number]  Patient Care Team: Rita Ohara, MD as PCP - General (Family Medicine) Jovita Kussmaul, MD as Consulting Physician (General Surgery) Magrinat, Virgie Dad, MD as Consulting Physician (Oncology) Eppie Gibson, MD as Attending Physician (Radiation Oncology) Juanita Craver, MD as Consulting Physician (Gastroenterology) Vania Rea, MD as Consulting Physician (Obstetrics and Gynecology) Elsie Saas, MD as Consulting Physician (Orthopedic Surgery) Martinique, Amy, MD as Consulting Physician (Dermatology) OTHER MD:  CHIEF COMPLAINT: Estrogen receptor positive breast cancer  CURRENT TREATMENT: anastrozole  BREAST CANCER HISTORY: From the original intake note:  "Mckenzie Webb" had bilateral screening mammography with tomography at the [facility] 02/21/2017. This showed a possible mass in the right breast. Right diagnostic mammography with tomography and ultrasonography 02/27/2017 found a breast density to be category B. In the subareolar right breast there was a 0.5 cm spiculated mass, which was not palpable. There was mild right nipple inversion, which per report was chronic. Ultrasonography confirmed a 0.5 cm retroareolar breast mass at the 1:00 radiant. The right axilla was sonographically benign.  On 03/03/2017 the patient underwent biopsy of the right breast mass in question. This showed (SAA [id number]) and invasive ductal carcinoma, grade 1, estrogen receptor 100% positive, progesterone receptor negative, with an MIB-1 of 3%, and no HER-2 implication, the signals ratio being 1.25 and the number per cell 2.00.  The patient's subsequent history is as detailed below.  INTERVAL HISTORY: Mckenzie Webb returns today for follow-up and treatment of her estrogen receptor positive breast cancer. She continues on anastrozole, daily.  She is here for evaluation of intense  aching in her hands daily.  REVIEW OF SYSTEMS: Mckenzie Webb notes aching in her hands daily.  It is worse in her fingers and worse in the morning.  It improves throughout the day, but never goes away.  She is miserable, and wants to know if she has neuropathy.  She denies any precipitating factors.  She denies any numbness and tingling.  She was told it might be carpal tunnel, but sees no difference with splinting.   PAST MEDICAL HISTORY: Past Medical History:  Diagnosis Date  . Arthritis    lower back  . Cough 07/22/2017  . Dental crowns present   . Family history of adverse reaction to anesthesia    pt's sister has hx. of post-op N/V  . GERD (gastroesophageal reflux disease)   . History of chemotherapy    finished chemo 06/30/2017  . History of radiation therapy 07/29/17- 08/28/17   Right Breast and Axilla treated to 42.56 Gy with 16 fx of 2.66. Boost of 8 Gy with 4 fx of 2 Gy.   Marland Kitchen History of right breast cancer 02/2017  . Hyperlipidemia   . Hypothyroid   . Leukopenia 07/07/2017  . Stuffy and runny nose 07/22/2017   clear drainage from nose, per pt.    PAST SURGICAL HISTORY: Past Surgical History:  Procedure Laterality Date  . BREAST LUMPECTOMY WITH RADIOACTIVE SEED AND SENTINEL LYMPH NODE BIOPSY Right 03/26/2017   Procedure: BREAST LUMPECTOMY WITH RADIOACTIVE SEED AND SENTINEL LYMPH NODE BIOPSY;  Surgeon: Jovita Kussmaul, MD;  Location: [facility];  Service: General;  Laterality: Right;  . CHOLECYSTECTOMY    . COLONOSCOPY  3/09  . CYST EXCISION  02/01/2003   knee  . KNEE ARTHROSCOPY Right 1990  . KNEE ARTHROSCOPY Left 12/12/2010  . PORT-A-CATH REMOVAL N/A 09/03/2017  Procedure: REMOVAL PORT-A-CATH;  Surgeon: Jovita Kussmaul, MD;  Location: [facility];  Service: General;  Laterality: N/A;  . PORTACATH PLACEMENT Left 04/24/2017   Procedure: INSERTION PORT-A-CATH;  Surgeon: Jovita Kussmaul, MD;  Location: [facility];  Service: General;  Laterality: Left;  . TUBAL  LIGATION  1983  . VARICOSE VEIN SURGERY  09/2009 R, 06/2010 L    FAMILY HISTORY Family History  Problem Relation Age of Onset  . Hypertension Mother   . Heart disease Mother   . Gallbladder disease Mother   . Thyroid disease Mother   . Arthritis Mother   . Diabetes Father   . Cancer Father        pancreatic  . Hypertension Sister   . Gallbladder disease Sister   . Diabetes Sister   . Diverticulitis Sister        of small intestine  . Cancer Son        testicular cancer  . Stroke Sister        late 39's  . Hypertension Sister   . Pulmonary embolism Sister   . Ulcerative colitis Sister   . COPD Brother        had lung lobe removed for suspected cancer, but was benign; smoker  . Colon cancer Other 87  . Lung cancer Cousin        maternal first cousin - smoker  The patient's father died from pancreatic cancer at the age of 14. The patient's mother died from a myocardial infarction at age 67. The patient had one brother, 3 sisters. The patient's older son was diagnosed with testicular cancer at age 50. A maternal niece was diagnosed with colon cancer at age 96 and a maternal cousin with lung cancer at an unknown age.  GYNECOLOGIC HISTORY:  No LMP recorded. Patient is postmenopausal.  Menarche age 65, first live birth age 81, the patient is [place] P2. she went through the change of life approximately the year 2000. She did not take hormone replacement. She did take oral contraceptives for more than 20 years remotely, with no complications.  SOCIAL HISTORY:  She is a retired Web designer. Her husband Arnette Norris is an Clinical biochemist, still working part-time. Son Corene Cornea (from the patient's first marriage) lives in [place] and works in Therapist, art. Son Catalina Antigua (from patient second marriage) lives in [place] we are is a Administrator, arts. The patient has one grandchild. She is not a Ambulance person.   ADVANCED DIRECTIVES: Not in place   HEALTH MAINTENANCE: Social History   Tobacco  Use  . Smoking status: Never Smoker  . Smokeless tobacco: Never Used  Substance Use Topics  . Alcohol use: Yes    Comment: "very little"  . Drug use: No     Colonoscopy:2009/[facility] and  PAP:  [facility], T score -0.1 (normal)   Allergies  Allergen Reactions  . Adhesive [Tape] Other (See Comments)    SKIN TEARS  . Ciprofloxacin Nausea Only  . Codeine Nausea Only  . Iodine Swelling  . Polysporin [Bacitracin-Polymyxin B] Swelling    OPHTHALMIC - SWELLING OF EYES  . Penicillins Rash       . Sulfa Antibiotics Itching    Current Outpatient Medications  Medication Sig Dispense Refill  . anastrozole (ARIMIDEX) 1 MG tablet Take 1 mg by mouth daily.    . clobetasol cream (TEMOVATE) 0.05 % APPLY TOPICALLY TWO TIMES DAILY AS NEEDED FOR PSORIASIS. USE SPARINGLY. 60 g 0  .  Coenzyme Q10 (CO Q-10) 100 MG CAPS Take by mouth daily.    Marland Kitchen gabapentin (NEURONTIN) 100 MG capsule Take 1 capsule (100 mg total) by mouth 2 (two) times daily. 60 capsule 6  . hydrochlorothiazide (MICROZIDE) 12.5 MG capsule Take 1 capsule (12.5 mg total) by mouth daily. 90 capsule 3  . levothyroxine (SYNTHROID, LEVOTHROID) 75 MCG tablet Take 1 tablet (75 mcg total) by mouth daily. 90 tablet 0  . loratadine (CLARITIN) 10 MG tablet Take 10 mg by mouth daily as needed for allergies.     . Multiple Vitamins-Minerals (ALIVE WOMENS 50+ PO) Take 1 tablet by mouth daily.     . naproxen sodium (ALEVE) 220 MG tablet Take 220 mg by mouth 2 (two) times daily.    . Omega-3 Fatty Acids (FISH OIL) 1200 MG CAPS Take 1,200 mg by mouth at bedtime.     Marland Kitchen omeprazole (PRILOSEC) 40 MG capsule TAKE ONE CAPSULE BY MOUTH EVERY DAY 90 capsule 0  . Red Yeast Rice Extract (RED YEAST RICE PO) Take 1,200 mg by mouth 2 (two) times daily.     Marland Kitchen venlafaxine XR (EFFEXOR-XR) 75 MG 24 hr capsule Take 1 capsule (75 mg total) by mouth daily with breakfast. 90 capsule 1  . vitamin B-12 (CYANOCOBALAMIN) 1000 MCG tablet Take  1,000 mcg by mouth daily.     No current facility-administered medications for this visit.     OBJECTIVE:   Vitals:   11/17/17 1455  BP: 127/65  Pulse: 83  Resp: 18  Temp: 98.1 F (36.7 C)  SpO2: 99%     Body mass index is 29.87 kg/m.   Wt Readings from Last 3 Encounters:  11/17/17 174 lb (78.9 kg)  10/27/17 172 lb 12.8 oz (78.4 kg)  09/30/17 179 lb 6.4 oz (81.4 kg)   ECOG FS:1 - Symptomatic but completely ambulatory  GENERAL: Patient is a well appearing female in no acute distress HEENT:  Sclerae anicteric.  Oropharynx clear and moist. No ulcerations or evidence of oropharyngeal candidiasis. Neck is supple.  NODES:  No cervical, supraclavicular, or axillary lymphadenopathy palpated.  BREAST EXAM:  Deferred. LUNGS:  Clear to auscultation bilaterally.  No wheezes or rhonchi. HEART:  Regular rate and rhythm. No murmur appreciated. ABDOMEN:  Soft, nontender.  Positive, normoactive bowel sounds. No organomegaly palpated. MSK:  No focal spinal tenderness to palpation. Full range of motion bilaterally in the upper extremities. EXTREMITIES:  No peripheral edema.   SKIN:  Clear with no obvious rashes or skin changes. No nail dyscrasia. NEURO:  Nonfocal. Well oriented.  Appropriate affect.       LAB RESULTS:  CMP     Component Value Date/Time   NA 141 09/29/2017 1137   NA 142 08/01/2017 1056   K 4.0 09/29/2017 1137   K 4.4 08/01/2017 1056   CL 105 09/29/2017 1137   CO2 28 09/29/2017 1137   CO2 24 08/01/2017 1056   GLUCOSE 92 09/29/2017 1137   GLUCOSE 101 08/01/2017 1056   BUN 13 09/29/2017 1137   BUN 11.1 08/01/2017 1056   CREATININE 0.78 09/29/2017 1137   CREATININE 0.8 08/01/2017 1056   CALCIUM 9.5 09/29/2017 1137   CALCIUM 9.6 08/01/2017 1056   PROT 6.8 09/29/2017 1137   PROT 6.6 08/01/2017 1056   ALBUMIN 3.7 09/29/2017 1137   ALBUMIN 3.6 08/01/2017 1056   AST 21 09/29/2017 1137   AST 18 08/01/2017 1056   ALT 19 09/29/2017 1137   ALT 17 08/01/2017 1056    ALKPHOS  92 09/29/2017 1137   ALKPHOS 67 08/01/2017 1056   BILITOT 0.4 09/29/2017 1137   BILITOT 0.38 08/01/2017 1056   GFRNONAA >60 09/29/2017 1137   GFRAA >60 09/29/2017 1137    No results found for: TOTALPROTELP, ALBUMINELP, A1GS, A2GS, BETS, BETA2SER, GAMS, MSPIKE, SPEI  No results found for: Nils Pyle, [facility]  Lab Results  Component Value Date   WBC 2.9 (L) 09/29/2017   NEUTROABS 1.6 09/29/2017   HGB 12.7 09/29/2017   HCT 37.4 09/29/2017   MCV 90.9 09/29/2017   PLT 146 09/29/2017      Chemistry      Component Value Date/Time   NA 141 09/29/2017 1137   NA 142 08/01/2017 1056   K 4.0 09/29/2017 1137   K 4.4 08/01/2017 1056   CL 105 09/29/2017 1137   CO2 28 09/29/2017 1137   CO2 24 08/01/2017 1056   BUN 13 09/29/2017 1137   BUN 11.1 08/01/2017 1056   CREATININE 0.78 09/29/2017 1137   CREATININE 0.8 08/01/2017 1056      Component Value Date/Time   CALCIUM 9.5 09/29/2017 1137   CALCIUM 9.6 08/01/2017 1056   ALKPHOS 92 09/29/2017 1137   ALKPHOS 67 08/01/2017 1056   AST 21 09/29/2017 1137   AST 18 08/01/2017 1056   ALT 19 09/29/2017 1137   ALT 17 08/01/2017 1056   BILITOT 0.4 09/29/2017 1137   BILITOT 0.38 08/01/2017 1056       No results found for: LABCA2  No components found for: ALPFXT024  No results for input(s): INR in the last 168 hours.  Urinalysis    Component Value Date/Time   COLORURINE YELLOW 05/02/2017 2201   APPEARANCEUR HAZY (A) 05/02/2017 2201   LABSPEC 1.014 05/02/2017 2201   PHURINE 6.0 05/02/2017 2201   GLUCOSEU NEGATIVE 05/02/2017 2201   HGBUR SMALL (A) 05/02/2017 2201   BILIRUBINUR NEGATIVE 05/02/2017 2201   BILIRUBINUR neg 08/17/2014 1123   KETONESUR NEGATIVE 05/02/2017 2201   PROTEINUR NEGATIVE 05/02/2017 2201   UROBILINOGEN negative 08/17/2014 1123   NITRITE POSITIVE (A) 05/02/2017 2201   LEUKOCYTESUR LARGE (A) 05/02/2017 2201     STUDIES: DEXA scan scheduled for April 11, 2023  ELIGIBLE FOR AVAILABLE  RESEARCH PROTOCOL: no  ASSESSMENT: 72 y.o. [place] woman status post right breast upper inner quadrant biopsy 03/04/2015 for a clinical T1a N0, stage IA invasive ductal carcinoma, grade 1, estrogen receptor positive, progesterone receptor negative, with an MIB-1 of 3% and no HER-2 amplification  (1) anastrozole started neoadjuvantly 03/12/2017, held at the start of chemotherapy, resumed late November 2018  (2) genetics testing 0 03/2017 through the  common hereditary cancer panel offered by [facility] found no deleterious mutations in APC, ATM, AXIN2, BARD1, BMPR1A, BRCA1, BRCA2, BRIP1, CDH1, CDKN2A (p14ARF), CDKN2A (p16INK4a), CHEK2, CTNNA1, DICER1, EPCAM (Deletion/duplication testing only), GREM1 (promoter region deletion/duplication testing only), KIT, MEN1, MLH1, MSH2, MSH3, MSH6, MUTYH, NBN, NF1, NHTL1, PALB2, PDGFRA, PMS2, POLD1, POLE, PTEN, RAD50, RAD51C, RAD51D, SDHB, SDHC, SDHD, SMAD4, SMARCA4. STK11, TP53, TSC1, TSC2, and VHL.  The following genes were evaluated for sequence changes only: SDHA and HOXB13 c.251G>A variant only.    (3) right lumpectomy and sentinel lymph node biopsy 03/26/2017 showed a pT1c pN1, stage IB invasive ductal carcinoma, grade 1, with negative margins  (4) Mammaprint returned high risk, indicating need for adjuvant chemotherapy  (5) chemotherapy consisting of Cytoxan and docetaxel given every 3 weeks 4 beginning on 04/28/2017  (a) changed to Doxorubicin and Cyclophosphamide beginning 05/19/2017, completing 3 cycles 06/30/2017  (b) dose reduced  by 20% with the second and third CA treatments  (6) Adjuvant radiation completed 08/28/2017 Site/dose:   1) Right breast and axilla treated to 42.56 Gy with 16 fx of 2.66  2)  boost of 8 Gy with 4 fx of 2 Gy  (7) Anastrozole started in 06/2017  (a) DEXA scan scheduled for July 2019  PLAN:  I think her aching fingers could be very well related to the Anastrozole she is taking for her breast cancer.  She will stop  taking the Anastrozole x 2 weeks and we will see if the pain improves.  If it does, then we will discuss changing her anti estrogen therapy.  If it doesn't, then we will have to further evaluate her hand pain.    She has f/u in May for SCP visit.  She will let us know before then how she is doing.    A total of (20) minutes of face-to-face time was spent with this patient with greater than 50% of that time in counseling and care-coordination.   Wilber Bihari, NP  11/18/17 10:09 AM Medical Oncology and Hematology [facility] [address] [place], [place] [address] Tel. [phone]    Fax. [phone]

## 2017-11-20 ENCOUNTER — Other Ambulatory Visit: Payer: Self-pay | Admitting: Family Medicine

## 2017-11-20 DIAGNOSIS — E039 Hypothyroidism, unspecified: Secondary | ICD-10-CM

## 2017-11-27 DIAGNOSIS — Z1211 Encounter for screening for malignant neoplasm of colon: Secondary | ICD-10-CM | POA: Diagnosis not present

## 2017-11-27 DIAGNOSIS — K625 Hemorrhage of anus and rectum: Secondary | ICD-10-CM | POA: Diagnosis not present

## 2017-11-27 DIAGNOSIS — R1033 Periumbilical pain: Secondary | ICD-10-CM | POA: Diagnosis not present

## 2017-11-27 DIAGNOSIS — R159 Full incontinence of feces: Secondary | ICD-10-CM | POA: Diagnosis not present

## 2017-12-01 ENCOUNTER — Other Ambulatory Visit: Payer: Self-pay | Admitting: *Deleted

## 2017-12-01 MED ORDER — GABAPENTIN 100 MG PO CAPS: 100.0000 mg | ORAL_CAPSULE | Freq: Two times a day (BID) | ORAL | 6 refills | Status: DC

## 2017-12-02 ENCOUNTER — Telehealth: Payer: Self-pay | Admitting: *Deleted

## 2017-12-02 NOTE — Telephone Encounter (Signed)
Pt called requesting one more week of AI. She has been off for 2 wks. Informed pt that was not a problem. Pt also relate fingers on bilateral hands are swollen. Discussed with pt that was a s/e from AI. Request pt to call back in one week to assess symptoms. Received verbal understanding. Discussed pt symptoms and request to stay off AI x1 more wk with Mendel Ryder, NP

## 2017-12-05 ENCOUNTER — Other Ambulatory Visit: Payer: Self-pay

## 2017-12-09 ENCOUNTER — Telehealth: Payer: Self-pay | Admitting: *Deleted

## 2017-12-09 NOTE — Telephone Encounter (Signed)
Pt called to give an update on remaining symptoms from anastrozole. Relate hands hurt at night and plans to start taking gabapentin starting tonight. Also relate she is "stiff" if she sits >20 min. Informed she will remain off anastrozole until she sees [facility] on 5/3. Denies further needs at this time.

## 2017-12-10 ENCOUNTER — Other Ambulatory Visit: Payer: Self-pay | Admitting: *Deleted

## 2017-12-10 DIAGNOSIS — M79642 Pain in left hand: Principal | ICD-10-CM

## 2017-12-10 DIAGNOSIS — M79641 Pain in right hand: Secondary | ICD-10-CM

## 2017-12-10 NOTE — Progress Notes (Signed)
Called pt to discuss orders for DG hands per [place]. Pt relate she will have it done prior to seeing Mendel Ryder on 5/3. Denies further needs at this time.

## 2017-12-11 ENCOUNTER — Ambulatory Visit (HOSPITAL_COMMUNITY)
Admission: RE | Admit: 2017-12-11 | Discharge: 2017-12-11 | Disposition: A | Discharge status: Home / Self Care | Payer: Medicare Other | Source: Ambulatory Visit | Attending: Adult Health | Admitting: Adult Health

## 2017-12-11 DIAGNOSIS — M79642 Pain in left hand: Secondary | ICD-10-CM | POA: Insufficient documentation

## 2017-12-11 DIAGNOSIS — M79641 Pain in right hand: Secondary | ICD-10-CM | POA: Diagnosis not present

## 2017-12-14 ENCOUNTER — Other Ambulatory Visit: Payer: Self-pay | Admitting: Family Medicine

## 2017-12-19 ENCOUNTER — Inpatient Hospital Stay: Payer: Medicare Other | Attending: Oncology | Admitting: Adult Health

## 2017-12-19 ENCOUNTER — Encounter: Payer: Self-pay | Admitting: Adult Health

## 2017-12-19 VITALS — BP 137/74 | HR 72 | Temp 98.0°F | Resp 18 | Ht 64.0 in | Wt 174.3 lb

## 2017-12-19 DIAGNOSIS — J449 Chronic obstructive pulmonary disease, unspecified: Secondary | ICD-10-CM

## 2017-12-19 DIAGNOSIS — Z923 Personal history of irradiation: Secondary | ICD-10-CM | POA: Diagnosis not present

## 2017-12-19 DIAGNOSIS — Z170 Estrogen receptor positive status [ER+]: Secondary | ICD-10-CM | POA: Diagnosis not present

## 2017-12-19 DIAGNOSIS — E039 Hypothyroidism, unspecified: Secondary | ICD-10-CM | POA: Diagnosis not present

## 2017-12-19 DIAGNOSIS — Z9221 Personal history of antineoplastic chemotherapy: Secondary | ICD-10-CM | POA: Insufficient documentation

## 2017-12-19 DIAGNOSIS — C50211 Malignant neoplasm of upper-inner quadrant of right female breast: Secondary | ICD-10-CM | POA: Diagnosis not present

## 2017-12-19 DIAGNOSIS — M898X9 Other specified disorders of bone, unspecified site: Secondary | ICD-10-CM

## 2017-12-19 NOTE — Progress Notes (Signed)
CLINIC:  Survivorship   REASON FOR VISIT:  Routine follow-up post-treatment for a recent history of breast cancer.  BRIEF ONCOLOGIC HISTORY:    Malignant neoplasm of upper-inner quadrant of right breast in female, estrogen receptor positive ([place])   03/03/2017 Initial Biopsy    Right breast upper inner quadrant biopsy: IDC, grade 1, ER+, PR negative, Ki-67 3%, HER-2 negative.        03/12/2017 -  Anti-estrogen oral therapy    Anastrozole started neoadjuvantly and held during chemo, restarted in 06/2017      03/26/2017 Surgery    Right breast lumpectomy: IDC, grade 1, 1.2 cm, 1 SLN with micrometastases, 1 negative,  [id number]  mammaprint high risk indicating a need for chemo      04/12/2017 Genetic Testing    Negative genetic testing on the common hereditary cancer panel.  The Hereditary Gene Panel offered by [facility] includes sequencing and/or deletion duplication testing of the following 46 genes: APC, ATM, AXIN2, BARD1, BMPR1A, BRCA1, BRCA2, BRIP1, CDH1, CDKN2A (p14ARF), CDKN2A (p16INK4a), CHEK2, CTNNA1, DICER1, EPCAM (Deletion/duplication testing only), GREM1 (promoter region deletion/duplication testing only), KIT, MEN1, MLH1, MSH2, MSH3, MSH6, MUTYH, NBN, NF1, NHTL1, PALB2, PDGFRA, PMS2, POLD1, POLE, PTEN, RAD50, RAD51C, RAD51D, SDHB, SDHC, SDHD, SMAD4, SMARCA4. STK11, TP53, TSC1, TSC2, and VHL.  The following genes were evaluated for sequence changes only: SDHA and HOXB13 c.251G>A variant only.  The report date is April 12, 2017.       04/28/2017 - 06/30/2017 Adjuvant Chemotherapy    Docetaxel/cyclophosphamide x 1 cycle, couldn't tolerate, then went on to receive Doxorubicin and Cyclophosphamide x 3 cycles (05/19/17-06/30/17)      07/29/2017 - 08/28/2017 Radiation Therapy    Adjuvant radiation with Dr. SIsidore Moos 1) Right breast and axilla treated to 42.56 Gy with 16 fx of 2.66.  2)  boost of 8 Gy with 4 fx of 2 Gy         INTERVAL HISTORY:  [name] meeting to review her survivorship care plan detailing her treatment course for breast cancer, as well as monitoring long-term side effects of that treatment, education regarding health maintenance, screening, and overall wellness and health promotion.     Overall, Ms. GBecklesreports feeling well.  She continues to have aching in her hands.  She says that it started in January.  She saw me recently and we stopped the Anastrozole.  She took a holiday from the Anastrozole, however, her joints are still very achy in her hands. She says now, the bones hurt all over her body.  She is fatigued.  She was seen several years ago by her PCP and RA was ruled out.  She is considering seeing him again.  She does have some occasional breast tenderness.  She also notes some hardness around her nipple.      REVIEW OF SYSTEMS:  Review of Systems  Constitutional: Positive for fatigue. Negative for appetite change, chills, fever and unexpected weight change.  HENT:   Negative for hearing loss and lump/mass.   Eyes: Negative for eye problems and icterus.  Respiratory: Negative for chest tightness, cough and shortness of breath.   Cardiovascular: Negative for chest pain, leg swelling and palpitations.  Gastrointestinal: Negative for abdominal distention, abdominal pain and constipation.  Endocrine: Negative for hot flashes.  Musculoskeletal: Positive for arthralgias.  Neurological: Negative for dizziness, extremity weakness and numbness.  Hematological: Negative for adenopathy. Does not bruise/bleed easily.  Breast: Denies any new  nodularity, masses, tenderness, nipple changes, or nipple discharge.      ONCOLOGY TREATMENT TEAM:  1. Surgeon:  Dr. Marlou Starks at [facility] 2. Medical Oncologist: Dr. [name]  3. Radiation Oncologist: Dr. Isidore Moos    PAST MEDICAL/SURGICAL HISTORY:  Past Medical History:  Diagnosis Date  . Arthritis    lower back  . Cough  07/22/2017  . Dental crowns present   . Family history of adverse reaction to anesthesia    pt's sister has hx. of post-op N/V  . GERD (gastroesophageal reflux disease)   . History of chemotherapy    finished chemo 06/30/2017  . History of radiation therapy 07/29/17- 08/28/17   Right Breast and Axilla treated to 42.56 Gy with 16 fx of 2.66. Boost of 8 Gy with 4 fx of 2 Gy.   Marland Kitchen History of right breast cancer 02/2017  . Hyperlipidemia   . Hypothyroid   . Leukopenia 07/07/2017  . Stuffy and runny nose 07/22/2017   clear drainage from nose, per pt.   Past Surgical History:  Procedure Laterality Date  . BREAST LUMPECTOMY WITH RADIOACTIVE SEED AND SENTINEL LYMPH NODE BIOPSY Right 03/26/2017   Procedure: BREAST LUMPECTOMY WITH RADIOACTIVE SEED AND SENTINEL LYMPH NODE BIOPSY;  Surgeon: Jovita Kussmaul, MD;  Location: [facility];  Service: General;  Laterality: Right;  . CHOLECYSTECTOMY    . COLONOSCOPY  3/09  . CYST EXCISION  02/01/2003   knee  . KNEE ARTHROSCOPY Right 1990  . KNEE ARTHROSCOPY Left 12/12/2010  . PORT-A-CATH REMOVAL N/A 09/03/2017   Procedure: REMOVAL PORT-A-CATH;  Surgeon: Jovita Kussmaul, MD;  Location: [facility];  Service: General;  Laterality: N/A;  . PORTACATH PLACEMENT Left 04/24/2017   Procedure: INSERTION PORT-A-CATH;  Surgeon: Jovita Kussmaul, MD;  Location: [facility];  Service: General;  Laterality: Left;  . TUBAL LIGATION  1983  . VARICOSE VEIN SURGERY  09/2009 R, 06/2010 L     ALLERGIES:  Allergies  Allergen Reactions  . Adhesive [Tape] Other (See Comments)    SKIN TEARS  . Ciprofloxacin Nausea Only  . Codeine Nausea Only  . Iodine Swelling  . Polysporin [Bacitracin-Polymyxin B] Swelling    OPHTHALMIC - SWELLING OF EYES  . Penicillins Rash       . Sulfa Antibiotics Itching     CURRENT MEDICATIONS:  Outpatient Encounter Medications as of 12/19/2017  Medication Sig  . clobetasol cream (TEMOVATE) 0.05 % APPLY TOPICALLY TWO TIMES  DAILY AS NEEDED FOR PSORIASIS. USE SPARINGLY.  Marland Kitchen Coenzyme Q10 (CO Q-10) 100 MG CAPS Take by mouth daily.  Marland Kitchen gabapentin (NEURONTIN) 100 MG capsule Take 1 capsule (100 mg total) by mouth 2 (two) times daily.  . hydrochlorothiazide (MICROZIDE) 12.5 MG capsule Take 1 capsule (12.5 mg total) by mouth daily.  Marland Kitchen levothyroxine (SYNTHROID, LEVOTHROID) 75 MCG tablet TAKE 1 TABLET BY MOUTH EVERY DAY  . loratadine (CLARITIN) 10 MG tablet Take 10 mg by mouth daily as needed for allergies.   . Multiple Vitamins-Minerals (ALIVE WOMENS 50+ PO) Take 1 tablet by mouth daily.   . Omega-3 Fatty Acids (FISH OIL) 1200 MG CAPS Take 1,200 mg by mouth at bedtime.   Marland Kitchen omeprazole (PRILOSEC) 40 MG capsule TAKE 1 CAPSULE BY MOUTH EVERY DAY  . Red Yeast Rice Extract (RED YEAST RICE PO) Take 1,200 mg by mouth 2 (two) times daily.   . vitamin B-12 (CYANOCOBALAMIN) 1000 MCG tablet Take 1,000 mcg by mouth daily.  . [DISCONTINUED] anastrozole (ARIMIDEX) 1 MG  tablet Take 1 mg by mouth daily.  . [DISCONTINUED] naproxen sodium (ALEVE) 220 MG tablet Take 220 mg by mouth 2 (two) times daily.  . [DISCONTINUED] venlafaxine XR (EFFEXOR-XR) 75 MG 24 hr capsule Take 1 capsule (75 mg total) by mouth daily with breakfast. (Patient not taking: Reported on 12/19/2017)   No facility-administered encounter medications on file as of 12/19/2017.      ONCOLOGIC FAMILY HISTORY:  Family History  Problem Relation Age of Onset  . Hypertension Mother   . Heart disease Mother   . Gallbladder disease Mother   . Thyroid disease Mother   . Arthritis Mother   . Diabetes Father   . Cancer Father        pancreatic  . Hypertension Sister   . Gallbladder disease Sister   . Diabetes Sister   . Diverticulitis Sister        of small intestine  . Cancer Son        testicular cancer  . Stroke Sister        late 70's  . Hypertension Sister   . Pulmonary embolism Sister   . Ulcerative colitis Sister   . COPD Brother        had lung lobe removed for  suspected cancer, but was benign; smoker  . Colon cancer Other 7  . Lung cancer Cousin        maternal first cousin - smoker     GENETIC COUNSELING/TESTING: negative  SOCIAL HISTORY:  Social History   Socioeconomic History  . Marital status: Married    Spouse name: Not on file  . Number of children: 2  . Years of education: Not on file  . Highest education level: Not on file  Occupational History  . Occupation: office work for [employer]    Employer: [employer]  Social Needs  . Financial resource strain: Not on file  . Food insecurity:    Worry: Not on file    Inability: Not on file  . Transportation needs:    Medical: Not on file    Non-medical: Not on file  Tobacco Use  . Smoking status: Never Smoker  . Smokeless tobacco: Never Used  Substance and Sexual Activity  . Alcohol use: Yes    Comment: "very little"  . Drug use: No  . Sexual activity: Yes    Partners: Male  Lifestyle  . Physical activity:    Days per week: Not on file    Minutes per session: Not on file  . Stress: Not on file  Relationships  . Social connections:    Talks on phone: Not on file    Gets together: Not on file    Attends religious service: Not on file    Active member of club or organization: Not on file    Attends meetings of clubs or organizations: Not on file    Relationship status: Not on file  . Intimate partner violence:    Fear of current or ex partner: Not on file    Emotionally abused: Not on file    Physically abused: Not on file    Forced sexual activity: Not on file  Other Topics Concern  . Not on file  Social History Narrative   Lives with husband, no pets.  Children in [place], [place] and [place].  1 granddaughter (in [place]). Retired 05/2014.      PHYSICAL EXAMINATION:  Vital Signs:   Vitals:   12/19/17 1400  BP: 137/74  Pulse: 72  Resp: 18  Temp: 98 F (36.7 C)  SpO2: 100%   Filed Weights   12/19/17 1400  Weight: 174 lb 4.8 oz (79.1 kg)    General: Well-nourished, well-appearing female in no acute distress.  She is unaccompanied today.   HEENT: Head is normocephalic.  Pupils equal and reactive to light. Conjunctivae clear without exudate.  Sclerae anicteric. Oral mucosa is pink, moist.  Oropharynx is pink without lesions or erythema.  Lymph: No cervical, supraclavicular, or infraclavicular lymphadenopathy noted on palpation.  Cardiovascular: Regular rate and rhythm.Marland Kitchen Respiratory: Clear to auscultation bilaterally. Chest expansion symmetric; breathing non-labored.  GI: Abdomen soft and round; non-tender, non-distended. Bowel sounds normoactive.  GU: Deferred.  Neuro: No focal deficits. Steady gait.  Psych: Mood and affect normal and appropriate for situation.  Extremities: No edema. MSK: No focal spinal tenderness to palpation.  Full range of motion in bilateral upper extremities Skin: Warm and dry.  LABORATORY DATA:  None for this visit.  DIAGNOSTIC IMAGING:  None for this visit.      ASSESSMENT AND PLAN:  Ms.. Webb is a pleasant 72 y.o. female with Stage IA right breast invasive ductal carcinoma, ER+/PR+/HER2-, diagnosed in 02/2017, treated with lumpectomy, adjuvant chemotherapy, adjuvant radiation therapy, and anti-estrogen therapy with Anastrozole beginning in 02/2017.  She presents to the Survivorship Clinic for our initial meeting and routine follow-up post-completion of treatment for breast cancer.    1. Stage IA right breast cancer:  Mckenzie Webb is continuing to recover from definitive treatment for breast cancer. She will follow-up with her medical oncologist, Dr. [name]  In three months with history and physical exam per surveillance protocol.   Today, a comprehensive survivorship care plan and treatment summary was reviewed with the patient today detailing her breast cancer diagnosis, treatment course, potential late/long-term effects of treatment, appropriate follow-up care with recommendations for the future,  and patient education resources.  A copy of this summary, along with a letter will be sent to the patient's primary care provider via mail/fax/In Basket message after today's visit.    2. Joint aches/bone pain: She has had a holiday from Anastrozole and her pain has persisted.  I recommended that she restart it.  I also ordered a bone scan to rule out any chance of bony metastatic disease.  We also discussed her going back to her PCP for evaluation of possible autoimmune etiology, and ? Referral to rheumatologist.   3. Bone health:  Given Mckenzie Webb age/history of breast cancer and her current treatment regimen including anti-estrogen therapy with Anastrozole, she is at risk for bone demineralization.  She will undergo bone density in 02/2018.  She was given education on specific activities to promote bone health.  4. Cancer screening:  Due to Mckenzie Webb history and her age, she should receive screening for skin cancers, colon cancer, and gynecologic cancers.  The information and recommendations are listed on the patient's comprehensive care plan/treatment summary and were reviewed in detail with the patient.    5. Health maintenance and wellness promotion: Mckenzie Webb was encouraged to consume 5-7 servings of fruits and vegetables per day. We reviewed the "Nutrition Rainbow" handout, as well as the handout "Take Control of Your Health and Reduce Your Cancer Risk" from the [facility].  She was also encouraged to engage in moderate to vigorous exercise for 30 minutes per day most days of the week. We discussed the [employer] fitness program, which is designed for cancer survivors to help them become more physically  fit after cancer treatments.  She was instructed to limit her alcohol consumption and continue to abstain from tobacco use.     6. Support services/counseling: It is not uncommon for this period of the patient's cancer care trajectory to be one of many emotions and stressors.   We discussed an opportunity for her to participate in the next session of [facility] ("Finding Your New Normal") support group series designed for patients after they have completed treatment.   Mckenzie Webb was encouraged to take advantage of our many other support services programs, support groups, and/or counseling in coping with her new life as a cancer survivor after completing anti-cancer treatment.  She was offered support today through active listening and expressive supportive counseling.  She was given information regarding our available services and encouraged to contact me with any questions or for help enrolling in any of our support group/programs.    Dispo:   -Return to cancer center 03/2018 for f/u with Dr. [name] -Mammogram due in 02/2018 -Bone Density 02/2018 -She is welcome to return back to the Survivorship Clinic at any time; no additional follow-up needed at this time.  -Consider referral back to survivorship as a long-term survivor for continued surveillance  A total of (30) minutes of face-to-face time was spent with this patient with greater than 50% of that time in counseling and care-coordination.   Gardenia Phlegm, NP Survivorship Program [facility] [phone]   Note: PRIMARY CARE PROVIDER Rita Ohara, [place] [phone]

## 2017-12-22 ENCOUNTER — Telehealth: Payer: Self-pay | Admitting: Adult Health

## 2017-12-22 NOTE — Telephone Encounter (Signed)
Per 5/3 no los

## 2017-12-24 DIAGNOSIS — Z1211 Encounter for screening for malignant neoplasm of colon: Secondary | ICD-10-CM | POA: Diagnosis not present

## 2017-12-24 LAB — HM COLONOSCOPY

## 2017-12-25 ENCOUNTER — Encounter: Payer: Self-pay | Admitting: *Deleted

## 2017-12-26 ENCOUNTER — Other Ambulatory Visit: Payer: Self-pay | Admitting: Oncology

## 2017-12-29 ENCOUNTER — Other Ambulatory Visit: Payer: Self-pay

## 2017-12-29 DIAGNOSIS — Z170 Estrogen receptor positive status [ER+]: Principal | ICD-10-CM

## 2017-12-29 DIAGNOSIS — C50211 Malignant neoplasm of upper-inner quadrant of right female breast: Secondary | ICD-10-CM

## 2017-12-29 MED ORDER — GABAPENTIN 100 MG PO CAPS: 100.0000 mg | ORAL_CAPSULE | Freq: Two times a day (BID) | ORAL | 3 refills | Status: DC

## 2017-12-31 ENCOUNTER — Other Ambulatory Visit: Payer: Self-pay | Admitting: Family Medicine

## 2017-12-31 DIAGNOSIS — L409 Psoriasis, unspecified: Secondary | ICD-10-CM

## 2017-12-31 NOTE — Telephone Encounter (Signed)
Is this okay to refill? 

## 2018-01-01 DIAGNOSIS — M1711 Unilateral primary osteoarthritis, right knee: Secondary | ICD-10-CM | POA: Diagnosis not present

## 2018-01-05 ENCOUNTER — Other Ambulatory Visit: Payer: Self-pay | Admitting: Adult Health

## 2018-01-05 ENCOUNTER — Ambulatory Visit (HOSPITAL_COMMUNITY)
Admission: RE | Admit: 2018-01-05 | Discharge: 2018-01-05 | Disposition: A | Discharge status: Home / Self Care | Payer: Medicare Other | Source: Ambulatory Visit | Attending: Adult Health | Admitting: Adult Health

## 2018-01-05 ENCOUNTER — Encounter (HOSPITAL_COMMUNITY)
Admission: RE | Admit: 2018-01-05 | Discharge: 2018-01-05 | Disposition: A | Discharge status: Home / Self Care | Payer: Medicare Other | Source: Ambulatory Visit | Attending: Adult Health | Admitting: Adult Health

## 2018-01-05 DIAGNOSIS — C50911 Malignant neoplasm of unspecified site of right female breast: Secondary | ICD-10-CM | POA: Diagnosis not present

## 2018-01-05 DIAGNOSIS — C50211 Malignant neoplasm of upper-inner quadrant of right female breast: Secondary | ICD-10-CM | POA: Diagnosis not present

## 2018-01-05 DIAGNOSIS — Z170 Estrogen receptor positive status [ER+]: Secondary | ICD-10-CM | POA: Diagnosis not present

## 2018-01-05 DIAGNOSIS — M898X9 Other specified disorders of bone, unspecified site: Secondary | ICD-10-CM | POA: Diagnosis not present

## 2018-01-05 DIAGNOSIS — R937 Abnormal findings on diagnostic imaging of other parts of musculoskeletal system: Secondary | ICD-10-CM | POA: Diagnosis not present

## 2018-01-05 MED ORDER — TECHNETIUM TC 99M MEDRONATE IV KIT
21.0000 | PACK | Freq: Once | INTRAVENOUS | Status: AC | PRN
  Administered 2018-01-05: 21 via INTRAVENOUS

## 2018-01-05 NOTE — Progress Notes (Signed)
Reviewed bone scan results with patient.  Dr. [name] is aware of these results.  She will need an MRI spine to fully evaluate.    Mckenzie Bihari, NP

## 2018-01-08 DIAGNOSIS — M1711 Unilateral primary osteoarthritis, right knee: Secondary | ICD-10-CM | POA: Diagnosis not present

## 2018-01-08 DIAGNOSIS — M79642 Pain in left hand: Secondary | ICD-10-CM | POA: Diagnosis not present

## 2018-01-08 DIAGNOSIS — M65331 Trigger finger, right middle finger: Secondary | ICD-10-CM | POA: Diagnosis not present

## 2018-01-08 DIAGNOSIS — M79641 Pain in right hand: Secondary | ICD-10-CM | POA: Diagnosis not present

## 2018-01-08 DIAGNOSIS — M65332 Trigger finger, left middle finger: Secondary | ICD-10-CM | POA: Diagnosis not present

## 2018-01-08 DIAGNOSIS — M65311 Trigger thumb, right thumb: Secondary | ICD-10-CM | POA: Diagnosis not present

## 2018-01-13 ENCOUNTER — Ambulatory Visit (HOSPITAL_COMMUNITY)
Admission: RE | Admit: 2018-01-13 | Discharge: 2018-01-13 | Disposition: A | Discharge status: Home / Self Care | Payer: Medicare Other | Source: Ambulatory Visit | Attending: Adult Health | Admitting: Adult Health

## 2018-01-13 DIAGNOSIS — M899 Disorder of bone, unspecified: Secondary | ICD-10-CM | POA: Insufficient documentation

## 2018-01-13 DIAGNOSIS — M5124 Other intervertebral disc displacement, thoracic region: Secondary | ICD-10-CM | POA: Diagnosis not present

## 2018-01-13 DIAGNOSIS — Z170 Estrogen receptor positive status [ER+]: Secondary | ICD-10-CM | POA: Insufficient documentation

## 2018-01-13 DIAGNOSIS — C50211 Malignant neoplasm of upper-inner quadrant of right female breast: Secondary | ICD-10-CM

## 2018-01-13 DIAGNOSIS — M47812 Spondylosis without myelopathy or radiculopathy, cervical region: Secondary | ICD-10-CM | POA: Diagnosis not present

## 2018-01-13 DIAGNOSIS — M47816 Spondylosis without myelopathy or radiculopathy, lumbar region: Secondary | ICD-10-CM | POA: Diagnosis not present

## 2018-01-13 MED ORDER — GADOBENATE DIMEGLUMINE 529 MG/ML IV SOLN
20.0000 mL | Freq: Once | INTRAVENOUS | Status: AC | PRN
  Administered 2018-01-13: 16 mL via INTRAVENOUS

## 2018-01-14 LAB — POCT I-STAT CREATININE: CREATININE: 0.8 mg/dL (ref 0.44–1.00)

## 2018-01-15 DIAGNOSIS — M1711 Unilateral primary osteoarthritis, right knee: Secondary | ICD-10-CM | POA: Diagnosis not present

## 2018-01-27 DIAGNOSIS — M65332 Trigger finger, left middle finger: Secondary | ICD-10-CM | POA: Diagnosis not present

## 2018-01-27 DIAGNOSIS — M65331 Trigger finger, right middle finger: Secondary | ICD-10-CM | POA: Diagnosis not present

## 2018-01-27 DIAGNOSIS — M65311 Trigger thumb, right thumb: Secondary | ICD-10-CM | POA: Diagnosis not present

## 2018-01-27 DIAGNOSIS — G5603 Carpal tunnel syndrome, bilateral upper limbs: Secondary | ICD-10-CM | POA: Diagnosis not present

## 2018-02-23 DIAGNOSIS — M47816 Spondylosis without myelopathy or radiculopathy, lumbar region: Secondary | ICD-10-CM | POA: Diagnosis not present

## 2018-02-23 DIAGNOSIS — M48062 Spinal stenosis, lumbar region with neurogenic claudication: Secondary | ICD-10-CM | POA: Diagnosis not present

## 2018-02-23 DIAGNOSIS — Z6831 Body mass index (BMI) 31.0-31.9, adult: Secondary | ICD-10-CM | POA: Diagnosis not present

## 2018-02-23 DIAGNOSIS — M5136 Other intervertebral disc degeneration, lumbar region: Secondary | ICD-10-CM | POA: Diagnosis not present

## 2018-02-23 DIAGNOSIS — M546 Pain in thoracic spine: Secondary | ICD-10-CM | POA: Diagnosis not present

## 2018-02-23 DIAGNOSIS — I10 Essential (primary) hypertension: Secondary | ICD-10-CM | POA: Diagnosis not present

## 2018-02-23 DIAGNOSIS — M5441 Lumbago with sciatica, right side: Secondary | ICD-10-CM | POA: Diagnosis not present

## 2018-02-23 DIAGNOSIS — M5442 Lumbago with sciatica, left side: Secondary | ICD-10-CM | POA: Diagnosis not present

## 2018-02-24 ENCOUNTER — Ambulatory Visit
Admission: RE | Admit: 2018-02-24 | Discharge: 2018-02-24 | Disposition: A | Discharge status: Home / Self Care | Payer: Medicare Other | Source: Ambulatory Visit | Attending: Adult Health | Admitting: Adult Health

## 2018-02-24 DIAGNOSIS — Z780 Asymptomatic menopausal state: Secondary | ICD-10-CM | POA: Diagnosis not present

## 2018-02-24 DIAGNOSIS — C50211 Malignant neoplasm of upper-inner quadrant of right female breast: Secondary | ICD-10-CM

## 2018-02-24 DIAGNOSIS — Z170 Estrogen receptor positive status [ER+]: Principal | ICD-10-CM

## 2018-02-24 DIAGNOSIS — E2839 Other primary ovarian failure: Secondary | ICD-10-CM

## 2018-02-24 DIAGNOSIS — Z13820 Encounter for screening for osteoporosis: Secondary | ICD-10-CM | POA: Diagnosis not present

## 2018-02-24 DIAGNOSIS — R928 Other abnormal and inconclusive findings on diagnostic imaging of breast: Secondary | ICD-10-CM | POA: Diagnosis not present

## 2018-02-24 HISTORY — DX: Personal history of antineoplastic chemotherapy: Z92.21

## 2018-02-24 HISTORY — DX: Personal history of irradiation: Z92.3

## 2018-02-26 ENCOUNTER — Telehealth: Payer: Self-pay

## 2018-02-26 NOTE — Telephone Encounter (Signed)
Spoke with patient to inform of normal bone density results.  Patient voiced understanding and thanks for call.

## 2018-02-26 NOTE — Telephone Encounter (Signed)
-----   Message from Gardenia Phlegm, NP sent at 02/26/2018 12:48 PM EDT ----- Bone density is normal, please notify patient ----- Message ----- From: Interface, Rad Results In Sent: 02/24/2018   1:31 PM To: Gardenia Phlegm, NP

## 2018-03-05 DIAGNOSIS — M5136 Other intervertebral disc degeneration, lumbar region: Secondary | ICD-10-CM | POA: Diagnosis not present

## 2018-03-05 DIAGNOSIS — M48061 Spinal stenosis, lumbar region without neurogenic claudication: Secondary | ICD-10-CM | POA: Diagnosis not present

## 2018-03-05 DIAGNOSIS — M4726 Other spondylosis with radiculopathy, lumbar region: Secondary | ICD-10-CM | POA: Diagnosis not present

## 2018-03-10 DIAGNOSIS — M65311 Trigger thumb, right thumb: Secondary | ICD-10-CM | POA: Diagnosis not present

## 2018-03-10 DIAGNOSIS — G5601 Carpal tunnel syndrome, right upper limb: Secondary | ICD-10-CM | POA: Diagnosis not present

## 2018-03-26 ENCOUNTER — Other Ambulatory Visit: Payer: Self-pay | Admitting: Oncology

## 2018-04-07 DIAGNOSIS — Z170 Estrogen receptor positive status [ER+]: Secondary | ICD-10-CM | POA: Diagnosis not present

## 2018-04-07 DIAGNOSIS — C50111 Malignant neoplasm of central portion of right female breast: Secondary | ICD-10-CM | POA: Diagnosis not present

## 2018-04-08 NOTE — Progress Notes (Signed)
Mckenzie Webb  [phone] [phone]) [phone]     ID: Mckenzie Webb DOB: 1946/05/11  MR#: [id number]  WIO#:[id number]  Patient Care Team: Rita Ohara, MD as PCP - General (Family Medicine) Jovita Kussmaul, MD as Consulting Physician (General Surgery) Magrinat, Virgie Dad, MD as Consulting Physician (Oncology) Eppie Gibson, MD as Attending Physician (Radiation Oncology) Juanita Craver, MD as Consulting Physician (Gastroenterology) Vania Rea, MD as Consulting Physician (Obstetrics and Gynecology) Elsie Saas, MD as Consulting Physician (Orthopedic Surgery) Martinique, Amy, MD as Consulting Physician (Dermatology) Delice Bison, Charlestine Massed, NP as Nurse Practitioner (Hematology and Oncology) OTHER MD:  CHIEF COMPLAINT: Estrogen receptor positive breast cancer  CURRENT TREATMENT: [anastrozole]  BREAST CANCER HISTORY: From the original intake note:  "Mckenzie Webb" had bilateral screening mammography with tomography at the [facility] 02/21/2017. This showed a possible mass in the right breast. Right diagnostic mammography with tomography and ultrasonography 02/27/2017 found a breast density to be category B. In the subareolar right breast there was a 0.5 cm spiculated mass, which was not palpable. There was mild right nipple inversion, which per report was chronic. Ultrasonography confirmed a 0.5 cm retroareolar breast mass at the 1:00 radiant. The right axilla was sonographically benign.  On 03/03/2017 the patient underwent biopsy of the right breast mass in question. This showed (SAA [phone]) and invasive ductal carcinoma, grade 1, estrogen receptor 100% positive, progesterone receptor negative, with an MIB-1 of 3%, and no HER-2 implication, the signals ratio being 1.25 and the number per cell 2.00.  The patient's subsequent history is as detailed below.  INTERVAL HISTORY: Mckenzie Webb returns today for follow-up and treatment of her estrogen receptor positive breast cancer.  She continues on anastrozole, with fair tolerance. She denies issues with hot flashes or vaginal dryness.  However she describes herself as very fatigued "like I am 72 years old".  Since her last visit, she underwent diagnostic bilateral mammography with CAD and tomography on 02/24/2018 at [facility] showing: breast density category B. There was no evidence of malignancy.   Bone density on 02/24/2018 showed a T-score of -0.6 normal.  She completed a bone scan on 01/05/2018 showing: Abnormal uptake within the spine at approximately T11 and L2, concerning for osseous metastases.  She also had a spinal MRI on 01/13/2018: MRI study does not suggest that the bone scan abnormalities are due to metastatic disease but rather that they are secondary to ordinary degenerative changes of the spine.   REVIEW OF SYSTEMS: Mckenzie Webb reports that she has a tension aches on the back of her neck. She also feels tired frequently and she doesn't sleep well. She wakes up with daylight everyday. She does not have night hot flashes, but she sometimes gets up to urinate at night.  She denies unusual headaches, visual changes, nausea, vomiting, or dizziness. There has been no unusual cough, phlegm production, or pleurisy. There has been no change in bowel or bladder habits. She denies unexplained fatigue or unexplained weight loss, bleeding, rash, or fever. A detailed review of systems was otherwise stable.    PAST MEDICAL HISTORY: Past Medical History:  Diagnosis Date  . Arthritis    lower back  . Cough 07/22/2017  . Dental crowns present   . Family history of adverse reaction to anesthesia    pt's sister has hx. of post-op N/V  . GERD (gastroesophageal reflux disease)   . History of chemotherapy    finished chemo 06/30/2017  . History of radiation therapy 07/29/17- 08/28/17   Right  Breast and Axilla treated to 42.56 Gy with 16 fx of 2.66. Boost of 8 Gy with 4 fx of 2 Gy.   Marland Kitchen History of right breast cancer  02/2017  . Hyperlipidemia   . Hypothyroid   . Leukopenia 07/07/2017  . Personal history of chemotherapy   . Personal history of radiation therapy   . Stuffy and runny nose 07/22/2017   clear drainage from nose, per pt.    PAST SURGICAL HISTORY: Past Surgical History:  Procedure Laterality Date  . BREAST LUMPECTOMY Right 03/26/2017  . BREAST LUMPECTOMY WITH RADIOACTIVE SEED AND SENTINEL LYMPH NODE BIOPSY Right 03/26/2017   Procedure: BREAST LUMPECTOMY WITH RADIOACTIVE SEED AND SENTINEL LYMPH NODE BIOPSY;  Surgeon: Jovita Kussmaul, MD;  Location: [facility];  Service: General;  Laterality: Right;  . CHOLECYSTECTOMY    . COLONOSCOPY  3/09  . CYST EXCISION  02/01/2003   knee  . KNEE ARTHROSCOPY Right 1990  . KNEE ARTHROSCOPY Left 12/12/2010  . PORT-A-CATH REMOVAL N/A 09/03/2017   Procedure: REMOVAL PORT-A-CATH;  Surgeon: Jovita Kussmaul, MD;  Location: [facility];  Service: General;  Laterality: N/A;  . PORTACATH PLACEMENT Left 04/24/2017   Procedure: INSERTION PORT-A-CATH;  Surgeon: Jovita Kussmaul, MD;  Location: [facility];  Service: General;  Laterality: Left;  . TUBAL LIGATION  1983  . VARICOSE VEIN SURGERY  09/2009 R, 06/2010 L    FAMILY HISTORY Family History  Problem Relation Age of Onset  . Hypertension Mother   . Heart disease Mother   . Gallbladder disease Mother   . Thyroid disease Mother   . Arthritis Mother   . Diabetes Father   . Cancer Father        pancreatic  . Hypertension Sister   . Gallbladder disease Sister   . Diabetes Sister   . Diverticulitis Sister        of small intestine  . Cancer Son        testicular cancer  . Stroke Sister        late 74's  . Hypertension Sister   . Pulmonary embolism Sister   . Ulcerative colitis Sister   . COPD Brother        had lung lobe removed for suspected cancer, but was benign; smoker  . Colon cancer Other 71  . Lung cancer Cousin        maternal first cousin - smoker  The patient's father  died from pancreatic cancer at the age of 2. The patient's mother died from a myocardial infarction at age 24. The patient had one brother, 3 sisters. The patient's older son was diagnosed with testicular cancer at age 72. A maternal niece was diagnosed with colon cancer at age 29 and a maternal cousin with lung cancer at an unknown age.  GYNECOLOGIC HISTORY:  No LMP recorded. Patient is postmenopausal.  Menarche age 33, first live birth age 60, the patient is [place] P2. she went through the change of life approximately the year 2000. She did not take hormone replacement. She did take oral contraceptives for more than 20 years remotely, with no complications.  SOCIAL HISTORY:  She is a retired Web designer. Her husband Arnette Norris is an Clinical biochemist, still working part-time. Son Corene Cornea (from the patient's first marriage) lives in [place] and works in Therapist, art. Son Catalina Antigua (from patient second marriage) lives in [place] we are is a Administrator, arts. The patient has one grandchild. She is not a Ambulance person.  ADVANCED DIRECTIVES: Not in place   HEALTH MAINTENANCE: Social History   Tobacco Use  . Smoking status: Never Smoker  . Smokeless tobacco: Never Used  Substance Use Topics  . Alcohol use: Yes    Comment: "very little"  . Drug use: No     Colonoscopy: April 2019/ Dr. Mann/ normal  PAP:  Bone density: 12/27/2014 at the [facility], T score -0.1 (normal)   Allergies  Allergen Reactions  . Adhesive [Tape] Other (See Comments)    SKIN TEARS  . Ciprofloxacin Nausea Only  . Codeine Nausea Only  . Iodine Swelling  . Polysporin [Bacitracin-Polymyxin B] Swelling    OPHTHALMIC - SWELLING OF EYES  . Penicillins Rash       . Sulfa Antibiotics Itching    Current Outpatient Medications  Medication Sig Dispense Refill  . anastrozole (ARIMIDEX) 1 MG tablet TAKE 1 TABLET(1 MG) BY MOUTH DAILY 90 tablet 0  . clobetasol cream (TEMOVATE) 0.05 % APPLY TOPICALLY TWO TIMES DAILY AS  NEEDED FOR PSORIASIS. USE SPARINGLY. 60 g 0  . Coenzyme Q10 (CO Q-10) 100 MG CAPS Take by mouth daily.    Marland Kitchen gabapentin (NEURONTIN) 100 MG capsule Take 1 capsule (100 mg total) by mouth 2 (two) times daily. 180 capsule 3  . hydrochlorothiazide (MICROZIDE) 12.5 MG capsule Take 1 capsule (12.5 mg total) by mouth daily. 90 capsule 3  . levothyroxine (SYNTHROID, LEVOTHROID) 75 MCG tablet TAKE 1 TABLET BY MOUTH EVERY DAY 90 tablet 1  . loratadine (CLARITIN) 10 MG tablet Take 10 mg by mouth daily as needed for allergies.     . Multiple Vitamins-Minerals (ALIVE WOMENS 50+ PO) Take 1 tablet by mouth daily.     . Omega-3 Fatty Acids (FISH OIL) 1200 MG CAPS Take 1,200 mg by mouth at bedtime.     Marland Kitchen omeprazole (PRILOSEC) 40 MG capsule TAKE 1 CAPSULE BY MOUTH EVERY DAY 90 capsule 1  . Red Yeast Rice Extract (RED YEAST RICE PO) Take 1,200 mg by mouth 2 (two) times daily.     . vitamin B-12 (CYANOCOBALAMIN) 1000 MCG tablet Take 1,000 mcg by mouth daily.     No current facility-administered medications for this visit.     OBJECTIVE: Middle-aged white woman who appears well  Vitals:   04/09/18 1524  BP: (!) 177/82  Pulse: 73  Resp: 18  Temp: 97.8 F (36.6 C)  SpO2: 100%     Body mass index is 30.93 kg/m.   Wt Readings from Last 3 Encounters:  04/09/18 180 lb 3.2 oz (81.7 kg)  12/19/17 174 lb 4.8 oz (79.1 kg)  11/17/17 174 lb (78.9 kg)   ECOG FS:1 - Symptomatic but completely ambulatory   Sclerae unicteric, pupils round and equal Oropharynx clear and moist No cervical or supraclavicular adenopathy Lungs no rales or rhonchi Heart regular rate and rhythm Abd soft, nontender, positive bowel sounds MSK no focal spinal tenderness, no upper extremity lymphedema Neuro: nonfocal, well oriented, appropriate affect Breasts: The right breast is status post lumpectomy and radiation.  There is still some hyperpigmentation.  There is no evidence of disease recurrence.  Left breast is benign.  Both axillae  are benign.  LAB RESULTS:  CMP     Component Value Date/Time   NA 142 04/09/2018 1459   NA 142 08/01/2017 1056   K 4.2 04/09/2018 1459   K 4.4 08/01/2017 1056   CL 103 04/09/2018 1459   CO2 31 04/09/2018 1459   CO2 24 08/01/2017 1056  GLUCOSE 98 04/09/2018 1459   GLUCOSE 101 08/01/2017 1056   BUN 14 04/09/2018 1459   BUN 11.1 08/01/2017 1056   CREATININE 0.90 04/09/2018 1459   CREATININE 0.8 08/01/2017 1056   CALCIUM 10.1 04/09/2018 1459   CALCIUM 9.6 08/01/2017 1056   PROT 7.6 04/09/2018 1459   PROT 6.6 08/01/2017 1056   ALBUMIN 3.9 04/09/2018 1459   ALBUMIN 3.6 08/01/2017 1056   AST 20 04/09/2018 1459   AST 18 08/01/2017 1056   ALT 28 04/09/2018 1459   ALT 17 08/01/2017 1056   ALKPHOS 72 04/09/2018 1459   ALKPHOS 67 08/01/2017 1056   BILITOT 0.4 04/09/2018 1459   BILITOT 0.38 08/01/2017 1056   GFRNONAA >60 04/09/2018 1459   GFRAA >60 04/09/2018 1459    No results found for: TOTALPROTELP, ALBUMINELP, A1GS, A2GS, BETS, BETA2SER, GAMS, MSPIKE, SPEI  No results found for: Nils Pyle, [facility]  Lab Results  Component Value Date   WBC 5.7 04/09/2018   NEUTROABS 3.8 04/09/2018   HGB 14.2 04/09/2018   HCT 41.8 04/09/2018   MCV 93.9 04/09/2018   PLT 168 04/09/2018      Chemistry      Component Value Date/Time   NA 142 04/09/2018 1459   NA 142 08/01/2017 1056   K 4.2 04/09/2018 1459   K 4.4 08/01/2017 1056   CL 103 04/09/2018 1459   CO2 31 04/09/2018 1459   CO2 24 08/01/2017 1056   BUN 14 04/09/2018 1459   BUN 11.1 08/01/2017 1056   CREATININE 0.90 04/09/2018 1459   CREATININE 0.8 08/01/2017 1056      Component Value Date/Time   CALCIUM 10.1 04/09/2018 1459   CALCIUM 9.6 08/01/2017 1056   ALKPHOS 72 04/09/2018 1459   ALKPHOS 67 08/01/2017 1056   AST 20 04/09/2018 1459   AST 18 08/01/2017 1056   ALT 28 04/09/2018 1459   ALT 17 08/01/2017 1056   BILITOT 0.4 04/09/2018 1459   BILITOT 0.38 08/01/2017 1056       No results found  for: LABCA2  No components found for: [id number]  No results for input(s): INR in the last 168 hours.  Urinalysis    Component Value Date/Time   COLORURINE YELLOW 05/02/2017 2201   APPEARANCEUR HAZY (A) 05/02/2017 2201   LABSPEC 1.014 05/02/2017 2201   PHURINE 6.0 05/02/2017 2201   GLUCOSEU NEGATIVE 05/02/2017 2201   HGBUR SMALL (A) 05/02/2017 2201   BILIRUBINUR NEGATIVE 05/02/2017 2201   BILIRUBINUR neg 08/17/2014 1123   KETONESUR NEGATIVE 05/02/2017 2201   PROTEINUR NEGATIVE 05/02/2017 2201   UROBILINOGEN negative 08/17/2014 1123   NITRITE POSITIVE (A) 05/02/2017 2201   LEUKOCYTESUR LARGE (A) 05/02/2017 2201     STUDIES:  Since her last visit, she underwent diagnostic bilateral mammography with CAD and tomography on 02/24/2018 at [facility] showing: breast density category B. There was no evidence of malignancy.   Bone density on 02/24/2018 showed a T-score of -0.6 normal.  She completed a bone scan on 01/05/2018 showing: Abnormal uptake within the spine at approximately T11 and L2, concerning for osseous metastases.  She also had a spinal MRI on 01/13/2018: MRI study does not suggest that the bone scan abnormalities are due to metastatic disease but rather that they are secondary to ordinary degenerative changes of the spine.   ELIGIBLE FOR AVAILABLE RESEARCH PROTOCOL: no  ASSESSMENT: 72 y.o. [place] woman status post right breast upper inner quadrant biopsy 03/04/2015 for a clinical T1a N0, stage IA invasive ductal carcinoma, grade  1, estrogen receptor positive, progesterone receptor negative, with an MIB-1 of 3% and no HER-2 amplification  (1) anastrozole started neoadjuvantly 03/12/2017, held at the start of chemotherapy, resumed late November 2018  (2) genetics testing 0 03/2017 through the  common hereditary cancer panel offered by [facility] found no deleterious mutations in APC, ATM, AXIN2, BARD1, BMPR1A, BRCA1, BRCA2, BRIP1, CDH1, CDKN2A (p14ARF), CDKN2A  (p16INK4a), CHEK2, CTNNA1, DICER1, EPCAM (Deletion/duplication testing only), GREM1 (promoter region deletion/duplication testing only), KIT, MEN1, MLH1, MSH2, MSH3, MSH6, MUTYH, NBN, NF1, NHTL1, PALB2, PDGFRA, PMS2, POLD1, POLE, PTEN, RAD50, RAD51C, RAD51D, SDHB, SDHC, SDHD, SMAD4, SMARCA4. STK11, TP53, TSC1, TSC2, and VHL.  The following genes were evaluated for sequence changes only: SDHA and HOXB13 c.251G>A variant only.    (3) right lumpectomy and sentinel lymph node biopsy 03/26/2017 showed a pT1c pN1, stage IB invasive ductal carcinoma, grade 1, with negative margins  (4) Mammaprint returned high risk, indicating need for adjuvant chemotherapy  (5) chemotherapy consisting of Cytoxan and docetaxel given every 3 weeks 4 beginning on 04/28/2017  (a) changed to Doxorubicin and Cyclophosphamide beginning 05/19/2017, completing 3 cycles 06/30/2017  (b) dose reduced by 20% with the second and third CA treatments  (6) Adjuvant radiation completed 08/28/2017 Site/dose:   1) Right breast and axilla treated to 42.56 Gy with 16 fx of 2.66  2)  boost of 8 Gy with 4 fx of 2 Gy  (7) Anastrozole started in 06/2017  (a) DEXA scan 02/24/2018 showed a T score of -0.6 normal  (b) anastrozole stopped 04/09/2018--symptom trial   PLAN: Baker Janus is now a year out from definitive surgery for her breast cancer with no evidence of disease recurrence.  This is favorable.  She has tolerated the anastrozole moderately well.  However she describes significant fatigue, even though she is able to accomplish all her activities of daily living.  She does not describe significant arthralgias or myalgias.  I do think the fatigue is at least partly due to the anastrozole.  We are going to stop it for a month and a half.  She will call us again 05/19/2018 and if she is feeling at least 20% better we will switch her to tamoxifen at that point.  If she is really not better we will simply resume anastrozole then.  She also  complains of insomnia.  We discussed sleep hygiene today and she will hopefully put those suggestions into action.  We will discuss that again when she returns to see me March 2020  The good news though is that I do not see any evidence of breast cancer.  She knows to call for any other issues that may develop before the next visit.   Magrinat, Virgie Dad, MD  04/09/18 4:01 PM Medical Oncology and Hematology [facility] [address] [place], [place] [address] Tel. [phone]    Fax. [phone]  Alice Rieger, am acting as scribe for Chauncey Cruel MD.  I, Lurline Del MD, have reviewed the above documentation for accuracy and completeness, and I agree with the above.

## 2018-04-09 ENCOUNTER — Telehealth: Payer: Self-pay | Admitting: Oncology

## 2018-04-09 ENCOUNTER — Inpatient Hospital Stay: Payer: Medicare Other

## 2018-04-09 ENCOUNTER — Inpatient Hospital Stay: Payer: Medicare Other | Attending: Oncology | Admitting: Oncology

## 2018-04-09 VITALS — BP 177/82 | HR 73 | Temp 97.8°F | Resp 18 | Ht 64.0 in | Wt 180.2 lb

## 2018-04-09 DIAGNOSIS — Z170 Estrogen receptor positive status [ER+]: Principal | ICD-10-CM

## 2018-04-09 DIAGNOSIS — E039 Hypothyroidism, unspecified: Secondary | ICD-10-CM | POA: Insufficient documentation

## 2018-04-09 DIAGNOSIS — Z79811 Long term (current) use of aromatase inhibitors: Secondary | ICD-10-CM

## 2018-04-09 DIAGNOSIS — Z79899 Other long term (current) drug therapy: Secondary | ICD-10-CM | POA: Insufficient documentation

## 2018-04-09 DIAGNOSIS — Z923 Personal history of irradiation: Secondary | ICD-10-CM | POA: Diagnosis not present

## 2018-04-09 DIAGNOSIS — C50211 Malignant neoplasm of upper-inner quadrant of right female breast: Secondary | ICD-10-CM

## 2018-04-09 DIAGNOSIS — M8588 Other specified disorders of bone density and structure, other site: Secondary | ICD-10-CM | POA: Diagnosis not present

## 2018-04-09 DIAGNOSIS — R5383 Other fatigue: Secondary | ICD-10-CM | POA: Diagnosis not present

## 2018-04-09 DIAGNOSIS — I427 Cardiomyopathy due to drug and external agent: Secondary | ICD-10-CM

## 2018-04-09 DIAGNOSIS — T451X5A Adverse effect of antineoplastic and immunosuppressive drugs, initial encounter: Secondary | ICD-10-CM

## 2018-04-09 LAB — CBC WITH DIFFERENTIAL/PLATELET
BASOS PCT: 0 %
Basophils Absolute: 0 10*3/uL (ref 0.0–0.1)
Eosinophils Absolute: 0.1 10*3/uL (ref 0.0–0.5)
Eosinophils Relative: 1 %
HEMATOCRIT: 41.8 % (ref 34.8–46.6)
HEMOGLOBIN: 14.2 g/dL (ref 11.6–15.9)
LYMPHS ABS: 1.4 10*3/uL (ref 0.9–3.3)
LYMPHS PCT: 24 %
MCH: 31.9 pg (ref 25.1–34.0)
MCHC: 34 g/dL (ref 31.5–36.0)
MCV: 93.9 fL (ref 79.5–101.0)
MONO ABS: 0.5 10*3/uL (ref 0.1–0.9)
MONOS PCT: 8 %
NEUTROS ABS: 3.8 10*3/uL (ref 1.5–6.5)
NEUTROS PCT: 67 %
Platelets: 168 10*3/uL (ref 145–400)
RBC: 4.45 MIL/uL (ref 3.70–5.45)
RDW: 14.1 % (ref 11.2–14.5)
WBC: 5.7 10*3/uL (ref 3.9–10.3)

## 2018-04-09 LAB — COMPREHENSIVE METABOLIC PANEL
ALBUMIN: 3.9 g/dL (ref 3.5–5.0)
ALK PHOS: 72 U/L (ref 38–126)
ALT: 28 U/L (ref 0–44)
ANION GAP: 8 (ref 5–15)
AST: 20 U/L (ref 15–41)
BUN: 14 mg/dL (ref 8–23)
CALCIUM: 10.1 mg/dL (ref 8.9–10.3)
CHLORIDE: 103 mmol/L (ref 98–111)
CO2: 31 mmol/L (ref 22–32)
Creatinine, Ser: 0.9 mg/dL (ref 0.44–1.00)
GFR calc non Af Amer: >60 mL/min (ref >60)
GLUCOSE: 98 mg/dL (ref 70–99)
POTASSIUM: 4.2 mmol/L (ref 3.5–5.1)
SODIUM: 142 mmol/L (ref 135–145)
Total Bilirubin: 0.4 mg/dL (ref 0.3–1.2)
Total Protein: 7.6 g/dL (ref 6.5–8.1)

## 2018-04-09 NOTE — Telephone Encounter (Signed)
Gave avs and calendar ° °

## 2018-04-14 ENCOUNTER — Other Ambulatory Visit: Payer: Self-pay | Admitting: *Deleted

## 2018-04-14 MED ORDER — KETOCONAZOLE 2 % EX CREA: 1.0000 "application " | TOPICAL_CREAM | Freq: Every day | CUTANEOUS | 0 refills | Status: DC

## 2018-04-15 DIAGNOSIS — H5203 Hypermetropia, bilateral: Secondary | ICD-10-CM | POA: Diagnosis not present

## 2018-04-15 DIAGNOSIS — D3131 Benign neoplasm of right choroid: Secondary | ICD-10-CM | POA: Diagnosis not present

## 2018-04-15 DIAGNOSIS — H524 Presbyopia: Secondary | ICD-10-CM | POA: Diagnosis not present

## 2018-04-15 DIAGNOSIS — H52223 Regular astigmatism, bilateral: Secondary | ICD-10-CM | POA: Diagnosis not present

## 2018-04-27 DIAGNOSIS — D485 Neoplasm of uncertain behavior of skin: Secondary | ICD-10-CM | POA: Diagnosis not present

## 2018-04-27 DIAGNOSIS — L304 Erythema intertrigo: Secondary | ICD-10-CM | POA: Diagnosis not present

## 2018-04-27 DIAGNOSIS — D225 Melanocytic nevi of trunk: Secondary | ICD-10-CM | POA: Diagnosis not present

## 2018-04-27 DIAGNOSIS — L821 Other seborrheic keratosis: Secondary | ICD-10-CM | POA: Diagnosis not present

## 2018-04-27 DIAGNOSIS — D224 Melanocytic nevi of scalp and neck: Secondary | ICD-10-CM | POA: Diagnosis not present

## 2018-04-27 DIAGNOSIS — L814 Other melanin hyperpigmentation: Secondary | ICD-10-CM | POA: Diagnosis not present

## 2018-05-01 DIAGNOSIS — M47816 Spondylosis without myelopathy or radiculopathy, lumbar region: Secondary | ICD-10-CM | POA: Diagnosis not present

## 2018-05-01 DIAGNOSIS — M5442 Lumbago with sciatica, left side: Secondary | ICD-10-CM | POA: Diagnosis not present

## 2018-05-01 DIAGNOSIS — M5136 Other intervertebral disc degeneration, lumbar region: Secondary | ICD-10-CM | POA: Diagnosis not present

## 2018-05-01 DIAGNOSIS — M48062 Spinal stenosis, lumbar region with neurogenic claudication: Secondary | ICD-10-CM | POA: Diagnosis not present

## 2018-05-02 ENCOUNTER — Other Ambulatory Visit: Payer: Self-pay | Admitting: Family Medicine

## 2018-05-02 DIAGNOSIS — L409 Psoriasis, unspecified: Secondary | ICD-10-CM

## 2018-05-04 DIAGNOSIS — M4726 Other spondylosis with radiculopathy, lumbar region: Secondary | ICD-10-CM | POA: Diagnosis not present

## 2018-05-04 DIAGNOSIS — M48061 Spinal stenosis, lumbar region without neurogenic claudication: Secondary | ICD-10-CM | POA: Diagnosis not present

## 2018-05-04 DIAGNOSIS — M5136 Other intervertebral disc degeneration, lumbar region: Secondary | ICD-10-CM | POA: Diagnosis not present

## 2018-05-04 NOTE — Telephone Encounter (Signed)
Is this okay to refill? 

## 2018-05-05 ENCOUNTER — Encounter: Payer: Self-pay | Admitting: Family Medicine

## 2018-05-18 ENCOUNTER — Other Ambulatory Visit: Payer: Self-pay | Admitting: Family Medicine

## 2018-05-18 DIAGNOSIS — E039 Hypothyroidism, unspecified: Secondary | ICD-10-CM

## 2018-05-19 ENCOUNTER — Encounter: Payer: Self-pay | Admitting: Family Medicine

## 2018-05-19 ENCOUNTER — Telehealth: Payer: Self-pay | Admitting: *Deleted

## 2018-05-19 ENCOUNTER — Ambulatory Visit (INDEPENDENT_AMBULATORY_CARE_PROVIDER_SITE_OTHER): Payer: Medicare Other | Admitting: Family Medicine

## 2018-05-19 VITALS — BP 148/100 | HR 72 | Temp 98.7°F | Ht 64.0 in | Wt 181.0 lb

## 2018-05-19 DIAGNOSIS — J0100 Acute maxillary sinusitis, unspecified: Secondary | ICD-10-CM

## 2018-05-19 DIAGNOSIS — N3001 Acute cystitis with hematuria: Secondary | ICD-10-CM

## 2018-05-19 DIAGNOSIS — R300 Dysuria: Secondary | ICD-10-CM

## 2018-05-19 LAB — POCT URINALYSIS DIP (PROADVANTAGE DEVICE)
BILIRUBIN UA: NEGATIVE mg/dL
Bilirubin, UA: NEGATIVE
GLUCOSE UA: NEGATIVE mg/dL
Nitrite, UA: NEGATIVE
Specific Gravity, Urine: 1.01
Urobilinogen, Ur: NEGATIVE
pH, UA: 6 (ref 5.0–8.0)

## 2018-05-19 MED ORDER — LEVOFLOXACIN 750 MG PO TABS: 750.0000 mg | ORAL_TABLET | Freq: Every day | ORAL | 0 refills | Status: DC

## 2018-05-19 MED ORDER — TAMOXIFEN CITRATE 20 MG PO TABS: 20.0000 mg | ORAL_TABLET | Freq: Every day | ORAL | 3 refills | Status: DC

## 2018-05-19 NOTE — Patient Instructions (Signed)
  Drink plenty of water. Continue  Mucinex, delsym as needed. Continue claritin. Try sinus rinses (sinus rinse kit or neti-pot) to help with sinus pressure/pain. Use tylenol as needed for pain, and heating pad for muscular pain from coughing.  Take the antibiotics twice daily as directed. Contact us in 3-5 days if worsening rather than improving. I suspect the urinary symptoms will improve faster. It may take 5-7 days for good relief from the upper respiratory symptoms.  You should see the color of the mucus getting lighter and thinner. Let us know if you develop fevers, persistent discolored mucus, shortness of breath.

## 2018-05-19 NOTE — Progress Notes (Signed)
Chief Complaint  Patient presents with  . Cough    and sinus pain and pressure. Headache and drainage. No fevers. Yellow mucus. Started this am with urinary burning and some blood in urine.    7-8 days ago she started with a head cold--sinus congestion, cheek pain.  She took mucinex, claritin (takes daily), and cough syrup.  Mucus was clear at first, turned yellow over the past weekend. She is coughing a lot but it is nonproductive. Denies shortness of breath, wheezing or tightness. Starting to have some discomfort in the chest from so much coughing.  No fever, chills, nausea, vomiting, diarrhea.  Stomach was slightly upset this morning. Woke up 4am with lower abdominal pain, hurt to void, noticed blood in the urine.  Since then, hasn't seen further blood, but has a lingering discomfort when she voids, and is having increased urgency.  Taking benadryl at night, doesn't cough much at night, coughing more during the day.  Recalls tessalon wasn't that effective in the past.  PMH, PSH. SH reviewed  Outpatient Encounter Medications as of 05/19/2018  Medication Sig  . Coenzyme Q10 (CO Q-10) 100 MG CAPS Take by mouth daily.  Marland Kitchen Dextromethorphan Polistirex (DELSYM PO) Take by mouth.  Marland Kitchen guaiFENesin (MUCINEX PO) Take 1 tablet by mouth every 4 (four) hours.  . hydrochlorothiazide (MICROZIDE) 12.5 MG capsule Take 1 capsule (12.5 mg total) by mouth daily.  Marland Kitchen ketoconazole (NIZORAL) 2 % cream Apply 1 application topically daily.  Marland Kitchen levothyroxine (SYNTHROID, LEVOTHROID) 75 MCG tablet TAKE 1 TABLET BY MOUTH EVERY DAY  . loratadine (CLARITIN) 10 MG tablet Take 10 mg by mouth daily as needed for allergies.   . Multiple Vitamins-Minerals (ALIVE WOMENS 50+ PO) Take 1 tablet by mouth daily.   . Omega-3 Fatty Acids (FISH OIL) 1200 MG CAPS Take 1,200 mg by mouth at bedtime.   Marland Kitchen omeprazole (PRILOSEC) 40 MG capsule TAKE 1 CAPSULE BY MOUTH EVERY DAY  . Red Yeast Rice Extract (RED YEAST RICE PO) Take 1,200 mg by mouth  2 (two) times daily.   . vitamin B-12 (CYANOCOBALAMIN) 1000 MCG tablet Take 1,000 mcg by mouth daily.  . [DISCONTINUED] gabapentin (NEURONTIN) 100 MG capsule Take 1 capsule (100 mg total) by mouth 2 (two) times daily.  . clobetasol cream (TEMOVATE) 0.05 % APPLY TOPICALLY TWO TIMES DAILY AS NEEDED FOR PSORIASIS. USE SPARINGLY. (Patient not taking: Reported on 05/19/2018)   No facility-administered encounter medications on file as of 05/19/2018.    Allergies  Allergen Reactions  . Adhesive [Tape] Other (See Comments)    SKIN TEARS  . Ciprofloxacin Nausea Only  . Codeine Nausea Only  . Iodine Swelling  . Polysporin [Bacitracin-Polymyxin B] Swelling    OPHTHALMIC - SWELLING OF EYES  . Penicillins Rash       . Sulfa Antibiotics Itching   ROS:  URI and urinary complaints per HPI. Denies vaginal bleeding, nausea, vomiting, diarrhea, flank pain, dizziness, chest pain, shortness of breath, bleeding (other than in urine), bruising, rash. Moods are good.  PHYSICAL EXAM:  BP (!) 148/100   Pulse 72   Temp 98.7 F (37.1 C) (Tympanic)   Ht 5' 4"  (1.626 m)   Wt 181 lb (82.1 kg)   BMI 31.07 kg/m   Well appearing, pleasant female, speaking easily, in full sentences, with frequent spells of dry, hacky cough.  HEENT: PERRL, EOMI, conjunctiva and sclera are clear. Nasal mucosa has no significant edema or purulence/drainage.  Area of recent bleed right septum noted. Tender to  palpation over both maxillary sinuses TM's and EAC clear, OP clear Neck: no lymphadenopathy or mass Heart: regular rate and rhythm Lungs: clear.  No wheezes, rales, ronchi. No extra coughing with deep breaths Back: no CVA tenderness Abdomen: tender over suprapubic area Extremities: no edema Skin: normal turgor, no rash  Urine dip: mod blood, 2+ leuks   ASSESSMENT/PLAN:  Acute cystitis with hematuria - Plan: Urine Culture, levofloxacin (LEVAQUIN) 750 MG tablet  Acute non-recurrent maxillary sinusitis - Plan:  levofloxacin (LEVAQUIN) 750 MG tablet  Burning with urination - Plan: POCT Urinalysis DIP (Proadvantage Device)  Chose levaquin as it would cover both urinary and sinus infections (has ABX allergies which limited other choices such as Septra). Has had nausea from cipro in past.  Risks/side effects of meds reviewed. Urine sent for culture. Contact us if not improving or if worsening. Declines cough meds (tessalon, hydrocodone)   Drink plenty of water. Continue  Mucinex, delsym as needed. Continue claritin. Try sinus rinses (sinus rinse kit or neti-pot) to help with sinus pressure/pain. Use tylenol as needed for pain, and heating pad for muscular pain from coughing.  Take the antibiotics twice daily as directed. Contact us in 3-5 days if worsening rather than improving. I suspect the urinary symptoms will improve faster. It may take 5-7 days for good relief from the upper respiratory symptoms.  You should see the color of the mucus getting lighter and thinner. Let us know if you develop fevers, persistent discolored mucus, shortness of breath.  DECLINES flu shot today, will get later this month

## 2018-05-19 NOTE — Telephone Encounter (Signed)
Per VM pt stated overall improvement in arthralgias since stopping the anastrozole - and she would like to proceed to tamoxifen as discussed at visit.  This RN attempted to return call and obtained identified VM- informing pt medication is being sent to her pharmacy as well as follow up visit will be made for December for medication evaluation.

## 2018-05-20 ENCOUNTER — Telehealth: Payer: Self-pay | Admitting: Oncology

## 2018-05-20 LAB — URINE CULTURE

## 2018-05-20 NOTE — Telephone Encounter (Signed)
Scheduled appt per 10/1 sch message - sent reminder letter in the mail.

## 2018-05-21 ENCOUNTER — Telehealth: Payer: Self-pay | Admitting: *Deleted

## 2018-05-21 MED ORDER — AZITHROMYCIN 250 MG PO TABS: ORAL_TABLET | ORAL | 0 refills | Status: DC

## 2018-05-21 NOTE — Telephone Encounter (Signed)
Message stated she didn't have UTI (was given levaquin because it would treat BOTH sinus and urinary infections).  If she is tolerating it without side effects, she can stay on it and take the full 5 days (only given 5 days worth of med).  I said if she was having trouble tolerating the medication, we could switch it to something that would just cover sinuses,since we now know we don't have to cover UTI.  I would give z-pak, as I think she has tolerated those in the past.  But there is no NEED to change, if she can manage to stay on the levaquin for the full 5 days. If she wants to change, confirm she is fine with zpak (hasn't taken in a year, per my records)

## 2018-05-21 NOTE — Telephone Encounter (Signed)
Patient called and said that you sent a MyChart message and stating that she did not have a UTI and that you would change ABX if she wanted-she would like that sent to [facility] please.

## 2018-05-21 NOTE — Telephone Encounter (Signed)
When I called her back she said she forgot to tell me that she is having diarrhea from the levaquin. She would prefer the zpak if possible.

## 2018-05-21 NOTE — Telephone Encounter (Signed)
Refill sent.

## 2018-05-21 NOTE — Telephone Encounter (Signed)
Patient advised.

## 2018-06-04 ENCOUNTER — Ambulatory Visit (INDEPENDENT_AMBULATORY_CARE_PROVIDER_SITE_OTHER): Payer: Medicare Other | Admitting: Family Medicine

## 2018-06-04 ENCOUNTER — Encounter: Payer: Self-pay | Admitting: Family Medicine

## 2018-06-04 VITALS — BP 122/86 | HR 84 | Temp 98.2°F | Ht 64.0 in | Wt 182.4 lb

## 2018-06-04 DIAGNOSIS — J0100 Acute maxillary sinusitis, unspecified: Secondary | ICD-10-CM | POA: Diagnosis not present

## 2018-06-04 DIAGNOSIS — Z23 Encounter for immunization: Secondary | ICD-10-CM | POA: Diagnosis not present

## 2018-06-04 DIAGNOSIS — R05 Cough: Secondary | ICD-10-CM

## 2018-06-04 DIAGNOSIS — R059 Cough, unspecified: Secondary | ICD-10-CM

## 2018-06-04 MED ORDER — AZITHROMYCIN 250 MG PO TABS: ORAL_TABLET | ORAL | 0 refills | Status: DC

## 2018-06-04 MED ORDER — METHYLPREDNISOLONE ACETATE 80 MG/ML IJ SUSP
80.0000 mg | Freq: Once | INTRAMUSCULAR | Status: AC
  Administered 2018-06-04: 80 mg via INTRAMUSCULAR

## 2018-06-04 NOTE — Progress Notes (Signed)
Chief Complaint  Patient presents with  . Cough    bad cough, still hasn't gotten any better. Mucus is not discolored. Sinus HA as well.   . Flu Vaccine    would like but not sure if it okay?    She was seen 10/1 with sinus pain, mucus had become discolored.  She was given ABX, told to try sinus rinses, continue claritin and mucinex. Changed from levaquin to z-pak on 10/3 (due to diarrhea, after determining there was no UTI).She continues to use the claritin daily and mucinex twice daily; never tried sinus rinses.  She switched from Delsym to Robitussin DM (along with Mucinex), without any benefit.  Denies benefit from tessalon in the past.  With the antibiotics she found that she blew out a lot of nasal mucus; this decreased and hasn't been blowing much out over the last week. She has persistent postnasal drainage, that is white (not yellow-green or clear).  She has persistent pain in her cheeks bilaterally, as well as at the back of her head.   She doesn't think her symptoms have gotten any worse in the last few days; cough has been persistent the whole time.  She denies fever.  She takes benadryl at night, so sleeping okay, but when she wakes up to go to the bathroom, starts coughing and is hard to get back to sleep.  No itchy eyes, sneezing, runny nose.   PMH, PSH, SH reviewed  Outpatient Encounter Medications as of 06/04/2018  Medication Sig Note  . clobetasol cream (TEMOVATE) 0.05 % APPLY TOPICALLY TWO TIMES DAILY AS NEEDED FOR PSORIASIS. USE SPARINGLY.   Marland Kitchen Coenzyme Q10 (CO Q-10) 100 MG CAPS Take by mouth daily.   Marland Kitchen dextromethorphan-guaiFENesin (MUCINEX DM) 30-600 MG 12hr tablet Take 1 tablet by mouth 2 (two) times daily.   Marland Kitchen guaiFENesin-dextromethorphan (ROBITUSSIN DM) 100-10 MG/5ML syrup Take 5 mLs by mouth every 4 (four) hours as needed for cough.   . hydrochlorothiazide (MICROZIDE) 12.5 MG capsule Take 1 capsule (12.5 mg total) by mouth daily.   Marland Kitchen ketoconazole (NIZORAL) 2 % cream  Apply 1 application topically daily.   Marland Kitchen levothyroxine (SYNTHROID, LEVOTHROID) 75 MCG tablet TAKE 1 TABLET BY MOUTH EVERY DAY   . loratadine (CLARITIN) 10 MG tablet Take 10 mg by mouth daily as needed for allergies.    . Multiple Vitamins-Minerals (ALIVE WOMENS 50+ PO) Take 1 tablet by mouth daily.    . Omega-3 Fatty Acids (FISH OIL) 1200 MG CAPS Take 1,200 mg by mouth at bedtime.    Marland Kitchen omeprazole (PRILOSEC) 40 MG capsule TAKE 1 CAPSULE BY MOUTH EVERY DAY   . Red Yeast Rice Extract (RED YEAST RICE PO) Take 1,200 mg by mouth 2 (two) times daily.    . tamoxifen (NOLVADEX) 20 MG tablet Take 1 tablet (20 mg total) by mouth daily. 05/19/2018: New one to replace anestrazole, hasn't started yet  . vitamin B-12 (CYANOCOBALAMIN) 1000 MCG tablet Take 1,000 mcg by mouth daily.   . [DISCONTINUED] guaiFENesin (MUCINEX PO) Take 1 tablet by mouth every 4 (four) hours.   . [DISCONTINUED] azithromycin (ZITHROMAX) 250 MG tablet Take 2 tablets by mouth on first day, then 1 tablet by mouth on days 2 through 5   . [DISCONTINUED] Dextromethorphan Polistirex (DELSYM PO) Take by mouth.    No facility-administered encounter medications on file as of 06/04/2018.    Allergies  Allergen Reactions  . Adhesive [Tape] Other (See Comments)    SKIN TEARS  . Ciprofloxacin Nausea Only  .  Codeine Nausea Only  . Iodine Swelling  . Polysporin [Bacitracin-Polymyxin B] Swelling    OPHTHALMIC - SWELLING OF EYES  . Penicillins Rash       . Sulfa Antibiotics Itching   ROS: no fever, chills, chest pain, shortness of breath: +posterior and sinus headaches, cough per HPI.  No nausea, vomiting, diarrhea, bleeding, bruising, rash, or other complaints.   PHYSICAL EXAM:  Temp 98.2 F (36.8 C) (Tympanic)   Ht 5' 4"  (1.626 m)   Wt 182 lb 6.4 oz (82.7 kg)   BMI 31.31 kg/m    Well appearing female, with frequent, dry hacky cough. Speaking easily, in no distress between coughing spells HEENT: PERRL, EOMI, conjunctiva and sclera  are clear, TM's and EAc's normal. Nasal mucosa is erythematous and mildly edematous R>L. Tender at bilateral maxillary sinuses, nontender elsewhere Neck: no lymphadenopathy or mass. She is tender along paraspinous muscles posteriorly Heart: regular rate and rhythm Lungs: clear bilaterally, no wheezes, rales, ronchi.  No coughing with deep breaths or forced expiration. Peak flows 340-350 Extremities: no edema Skin: normal turgor, no rash Neuro: alert and oriented, cranial nerves intact, normal gait.   ASSESSMENT/PLAN:  Acute non-recurrent maxillary sinusitis - partial response to z-pak, incomplete. discussed 2nd course vs change to doxy--repeat z-pak - Plan: azithromycin (ZITHROMAX) 250 MG tablet  Cough - risks/SE of steroids reviewed, suspect allergic component, cannot r/o RAD, both of which benefit from steroids - Plan: methylPREDNISolone acetate (DEPO-MEDROL) injection 80 mg  Need for influenza vaccination - Plan: Flu vaccine HIGH DOSE PF (Fluzone High dose)   Suspect some residual sinus infection, not completely treated with the zpak.   We discussed treating with 2nd Zpak (for a longer course) vs changing to a different antibiotic such as doxycycline.  We will give another z-pak. Contact us if you aren't improving to have this changed. (remember that the zpak stays in your system for 10 days, even though you only take it for 5).  We are giving you a shot of steroids today to also help. You can continue the mucinex and claritin.  Do NOT use Robitussin DM with the mucinex . You can use Delsym syrup, but you don't need to take this if it doesn't help. Continue with lozenges. I recommend trying Sinus Rinse Kit or Neti-pot since you still have sinus pressure--this will help alleviate the sinus pressure and clear that mucus before it runs down your throat and contributes to cough.  The other available cough medications include benzonatate that was not effective for you in the past.  And there  are syrups with hydrocodone--not sure if you'll have side effects from it like you did with codeine.  If the cough gets bad enough and you'd like to try it, let us know. This would be sedating and can only be used at bedtime (or with caution during the day, no driving).

## 2018-06-04 NOTE — Patient Instructions (Signed)
  Suspect some residual sinus infection, not completely treated with the zpak.   We discussed treating with 2nd Zpak (for a longer course) vs changing to a different antibiotic such as doxycycline.  We will give another z-pak. Contact us if you aren't improving to have this changed. (remember that the zpak stays in your system for 10 days, even though you only take it for 5).  We are giving you a shot of steroids today to also help. You can continue the mucinex and claritin.  Do NOT use Robitussin DM with the mucinex . You can use Delsym syrup, but you don't need to take this if it doesn't help. Continue with lozenges. I recommend trying Sinus Rinse Kit or Neti-pot since you still have sinus pressure--this will help alleviate the sinus pressure and clear that mucus before it runs down your throat and contributes to cough.  The other available cough medications include benzonatate that was not effective for you in the past.  And there are syrups with hydrocodone--not sure if you'll have side effects from it like you did with codeine.  If the cough gets bad enough and you'd like to try it, let us know. This would be sedating and can only be used at bedtime (or with caution during the day, no driving).

## 2018-06-10 ENCOUNTER — Other Ambulatory Visit: Payer: Self-pay | Admitting: Family Medicine

## 2018-07-09 DIAGNOSIS — M5136 Other intervertebral disc degeneration, lumbar region: Secondary | ICD-10-CM | POA: Diagnosis not present

## 2018-07-09 DIAGNOSIS — M4726 Other spondylosis with radiculopathy, lumbar region: Secondary | ICD-10-CM | POA: Diagnosis not present

## 2018-07-09 DIAGNOSIS — M48061 Spinal stenosis, lumbar region without neurogenic claudication: Secondary | ICD-10-CM | POA: Diagnosis not present

## 2018-07-29 NOTE — Progress Notes (Signed)
[facility]  [phone] [phone]     ID: Mckenzie Webb DOB: 1946-06-01  MR#: [id number]  EHM#:[id number]  Patient Care Team: Rita Ohara, MD as PCP - General (Family Medicine) Jovita Kussmaul, MD as Consulting Physician (General Surgery) , Virgie Dad, MD as Consulting Physician (Oncology) Eppie Gibson, MD as Attending Physician (Radiation Oncology) Juanita Craver, MD as Consulting Physician (Gastroenterology) Vania Rea, MD as Consulting Physician (Obstetrics and Gynecology) Elsie Saas, MD as Consulting Physician (Orthopedic Surgery) Martinique, Amy, MD as Consulting Physician (Dermatology) Delice Bison, Charlestine Massed, NP as Nurse Practitioner (Hematology and Oncology) OTHER MD:  CHIEF COMPLAINT: Estrogen receptor positive breast cancer  CURRENT TREATMENT: tamoxifen  BREAST CANCER HISTORY: From the original intake note:  "Mckenzie Webb" had bilateral screening mammography with tomography at the [facility] 02/21/2017. This showed a possible mass in the right breast. Right diagnostic mammography with tomography and ultrasonography 02/27/2017 found a breast density to be category B. In the subareolar right breast there was a 0.5 cm spiculated mass, which was not palpable. There was mild right nipple inversion, which per report was chronic. Ultrasonography confirmed a 0.5 cm retroareolar breast mass at the 1:00 radiant. The right axilla was sonographically benign.  On 03/03/2017 the patient underwent biopsy of the right breast mass in question. This showed (SAA [phone]) and invasive ductal carcinoma, grade 1, estrogen receptor 100% positive, progesterone receptor negative, with an MIB-1 of 3%, and no HER-2 implication, the signals ratio being 1.25 and the number per cell 2.00.  The patient's subsequent history is as detailed below.  INTERVAL HISTORY: Mckenzie Webb returns today for follow-up of her estrogen receptor positive breast cancer.   The patient  continues on tamoxifen, which she is tolerating well. Her fatigue has improved, but it has not resolved. She doers not nap during the day. She does not sleep well, and usually wakes up several times in the night to urinate. She has hot flashes with diaphoresis, mostly during he day. She is not experiencing vaginal wetness.    Her last bone density scan on 02/24/2018 showed a T-score of -0.6, which is considered normal.  Since her last visit here, she underwent a skin biopsy ([id number]) showing the spot on the right inferior latter upper cutaneous lip in question was a lentigo. There is hyperpigmentation with a slight increase in the number of melanocytes at the basal layer of the epidermis. There is no atypia.    REVIEW OF SYSTEMS: Mckenzie Webb is doing well overall. Has been having pain in the area around her bladder, and she states that it feels "almost like she has an infection." She has felt constant pain. She has received a pelvic exam from Dr. Stann Mainland and from her PCP, which yielded no results. The patient denies unusual headaches, visual changes, nausea, vomiting, or dizziness. There has been no unusual cough, phlegm production, or pleurisy. This been no change in bowel or bladder habits. The patient denies unexplained fatigue or unexplained weight loss, bleeding, rash, or fever. A detailed review of systems was otherwise noncontributory.    PAST MEDICAL HISTORY: Past Medical History:  Diagnosis Date  . Arthritis    lower back  . Cough 07/22/2017  . Dental crowns present   . Family history of adverse reaction to anesthesia    pt's sister has hx. of post-op N/V  . GERD (gastroesophageal reflux disease)   . History of chemotherapy    finished chemo 06/30/2017  . History of radiation therapy 07/29/17- 08/28/17   Right Breast  and Axilla treated to 42.56 Gy with 16 fx of 2.66. Boost of 8 Gy with 4 fx of 2 Gy.   Marland Kitchen History of right breast cancer 02/2017  . Hyperlipidemia   . Hypothyroid   .  Leukopenia 07/07/2017  . Personal history of chemotherapy   . Personal history of radiation therapy   . Stuffy and runny nose 07/22/2017   clear drainage from nose, per pt.    PAST SURGICAL HISTORY: Past Surgical History:  Procedure Laterality Date  . BREAST LUMPECTOMY Right 03/26/2017  . BREAST LUMPECTOMY WITH RADIOACTIVE SEED AND SENTINEL LYMPH NODE BIOPSY Right 03/26/2017   Procedure: BREAST LUMPECTOMY WITH RADIOACTIVE SEED AND SENTINEL LYMPH NODE BIOPSY;  Surgeon: Jovita Kussmaul, MD;  Location: [facility];  Service: General;  Laterality: Right;  . CHOLECYSTECTOMY    . COLONOSCOPY  3/09  . CYST EXCISION  02/01/2003   knee  . KNEE ARTHROSCOPY Right 1990  . KNEE ARTHROSCOPY Left 12/12/2010  . PORT-A-CATH REMOVAL N/A 09/03/2017   Procedure: REMOVAL PORT-A-CATH;  Surgeon: Jovita Kussmaul, MD;  Location: [facility];  Service: General;  Laterality: N/A;  . PORTACATH PLACEMENT Left 04/24/2017   Procedure: INSERTION PORT-A-CATH;  Surgeon: Jovita Kussmaul, MD;  Location: [facility];  Service: General;  Laterality: Left;  . TUBAL LIGATION  1983  . VARICOSE VEIN SURGERY  09/2009 R, 06/2010 L    FAMILY HISTORY Family History  Problem Relation Age of Onset  . Hypertension Mother   . Heart disease Mother   . Gallbladder disease Mother   . Thyroid disease Mother   . Arthritis Mother   . Diabetes Father   . Cancer Father        pancreatic  . Hypertension Sister   . Gallbladder disease Sister   . Diabetes Sister   . Diverticulitis Sister        of small intestine  . Cancer Son        testicular cancer  . Stroke Sister        late 56's  . Hypertension Sister   . Pulmonary embolism Sister   . Ulcerative colitis Sister   . COPD Brother        had lung lobe removed for suspected cancer, but was benign; smoker  . Colon cancer Other 51  . Lung cancer Cousin        maternal first cousin - smoker  The patient's father died from pancreatic cancer at the age of 22.  The patient's mother died from a myocardial infarction at age 3. The patient had one brother, 3 sisters. The patient's older son was diagnosed with testicular cancer at age 73. A maternal niece was diagnosed with colon cancer at age 53 and a maternal cousin with lung cancer at an unknown age.  GYNECOLOGIC HISTORY:  No LMP recorded. Patient is postmenopausal.  Menarche age 69, first live birth age 78, the patient is [place] P2. she went through the change of life approximately the year 2000. She did not take hormone replacement. She did take oral contraceptives for more than 20 years remotely, with no complications.  SOCIAL HISTORY:  She is a retired Web designer. Her husband Mckenzie Webb is an Clinical biochemist, still working part-time. Son Mckenzie Webb (from the patient's first marriage) lives in [place] and works in Therapist, art. Son Mckenzie Webb (from patient second marriage) lives in [place] we are is a Administrator, arts. The patient has one grandchild. She is not a Ambulance person.   ADVANCED  DIRECTIVES: Not in place   HEALTH MAINTENANCE: Social History   Tobacco Use  . Smoking status: Never Smoker  . Smokeless tobacco: Never Used  Substance Use Topics  . Alcohol use: Yes    Comment: "very little"  . Drug use: No     Colonoscopy: April 2019/ Dr. Mann/ normal  PAP:  Bone density: 12/27/2014 at the [facility], T score -0.1 (normal)   Allergies  Allergen Reactions  . Adhesive [Tape] Other (See Comments)    SKIN TEARS  . Ciprofloxacin Nausea Only  . Codeine Nausea Only  . Iodine Swelling  . Polysporin [Bacitracin-Polymyxin B] Swelling    OPHTHALMIC - SWELLING OF EYES  . Penicillins Rash       . Sulfa Antibiotics Itching    Current Outpatient Medications  Medication Sig Dispense Refill  . clobetasol cream (TEMOVATE) 0.05 % APPLY TOPICALLY TWO TIMES DAILY AS NEEDED FOR PSORIASIS. USE SPARINGLY. 60 g 0  . Coenzyme Q10 (CO Q-10) 100 MG CAPS Take by mouth daily.    . hydrochlorothiazide  (MICROZIDE) 12.5 MG capsule Take 1 capsule (12.5 mg total) by mouth daily. 90 capsule 3  . levothyroxine (SYNTHROID, LEVOTHROID) 75 MCG tablet TAKE 1 TABLET BY MOUTH EVERY DAY 90 tablet 1  . loratadine (CLARITIN) 10 MG tablet Take 10 mg by mouth daily as needed for allergies.     . Multiple Vitamins-Minerals (ALIVE WOMENS 50+ PO) Take 1 tablet by mouth daily.     . Omega-3 Fatty Acids (FISH OIL) 1200 MG CAPS Take 1,200 mg by mouth at bedtime.     Marland Kitchen omeprazole (PRILOSEC) 40 MG capsule TAKE 1 CAPSULE BY MOUTH EVERY DAY 90 capsule 0  . Red Yeast Rice Extract (RED YEAST RICE PO) Take 1,200 mg by mouth 2 (two) times daily.     . tamoxifen (NOLVADEX) 20 MG tablet Take 1 tablet (20 mg total) by mouth daily. 90 tablet 4  . venlafaxine XR (EFFEXOR-XR) 37.5 MG 24 hr capsule Take 1 capsule (37.5 mg total) by mouth daily with breakfast. 90 capsule 4  . vitamin B-12 (CYANOCOBALAMIN) 1000 MCG tablet Take 1,000 mcg by mouth daily.     No current facility-administered medications for this visit.     OBJECTIVE: Middle-aged white woman in no acute distress  Vitals:   07/30/18 1032  BP: (!) 143/86  Pulse: 90  Resp: 18  Temp: 98.3 F (36.8 C)  SpO2: 99%     Body mass index is 30.35 kg/m.   Wt Readings from Last 3 Encounters:  07/30/18 176 lb 12.8 oz (80.2 kg)  06/04/18 182 lb 6.4 oz (82.7 kg)  05/19/18 181 lb (82.1 kg)   ECOG FS:1 - Symptomatic but completely ambulatory   Sclerae unicteric, EOMs intact Oropharynx clear and moist No cervical or supraclavicular adenopathy Lungs no rales or rhonchi Heart regular rate and rhythm Abd soft, nontender, positive bowel sounds MSK no focal spinal tenderness, no upper extremity lymphedema Neuro: nonfocal, well oriented, appropriate affect Breasts: Breast is status post lumpectomy and radiation.  There is still minimal hyperpigmentation.  The left breast is benign.  Both axillae are benign.  LAB RESULTS:  CMP     Component Value Date/Time   NA 142  04/09/2018 1459   NA 142 08/01/2017 1056   K 4.2 04/09/2018 1459   K 4.4 08/01/2017 1056   CL 103 04/09/2018 1459   CO2 31 04/09/2018 1459   CO2 24 08/01/2017 1056   GLUCOSE 98 04/09/2018 1459   GLUCOSE  101 08/01/2017 1056   BUN 14 04/09/2018 1459   BUN 11.1 08/01/2017 1056   CREATININE 0.90 04/09/2018 1459   CREATININE 0.8 08/01/2017 1056   CALCIUM 10.1 04/09/2018 1459   CALCIUM 9.6 08/01/2017 1056   PROT 7.6 04/09/2018 1459   PROT 6.6 08/01/2017 1056   ALBUMIN 3.9 04/09/2018 1459   ALBUMIN 3.6 08/01/2017 1056   AST 20 04/09/2018 1459   AST 18 08/01/2017 1056   ALT 28 04/09/2018 1459   ALT 17 08/01/2017 1056   ALKPHOS 72 04/09/2018 1459   ALKPHOS 67 08/01/2017 1056   BILITOT 0.4 04/09/2018 1459   BILITOT 0.38 08/01/2017 1056   GFRNONAA >60 04/09/2018 1459   GFRAA >60 04/09/2018 1459    No results found for: TOTALPROTELP, ALBUMINELP, A1GS, A2GS, BETS, BETA2SER, GAMS, MSPIKE, SPEI  No results found for: Nils Pyle, [facility]  Lab Results  Component Value Date   WBC 5.7 04/09/2018   NEUTROABS 3.8 04/09/2018   HGB 14.2 04/09/2018   HCT 41.8 04/09/2018   MCV 93.9 04/09/2018   PLT 168 04/09/2018      Chemistry      Component Value Date/Time   NA 142 04/09/2018 1459   NA 142 08/01/2017 1056   K 4.2 04/09/2018 1459   K 4.4 08/01/2017 1056   CL 103 04/09/2018 1459   CO2 31 04/09/2018 1459   CO2 24 08/01/2017 1056   BUN 14 04/09/2018 1459   BUN 11.1 08/01/2017 1056   CREATININE 0.90 04/09/2018 1459   CREATININE 0.8 08/01/2017 1056      Component Value Date/Time   CALCIUM 10.1 04/09/2018 1459   CALCIUM 9.6 08/01/2017 1056   ALKPHOS 72 04/09/2018 1459   ALKPHOS 67 08/01/2017 1056   AST 20 04/09/2018 1459   AST 18 08/01/2017 1056   ALT 28 04/09/2018 1459   ALT 17 08/01/2017 1056   BILITOT 0.4 04/09/2018 1459   BILITOT 0.38 08/01/2017 1056       No results found for: LABCA2  No components found for: [id number]  No results for input(s):  INR in the last 168 hours.  Urinalysis    Component Value Date/Time   COLORURINE YELLOW 05/02/2017 2201   APPEARANCEUR HAZY (A) 05/02/2017 2201   LABSPEC 1.010 05/19/2018 1430   PHURINE 6.0 05/02/2017 2201   GLUCOSEU NEGATIVE 05/02/2017 2201   HGBUR SMALL (A) 05/02/2017 2201   BILIRUBINUR negative 05/19/2018 1430   BILIRUBINUR neg 08/17/2014 1123   KETONESUR negative 05/19/2018 1430   KETONESUR NEGATIVE 05/02/2017 2201   PROTEINUR trace (A) 05/19/2018 1430   PROTEINUR NEGATIVE 05/02/2017 2201   UROBILINOGEN negative 08/17/2014 1123   NITRITE Negative 05/19/2018 1430   NITRITE POSITIVE (A) 05/02/2017 2201   LEUKOCYTESUR Moderate (2+) (A) 05/19/2018 1430     STUDIES: Pathology report discussed with the patient  ELIGIBLE FOR AVAILABLE RESEARCH PROTOCOL: no  ASSESSMENT: 72 y.o. [place] woman status post right breast upper inner quadrant biopsy 03/04/2015 for a clinical T1a N0, stage IA invasive ductal carcinoma, grade 1, estrogen receptor positive, progesterone receptor negative, with an MIB-1 of 3% and no HER-2 amplification  (1) anastrozole started neoadjuvantly 03/12/2017, held at the start of chemotherapy, resumed late November 2018  (2) genetics testing 0 03/2017 through the  common hereditary cancer panel offered by [facility] found no deleterious mutations in APC, ATM, AXIN2, BARD1, BMPR1A, BRCA1, BRCA2, BRIP1, CDH1, CDKN2A (p14ARF), CDKN2A (p16INK4a), CHEK2, CTNNA1, DICER1, EPCAM (Deletion/duplication testing only), GREM1 (promoter region deletion/duplication testing only), KIT, MEN1, MLH1, MSH2, MSH3, MSH6,  MUTYH, NBN, NF1, NHTL1, PALB2, PDGFRA, PMS2, POLD1, POLE, PTEN, RAD50, RAD51C, RAD51D, SDHB, SDHC, SDHD, SMAD4, SMARCA4. STK11, TP53, TSC1, TSC2, and VHL.  The following genes were evaluated for sequence changes only: SDHA and HOXB13 c.251G>A variant only.    (3) right lumpectomy and sentinel lymph node biopsy 03/26/2017 showed a pT1c pN1, stage IB invasive ductal  carcinoma, grade 1, with negative margins  (4) Mammaprint returned high risk, indicating need for adjuvant chemotherapy  (5) chemotherapy consisting of Cytoxan and docetaxel given every 3 weeks 4 beginning on 04/28/2017  (a) changed to Doxorubicin and Cyclophosphamide beginning 05/19/2017, completing 3 cycles 06/30/2017  (b) dose reduced by 20% with the second and third CA treatments  (6) Adjuvant radiation completed 08/28/2017 Site/dose:   1) Right breast and axilla treated to 42.56 Gy with 16 fx of 2.66  2)  boost of 8 Gy with 4 fx of 2 Gy  (7) Anastrozole started in 06/2017  (a) DEXA scan 02/24/2018 showed a T score of -0.6 normal  (b) anastrozole stopped 04/09/2018--symptom trial  (c) tamoxifen started September 2019   PLAN: Baker Janus is now a little over a year out from definitive surgery for her breast cancer with no evidence of disease recurrence.  This is favorable.  She did not do well with anastrozole but is doing better with tamoxifen.  I do not think the fatigue that she is having is related to tamoxifen.  It is certainly not extreme fatigue.  In any rate I were going to check her thyroid today, as well as her magnesium and potassium and other basic labs.   I am also starting her on venlafaxine 37.5 mg daily.  I think this will help with hot flashes and if there is an element of depression involved perhaps it will be helpful in that regard as well.  I am not sure why she is having persistent discomfort in her lower pelvis.  This has been evaluated x2 already with cultures and exam.  I am going to obtain a pelvic CT just to make sure there is nothing else going on right now  Otherwise I have encouraged her to exercise more.  She will see Dr. Marlou Starks in March 2020.  She will see me again in September of next year.  She knows to call for any other issues that may develop before the next visit  I spent approximately 30 minutes face to face with Mattye with more than 50% of that time  spent in counseling and coordination of care.   , Virgie Dad, MD  07/30/18 10:51 AM Medical Oncology and Hematology [facility] [address] [place], [place] [address] Tel. [phone]    Fax. [phone]   I, Jacqualyn Posey am acting as a Education administrator for Chauncey Cruel, MD.   I, Lurline Del MD, have reviewed the above documentation for accuracy and completeness, and I agree with the above.

## 2018-07-30 ENCOUNTER — Inpatient Hospital Stay: Payer: Medicare Other | Attending: Oncology | Admitting: Oncology

## 2018-07-30 ENCOUNTER — Inpatient Hospital Stay: Payer: Medicare Other

## 2018-07-30 VITALS — BP 143/86 | HR 90 | Temp 98.3°F | Resp 18 | Ht 64.0 in | Wt 176.8 lb

## 2018-07-30 DIAGNOSIS — Z9221 Personal history of antineoplastic chemotherapy: Secondary | ICD-10-CM | POA: Diagnosis not present

## 2018-07-30 DIAGNOSIS — Z79810 Long term (current) use of selective estrogen receptor modulators (SERMs): Secondary | ICD-10-CM | POA: Diagnosis not present

## 2018-07-30 DIAGNOSIS — Z170 Estrogen receptor positive status [ER+]: Secondary | ICD-10-CM | POA: Insufficient documentation

## 2018-07-30 DIAGNOSIS — C50211 Malignant neoplasm of upper-inner quadrant of right female breast: Secondary | ICD-10-CM | POA: Diagnosis not present

## 2018-07-30 DIAGNOSIS — I427 Cardiomyopathy due to drug and external agent: Secondary | ICD-10-CM

## 2018-07-30 DIAGNOSIS — Z923 Personal history of irradiation: Secondary | ICD-10-CM | POA: Diagnosis not present

## 2018-07-30 DIAGNOSIS — R1030 Lower abdominal pain, unspecified: Secondary | ICD-10-CM

## 2018-07-30 DIAGNOSIS — T451X5A Adverse effect of antineoplastic and immunosuppressive drugs, initial encounter: Secondary | ICD-10-CM

## 2018-07-30 DIAGNOSIS — Z79899 Other long term (current) drug therapy: Secondary | ICD-10-CM | POA: Diagnosis not present

## 2018-07-30 LAB — MAGNESIUM: Magnesium: 1.9 mg/dL (ref 1.7–2.4)

## 2018-07-30 LAB — COMPREHENSIVE METABOLIC PANEL
ALT: 25 U/L (ref 0–44)
AST: 19 U/L (ref 15–41)
Albumin: 3.5 g/dL (ref 3.5–5.0)
Alkaline Phosphatase: 43 U/L (ref 38–126)
Anion gap: 9 (ref 5–15)
BUN: 13 mg/dL (ref 8–23)
CO2: 27 mmol/L (ref 22–32)
Calcium: 9.3 mg/dL (ref 8.9–10.3)
Chloride: 106 mmol/L (ref 98–111)
Creatinine, Ser: 0.8 mg/dL (ref 0.44–1.00)
GFR calc Af Amer: >60 mL/min (ref >60)
GFR calc non Af Amer: >60 mL/min (ref >60)
Glucose, Bld: 118 mg/dL — ABNORMAL HIGH (ref 70–99)
Potassium: 3.7 mmol/L (ref 3.5–5.1)
Sodium: 142 mmol/L (ref 135–145)
Total Bilirubin: 0.5 mg/dL (ref 0.3–1.2)
Total Protein: 6.9 g/dL (ref 6.5–8.1)

## 2018-07-30 LAB — CBC WITH DIFFERENTIAL/PLATELET
Abs Immature Granulocytes: 0.01 10*3/uL (ref 0.00–0.07)
BASOS ABS: 0 10*3/uL (ref 0.0–0.1)
Basophils Relative: 0 %
EOS PCT: 1 %
Eosinophils Absolute: 0 10*3/uL (ref 0.0–0.5)
HCT: 42.5 % (ref 36.0–46.0)
Hemoglobin: 14.2 g/dL (ref 12.0–15.0)
Immature Granulocytes: 0 %
Lymphocytes Relative: 25 %
Lymphs Abs: 1.4 10*3/uL (ref 0.7–4.0)
MCH: 32.9 pg (ref 26.0–34.0)
MCHC: 33.4 g/dL (ref 30.0–36.0)
MCV: 98.4 fL (ref 80.0–100.0)
Monocytes Absolute: 0.5 10*3/uL (ref 0.1–1.0)
Monocytes Relative: 8 %
NRBC: 0 % (ref 0.0–0.2)
Neutro Abs: 3.7 10*3/uL (ref 1.7–7.7)
Neutrophils Relative %: 66 %
PLATELETS: 136 10*3/uL — AB (ref 150–400)
RBC: 4.32 MIL/uL (ref 3.87–5.11)
RDW: 12.8 % (ref 11.5–15.5)
WBC: 5.5 10*3/uL (ref 4.0–10.5)

## 2018-07-30 MED ORDER — VENLAFAXINE HCL ER 37.5 MG PO CP24: 37.5000 mg | ORAL_CAPSULE | Freq: Every day | ORAL | 4 refills | Status: DC

## 2018-07-30 MED ORDER — TAMOXIFEN CITRATE 20 MG PO TABS: 20.0000 mg | ORAL_TABLET | Freq: Every day | ORAL | 4 refills | Status: DC

## 2018-07-31 LAB — THYROID PANEL WITH TSH
Free Thyroxine Index: 2.1 (ref 1.2–4.9)
T3 Uptake Ratio: 23 % — ABNORMAL LOW (ref 24–39)
T4, Total: 9.1 ug/dL (ref 4.5–12.0)
TSH: 3.09 u[IU]/mL (ref 0.450–4.500)

## 2018-08-05 ENCOUNTER — Telehealth: Payer: Self-pay | Admitting: *Deleted

## 2018-08-05 DIAGNOSIS — I10 Essential (primary) hypertension: Secondary | ICD-10-CM | POA: Diagnosis not present

## 2018-08-05 DIAGNOSIS — M47816 Spondylosis without myelopathy or radiculopathy, lumbar region: Secondary | ICD-10-CM | POA: Diagnosis not present

## 2018-08-05 DIAGNOSIS — Z6830 Body mass index (BMI) 30.0-30.9, adult: Secondary | ICD-10-CM | POA: Diagnosis not present

## 2018-08-05 DIAGNOSIS — M48062 Spinal stenosis, lumbar region with neurogenic claudication: Secondary | ICD-10-CM | POA: Diagnosis not present

## 2018-08-05 DIAGNOSIS — M5136 Other intervertebral disc degeneration, lumbar region: Secondary | ICD-10-CM | POA: Diagnosis not present

## 2018-08-05 DIAGNOSIS — M5442 Lumbago with sciatica, left side: Secondary | ICD-10-CM | POA: Diagnosis not present

## 2018-08-05 NOTE — Telephone Encounter (Signed)
Pt called stating has decided to "hold off" on the CT pelvis until after she sees urology. She will call when she is ready to have scan performed.

## 2018-08-06 ENCOUNTER — Ambulatory Visit (INDEPENDENT_AMBULATORY_CARE_PROVIDER_SITE_OTHER): Payer: Medicare Other | Admitting: Family Medicine

## 2018-08-06 ENCOUNTER — Encounter: Payer: Self-pay | Admitting: Family Medicine

## 2018-08-06 VITALS — BP 132/76 | HR 88 | Temp 98.5°F | Ht 64.0 in | Wt 177.8 lb

## 2018-08-06 DIAGNOSIS — N3010 Interstitial cystitis (chronic) without hematuria: Secondary | ICD-10-CM

## 2018-08-06 DIAGNOSIS — R102 Pelvic and perineal pain: Secondary | ICD-10-CM

## 2018-08-06 LAB — POCT URINALYSIS DIP (PROADVANTAGE DEVICE)
BILIRUBIN UA: NEGATIVE mg/dL
Bilirubin, UA: NEGATIVE
Blood, UA: NEGATIVE
Glucose, UA: NEGATIVE mg/dL
Leukocytes, UA: NEGATIVE
Nitrite, UA: NEGATIVE
Specific Gravity, Urine: 1.015
Urobilinogen, Ur: NEGATIVE
pH, UA: 6 (ref 5.0–8.0)

## 2018-08-06 NOTE — Patient Instructions (Signed)
We discussed IC, treatments and symptoms. The easier symptoms to treat are the urgency and frequency, not the discomfort, which is your main concern. If you change your mind, we can try oxybutynin to help with the urgency/frequency. The medications for pain take much longer to work, and honestly the bladder installation treatments work much faster.  Treatment could include gabapentin, but it might be best to just wait and see the urologist, rather than starting a new medication with side effects over the holidays.  Continue tylenol for pain relief (extra strength or tylenol arthritis--this one lasts longer).  You may use ibuprofen OR aleve along with tylenol, for pain relief, if needed.  If you have fever, blood in the urine, pain with urination, please contact us for a trial of an antibiotic.  Your urine today did not show any evidence of infection.      Interstitial Cystitis  Interstitial cystitis is inflammation of the bladder. This may cause pain in the bladder area as well as a frequent and urgent need to urinate. The bladder is a hollow organ in the lower part of the abdomen. It stores urine after the urine is made in the kidneys. The severity of interstitial cystitis can vary from person to person. You may have flare-ups, and then your symptoms may go away for a while. For many people, it becomes a long-term (chronic) problem. What are the causes? The cause of this condition is not known. What increases the risk? The following factors may make you more likely to develop this condition:  You are female.  You have fibromyalgia.  You have irritable bowel syndrome (IBS).  You have endometriosis. This condition may be aggravated by:  Stress.  Smoking.  Spicy foods. What are the signs or symptoms? Symptoms of interstitial cystitis vary, and they can change over time. Symptoms may include:  Discomfort or pain in the bladder area, which is in the lower abdomen. Pain can range  from mild to severe. The pain may change in intensity as the bladder fills with urine or as it empties.  Pain in the pelvic area, between the hip bones.  An urgent need to urinate.  Frequent urination.  Pain during urination.  Pain during sex.  Blood in the urine. For women, symptoms often get worse during menstruation. How is this diagnosed? This condition is diagnosed based on your symptoms, your medical history, and a physical exam. You may have tests to rule out other conditions, such as:  Urine tests.  Cystoscopy. For this test, a tool similar to a very thin telescope is used to look into your bladder.  Biopsy. This involves taking a sample of tissue from the bladder to be examined under a microscope. How is this treated? There is no cure for this condition, but treatment can help you control your symptoms. Work closely with your health care provider to find the most effective treatments for you. Treatment options may include:  Medicines to relieve pain and reduce how often you feel the need to urinate.  Learning ways to control when you urinate (bladder training).  Lifestyle changes, such as changing your diet or taking steps to control stress.  Using a device that provides electrical stimulation to your nerves, which can relieve pain (neuromodulation therapy). The device is placed on your back, where it blocks the nerves that cause you to feel pain in your bladder area.  A procedure that stretches your bladder by filling it with air or fluid.  Surgery. This is  rare. It is only done for extreme cases, if other treatments do not help. Follow these instructions at home: Bladder training   Use bladder training techniques as directed. Techniques may include: ? Urinating at scheduled times. ? Training yourself to delay urination. ? Doing exercises (Kegel exercises) to strengthen the muscles that control urine flow.  Keep a bladder diary. ? Write down the times that you  urinate and any symptoms that you have. This can help you find out which foods, liquids, or activities make your symptoms worse. ? Use your bladder diary to schedule bathroom trips. If you are away from home, plan to be near a bathroom at each of your scheduled times.  Make sure that you urinate just before you leave the house and just before you go to bed. Eating and drinking  Make dietary changes as recommended by your health care provider. You may need to avoid: ? Spicy foods. ? Foods that contain a lot of potassium.  Limit your intake of beverages that make you need to urinate. These include: ? Caffeinated beverages like soda, coffee, and tea. ? Alcohol. General instructions  Take over-the-counter and prescription medicines only as told by your health care provider.  Do not drink alcohol.  You can try a warm or cool compress over your bladder for comfort.  Avoid wearing tight clothing.  Do not use any products that contain nicotine or tobacco, such as cigarettes and e-cigarettes. If you need help quitting, ask your health care provider.  Keep all follow-up visits as told by your health care provider. This is important. Contact a health care provider if you have:  Symptoms that do not get better with treatment.  Pain or discomfort that gets worse.  More frequent urges to urinate.  A fever. Get help right away if:  You have no control over when you urinate. Summary  Interstitial cystitis is inflammation of the bladder.  This condition may cause pain in the bladder area as well as a frequent and urgent need to urinate.  You may have flare-ups of the condition, and then it may go away for a while. For many people, it becomes a long-term (chronic) problem.  There is no cure for interstitial cystitis, but treatment methods are available to control your symptoms. This information is not intended to replace advice given to you by your health care provider. Make sure you  discuss any questions you have with your health care provider. Document Released: 04/05/2004 Document Revised: 06/30/2017 Document Reviewed: 06/30/2017 Elsevier Interactive Patient Education  2019 [place] for Interstitial Cystitis Interstitial cystitis (IC) is a long-term (chronic) condition that causes pain and pressure in the bladder, the lower abdomen, and the pelvic area. Other symptoms of IC include urinary urgency and frequency. Symptoms tend to come and go. Many people with IC find that certain foods trigger their symptoms. Different foods may be problematic for different people. Some foods are more likely to cause symptoms than others. Learning which foods bother you and which do not can help you come up with an eating plan to manage IC. What are tips for following this plan? You may find it helpful to work with a dietitian. This health care provider can help you develop your eating plan by doing an elimination diet, which involves these steps:  Start with a list of foods that you think trigger your IC symptoms along with the foods that most commonly trigger symptoms for many people with IC.  Eliminate  those foods from your diet for about one month, then start reintroducing the foods one at a time to see which ones trigger your symptoms.  Make a list of the foods that trigger your symptoms. It may take several months to find out which foods bother you. Reading food labels Once you know which foods trigger your IC symptoms, you can avoid them. However, it is also a good idea to read food labels because some foods that trigger your symptoms may be included as ingredients in other foods. These ingredients may include:  Chili peppers.  Tomato products.  Soy.  Worcestershire sauce.  Vinegar.  Alcohol.  Citrus flavors or juices.  Artificial sweeteners.  Monosodium glutamate. Shopping  Shopping can be a challenge if many foods trigger your IC. When you go  grocery shopping, bring a list of the foods you can eat.  You can get an app for your phone that lets you know which foods are the safest and which you may want to avoid. You can find the app at the Interstitial Cystitis Network website: www.ic-network.com Meal planning  Plan your meals according to the results of your elimination diet. If you have not done an elimination diet, plan meals according to IC food lists recommended by your health care provider or dietitian. These lists tell you which foods are least and most likely to cause symptoms.  Avoid certain types of food when you go out to eat, such as pizza and foods typically served at [place], [place], and [place]. These foods often contain ingredients that can aggravate IC. General information Here are some general guidelines for an IC eating plan:  Do not eat large portions.  Drink plenty of fluids with your meals.  Do not eat foods that are high in sugar, salt, or saturated fat.  Choose whole fruits instead of juice.  Eat a colorful variety of vegetables. What foods should I eat? For people with IC, the best diet is a balanced one that includes things from all the food groups. Even if you have to avoid certain foods, there are still plenty of healthy choices in each group. The following are some foods that are least bothersome and may be safest to eat: Fruits Bananas. Blueberries and blueberry juice. Melons. Pears. Apples. Dates. Prunes. Raisins. Apricots. Vegetables Asparagus. Avocado. Celery. Beets. Bell peppers. Black olives. Broccoli. Brussels sprouts. Cabbage. Carrots. Cauliflower. Cucumber. Eggplant. Green beans. Potatoes. Radishes. Spinach. Squash. Turnips. Zucchini. Mushrooms. Peas. Grains Oats. Rice. Bran. Oatmeal. Whole wheat bread. Meats and other proteins Beef. Fish and other seafood. Eggs. Nuts. Peanut butter. Pork. Poultry. Lamb. Garbanzo beans. Pinto beans. Dairy Whole or low-fat milk. American,  mozzarella, mild cheddar, feta, ricotta, and cream cheeses. The items listed above may not be a complete list of foods and beverages you can eat. Contact a dietitian for more information. What foods should I avoid? You should avoid any foods that seem to trigger your symptoms. It is also a good idea to avoid foods that are most likely to cause symptoms in many people with IC. These include the following: Fruits Citrus fruits, including lemons, limes, oranges, and grapefruit. Cranberries. Strawberries. Pineapple. Kiwi. Vegetables Chili peppers. Onions. Sauerkraut. Tomato and tomato products. Angie Fava. Grains You do not need to avoid any type of grain unless it triggers your symptoms. Meats and other proteins Precooked or cured meats, such as sausages or meat loaves. Soy products. Dairy Chocolate ice cream. Processed cheese. Yogurt. Beverages Alcohol. Chocolate drinks. Coffee. Cranberry juice. Carbonated drinks. Tea (  black, green, or herbal). Tomato juice. Sports drinks. The items listed above may not be a complete list of foods and beverages you should avoid. Contact a dietitian for more information. Summary  Many people with IC find that certain foods trigger their symptoms. Different foods may be problematic for different people. Some foods are more likely to cause symptoms than others.  You may find it helpful to work with a dietitian to do an elimination diet and come up with an eating plan that is right for you.  Plan your meals according to the results of your elimination diet. If you have not done an elimination diet, plan your meals using IC food lists. These lists tell you which foods are least and most likely to cause symptoms.  The best diet for people with IC is a balanced diet that includes foods from all the food groups. Even if you have to avoid certain foods, there are still plenty of healthy choices in each group. This information is not intended to replace advice given to  you by your health care provider. Make sure you discuss any questions you have with your health care provider. Document Released: 04/09/2018 Document Revised: 04/09/2018 Document Reviewed: 04/09/2018 Elsevier Interactive Patient Education  2019 [employer].

## 2018-08-06 NOTE — Progress Notes (Signed)
Chief Complaint  Patient presents with  . Abdominal Pain    lower abd pain x several weeks. Thinks it's her IC but could not get in with uro until 09/09/18.     She is having lower abdominal, suprapubic pressure, which started 3-4 weeks ago.  It was mild, "annoying" at first, but now it is there all the time.  She called her urologist, but they can't see her until January. She previously has had bladder instillation treatment for IC, which was effective in the past.  It has been many years, but she feels like she may need it again. Apparently she hasn't been seen there in a few years, so she is being treated as a "new" patient, and cannot see an extender.  She has some urinary urgency, voiding small amounts frequently.  Denies any dysuria, hematuria, incontinence.  She states the suprapubic discomfort is more bothersome than the urgency and frequency.  Pain is 4-5/10.  Tylenol doesn't seem to help.  She was prescribed macrodantin to use after intercourse.  She has been taking it once daily x 5-6 days, can't tell any improvement (she did this on her own) in the discomfort or urgency/frequency.  Saw Dr. [name] last week, suggested CT scan, but she preferred to hold off, as she thinks it is her IC rather than another cause, and wants to see if treating that helps before looking with CT.  PMH, PSH, SH reviewed  Outpatient Encounter Medications as of 08/06/2018  Medication Sig Note  . Coenzyme Q10 (CO Q-10) 100 MG CAPS Take by mouth daily.   . hydrochlorothiazide (MICROZIDE) 12.5 MG capsule Take 1 capsule (12.5 mg total) by mouth daily.   Marland Kitchen levothyroxine (SYNTHROID, LEVOTHROID) 75 MCG tablet TAKE 1 TABLET BY MOUTH EVERY DAY   . loratadine (CLARITIN) 10 MG tablet Take 10 mg by mouth daily as needed for allergies.    . Multiple Vitamins-Minerals (ALIVE WOMENS 50+ PO) Take 1 tablet by mouth daily.    . nitrofurantoin, macrocrystal-monohydrate, (MACROBID) 100 MG capsule Take 100 mg by mouth 2 (two)  times daily. 08/06/2018: Takes after intercourse; currently taking once daily x 5-6 days  . Omega-3 Fatty Acids (FISH OIL) 1200 MG CAPS Take 1,200 mg by mouth at bedtime.    Marland Kitchen omeprazole (PRILOSEC) 40 MG capsule TAKE 1 CAPSULE BY MOUTH EVERY DAY   . Red Yeast Rice Extract (RED YEAST RICE PO) Take 1,200 mg by mouth 2 (two) times daily.    . tamoxifen (NOLVADEX) 20 MG tablet Take 1 tablet (20 mg total) by mouth daily.   Marland Kitchen venlafaxine XR (EFFEXOR-XR) 37.5 MG 24 hr capsule Take 1 capsule (37.5 mg total) by mouth daily with breakfast.   . vitamin B-12 (CYANOCOBALAMIN) 1000 MCG tablet Take 1,000 mcg by mouth daily.   . clobetasol cream (TEMOVATE) 0.05 % APPLY TOPICALLY TWO TIMES DAILY AS NEEDED FOR PSORIASIS. USE SPARINGLY. (Patient not taking: Reported on 08/06/2018)    No facility-administered encounter medications on file as of 08/06/2018.    Allergies  Allergen Reactions  . Adhesive [Tape] Other (See Comments)    SKIN TEARS  . Ciprofloxacin Nausea Only  . Codeine Nausea Only  . Iodine Swelling  . Polysporin [Bacitracin-Polymyxin B] Swelling    OPHTHALMIC - SWELLING OF EYES  . Penicillins Rash       . Sulfa Antibiotics Itching   ROS:  Denies vaginal discharge, odor, itch No fever, chills. Denies bowel changes, constipation, diarrhea. Denies nausea, vomiting. No chest pain, shortness of  breath, mood changes, dizziness, or other concerns.   PHYSICAL EXAM:  BP 132/76   Pulse 88   Temp 98.5 F (36.9 C) (Tympanic)   Ht 5' 4"  (1.626 m)   Wt 177 lb 12.8 oz (80.6 kg)   BMI 30.52 kg/m   Well appearing, pleasant female, in good spirits HEENT: conjunctiva and sclera are clear, EOMI Neck: no lymphadenopathy Heart: regular rate and rhythm Lungs: clear bilaterally Back: no spinal or CVA tenderness Abdomen: +Suprapubic tenderness. No rebound, guarding, no hepatosplenomegaly or mass Extremities: no edema Neuro: alert and oriented, cranial nerves intact Psych: normal mood, affect,  hygiene and grooming  Trace protein, otherwise normal    ASSESSMENT/PLAN:   Suprapubic pain - Plan: POCT Urinalysis DIP (Proadvantage Device)  Interstitial cystitis - no e/o acute infection (and on ABX; culture not done). Discussed treatments, many have SE and don't work quickly; wants pain tx, not frequency   We discussed IC, treatments and symptoms. The easier symptoms to treat are the urgency and frequency, not the discomfort, which is your main concern. If you change your mind, we can try oxybutynin to help with the urgency/frequency. The medications for pain take much longer to work, and honestly the bladder installation treatments work much faster.  Treatment could include gabapentin, but it might be best to just wait and see the urologist, rather than starting a new medication with side effects over the holidays.  Continue tylenol for pain relief (extra strength or tylenol arthritis--this one lasts longer).  You may use ibuprofen OR aleve along with tylenol, for pain relief, if needed.  If you have fever, blood in the urine, pain with urination, please contact us for a trial of an antibiotic.  Your urine today did not show any evidence of infection.

## 2018-09-01 NOTE — Patient Instructions (Addendum)
HEALTH MAINTENANCE RECOMMENDATIONS:  It is recommended that you get at least 30 minutes of aerobic exercise at least 5 days/week (for weight loss, you may need as much as 60-90 minutes). This can be any activity that gets your heart rate up. This can be divided in 10-15 minute intervals if needed, but try and build up your endurance at least once a week.  Weight bearing exercise is also recommended twice weekly.  Eat a healthy diet with lots of vegetables, fruits and fiber.  "Colorful" foods have a lot of vitamins (ie green vegetables, tomatoes, red peppers, etc).  Limit sweet tea, regular sodas and alcoholic beverages, all of which has a lot of calories and sugar.  Up to 1 alcoholic drink daily may be beneficial for women (unless trying to lose weight, watch sugars).  Drink a lot of water.  Calcium recommendations are 1200-1500 mg daily (1500 mg for postmenopausal women or women without ovaries), and vitamin D 1000 IU daily.  This should be obtained from diet and/or supplements (vitamins), and calcium should not be taken all at once, but in divided doses.  Monthly self breast exams and yearly mammograms for women over the age of 27 is recommended.  Sunscreen of at least SPF 30 should be used on all sun-exposed parts of the skin when outside between the hours of 10 am and 4 pm (not just when at beach or pool, but even with exercise, golf, tennis, and yard work!)  Use a sunscreen that says "broad spectrum" so it covers both UVA and UVB rays, and make sure to reapply every 1-2 hours.  Remember to change the batteries in your smoke detectors when changing your clock times in the spring and fall.  Carbon monoxide detectors are recommended for your home.  Use your seat belt every time you are in a car, and please drive safely and not be distracted with cell phones and texting while driving.   Mckenzie Webb , Thank you for taking time to come for your Medicare Wellness Visit. I appreciate your ongoing  commitment to your health goals. Please review the following plan we discussed and let me know if I can assist you in the future.    This is a list of the screening recommended for you and due dates:  Health Maintenance  Topic Date Due  . Mammogram  02/25/2020  . Tetanus Vaccine  12/30/2020  . Colon Cancer Screening  12/25/2027  . Flu Shot  Completed  . DEXA scan (bone density measurement)  Completed  .  Hepatitis C: One time screening is recommended by Center for Disease Control  (CDC) for  adults born from [birth year] through 1963.   Completed  . Pneumonia vaccines  Completed   Next mammogram is due in July 2020 (ignore date above, which is 2 years from your last; we recommend them yearly).  I recommend getting the new shingles vaccine (Shingrix). This should be covered by Medicare Part D, so you need to get from the pharmacy rather than our office.  It is a series of 2 injections, spaced 2 months apart.  Ask your husband about your sleeping, and whether or not you have long pauses (ie sleep apnea).  Given your unrefreshed sleep and tiredness during the day, I think a sleep study is a good idea. Please let me know if/when you're ready for the referral. Talk to Dr. [name] about whether the tamoxifen can be contributing and whether or not a short trial off would be okay.  You probably can stop your D supplement if your multvitamin contains 1000 IU--but wait until the spring (I like you taking the extra D over the winter when you aren't getting the sun exposure).  Okay to stop your B12 supplement.  If we see changes in your blood counts in the future, we may have you restart it.   Your blood pressure was above goal. Goal is <130-135/80-85. If it is consistently higher, please return for re-evaluation. Be sure to limit the sodium in your diet, and exercise daily.

## 2018-09-01 NOTE — Progress Notes (Signed)
Chief Complaint  Patient presents with  . Medicare Wellness    fasting AWV visit no pelvic, sees Dr. Stann Mainland. Had recent eye exam and has appt with urology next week and wanted to wait to give them UA. No new concerns.    Mckenzie Webb is a 73 y.o. female who presents for annual wellness visit and follow-up on chronic medical conditions.  She has the following concerns:  Seen last month with suprapubic discomfort, h/o IC.  She is scheduled to see urologist next week. Dr. [name] ordered pelvic CT to evaluate her discomfort, but she elected to hold off until she sees the urologist.  She reports she took nitrofurantoin daily for about a month (had it to use prn after intercourse), and pain is somewhat improved.  Stopped the antibiotic a week ago, and so far symptoms are not any worse. She has pain across the lower abdomen, but no dysuria, urgency, frequency or hematuria. Occasionally has some discomfort in her left flank.  Breast Cancer: She was diagnosed in 02/2017 with invasive ductal carcinoma, grade 1, estrogen receptor 100% positive. She underwent right lumpectomy and sentinel lymph node biopsy 03/26/2017,which showed a pT1c pN1, stage IB invasive ductal carcinoma, grade 1, with negative margins. Mammaprint returned high risk, so she underwent adjuvant chemotherapy.  She completed radiation with Dr. Isidore Moos. She started on Anastrozole in 06/2017. It was stopped in 03/2018 "symptom trial"--she had excessive fatigue, which improved upon stopping this medication. She still has fatigue (see below), but not as bad as when on the Anastrozole. She has been on tamoxifen since 04/2018.  She has hot flashes, and was started on Effexor 37.49m for these. She last saw Dr. [name]12/12/19.  It has improved some, but still having a lot of hot flashes. Hasn't contacted him to have the dose increased.  She wonders if fatigue could be related to the Tamoxifen. (she also asked about effexor causing, but Dr. MJana Hakimnoted  the fatigue complaint prior to her restarting this).  She was started on Effexor 752min October 2018--had a reaction after chemo, had pain, was crying, and she reports it was stopped once chemo finished.  (started by Dr. MaJana Hakim  As stated above, she was started back on Effexor 37.53m23mt her December visit, to help with hot flashes (as well as cover any element of depression, as she was reporting fatigue to him).  She is complaining of fatigue, feels unrefreshed in the morning, and could go to bed by 9pm she is so tired.  She stays busy, and reports she doesn't have much energy during the day. She is not aware of snoring or pauses in her breathing, her husband hasn't mentioned.  Hypertension:  BP's have been running 130-135/75-80 at home. She reports her monitor was verified as accurate in the past.  Her BP is always up when she sees Dr. MagJana Hakimhe was started on HCTZ 12.53mg853m 08/2017.  She was taken off herlosartan HCTwhen she got very sick with chemo, was dehydrated. BP's remained elevated, and she was started back on HCTZ only.  She had reported that her cough improved after the losartan HCT was stopped, so we avoided the ARB component. She eats bananas in her diet. She denies muscle cramps or side effects.  Denies headaches, dizziness, chest pain, shortness of breath or edema.  Hyperlipidemia:  She continues to take red yeast rice (2 BID), and 2 fish oil capsules daily.  Last check (last year, when thyroid over-replaced), noted for lower HDL  and higher LDL.  Due for recheck. Diet: infrequent red meat; 5-7 eggs/week (eats bacon, egg and toast daily for breakfast). Infrequent cheese.  Doesn't use creamy sauces/dressings, uses mustard and not mayo  Lab Results  Component Value Date   CHOL 250 (H) 09/01/2017   HDL 65 09/01/2017   LDLCALC 154 (H) 09/01/2017   TRIG 154 (H) 09/01/2017   CHOLHDL 3.8 09/01/2017    Hypothyroidism: Denies any changes in bowels, weight, moods. Compliant  with medications.  She is complaining of fatigue and decreased energy, which is why Dr. [name] recently checked her thyroid. Last year, thyroid was noted to be over-replaced on labs drawn by oncologist.  Her dose of thyroid medication was decreased to 23mg, and recheck TSH in 10/2017 was 1.250. She had thyroid rechecked by Dr. MJana Hakimlast month. Lab Results  Component Value Date   TSH 3.090 07/30/2018   Psoriasis:  Some flare on her lower abdomen currently, and clobetasol is helping.  Previously has had involvement of gluteal area, low back/hips, not currently.  She has been using an OTC cortisone cream, helps with itching. Also has clobetasol for prn use.  GERD--She has been on dailyPPI, changed from Nexium to omeprazole for insurance reasons in the past. She had recurrent symptoms when tried stopping the medication in the past.Shealsotried taking the OTC Nexium, but it wasn't effective.She has been doing well on omeprazole, with no missed doses, and no symptoms. Denies any dysphagia. We previously discussed risks/safety of long-term medications vs untreated reflux. Wakes up feeling like she chokes, is able to "hock up" phlegm and feels better.  Trochanteric bursitis: Hips haven't been bothering her much lately.  Last injections were 01/2017, bilateral.  She continues to have low back pain. She has been seeing Dr. NSherwood Gambler(no records received). Patient states she was told it was "mostly arthritis".  She has had 2 injections in her spine so far, and they are helpful (but not long-lasting); gets benefit for about 2-3 months.  She last saw him 1-2 weeks ago. Has pain with prolonged standing. No longer radiates into the back of her legs.  Pain is mostly at the base of her spine (and near sacrum), sometimes radiates to the left buttock/hip.  IFG: Tries to limit sweets, carbs. Only an occasional sweet tea. Lab Results  Component Value Date   HGBA1C 5.1 09/01/2017    Immunization  History  Administered Date(s) Administered  . Influenza Split 06/20/2011, 06/19/2012, 05/25/2014  . Influenza Whole 05/19/2000, 05/19/2001  . Influenza, High Dose Seasonal PF 05/23/2016, 06/04/2018  . Influenza,inj,Quad PF,6+ Mos 08/01/2017  . Influenza-Unspecified 05/19/2014, 05/24/2015  . Pneumococcal Conjugate-13 07/26/2013  . Pneumococcal Polysaccharide-23 06/16/2000, 07/27/2014  . Td 04/13/2001  . Tdap 12/31/2010  . Zoster 07/26/2013   Last Pap smear: UTD through GYN  Last mammogram:02/2018 Last colonoscopy: 12/2017 Dr. MCollene Mares told 120yr/u (if any)  Last DEXA: 02/2018, normal Dentist: every 6 months  Ophtho: yearly Exercise: less than usual due to weather; walks 30 minutes at least 3-4 days/week.  No weight-bearing exercise Vitamin D level was normal in 2012   Other doctors caring for patient include: Ophtho:Dr. CoJorja LoaYN: Dr. WeStann MainlandI: Dr. MaCollene Maresentist: Dr. BaAhmed Primarologist: Dr. OtKarsten Rortho: Dr. WaNoemi Chapelurgeon: Dr. ToMarlou Starkseurosurgeon: Dr. NuSherwood Gamblerncologist: Dr. MaJana Hakimadiation oncology: Dr. SqIsidore Mooserm: Dr. JoMartinique Depression, Fall and Functional Status screens were performed and unremarkable (other than leakage of urine).  Denies significant memory concerns. Mini-Cog score of 4/5 (missed one recall) Please see  questionnaires in epic.   End of Life Discussion: Patient does not havea living will and medical power of attorney. She was given forms last year but admits she didn't do them yet. Still has the papers.   Past Medical History:  Diagnosis Date  . Arthritis    lower back  . Cough 07/22/2017  . Dental crowns present   . Family history of adverse reaction to anesthesia    pt's sister has hx. of post-op N/V  . GERD (gastroesophageal reflux disease)   . History of chemotherapy    finished chemo 06/30/2017  . History of radiation therapy 07/29/17- 08/28/17   Right Breast and Axilla treated to 42.56 Gy with 16 fx of 2.66. Boost of 8 Gy  with 4 fx of 2 Gy.   Marland Kitchen History of right breast cancer 02/2017  . Hyperlipidemia   . Hypothyroid   . Leukopenia 07/07/2017  . Personal history of chemotherapy   . Personal history of radiation therapy   . Stuffy and runny nose 07/22/2017   clear drainage from nose, per pt.    Past Surgical History:  Procedure Laterality Date  . BREAST LUMPECTOMY Right 03/26/2017  . BREAST LUMPECTOMY WITH RADIOACTIVE SEED AND SENTINEL LYMPH NODE BIOPSY Right 03/26/2017   Procedure: BREAST LUMPECTOMY WITH RADIOACTIVE SEED AND SENTINEL LYMPH NODE BIOPSY;  Surgeon: Jovita Kussmaul, MD;  Location: [facility];  Service: General;  Laterality: Right;  . CHOLECYSTECTOMY    . COLONOSCOPY  3/09  . CYST EXCISION  02/01/2003   knee  . KNEE ARTHROSCOPY Right 1990  . KNEE ARTHROSCOPY Left 12/12/2010  . PORT-A-CATH REMOVAL N/A 09/03/2017   Procedure: REMOVAL PORT-A-CATH;  Surgeon: Jovita Kussmaul, MD;  Location: [facility];  Service: General;  Laterality: N/A;  . PORTACATH PLACEMENT Left 04/24/2017   Procedure: INSERTION PORT-A-CATH;  Surgeon: Jovita Kussmaul, MD;  Location: [facility];  Service: General;  Laterality: Left;  . TUBAL LIGATION  1983  . VARICOSE VEIN SURGERY  09/2009 R, 06/2010 L    Social History   Socioeconomic History  . Marital status: Married    Spouse name: Not on file  . Number of children: 2  . Years of education: Not on file  . Highest education level: Not on file  Occupational History  . Occupation: office work for [employer]    Employer: [employer]  Social Needs  . Financial resource strain: Not on file  . Food insecurity:    Worry: Not on file    Inability: Not on file  . Transportation needs:    Medical: Not on file    Non-medical: Not on file  Tobacco Use  . Smoking status: Never Smoker  . Smokeless tobacco: Never Used  Substance and Sexual Activity  . Alcohol use: Not Currently    Comment: none since her cancer diagnosis 2018   . Drug use: No  . Sexual activity: Yes    Partners: Male  Lifestyle  . Physical activity:    Days per week: Not on file    Minutes per session: Not on file  . Stress: Not on file  Relationships  . Social connections:    Talks on phone: Not on file    Gets together: Not on file    Attends religious service: Not on file    Active member of club or organization: Not on file    Attends meetings of clubs or organizations: Not on file    Relationship status:  Not on file  . Intimate partner violence:    Fear of current or ex partner: Not on file    Emotionally abused: Not on file    Physically abused: Not on file    Forced sexual activity: Not on file  Other Topics Concern  . Not on file  Social History Narrative   Lives with husband, no pets.  Children in [place], [place] and [place].  1 granddaughter (in [place]). Retired 05/2014.    Family History  Problem Relation Age of Onset  . Hypertension Mother   . Heart disease Mother   . Gallbladder disease Mother   . Thyroid disease Mother   . Arthritis Mother   . Diabetes Father   . Cancer Father        pancreatic  . Hypertension Sister   . Gallbladder disease Sister   . Diabetes Sister   . Diverticulitis Sister        of small intestine  . Cancer Son        testicular cancer  . Stroke Sister        late 34's  . Hypertension Sister   . Pulmonary embolism Sister   . Ulcerative colitis Sister   . COPD Brother        had lung lobe removed for suspected cancer, but was benign; smoker  . Colon cancer Other 95  . Lung cancer Cousin        maternal first cousin - smoker    Outpatient Encounter Medications as of 09/03/2018  Medication Sig Note  . clobetasol cream (TEMOVATE) 0.05 % APPLY TOPICALLY TWO TIMES DAILY AS NEEDED FOR PSORIASIS. USE SPARINGLY.   Marland Kitchen Coenzyme Q10 (CO Q-10) 100 MG CAPS Take by mouth daily.   . hydrochlorothiazide (MICROZIDE) 12.5 MG capsule Take 1 capsule (12.5 mg total) by mouth daily.   Marland Kitchen levothyroxine (SYNTHROID,  LEVOTHROID) 75 MCG tablet TAKE 1 TABLET BY MOUTH EVERY DAY   . loratadine (CLARITIN) 10 MG tablet Take 10 mg by mouth daily as needed for allergies.    . Multiple Vitamins-Minerals (ALIVE WOMENS 50+ PO) Take 1 tablet by mouth daily.    . Omega-3 Fatty Acids (FISH OIL) 1200 MG CAPS Take 1,200 mg by mouth at bedtime.  09/03/2018: Takes 2 at bedtime  . omeprazole (PRILOSEC) 40 MG capsule TAKE 1 CAPSULE BY MOUTH EVERY DAY   . Red Yeast Rice Extract (RED YEAST RICE PO) Take 1,200 mg by mouth 2 (two) times daily.    . tamoxifen (NOLVADEX) 20 MG tablet Take 1 tablet (20 mg total) by mouth daily.   Marland Kitchen venlafaxine XR (EFFEXOR-XR) 37.5 MG 24 hr capsule Take 1 capsule (37.5 mg total) by mouth daily with breakfast.   . vitamin B-12 (CYANOCOBALAMIN) 1000 MCG tablet Take 1,000 mcg by mouth daily.   . nitrofurantoin, macrocrystal-monohydrate, (MACROBID) 100 MG capsule Take 100 mg by mouth 2 (two) times daily. 09/03/2018: Took regularly once daily for a month, stopped a week ago (used to only use after intercourse).   No facility-administered encounter medications on file as of 09/03/2018.     Allergies  Allergen Reactions  . Adhesive [Tape] Other (See Comments)    SKIN TEARS  . Ciprofloxacin Nausea Only  . Codeine Nausea Only  . Iodine Swelling  . Polysporin [Bacitracin-Polymyxin B] Swelling    OPHTHALMIC - SWELLING OF EYES  . Penicillins Rash       . Sulfa Antibiotics Itching    ROS: The patient denies anorexia, fever, vision changes,  decreased hearing, ear pain, sore throat, breast concerns, chest pain, palpitations, dizziness, syncope, dyspnea on exertion, swelling, nausea, vomiting, melena, hematochezia, hematuria, incontinence, dysuria, vaginal bleeding, discharge, odor or itch, genital lesions, numbness, tingling, weakness, tremor, suspicious skin lesions, depression, anxiety, abnormal bleeding/bruising, or enlarged lymph nodes. No memory concerns. Has intermittent insomnia, relieved by Benadryl, as  needed hemorroids periodically flare (internal); not yet ready for treatment. Low back pain (low towards her tailbone, and sometimes towards the left hip), worse with prolonged standing. No longer radiating down the legs.  Denies numbness, tingling, weakness.  Morning nasal/sinus congestion, with some mucus in her throat in the morning. Suprapubic discomfort.  No dysuria, urgency/frequency. Up 1-2x/night to void. +fatigue per HPI +hot flashes, somewhat improved since starting effexor.   PHYSICAL EXAM:  BP (!) 150/80   Pulse 72   Ht 5' 4"  (1.626 m)   Wt 177 lb 6.4 oz (80.5 kg)   BMI 30.45 kg/m   Wt Readings from Last 3 Encounters:  09/03/18 177 lb 6.4 oz (80.5 kg)  08/06/18 177 lb 12.8 oz (80.6 kg)  07/30/18 176 lb 12.8 oz (80.2 kg)    General Appearance:  Alert, cooperative, no distress, appears stated age. Patient did not undress into gown   Head:  Normocephalic, without obvious abnormality, atraumatic   Eyes:  PERRL, conjunctiva/corneas clear, EOM's intact, fundi benign   Ears:  Normal TM's and external ear canals   Nose:  Nares normal, mucosais normall;no sinus tenderness   Throat:  Lips, mucosa, and tongue normal; teeth and gums normal   Neck:  Supple, no lymphadenopathy; thyroid: no enlargement/ tenderness/nodules; no carotid bruit or JVD   Back:  Spine nontender, no curvature, ROM normal, no CVA tenderness.   Lungs:  Clear to auscultation bilaterally without wheezes, rales or ronchi; respirations unlabored   Chest Wall:  No tenderness or deformity.   Heart:  Regular rate and rhythm, S1 and S2 normal, no murmur, rub or gallop   Breast Exam:  Deferred to GYN   Abdomen:  Soft, nondistended, normoactive bowel sounds, no masses, no hepatosplenomegaly. Tender suprapubically. No rebound or guarding.  Genitalia:  Deferred to GYN      Extremities:  No clubbing, cyanosis or edema.Mildly tender over bilateral trochanteric  bursae  Pulses:  2+ and symmetric all extremities   Skin:  Skin color, texture, turgor normal. She did not get into gown for full exam. Scattered small patches of erythema on abdomen at bilateral waist area--not raised  Lymph nodes:  Cervical, supraclavicular, and axillary nodes normal   Neurologic:  CNII-XII intact, normal strength, sensation and gait; reflexes 2+ and symmetric throughout    Psych: Normal mood, affect, hygiene and grooming   Recent labs by oncologist:   Chemistry      Component Value Date/Time   NA 142 07/30/2018 1109   NA 142 08/01/2017 1056   K 3.7 07/30/2018 1109   K 4.4 08/01/2017 1056   CL 106 07/30/2018 1109   CO2 27 07/30/2018 1109   CO2 24 08/01/2017 1056   BUN 13 07/30/2018 1109   BUN 11.1 08/01/2017 1056   CREATININE 0.80 07/30/2018 1109   CREATININE 0.8 08/01/2017 1056      Component Value Date/Time   CALCIUM 9.3 07/30/2018 1109   CALCIUM 9.6 08/01/2017 1056   ALKPHOS 43 07/30/2018 1109   ALKPHOS 67 08/01/2017 1056   AST 19 07/30/2018 1109   AST 18 08/01/2017 1056   ALT 25 07/30/2018 1109   ALT 17 08/01/2017 1056  BILITOT 0.5 07/30/2018 1109   BILITOT 0.38 08/01/2017 1056     (glucose was nonfasting, 118)  Lab Results  Component Value Date   TSH 3.090 07/30/2018   Lab Results  Component Value Date   WBC 5.5 07/30/2018   HGB 14.2 07/30/2018   HCT 42.5 07/30/2018   MCV 98.4 07/30/2018   PLT 136 (L) 07/30/2018   Mg 1.9   ASSESSMENT/PLAN:  Medicare annual wellness visit, subsequent  Hypothyroidism, unspecified type - Recent TSH level 3; no dose change rec. Consider recheck in 3-6 mos if persistent/worsening fatigue or other sx  Essential hypertension, benign - elevated in office, normal at home. Cont home monitoring and f/u if elevated, goals reviewed. Daily exercise, low sodium diet recommended  Pure hypercholesterolemia - high intake of egg yolks/bacon. Consider change to crestor if elevated  (vs adjusting diet); discussed diet, SE, quality of life issues (would be fewer pills) - Plan: Lipid panel  Gastroesophageal reflux disease, esophagitis presence not specified - requires daily PPI to avoid recurrent sx. Recent labs done to monitor for SE/absorption issues, and all normal. Cont - Plan: omeprazole (PRILOSEC) 40 MG capsule  Psoriasis - mild flare, responding to clobetasol  IFG (impaired fasting glucose) - Plan: Hemoglobin A1c, Glucose, random  Malignant neoplasm of upper-inner quadrant of right breast in female, estrogen receptor positive (HCC)  Fatigue, unspecified type - Ddx discussed. Sleep study rec (prefers to ask husband before we order); possibly related to meds (?)  Chronic low back pain, unspecified back pain laterality, unspecified whether sciatica present - under care of Dr. Sherwood Gambler, improved with cortisone injections. No further sciatica. Will get records   ROR Nudelman  Fasting glu, lipids, A1c (all drawn with labs) (other labs done by onc)  Discussed fatigue, recommendation for sleep study, and potential treatments for sleep apnea, if found. She declined [place] doing referral today--wants to discuss/ask her husband Abbe Amsterdam first. Discussed that study is recommended even if husband doesn't hear it.  Discussed monthly self breast exams and yearly mammograms; at least 30 minutes of aerobic activity at least 5 days/week and weight-bearing exercise 2x/week; proper sunscreen use reviewed; healthy diet, including goals of calcium and vitamin D intake and alcohol recommendations (less than or equal to 1 drink/day) reviewed; regular seatbelt use; changing batteries in smoke detectors. Immunization recommendations discussed--UTD; continue high dose flu shots yearly. Shingrix is recommended; risks/SE reviewed, to get from pharmacy.Colonoscopy recommendations reviewed, UTD  MOST form reviewed and updated. Full Code, Full care. Encouraged to fill out and return Living Will and  healthcare POA.  (she specifically mentioned not wanting a feeding tube longterm keeping her alive--discussed the importance of filling out Living Will.)   Chronic PPI use. Risks/benefits of treatment reviewed in detail. Continue current vitamins.  Ask your husband about your sleeping, and whether or not you have long pauses (ie sleep apnea).  Given your unrefreshed sleep and tiredness during the day, I think a sleep study is a good idea. Please let me know if/when you're ready for the referral. Talk to Dr. [name] about whether the tamoxifen can be contributing and whether or not a short trial off would be okay.  You probably can stop your D supplement if your multvitamin contains 1000 IU--but wait until the spring (I like you taking the extra D over the winter when you aren't getting the sun exposure).  Okay to stop your B12 supplement.  If we see changes in your blood counts in the future, we may have you restart it.  Your blood pressure was above goal. Goal is <130-135/80-85. If it is consistently higher, please return for re-evaluation. Be sure to limit the sodium in your diet, and exercise daily.      Medicare Attestation I have personally reviewed: The patient's medical and social history Their use of alcohol, tobacco or illicit drugs Their current medications and supplements The patient's functional ability including ADLs,fall risks, home safety risks, cognitive, and hearing and visual impairment Diet and physical activities Evidence for depression or mood disorders  The patient's weight, height and BMI have been recorded in the chart.  I have made referrals, counseling, and provided education to the patient based on review of the above and I have provided the patient with a written personalized care plan for preventive services.

## 2018-09-03 ENCOUNTER — Encounter: Payer: Self-pay | Admitting: Family Medicine

## 2018-09-03 ENCOUNTER — Ambulatory Visit (INDEPENDENT_AMBULATORY_CARE_PROVIDER_SITE_OTHER): Payer: Medicare Other | Admitting: Family Medicine

## 2018-09-03 VITALS — BP 150/80 | HR 72 | Ht 64.0 in | Wt 177.4 lb

## 2018-09-03 DIAGNOSIS — G8929 Other chronic pain: Secondary | ICD-10-CM | POA: Diagnosis not present

## 2018-09-03 DIAGNOSIS — M545 Low back pain: Secondary | ICD-10-CM | POA: Diagnosis not present

## 2018-09-03 DIAGNOSIS — E039 Hypothyroidism, unspecified: Secondary | ICD-10-CM | POA: Diagnosis not present

## 2018-09-03 DIAGNOSIS — Z170 Estrogen receptor positive status [ER+]: Secondary | ICD-10-CM

## 2018-09-03 DIAGNOSIS — E7800 Pure hypercholesterolemia, unspecified: Secondary | ICD-10-CM | POA: Diagnosis not present

## 2018-09-03 DIAGNOSIS — R5383 Other fatigue: Secondary | ICD-10-CM | POA: Diagnosis not present

## 2018-09-03 DIAGNOSIS — C50211 Malignant neoplasm of upper-inner quadrant of right female breast: Secondary | ICD-10-CM | POA: Diagnosis not present

## 2018-09-03 DIAGNOSIS — L409 Psoriasis, unspecified: Secondary | ICD-10-CM | POA: Diagnosis not present

## 2018-09-03 DIAGNOSIS — R7301 Impaired fasting glucose: Secondary | ICD-10-CM

## 2018-09-03 DIAGNOSIS — Z0000 Encounter for general adult medical examination without abnormal findings: Secondary | ICD-10-CM | POA: Diagnosis not present

## 2018-09-03 DIAGNOSIS — I10 Essential (primary) hypertension: Secondary | ICD-10-CM

## 2018-09-03 DIAGNOSIS — K219 Gastro-esophageal reflux disease without esophagitis: Secondary | ICD-10-CM

## 2018-09-03 MED ORDER — OMEPRAZOLE 40 MG PO CPDR: 40.0000 mg | DELAYED_RELEASE_CAPSULE | Freq: Every day | ORAL | 3 refills | Status: DC

## 2018-09-04 LAB — GLUCOSE, RANDOM: Glucose: 99 mg/dL (ref 65–99)

## 2018-09-04 LAB — LIPID PANEL
Chol/HDL Ratio: 3.6 ratio (ref 0.0–4.4)
Cholesterol, Total: 232 mg/dL — ABNORMAL HIGH (ref 100–199)
HDL: 65 mg/dL (ref >39)
LDL Calculated: 140 mg/dL — ABNORMAL HIGH (ref 0–99)
Triglycerides: 136 mg/dL (ref 0–149)
VLDL Cholesterol Cal: 27 mg/dL (ref 5–40)

## 2018-09-04 LAB — HEMOGLOBIN A1C
Est. average glucose Bld gHb Est-mCnc: 111 mg/dL
Hgb A1c MFr Bld: 5.5 % (ref 4.8–5.6)

## 2018-09-09 ENCOUNTER — Telehealth: Payer: Self-pay | Admitting: Family Medicine

## 2018-09-09 DIAGNOSIS — N3010 Interstitial cystitis (chronic) without hematuria: Secondary | ICD-10-CM | POA: Diagnosis not present

## 2018-09-09 NOTE — Telephone Encounter (Signed)
Requested records from [facility] received. Sending back for review.

## 2018-10-03 ENCOUNTER — Encounter: Payer: Self-pay | Admitting: Oncology

## 2018-10-03 ENCOUNTER — Other Ambulatory Visit: Payer: Self-pay | Admitting: Nurse Practitioner

## 2018-10-14 DIAGNOSIS — H02825 Cysts of left lower eyelid: Secondary | ICD-10-CM | POA: Diagnosis not present

## 2018-10-14 DIAGNOSIS — H2513 Age-related nuclear cataract, bilateral: Secondary | ICD-10-CM | POA: Diagnosis not present

## 2018-10-14 DIAGNOSIS — H40033 Anatomical narrow angle, bilateral: Secondary | ICD-10-CM | POA: Diagnosis not present

## 2018-10-14 DIAGNOSIS — H43813 Vitreous degeneration, bilateral: Secondary | ICD-10-CM | POA: Diagnosis not present

## 2018-10-20 DIAGNOSIS — Z170 Estrogen receptor positive status [ER+]: Secondary | ICD-10-CM | POA: Diagnosis not present

## 2018-10-20 DIAGNOSIS — C50111 Malignant neoplasm of central portion of right female breast: Secondary | ICD-10-CM | POA: Diagnosis not present

## 2018-10-21 ENCOUNTER — Ambulatory Visit (INDEPENDENT_AMBULATORY_CARE_PROVIDER_SITE_OTHER): Payer: Medicare Other | Admitting: Family Medicine

## 2018-10-21 ENCOUNTER — Encounter: Payer: Self-pay | Admitting: Family Medicine

## 2018-10-21 VITALS — BP 138/80 | HR 88 | Temp 98.5°F | Ht 64.0 in | Wt 174.2 lb

## 2018-10-21 DIAGNOSIS — M545 Low back pain, unspecified: Secondary | ICD-10-CM

## 2018-10-21 DIAGNOSIS — E7800 Pure hypercholesterolemia, unspecified: Secondary | ICD-10-CM

## 2018-10-21 DIAGNOSIS — I10 Essential (primary) hypertension: Secondary | ICD-10-CM | POA: Diagnosis not present

## 2018-10-21 DIAGNOSIS — H40033 Anatomical narrow angle, bilateral: Secondary | ICD-10-CM | POA: Diagnosis not present

## 2018-10-21 LAB — POCT URINALYSIS DIP (PROADVANTAGE DEVICE)
Bilirubin, UA: NEGATIVE
Blood, UA: NEGATIVE
Glucose, UA: NEGATIVE mg/dL
Ketones, POC UA: NEGATIVE mg/dL
Leukocytes, UA: NEGATIVE
Nitrite, UA: NEGATIVE
PH UA: 6.5 (ref 5.0–8.0)
Protein Ur, POC: NEGATIVE mg/dL
Specific Gravity, Urine: 1.015
UUROB: NEGATIVE

## 2018-10-21 MED ORDER — NAPROXEN 500 MG PO TABS: 500.0000 mg | ORAL_TABLET | Freq: Two times a day (BID) | ORAL | 0 refills | Status: DC

## 2018-10-21 NOTE — Patient Instructions (Signed)
Stay well hydrated. Continue to use heat, as well as stretches and massage as discussed (at least 3 times/day). You can try topical medications if needed (ie SalonPas with lidocaine, Biofreeze, or others). We are prescribing naproxen for you to take twice daily with food. Take it regularly until your pain has resolved (may only need it for a week, but giving you enough to take for 2 weeks if needed). Do not take any other anti-inflammatories while taking this prescription (advil/motrin/ibuprofen/aleve, Goody or BC).  You may take tylenol, if additional pain medication is needed.    Acute Back Pain, Adult Acute back pain is sudden and usually short-lived. It is often caused by an injury to the muscles and tissues in the back. The injury may result from:  A muscle or ligament getting overstretched or torn (strained). Ligaments are tissues that connect bones to each other. Lifting something improperly can cause a back strain.  Wear and tear (degeneration) of the spinal disks. Spinal disks are circular tissue that provides cushioning between the bones of the spine (vertebrae).  Twisting motions, such as while playing sports or doing yard work.  A hit to the back.  Arthritis. You may have a physical exam, lab tests, and imaging tests to find the cause of your pain. Acute back pain usually goes away with rest and home care. Follow these instructions at home: Managing pain, stiffness, and swelling  Take over-the-counter and prescription medicines only as told by your health care provider.  Your health care provider may recommend applying ice during the first 24-48 hours after your pain starts. To do this: ? Put ice in a plastic bag. ? Place a towel between your skin and the bag. ? Leave the ice on for 20 minutes, 2-3 times a day.  If directed, apply heat to the affected area as often as told by your health care provider. Use the heat source that your health care provider recommends, such as  a moist heat pack or a heating pad. ? Place a towel between your skin and the heat source. ? Leave the heat on for 20-30 minutes. ? Remove the heat if your skin turns bright red. This is especially important if you are unable to feel pain, heat, or cold. You have a greater risk of getting burned. Activity   Do not stay in bed. Staying in bed for more than 1-2 days can delay your recovery.  Sit up and stand up straight. Avoid leaning forward when you sit, or hunching over when you stand. ? If you work at a desk, sit close to it so you do not need to lean over. Keep your chin tucked in. Keep your neck drawn back, and keep your elbows bent at a right angle. Your arms should look like the letter "L." ? Sit high and close to the steering wheel when you drive. Add lower back (lumbar) support to your car seat, if needed.  Take short walks on even surfaces as soon as you are able. Try to increase the length of time you walk each day.  Do not sit, drive, or stand in one place for more than 30 minutes at a time. Sitting or standing for long periods of time can put stress on your back.  Do not drive or use heavy machinery while taking prescription pain medicine.  Use proper lifting techniques. When you bend and lift, use positions that put less stress on your back: ? [place] your knees. ? Keep the load close  to your body. ? Avoid twisting.  Exercise regularly as told by your health care provider. Exercising helps your back heal faster and helps prevent back injuries by keeping muscles strong and flexible.  Work with a physical therapist to make a safe exercise program, as recommended by your health care provider. Do any exercises as told by your physical therapist. Lifestyle  Maintain a healthy weight. Extra weight puts stress on your back and makes it difficult to have good posture.  Avoid activities or situations that make you feel anxious or stressed. Stress and anxiety increase muscle tension  and can make back pain worse. Learn ways to manage anxiety and stress, such as through exercise. General instructions  Sleep on a firm mattress in a comfortable position. Try lying on your side with your knees slightly bent. If you lie on your back, put a pillow under your knees.  Follow your treatment plan as told by your health care provider. This may include: ? Cognitive or behavioral therapy. ? Acupuncture or massage therapy. ? Meditation or yoga. Contact a health care provider if:  You have pain that is not relieved with rest or medicine.  You have increasing pain going down into your legs or buttocks.  Your pain does not improve after 2 weeks.  You have pain at night.  You lose weight without trying.  You have a fever or chills. Get help right away if:  You develop new bowel or bladder control problems.  You have unusual weakness or numbness in your arms or legs.  You develop nausea or vomiting.  You develop abdominal pain.  You feel faint. Summary  Acute back pain is sudden and usually short-lived.  Use proper lifting techniques. When you bend and lift, use positions that put less stress on your back.  Take over-the-counter and prescription medicines and apply heat or ice as directed by your health care provider. This information is not intended to replace advice given to you by your health care provider. Make sure you discuss any questions you have with your health care provider. Document Released: 08/05/2005 Document Revised: 03/12/2018 Document Reviewed: 03/19/2017 Elsevier Interactive Patient Education  2019 [place] if you aren't getting better, if you feel a muscle relaxant is needed.  Let us know if pain is more daytime or nighttime, so we know to give you a medication that either makes you sleepy or doesn't.

## 2018-10-21 NOTE — Progress Notes (Signed)
Chief Complaint  Patient presents with  . Back Pain    both sides, worse on left side. When she sits for a long time it is worse. Pain started a few weeks ago.    Patient presents with complaint of pain in L>R back x several weeks.  She was concerned about her kidneys, given the location of pain, but not having dysuria, hematuria, urgency or other urinary complaints (see below--they resolved).   She cut some shrubs prior to onset of pain, otherwise denies any changes in activity, injury or other known cause of discomfort.  Pain is worse when she first get up in the morning, feels stiff, needs to massage it, which helps.  Hot water helps, as well as heating pad.  Tylenol eases the pain some. Denies radiation of pain, no numbness or tingling.  BP's at home 113-154/71-84, frequent 133 to low 140's. She has been checking regularly since her last visit. Her cholesterol was also above goal on her OTC supplements.  She reports she has had no egg or bacon since her physical. Wants to schedule f/u lipids  IC--she saw the urologist, who recommended restarting the bladder installation treatments.  She reports that they no longer carry the med, has to pick up from pharmacy, was over $800,so she cancelled her appts. He prescribed another month of nitrofurantoin, and her pain currently is gone. (took two 14 day courses).  PMH, PSH, SH reviewed  Outpatient Encounter Medications as of 10/21/2018  Medication Sig Note  . Coenzyme Q10 (CO Q-10) 100 MG CAPS Take by mouth daily.   Marland Kitchen erythromycin ophthalmic ointment APPLY A SMALL AMOUNT ONTO LEFT LOWER EYELID 3 TIMES A DAY FOR 1 WEEK.UNTIL SCAB FALLS OFF   . hydrochlorothiazide (MICROZIDE) 12.5 MG capsule Take 1 capsule (12.5 mg total) by mouth daily.   Marland Kitchen levothyroxine (SYNTHROID, LEVOTHROID) 75 MCG tablet TAKE 1 TABLET BY MOUTH EVERY DAY   . loratadine (CLARITIN) 10 MG tablet Take 10 mg by mouth daily as needed for allergies.    . Multiple Vitamins-Minerals  (ALIVE WOMENS 50+ PO) Take 1 tablet by mouth daily.    . Omega-3 Fatty Acids (FISH OIL) 1200 MG CAPS Take 1,200 mg by mouth at bedtime.  09/03/2018: Takes 2 at bedtime  . omeprazole (PRILOSEC) 40 MG capsule Take 1 capsule (40 mg total) by mouth daily.   . Red Yeast Rice Extract (RED YEAST RICE PO) Take 1,200 mg by mouth 2 (two) times daily.    . tamoxifen (NOLVADEX) 20 MG tablet Take 1 tablet (20 mg total) by mouth daily.   Marland Kitchen venlafaxine XR (EFFEXOR-XR) 37.5 MG 24 hr capsule Take 1 capsule (37.5 mg total) by mouth daily with breakfast.   . vitamin B-12 (CYANOCOBALAMIN) 1000 MCG tablet Take 1,000 mcg by mouth daily.   . clobetasol cream (TEMOVATE) 0.05 % APPLY TOPICALLY TWO TIMES DAILY AS NEEDED FOR PSORIASIS. USE SPARINGLY. (Patient not taking: Reported on 10/21/2018)   . nitrofurantoin, macrocrystal-monohydrate, (MACROBID) 100 MG capsule Take 100 mg by mouth 2 (two) times daily. 09/03/2018: Took regularly once daily for a month, stopped a week ago (used to only use after intercourse).   No facility-administered encounter medications on file as of 10/21/2018.    Allergies  Allergen Reactions  . Adhesive [Tape] Other (See Comments)    SKIN TEARS  . Ciprofloxacin Nausea Only  . Codeine Nausea Only  . Iodine Swelling  . Polysporin [Bacitracin-Polymyxin B] Swelling    OPHTHALMIC - SWELLING OF EYES  . Penicillins  Rash       . Sulfa Antibiotics Itching    ROS:  No fever, chills, URI symptoms, cough, shortness of breath, chest pain, palpitations, nausea, vomiting, diarrhea, vaginal discharge or itching. No urinary complaints.  No bleeding, bruising, rashes.    PHYSICAL EXAM:  BP 138/80   Pulse 88   Temp 98.5 F (36.9 C) (Tympanic)   Ht 5' 4"  (1.626 m)   Wt 174 lb 3.2 oz (79 kg)   BMI 29.90 kg/m   Well-appearing, pleasant female, in mild discomfort with movement. HEENT: conjunctiva and sclera are clear, EOMI Neck: no lymphadenopathy or mass Back: no spinal tenderness, SI  tenderness. Tender at paraspinous muscles with some palpable spasm on the L noted in the upper lumbar region. Neuro: alert and oriented. Normal strength, sensation. DTR's 2+ and symmetric Negative SLR. Normal gait. Skin: no rashes, bruising or lesions Psych: normal mood, affect, hygiene and grooming  Urine dip normal   ASSESSMENT/PLAN:  Low back pain, unspecified back pain laterality, unspecified chronicity, unspecified whether sciatica present - pain is muscular. Cont heat, shown stretches, rec massage. Trial of NSAIDs (risks/SE reviewed), consider adding muscle relaxant if not improving - Plan: POCT Urinalysis DIP (Proadvantage Device), naproxen (NAPROSYN) 500 MG tablet  Pure hypercholesterolemia - cont low cholesterol diet.  schedule f/u lipids in 3 months - Plan: Lipid panel  Essential hypertension, benign - okay, borderline elevated, but also has been in pain. Cont current meds and monitoring regularly. Contact us if persistently elevated   Patient is having an eye laser surgery to the right eye today, and will also have same procedure done on the left. Advised not to take the naproxen prior to the procedure, and to discuss with the eye doctor when she can start it (related to today's procedure, and also related to the scheduling of the one in the other eye).   Lab visit end of May, early June   Stay well hydrated. Continue to use heat, as well as stretches and massage as discussed (at least 3 times/day). You can try topical medications if needed (ie SalonPas with lidocaine, Biofreeze, or others). We are prescribing naproxen for you to take twice daily with food. Take it regularly until your pain has resolved (may only need it for a week, but giving you enough to take for 2 weeks if needed). Do not take any other anti-inflammatories while taking this prescription (advil/motrin/ibuprofen/aleve, Goody or BC).  You may take tylenol, if additional pain medication is needed.  Contact  us if you aren't getting better, if you feel a muscle relaxant is needed.  Let us know if pain is more daytime or nighttime, so we know to give you a medication that either makes you sleepy or doesn't.

## 2018-10-22 ENCOUNTER — Other Ambulatory Visit: Payer: Self-pay | Admitting: Family Medicine

## 2018-10-22 DIAGNOSIS — I10 Essential (primary) hypertension: Secondary | ICD-10-CM

## 2018-11-08 IMAGING — CT CT HEAD W/O CM
3 of 4 series · 15 of 47 positions shown, 18 images · non-contrast
Comparison: None.

CLINICAL DATA: Acute headache. Normal neurologic exam. Recently
diagnosed with breast cancer.

EXAM:
CT HEAD WITHOUT CONTRAST
TECHNIQUE: Contiguous axial images were obtained from the base of the skull
through the vertex without intravenous contrast.

[Series 2: head w/o · axial · non-contrast · 0.45mm/px · z∈[-155,-35]mm · 9 of 28 slices shown, 12 images]
[im 2/28  brain]
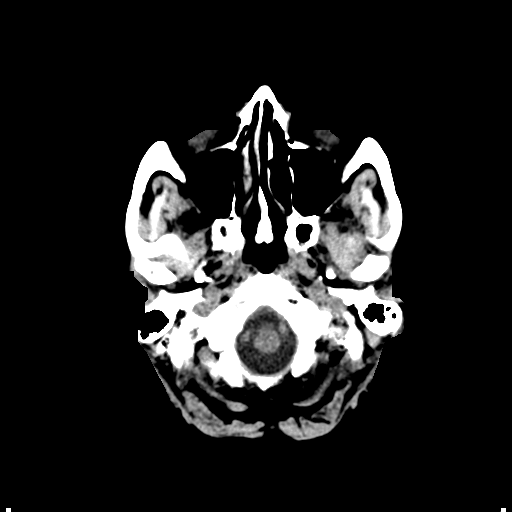
[im 2/28  bone]
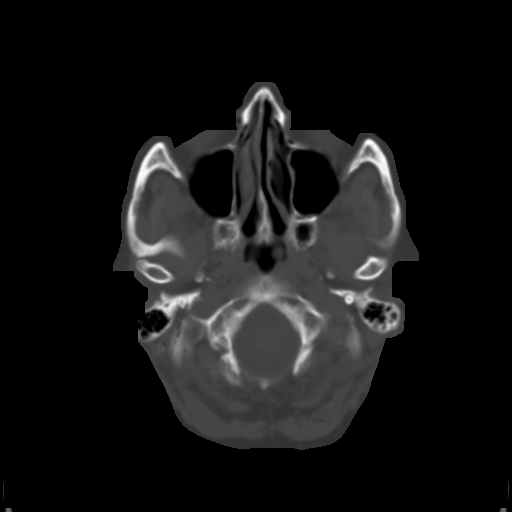
[im 6/28  brain]
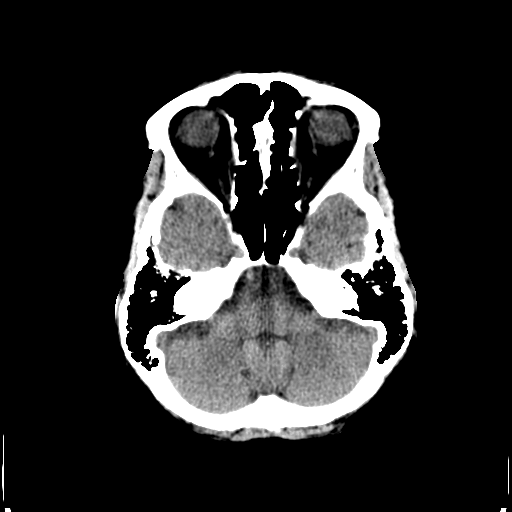
[im 8/28  brain]
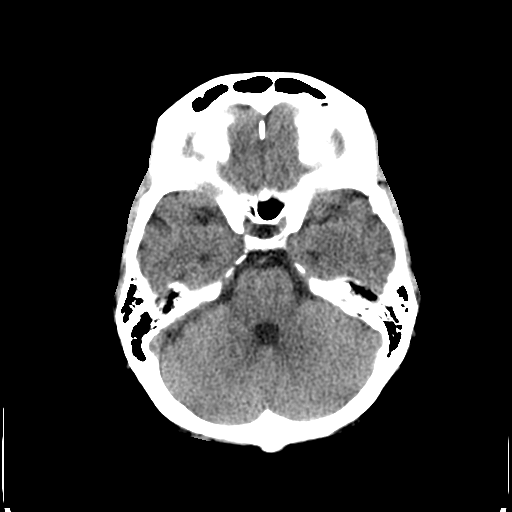
[im 12/28  brain]
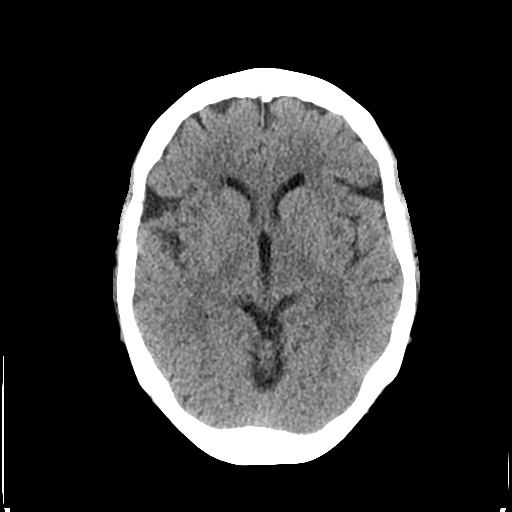
[im 14/28  brain]
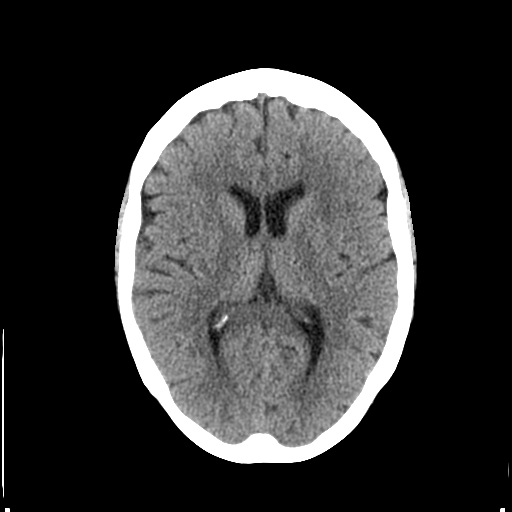
[im 14/28  bone]
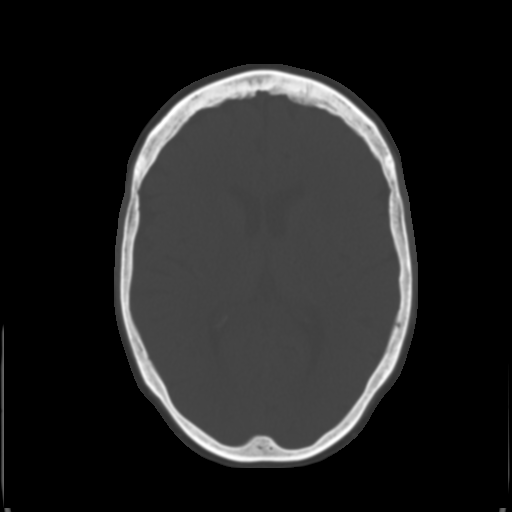
[im 16/28  brain]
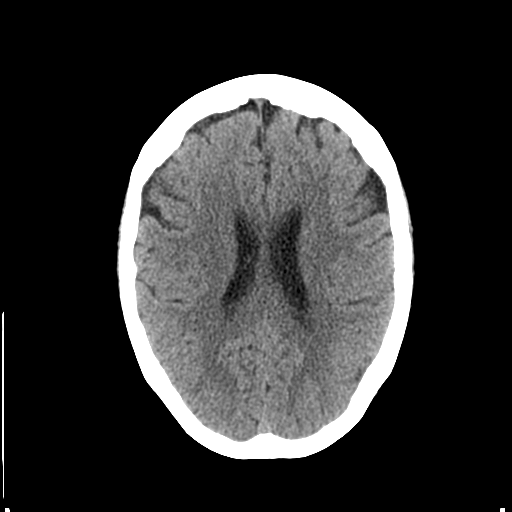
[im 20/28  brain]
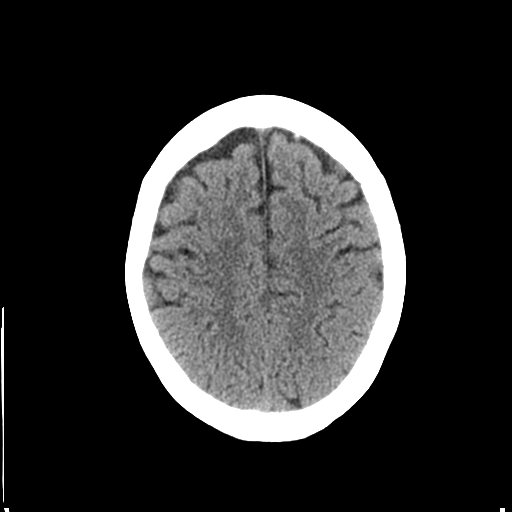
[im 22/28  brain]
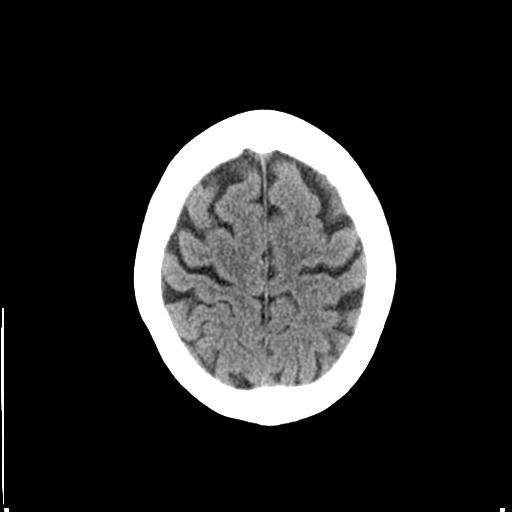
[im 26/28  brain]
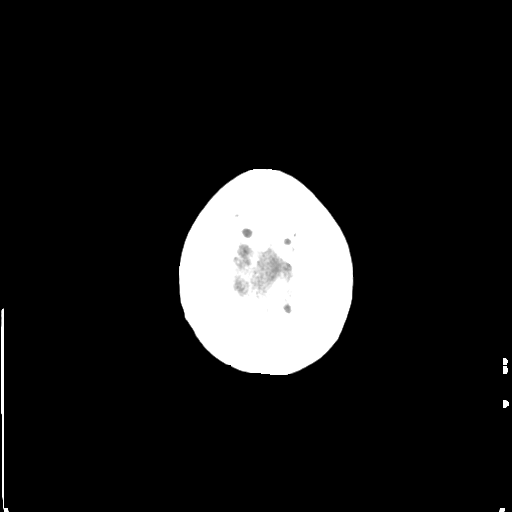
[im 26/28  bone]
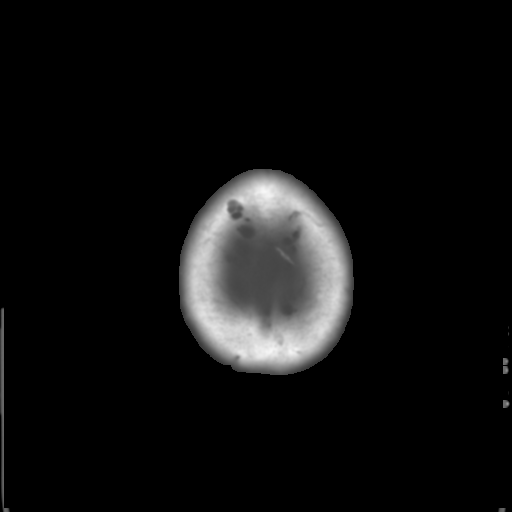

[Series 5: coronal · coronal · 0.29mm/px · 3 of 59 slices shown]
[im 20/59  brain]
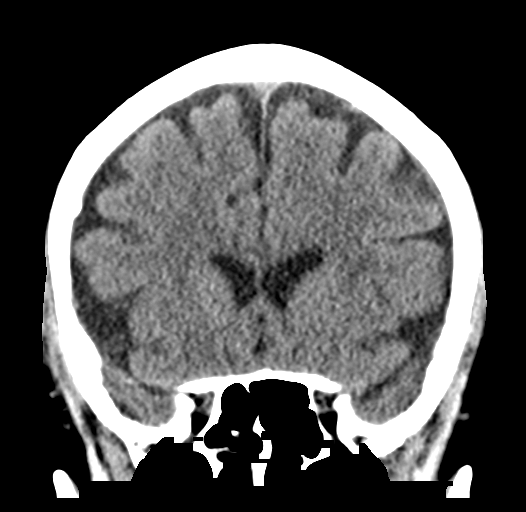
[im 26/59  brain]
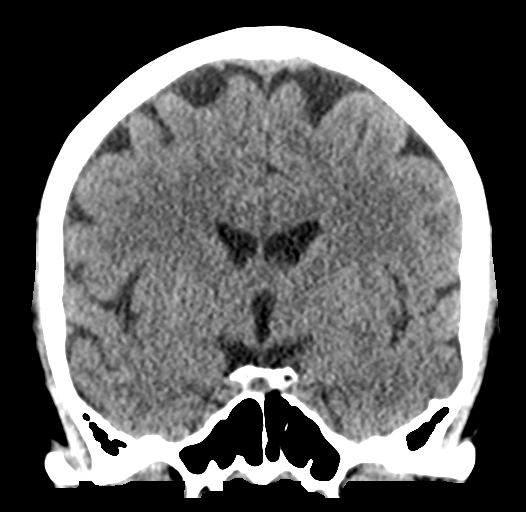
[im 33/59  brain]
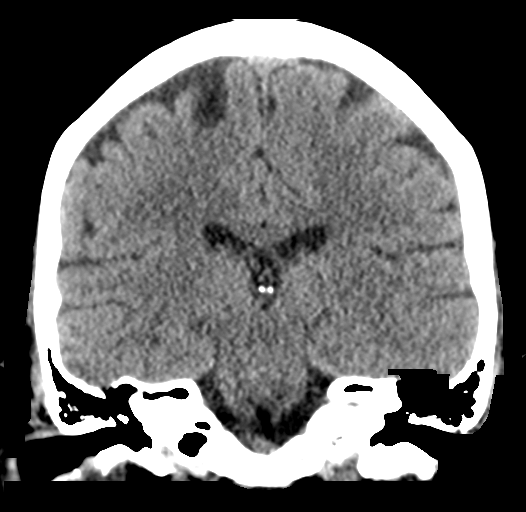

[Series 6: sagittal · sagittal · 0.28mm/px · 3 of 45 slices shown]
[im 15/45  brain]
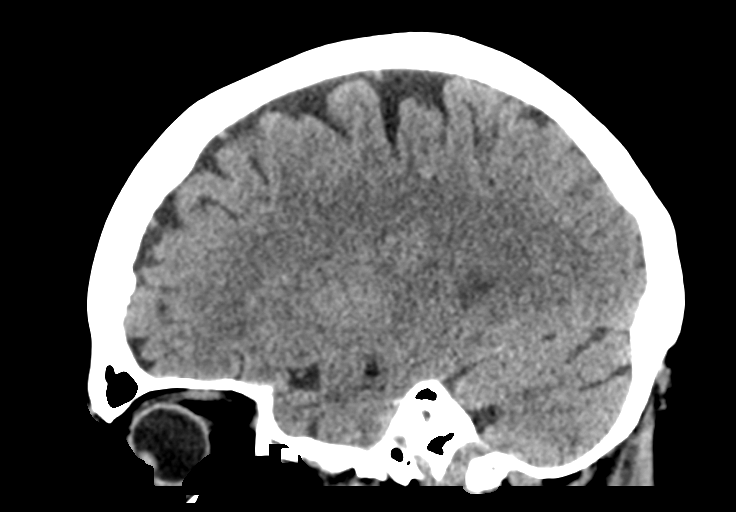
[im 23/45  brain]
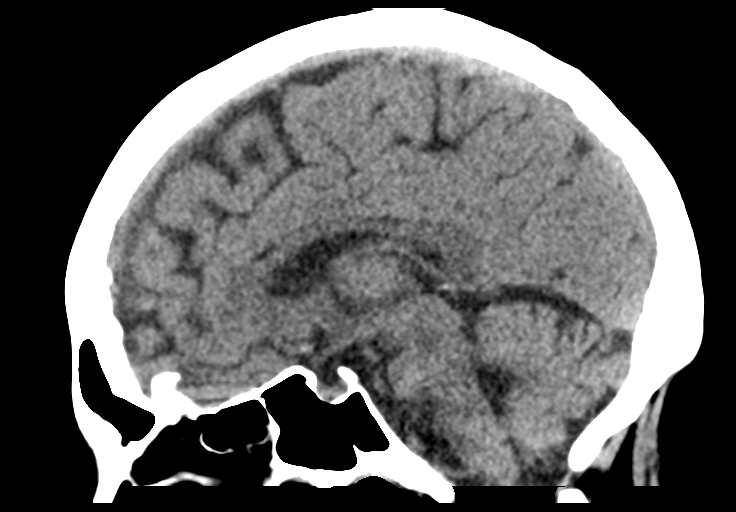
[im 30/45  brain]
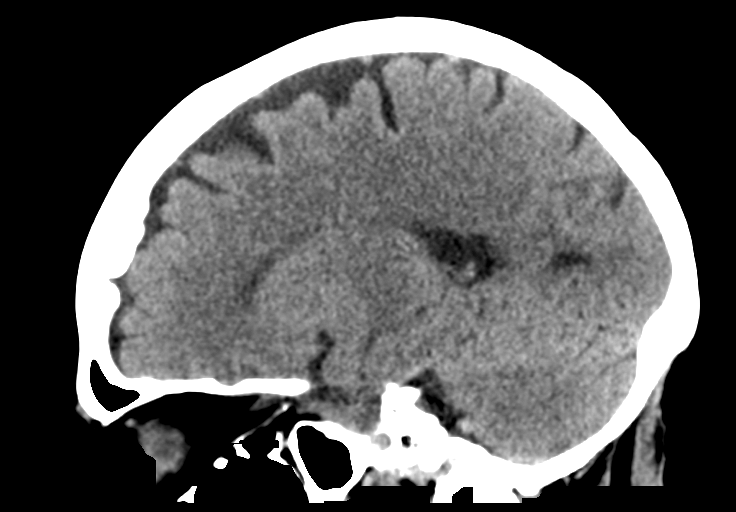

[15 of 47 positions shown; findings below may reference images not displayed]

FINDINGS: Brain: No intracranial hemorrhage, mass effect, or midline shift. No
evidence of intracranial mass on noncontrast CT. No hydrocephalus.
The basilar cisterns are patent. No evidence of territorial infarct
or acute ischemia. No extra-axial or intracranial fluid collection.

Vascular: No hyperdense vessel.

Skull: No focal lesion.  No skull fracture.

Sinuses/Orbits: Paranasal sinuses and mastoid air cells are clear.
The visualized orbits are unremarkable.

Other: None.
IMPRESSION: 1.  No acute intracranial abnormality.
2. No evidence of intracranial metastatic disease or mass on
noncontrast head CT. If there is persistent clinical concern,
recommend brain MRI, preferably with contrast.

## 2018-11-10 ENCOUNTER — Other Ambulatory Visit: Payer: Self-pay | Admitting: Family Medicine

## 2018-11-10 ENCOUNTER — Other Ambulatory Visit: Payer: Medicare Other

## 2018-11-10 ENCOUNTER — Ambulatory Visit: Payer: Medicare Other | Admitting: Oncology

## 2018-11-10 DIAGNOSIS — L409 Psoriasis, unspecified: Secondary | ICD-10-CM

## 2018-11-10 DIAGNOSIS — E039 Hypothyroidism, unspecified: Secondary | ICD-10-CM

## 2018-11-10 NOTE — Telephone Encounter (Signed)
Is this okay to refill? 

## 2018-11-11 DIAGNOSIS — M47816 Spondylosis without myelopathy or radiculopathy, lumbar region: Secondary | ICD-10-CM | POA: Diagnosis not present

## 2018-11-11 DIAGNOSIS — M5136 Other intervertebral disc degeneration, lumbar region: Secondary | ICD-10-CM | POA: Diagnosis not present

## 2018-11-11 DIAGNOSIS — M48062 Spinal stenosis, lumbar region with neurogenic claudication: Secondary | ICD-10-CM | POA: Diagnosis not present

## 2018-11-11 DIAGNOSIS — M5442 Lumbago with sciatica, left side: Secondary | ICD-10-CM | POA: Diagnosis not present

## 2018-12-18 ENCOUNTER — Encounter: Payer: Self-pay | Admitting: General Practice

## 2018-12-18 DIAGNOSIS — M48061 Spinal stenosis, lumbar region without neurogenic claudication: Secondary | ICD-10-CM | POA: Diagnosis not present

## 2018-12-18 DIAGNOSIS — M5136 Other intervertebral disc degeneration, lumbar region: Secondary | ICD-10-CM | POA: Diagnosis not present

## 2018-12-18 DIAGNOSIS — M4726 Other spondylosis with radiculopathy, lumbar region: Secondary | ICD-10-CM | POA: Diagnosis not present

## 2018-12-18 NOTE — Progress Notes (Signed)
[place] Team contacted patient to assess for food insecurity and other psychosocial needs during current COVID19 pandemic.  "No issues, other than being hot 24 hours/day due to Tamoxifen."  Tries to stay in as much as possible.   Patient/family expressed no needs at this time.  Support Team member encouraged patient to call if changes occur or they have any other questions/concerns.    Beverely Pace, [place]

## 2019-01-04 DIAGNOSIS — H40033 Anatomical narrow angle, bilateral: Secondary | ICD-10-CM | POA: Diagnosis not present

## 2019-01-12 ENCOUNTER — Other Ambulatory Visit: Payer: Self-pay | Admitting: Oncology

## 2019-01-12 DIAGNOSIS — Z9889 Other specified postprocedural states: Secondary | ICD-10-CM

## 2019-01-21 ENCOUNTER — Other Ambulatory Visit: Payer: Self-pay

## 2019-01-21 ENCOUNTER — Other Ambulatory Visit: Payer: Medicare Other

## 2019-01-21 DIAGNOSIS — E7800 Pure hypercholesterolemia, unspecified: Secondary | ICD-10-CM | POA: Diagnosis not present

## 2019-01-22 LAB — LIPID PANEL
Chol/HDL Ratio: 3.5 ratio (ref 0.0–4.4)
Cholesterol, Total: 247 mg/dL — ABNORMAL HIGH (ref 100–199)
HDL: 70 mg/dL (ref >39)
LDL Calculated: 151 mg/dL — ABNORMAL HIGH (ref 0–99)
Triglycerides: 129 mg/dL (ref 0–149)
VLDL Cholesterol Cal: 26 mg/dL (ref 5–40)

## 2019-01-22 NOTE — Progress Notes (Signed)
Start time: 8:43 End time: 9:10  Virtual Visit via Video Note  I connected with Mckenzie Webb on 01/25/2019 by a video enabled telemedicine application and verified that I am speaking with the correct person using two identifiers.  Location: Patient: home, alone in room Provider: office   I discussed the limitations of evaluation and management by telemedicine and the availability of in person appointments. The patient expressed understanding and agreed to proceed. She consents to her insurance being filed for this visit.  We had some technical difficulties halfway through the visit, lost the connection.  Completed the visit over the telephone.  More than 1/2 the visit was done over video.  History of Present Illness:  Chief Complaint  Patient presents with  . Other    discuss lab results    Patient presents to discuss her elevated lipids today.  They were above goal on last check in January, but at that point wasn't interested in changing her regimen. She takes red yeast rice(2 BID), and 67fsh oil capsulesdaily.   At her visit in January, her diet included: infrequent red meat; 5-7 eggs/week (eats bacon, egg and toast daily for breakfast). Infrequent cheese.  Doesn't use creamy sauces/dressings, uses mustard and not mayo.  We had discussed cutting back on egg yolks, bacon, and discussed considering change to Crestor (which would be more effective in lowering cholesterol, and be fewer pills).  She had lipids rechecked last week, and were higher.  Lab Results  Component Value Date   CHOL 247 (H) 01/21/2019   HDL 70 01/21/2019   LDLCALC 151 (H) 01/21/2019   TRIG 129 01/21/2019   CHOLHDL 3.5 01/21/2019   She stopped eating bacon and eggs at last visit.  Eating Cheerios +/- toast, with 2% milk. Has had burgers on the grill more often since not eating out. Red meat 2x/week. She has tried lipitor in the past, and she "felt like she couldn't move", achey in her legs. This was many  years ago (prior to being on EMR), and has been on RYR ever since.  BP is usually 120's-low 130's/75-78. Reports she was somewhat rushed, checked BP while on the phone with the nurse this morning.  Usually lower. Denies headaches.  PMH, PSH, SH reviewed  Outpatient Encounter Medications as of 01/25/2019  Medication Sig Note  . clobetasol cream (TEMOVATE) 0.05 % APPLY TOPICALLY TWO TIMES DAILY AS NEEDED FOR PSORIASIS. USE SPARINGLY.   .Marland KitchenCoenzyme Q10 (CO Q-10) 100 MG CAPS Take by mouth daily.   . hydrochlorothiazide (MICROZIDE) 12.5 MG capsule TAKE 1 CAPSULE BY MOUTH EVERY DAY   . levothyroxine (SYNTHROID, LEVOTHROID) 75 MCG tablet TAKE 1 TABLET BY MOUTH EVERY DAY   . loratadine (CLARITIN) 10 MG tablet Take 10 mg by mouth daily as needed for allergies.    . Multiple Vitamins-Minerals (ALIVE WOMENS 50+ PO) Take 1 tablet by mouth daily.    . naproxen (NAPROSYN) 500 MG tablet Take 1 tablet (500 mg total) by mouth 2 (two) times daily with a meal.   . nitrofurantoin, macrocrystal-monohydrate, (MACROBID) 100 MG capsule Take 100 mg by mouth 2 (two) times daily. 09/03/2018: Took regularly once daily for a month, stopped a week ago (used to only use after intercourse).  . Omega-3 Fatty Acids (FISH OIL) 1200 MG CAPS Take 1,200 mg by mouth at bedtime.  09/03/2018: Takes 2 at bedtime  . omeprazole (PRILOSEC) 40 MG capsule Take 1 capsule (40 mg total) by mouth daily.   .[place] PrestoYeast Rice  Extract (RED YEAST RICE PO) Take 1,200 mg by mouth 2 (two) times daily.    . tamoxifen (NOLVADEX) 20 MG tablet Take 1 tablet (20 mg total) by mouth daily.   Marland Kitchen venlafaxine XR (EFFEXOR-XR) 37.5 MG 24 hr capsule Take 1 capsule (37.5 mg total) by mouth daily with breakfast.   . vitamin B-12 (CYANOCOBALAMIN) 1000 MCG tablet Take 1,000 mcg by mouth daily.   . [DISCONTINUED] erythromycin ophthalmic ointment APPLY A SMALL AMOUNT ONTO LEFT LOWER EYELID 3 TIMES A DAY FOR 1 WEEK.UNTIL SCAB FALLS OFF    No facility-administered encounter  medications on file as of 01/25/2019.    Allergies  Allergen Reactions  . Adhesive [Tape] Other (See Comments)    SKIN TEARS  . Ciprofloxacin Nausea Only  . Codeine Nausea Only  . Iodine Swelling  . Polysporin [Bacitracin-Polymyxin B] Swelling    OPHTHALMIC - SWELLING OF EYES  . Penicillins Rash       . Sulfa Antibiotics Itching   ROS: no headaches, dizziness, chest pain. No fever, chills, URI symptoms.  No myalgias. Moods are good.   Observations/Objective:  BP 138/86   Temp 98.5 F (36.9 C)   Wt 169 lb (76.7 kg)   BMI 29.01 kg/m  Weight on home scale today  Wt Readings from Last 3 Encounters:  10/21/18 174 lb 3.2 oz (79 kg)  09/03/18 177 lb 6.4 oz (80.5 kg)  08/06/18 177 lb 12.8 oz (80.6 kg)   She is alert, oriented, and in good spirits. Cranial nerves are grossly intact. Normal mood, affect, grooming. Exam is otherwise limited due to the virtual nature of the visit.   Assessment and Plan:  Pure hypercholesterolemia - Stop RYR and change to Crestor.  Risks/SE reviewed in detail, as well as alternate dosing if needed. Cont CoQ10 - Plan: rosuvastatin (CRESTOR) 10 MG tablet, Lipid panel  Essential hypertension, benign - BP's lower at home (checked while rushed and on phone with nurse today) usually; Goals reviewed.  Medication monitoring encounter - Plan: Lipid panel, Hepatic function panel   ASCVD risk is 14.7%, intermediate risk Risk would be reduced to 11.1% using statin, further if BP optimized, and down to 8.1% with statin and improved BP  Start Crestor 42m once daily. Continue the Coenzyme Q10 daily. STOP the red yeast rice. It is up to you whether or not you would like to continue taking the omega-3 fish oil (probably will not be necessary for your cholesterol).  If you are having side effects from the Crestor, you can try either cutting the dose in half, or taking it every other day.  Please contact me with any issues, side effects, dose  changes.   Follow Up Instructions:    I discussed the assessment and treatment plan with the patient. The patient was provided an opportunity to ask questions and all were answered. The patient agreed with the plan and demonstrated an understanding of the instructions.   The patient was advised to call back or seek an in-person evaluation if the symptoms worsen or if the condition fails to improve as anticipated.  I provided 27 minutes of non-face-to-face time during this encounter. More than 1/2 spent counseling (dietary counseling, risks/SE of medications, CV risks)   EVikki Ports MD

## 2019-01-25 ENCOUNTER — Encounter: Payer: Self-pay | Admitting: Family Medicine

## 2019-01-25 ENCOUNTER — Other Ambulatory Visit: Payer: Self-pay

## 2019-01-25 ENCOUNTER — Ambulatory Visit (INDEPENDENT_AMBULATORY_CARE_PROVIDER_SITE_OTHER): Payer: Medicare Other | Admitting: Family Medicine

## 2019-01-25 VITALS — BP 138/86 | Temp 98.5°F | Wt 169.0 lb

## 2019-01-25 DIAGNOSIS — I10 Essential (primary) hypertension: Secondary | ICD-10-CM | POA: Diagnosis not present

## 2019-01-25 DIAGNOSIS — E7800 Pure hypercholesterolemia, unspecified: Secondary | ICD-10-CM

## 2019-01-25 DIAGNOSIS — Z5181 Encounter for therapeutic drug level monitoring: Secondary | ICD-10-CM | POA: Diagnosis not present

## 2019-01-25 MED ORDER — ROSUVASTATIN CALCIUM 10 MG PO TABS: 10.0000 mg | ORAL_TABLET | Freq: Every day | ORAL | 2 refills | Status: DC

## 2019-01-25 NOTE — Progress Notes (Signed)
done

## 2019-01-25 NOTE — Patient Instructions (Signed)
Start Crestor 58m once daily. Continue the Coenzyme Q10 daily. STOP the red yeast rice. It is up to you whether or not you would like to continue taking the omega-3 fish oil (probably will not be necessary for your cholesterol).  If you are having side effects from the Crestor, you can try either cutting the dose in half, or taking it every other day.  Please contact me with any issues, side effects, dose changes.   Fat and Cholesterol Restricted Eating Plan Getting too much fat and cholesterol in your diet may cause health problems. Choosing the right foods helps keep your fat and cholesterol at normal levels. This can keep you from getting certain diseases.  Meal planning  At meals, divide your plate into four equal parts: ? Fill one-half of your plate with vegetables and green salads. ? Fill one-fourth of your plate with whole grains. ? Fill one-fourth of your plate with low-fat (lean) protein foods.  Eat fish that is high in omega-3 fats at least two times a week. This includes mackerel, tuna, sardines, and salmon.  Eat foods that are high in fiber, such as whole grains, beans, apples, broccoli, carrots, peas, and barley. General tips   Work with your doctor to lose weight if you need to.  Avoid: ? Foods with added sugar. ? Fried foods. ? Foods with partially hydrogenated oils.  Limit alcohol intake to no more than 1 drink a day for nonpregnant women and 2 drinks a day for men. One drink equals 12 oz of beer, 5 oz of wine, or 1 oz of hard liquor. Reading food labels  Check food labels for: ? Trans fats. ? Partially hydrogenated oils. ? Saturated fat (g) in each serving. ? Cholesterol (mg) in each serving. ? Fiber (g) in each serving.  Choose foods with healthy fats, such as: ? Monounsaturated fats. ? Polyunsaturated fats. ? Omega-3 fats.  Choose grain products that have whole grains. Look for the word "whole" as the first word in the ingredient list. Cooking   Cook foods using low-fat methods. These include baking, boiling, grilling, and broiling.  Eat more home-cooked foods. Eat at restaurants and buffets less often.  Avoid cooking using saturated fats, such as butter, cream, palm oil, palm kernel oil, and coconut oil. Recommended foods  Fruits  All fresh, canned (in natural juice), or frozen fruits. Vegetables  Fresh or frozen vegetables (raw, steamed, roasted, or grilled). Green salads. Grains  Whole grains, such as whole wheat or whole grain breads, crackers, cereals, and pasta. Unsweetened oatmeal, bulgur, barley, quinoa, or brown rice. Corn or whole wheat flour tortillas. Meats and other protein foods  Ground beef (85% or leaner), grass-fed beef, or beef trimmed of fat. Skinless chicken or t[place] Ground chicken or t[place] Pork trimmed of fat. All fish and seafood. Egg whites. Dried beans, peas, or lentils. Unsalted nuts or seeds. Unsalted canned beans. Nut butters without added sugar or oil. Dairy  Low-fat or nonfat dairy products, such as skim or 1% milk, 2% or reduced-fat cheeses, low-fat and fat-free ricotta or cottage cheese, or plain low-fat and nonfat yogurt. Fats and oils  Tub margarine without trans fats. Light or reduced-fat mayonnaise and salad dressings. Avocado. Olive, canola, sesame, or safflower oils. The items listed above may not be a complete list of foods and beverages you can eat. Contact a dietitian for more information. Foods to avoid Fruits  Canned fruit in heavy syrup. Fruit in cream or butter sauce. Fried fruit. Vegetables  Vegetables  cooked in cheese, cream, or butter sauce. Fried vegetables. Grains  White bread. White pasta. White rice. Cornbread. Bagels, pastries, and croissants. Crackers and snack foods that contain trans fat and hydrogenated oils. Meats and other protein foods  Fatty cuts of meat. Ribs, chicken wings, bacon, sausage, bologna, salami, chitterlings, fatback, hot dogs, bratwurst, and  packaged lunch meats. Liver and organ meats. Whole eggs and egg yolks. Chicken and [place] with skin. Fried meat. Dairy  Whole or 2% milk, cream, half-and-half, and cream cheese. Whole milk cheeses. Whole-fat or sweetened yogurt. Full-fat cheeses. Nondairy creamers and whipped toppings. Processed cheese, cheese spreads, and cheese curds. Beverages  Alcohol. Sugar-sweetened drinks such as sodas, lemonade, and fruit drinks. Fats and oils  Butter, stick margarine, lard, shortening, ghee, or bacon fat. Coconut, palm kernel, and palm oils. Sweets and desserts  Corn syrup, sugars, honey, and molasses. Candy. Jam and jelly. Syrup. Sweetened cereals. Cookies, pies, cakes, donuts, muffins, and ice cream. The items listed above may not be a complete list of foods and beverages you should avoid. Contact a dietitian for more information. Summary  Choosing the right foods helps keep your fat and cholesterol at normal levels. This can keep you from getting certain diseases.  At meals, fill one-half of your plate with vegetables and green salads.  Eat high-fiber foods, like whole grains, beans, apples, carrots, peas, and barley.  Limit added sugar, saturated fats, alcohol, and fried foods. This information is not intended to replace advice given to you by your health care provider. Make sure you discuss any questions you have with your health care provider. Document Released: 02/04/2012 Document Revised: 04/08/2018 Document Reviewed: 04/22/2017 Elsevier Interactive Patient Education  2019 [employer].

## 2019-02-03 ENCOUNTER — Other Ambulatory Visit: Payer: Self-pay | Admitting: Family Medicine

## 2019-02-03 DIAGNOSIS — E039 Hypothyroidism, unspecified: Secondary | ICD-10-CM

## 2019-02-04 ENCOUNTER — Other Ambulatory Visit: Payer: Self-pay | Admitting: Family Medicine

## 2019-02-04 DIAGNOSIS — L409 Psoriasis, unspecified: Secondary | ICD-10-CM

## 2019-02-04 NOTE — Telephone Encounter (Signed)
Is this okay to refill? 

## 2019-02-26 ENCOUNTER — Ambulatory Visit
Admission: RE | Admit: 2019-02-26 | Discharge: 2019-02-26 | Disposition: A | Discharge status: Home / Self Care | Payer: Medicare Other | Source: Ambulatory Visit | Attending: Oncology | Admitting: Oncology

## 2019-02-26 ENCOUNTER — Other Ambulatory Visit: Payer: Self-pay

## 2019-02-26 DIAGNOSIS — R928 Other abnormal and inconclusive findings on diagnostic imaging of breast: Secondary | ICD-10-CM | POA: Diagnosis not present

## 2019-02-26 DIAGNOSIS — Z9889 Other specified postprocedural states: Secondary | ICD-10-CM

## 2019-03-17 ENCOUNTER — Other Ambulatory Visit: Payer: Self-pay | Admitting: Family Medicine

## 2019-03-17 DIAGNOSIS — L409 Psoriasis, unspecified: Secondary | ICD-10-CM

## 2019-03-17 NOTE — Telephone Encounter (Signed)
Patient state she does need a refill on this due to a huge break out

## 2019-04-06 DIAGNOSIS — M5442 Lumbago with sciatica, left side: Secondary | ICD-10-CM | POA: Diagnosis not present

## 2019-04-06 DIAGNOSIS — M47816 Spondylosis without myelopathy or radiculopathy, lumbar region: Secondary | ICD-10-CM | POA: Diagnosis not present

## 2019-04-06 DIAGNOSIS — M5136 Other intervertebral disc degeneration, lumbar region: Secondary | ICD-10-CM | POA: Diagnosis not present

## 2019-04-06 DIAGNOSIS — M48062 Spinal stenosis, lumbar region with neurogenic claudication: Secondary | ICD-10-CM | POA: Diagnosis not present

## 2019-04-07 ENCOUNTER — Other Ambulatory Visit: Payer: Medicare Other

## 2019-04-07 ENCOUNTER — Other Ambulatory Visit: Payer: Self-pay

## 2019-04-07 DIAGNOSIS — Z5181 Encounter for therapeutic drug level monitoring: Secondary | ICD-10-CM | POA: Diagnosis not present

## 2019-04-07 DIAGNOSIS — E7800 Pure hypercholesterolemia, unspecified: Secondary | ICD-10-CM | POA: Diagnosis not present

## 2019-04-08 ENCOUNTER — Encounter: Payer: Self-pay | Admitting: Family Medicine

## 2019-04-08 DIAGNOSIS — E7800 Pure hypercholesterolemia, unspecified: Secondary | ICD-10-CM

## 2019-04-08 LAB — HEPATIC FUNCTION PANEL
ALT: 18 IU/L (ref 0–32)
AST: 20 IU/L (ref 0–40)
Albumin: 4.2 g/dL (ref 3.8–4.8)
Alkaline Phosphatase: 51 IU/L (ref 39–117)
Bilirubin Total: 0.5 mg/dL (ref 0.0–1.2)
Bilirubin, Direct: 0.15 mg/dL (ref 0.00–0.40)
Total Protein: 6.5 g/dL (ref 6.0–8.5)

## 2019-04-08 LAB — LIPID PANEL
Chol/HDL Ratio: 2.8 ratio (ref 0.0–4.4)
Cholesterol, Total: 165 mg/dL (ref 100–199)
HDL: 58 mg/dL (ref >39)
LDL Calculated: 79 mg/dL (ref 0–99)
Triglycerides: 140 mg/dL (ref 0–149)
VLDL Cholesterol Cal: 28 mg/dL (ref 5–40)

## 2019-04-08 MED ORDER — ROSUVASTATIN CALCIUM 10 MG PO TABS: 10.0000 mg | ORAL_TABLET | ORAL | 1 refills | Status: DC

## 2019-04-17 ENCOUNTER — Other Ambulatory Visit: Payer: Self-pay | Admitting: Family Medicine

## 2019-04-17 DIAGNOSIS — I10 Essential (primary) hypertension: Secondary | ICD-10-CM

## 2019-04-19 ENCOUNTER — Other Ambulatory Visit: Payer: Self-pay | Admitting: Family Medicine

## 2019-04-19 DIAGNOSIS — E7800 Pure hypercholesterolemia, unspecified: Secondary | ICD-10-CM

## 2019-04-27 DIAGNOSIS — Z170 Estrogen receptor positive status [ER+]: Secondary | ICD-10-CM | POA: Diagnosis not present

## 2019-04-27 DIAGNOSIS — Z6826 Body mass index (BMI) 26.0-26.9, adult: Secondary | ICD-10-CM | POA: Diagnosis not present

## 2019-04-27 DIAGNOSIS — C50111 Malignant neoplasm of central portion of right female breast: Secondary | ICD-10-CM | POA: Diagnosis not present

## 2019-04-27 DIAGNOSIS — Z124 Encounter for screening for malignant neoplasm of cervix: Secondary | ICD-10-CM | POA: Diagnosis not present

## 2019-04-28 ENCOUNTER — Other Ambulatory Visit: Payer: Self-pay | Admitting: Family Medicine

## 2019-04-28 DIAGNOSIS — E039 Hypothyroidism, unspecified: Secondary | ICD-10-CM

## 2019-04-28 NOTE — Telephone Encounter (Signed)
error 

## 2019-04-29 LAB — HM PAP SMEAR: HM Pap smear: NEGATIVE

## 2019-04-30 ENCOUNTER — Telehealth: Payer: Self-pay | Admitting: Family Medicine

## 2019-04-30 NOTE — Telephone Encounter (Signed)
Requested records received from [facility]

## 2019-05-03 ENCOUNTER — Encounter: Payer: Self-pay | Admitting: *Deleted

## 2019-05-03 NOTE — Progress Notes (Signed)
[place]  [phone] [phone]     ID: Mckenzie Webb DOB: May 30, 1946  MR#: [id number]  FSE#:[id number]  Patient Care Team: Rita Ohara, MD as PCP - General (Family Medicine) Jovita Kussmaul, MD as Consulting Physician (General Surgery) , Virgie Dad, MD as Consulting Physician (Oncology) Eppie Gibson, MD as Attending Physician (Radiation Oncology) Juanita Craver, MD as Consulting Physician (Gastroenterology) Vania Rea, MD as Consulting Physician (Obstetrics and Gynecology) Elsie Saas, MD as Consulting Physician (Orthopedic Surgery) Martinique, Amy, MD as Consulting Physician (Dermatology) Delice Bison, Charlestine Massed, NP as Nurse Practitioner (Hematology and Oncology) OTHER MD:   CHIEF COMPLAINT: Estrogen receptor positive breast cancer  CURRENT TREATMENT: tamoxifen at 10 mg   BREAST CANCER HISTORY: From the original intake note:  "Mckenzie Webb" had bilateral screening mammography with tomography at the [facility] 02/21/2017. This showed a possible mass in the right breast. Right diagnostic mammography with tomography and ultrasonography 02/27/2017 found a breast density to be category B. In the subareolar right breast there was a 0.5 cm spiculated mass, which was not palpable. There was mild right nipple inversion, which per report was chronic. Ultrasonography confirmed a 0.5 cm retroareolar breast mass at the 1:00 radiant. The right axilla was sonographically benign.  On 03/03/2017 the patient underwent biopsy of the right breast mass in question. This showed (SAA [phone]) and invasive ductal carcinoma, grade 1, estrogen receptor 100% positive, progesterone receptor negative, with an MIB-1 of 3%, and no HER-2 implication, the signals ratio being 1.25 and the number per cell 2.00.  The patient's subsequent history is as detailed below.   INTERVAL HISTORY: Mckenzie Webb returns today for follow-up and treatment of her estrogen receptor positive  breast cancer. She was last seen here on 07/30/2018.   She continues on tamoxifen.  She has "horrendous" hot flashes.  She will be taking a shower and start sweating.  Vaginal wetness or discharge is not a major issue.  She started venlafaxine at very low dose.  This does not seem to have had a major effect.  Since her last visit here, she underwent a digital diagnostic bilateral mammogram with tomography on 02/26/2019 showing: Breast Density Category B. There is no mammographic evidence of malignancy.    REVIEW OF SYSTEMS: Mckenzie Webb exercises by walking a mile to a mile and a half every day.  She takes care of her 54-year-old granddaughter 2 days a week to help her parents out since they both work.  They are all taking appropriate COVID precautions.  Aside from these issues a detailed review of systems today was stable.   PAST MEDICAL HISTORY: Past Medical History:  Diagnosis Date  . Arthritis    lower back  . Cough 07/22/2017  . Dental crowns present   . Family history of adverse reaction to anesthesia    pt's sister has hx. of post-op N/V  . GERD (gastroesophageal reflux disease)   . History of chemotherapy    finished chemo 06/30/2017  . History of radiation therapy 07/29/17- 08/28/17   Right Breast and Axilla treated to 42.56 Gy with 16 fx of 2.66. Boost of 8 Gy with 4 fx of 2 Gy.   Marland Kitchen History of right breast cancer 02/2017  . Hyperlipidemia   . Hypothyroid   . Leukopenia 07/07/2017  . Personal history of chemotherapy   . Personal history of radiation therapy   . Stuffy and runny nose 07/22/2017   clear drainage from nose, per pt.    PAST SURGICAL HISTORY: Past Surgical  History:  Procedure Laterality Date  . BREAST LUMPECTOMY Right 03/26/2017  . BREAST LUMPECTOMY WITH RADIOACTIVE SEED AND SENTINEL LYMPH NODE BIOPSY Right 03/26/2017   Procedure: BREAST LUMPECTOMY WITH RADIOACTIVE SEED AND SENTINEL LYMPH NODE BIOPSY;  Surgeon: Jovita Kussmaul, MD;  Location: [facility];  Service: General;  Laterality: Right;  . CHOLECYSTECTOMY    . COLONOSCOPY  3/09  . CYST EXCISION  02/01/2003   knee  . KNEE ARTHROSCOPY Right 1990  . KNEE ARTHROSCOPY Left 12/12/2010  . PORT-A-CATH REMOVAL N/A 09/03/2017   Procedure: REMOVAL PORT-A-CATH;  Surgeon: Jovita Kussmaul, MD;  Location: [facility];  Service: General;  Laterality: N/A;  . PORTACATH PLACEMENT Left 04/24/2017   Procedure: INSERTION PORT-A-CATH;  Surgeon: Jovita Kussmaul, MD;  Location: [facility];  Service: General;  Laterality: Left;  . TUBAL LIGATION  1983  . VARICOSE VEIN SURGERY  09/2009 R, 06/2010 L    FAMILY HISTORY Family History  Problem Relation Age of Onset  . Hypertension Mother   . Heart disease Mother   . Gallbladder disease Mother   . Thyroid disease Mother   . Arthritis Mother   . Diabetes Father   . Cancer Father        pancreatic  . Hypertension Sister   . Gallbladder disease Sister   . Diabetes Sister   . Diverticulitis Sister        of small intestine  . Cancer Son        testicular cancer  . Stroke Sister        late 19's  . Hypertension Sister   . Pulmonary embolism Sister   . Ulcerative colitis Sister   . COPD Brother        had lung lobe removed for suspected cancer, but was benign; smoker  . Colon cancer Other 6  . Lung cancer Cousin        maternal first cousin - smoker  The patient's father died from pancreatic cancer at the age of 63. The patient's mother died from a myocardial infarction at age 79. The patient had one brother, 3 sisters. The patient's older son was diagnosed with testicular cancer at age 74. A maternal niece was diagnosed with colon cancer at age 67 and a maternal cousin with lung cancer at an unknown age.  GYNECOLOGIC HISTORY:  No LMP recorded. Patient is postmenopausal.  Menarche age 70, first live birth age 63, the patient is [place] P2. she went through the change of life approximately the year 2000. She did not take hormone replacement.  She did take oral contraceptives for more than 20 years remotely, with no complications.  SOCIAL HISTORY:  She is a retired Web designer. Her husband Arnette Norris is an Clinical biochemist, still working part-time. Son Corene Cornea (from the patient's first marriage) lives in [place] and works in Therapist, art. Son Catalina Antigua (from patient second marriage) lives in [place] we are is a Administrator, arts. The patient has one grandchild. She is not a Ambulance person.   ADVANCED DIRECTIVES: Not in place   HEALTH MAINTENANCE: Social History   Tobacco Use  . Smoking status: Never Smoker  . Smokeless tobacco: Never Used  Substance Use Topics  . Alcohol use: Not Currently    Comment: none since her cancer diagnosis 2018  . Drug use: No     Colonoscopy: April 2019/ Dr. Mann/ normal  PAP:  [facility], T score -0.1 (normal)   Allergies  Allergen  Reactions  . Adhesive [Tape] Other (See Comments)    SKIN TEARS  . Ciprofloxacin Nausea Only  . Codeine Nausea Only  . Iodine Swelling  . Polysporin [Bacitracin-Polymyxin B] Swelling    OPHTHALMIC - SWELLING OF EYES  . Penicillins Rash       . Sulfa Antibiotics Itching    Current Outpatient Medications  Medication Sig Dispense Refill  . clobetasol cream (TEMOVATE) 0.05 % APPLY TOPICALLY TWO TIMES DAILY AS NEEDED FOR PSORIASIS. USE SPARINGLY. 60 g 0  . Coenzyme Q10 (CO Q-10) 100 MG CAPS Take by mouth daily.    . hydrochlorothiazide (MICROZIDE) 12.5 MG capsule TAKE 1 CAPSULE BY MOUTH EVERY DAY 90 capsule 1  . levothyroxine (SYNTHROID) 75 MCG tablet TAKE 1 TABLET BY MOUTH EVERY DAY 90 tablet 0  . loratadine (CLARITIN) 10 MG tablet Take 10 mg by mouth daily as needed for allergies.     . Multiple Vitamins-Minerals (ALIVE WOMENS 50+ PO) Take 1 tablet by mouth daily.     . nitrofurantoin, macrocrystal-monohydrate, (MACROBID) 100 MG capsule Take 100 mg by mouth 2 (two) times daily.    Marland Kitchen omeprazole (PRILOSEC) 40 MG capsule  Take 1 capsule (40 mg total) by mouth daily. 90 capsule 3  . rosuvastatin (CRESTOR) 10 MG tablet TAKE 1 TABLET BY MOUTH EVERY DAY 90 tablet 1  . tamoxifen (NOLVADEX) 10 MG tablet Take 1 tablet (10 mg total) by mouth daily. 90 tablet 4  . venlafaxine XR (EFFEXOR-XR) 75 MG 24 hr capsule Take 1 capsule (75 mg total) by mouth daily with breakfast. 90 capsule 4  . vitamin B-12 (CYANOCOBALAMIN) 1000 MCG tablet Take 1,000 mcg by mouth daily.     No current facility-administered medications for this visit.     OBJECTIVE: Middle-aged white woman who appears stated age  32:   05/04/19 1400  BP: (!) 146/65  Pulse: 64  Resp: 18  Temp: 98.3 F (36.8 C)  SpO2: 100%   Wt Readings from Last 3 Encounters:  05/04/19 161 lb 12.8 oz (73.4 kg)  01/25/19 169 lb (76.7 kg)  10/21/18 174 lb 3.2 oz (79 kg)   Body mass index is 27.77 kg/m.    ECOG FS:1 - Symptomatic but completely ambulatory  Ocular: Sclerae unicteric, pupils round and equal Ear-nose-throat: Wearing a mask Lymphatic: No cervical or supraclavicular adenopathy Lungs no rales or rhonchi Heart regular rate and rhythm Abd soft, nontender, positive bowel sounds MSK no focal spinal tenderness, no joint edema Neuro: non-focal, well-oriented, appropriate affect Breasts: The right breast is status post lumpectomy followed by radiation with no evidence of local recurrence.  The left breast is unremarkable.  Both axillae are benign.  LAB RESULTS:  CMP     Component Value Date/Time   NA 142 07/30/2018 1109   NA 142 08/01/2017 1056   K 3.7 07/30/2018 1109   K 4.4 08/01/2017 1056   CL 106 07/30/2018 1109   CO2 27 07/30/2018 1109   CO2 24 08/01/2017 1056   GLUCOSE 99 09/03/2018 1000   GLUCOSE 118 (H) 07/30/2018 1109   GLUCOSE 101 08/01/2017 1056   BUN 13 07/30/2018 1109   BUN 11.1 08/01/2017 1056   CREATININE 0.80 07/30/2018 1109   CREATININE 0.8 08/01/2017 1056   CALCIUM 9.3 07/30/2018 1109   CALCIUM 9.6 08/01/2017 1056   PROT  6.5 04/07/2019 0930   PROT 6.6 08/01/2017 1056   ALBUMIN 4.2 04/07/2019 0930   ALBUMIN 3.6 08/01/2017 1056   AST 20 04/07/2019 0930   AST  18 08/01/2017 1056   ALT 18 04/07/2019 0930   ALT 17 08/01/2017 1056   ALKPHOS 51 04/07/2019 0930   ALKPHOS 67 08/01/2017 1056   BILITOT 0.5 04/07/2019 0930   BILITOT 0.38 08/01/2017 1056   GFRNONAA >60 07/30/2018 1109   GFRAA >60 07/30/2018 1109    No results found for: TOTALPROTELP, ALBUMINELP, A1GS, A2GS, BETS, BETA2SER, GAMS, MSPIKE, SPEI  No results found for: Nils Pyle, [facility]  Lab Results  Component Value Date   WBC 3.9 (L) 05/04/2019   NEUTROABS 2.0 05/04/2019   HGB 12.9 05/04/2019   HCT 38.9 05/04/2019   MCV 97.7 05/04/2019   PLT 131 (L) 05/04/2019      Chemistry      Component Value Date/Time   NA 142 07/30/2018 1109   NA 142 08/01/2017 1056   K 3.7 07/30/2018 1109   K 4.4 08/01/2017 1056   CL 106 07/30/2018 1109   CO2 27 07/30/2018 1109   CO2 24 08/01/2017 1056   BUN 13 07/30/2018 1109   BUN 11.1 08/01/2017 1056   CREATININE 0.80 07/30/2018 1109   CREATININE 0.8 08/01/2017 1056      Component Value Date/Time   CALCIUM 9.3 07/30/2018 1109   CALCIUM 9.6 08/01/2017 1056   ALKPHOS 51 04/07/2019 0930   ALKPHOS 67 08/01/2017 1056   AST 20 04/07/2019 0930   AST 18 08/01/2017 1056   ALT 18 04/07/2019 0930   ALT 17 08/01/2017 1056   BILITOT 0.5 04/07/2019 0930   BILITOT 0.38 08/01/2017 1056       No results found for: LABCA2  No components found for: [id number]  No results for input(s): INR in the last 168 hours.  Urinalysis    Component Value Date/Time   COLORURINE YELLOW 05/02/2017 2201   APPEARANCEUR HAZY (A) 05/02/2017 2201   LABSPEC 1.015 10/21/2018 1107   PHURINE 6.0 05/02/2017 2201   GLUCOSEU NEGATIVE 05/02/2017 2201   HGBUR SMALL (A) 05/02/2017 2201   BILIRUBINUR negative 10/21/2018 1107   BILIRUBINUR neg 08/17/2014 1123   KETONESUR negative 10/21/2018 1107   KETONESUR  NEGATIVE 05/02/2017 2201   PROTEINUR negative 10/21/2018 1107   PROTEINUR NEGATIVE 05/02/2017 2201   UROBILINOGEN negative 08/17/2014 1123   NITRITE Negative 10/21/2018 1107   NITRITE POSITIVE (A) 05/02/2017 2201   LEUKOCYTESUR Negative 10/21/2018 1107     STUDIES: No results found.   ELIGIBLE FOR AVAILABLE RESEARCH PROTOCOL: no  ASSESSMENT: 73 y.o. [place] woman status post right breast upper inner quadrant biopsy 03/04/2015 for a clinical T1a N0, stage IA invasive ductal carcinoma, grade 1, estrogen receptor positive, progesterone receptor negative, with an MIB-1 of 3% and no HER-2 amplification  (1) anastrozole started neoadjuvantly 03/12/2017, held at the start of chemotherapy, resumed late November 2018  (2) genetics testing 0 03/2017 through the  common hereditary cancer panel offered by [facility] found no deleterious mutations in APC, ATM, AXIN2, BARD1, BMPR1A, BRCA1, BRCA2, BRIP1, CDH1, CDKN2A (p14ARF), CDKN2A (p16INK4a), CHEK2, CTNNA1, DICER1, EPCAM (Deletion/duplication testing only), GREM1 (promoter region deletion/duplication testing only), KIT, MEN1, MLH1, MSH2, MSH3, MSH6, MUTYH, NBN, NF1, NHTL1, PALB2, PDGFRA, PMS2, POLD1, POLE, PTEN, RAD50, RAD51C, RAD51D, SDHB, SDHC, SDHD, SMAD4, SMARCA4. STK11, TP53, TSC1, TSC2, and VHL.  The following genes were evaluated for sequence changes only: SDHA and HOXB13 c.251G>A variant only.    (3) right lumpectomy and sentinel lymph node biopsy 03/26/2017 showed a pT1c pN1, stage IB invasive ductal carcinoma, grade 1, with negative margins  (4) Mammaprint returned high risk, indicating need for  adjuvant chemotherapy  (5) chemotherapy consisting of Cytoxan and docetaxel given every 3 weeks 4 beginning on 04/28/2017  (a) changed to Doxorubicin and Cyclophosphamide beginning 05/19/2017, completing 3 cycles 06/30/2017  (b) dose reduced by 20% with the second and third CA treatments  (6) Adjuvant radiation completed 08/28/2017 Site/dose:    1) Right breast and axilla treated to 42.56 Gy with 16 fx of 2.66  2)  boost of 8 Gy with 4 fx of 2 Gy  (7) Anastrozole started in 06/2017  (a) DEXA scan 02/24/2018 showed a T score of -0.6 normal  (b) anastrozole stopped 04/09/2018--symptom trial  (c) tamoxifen started September 2019  (d) tamoxifen dose decreased to 10 mg daily as of September 2020   PLAN: Mckenzie Webb is now just over 2 years out from definitive surgery for her breast cancer with no evidence of disease recurrence.  This is very favorable.  She is struggling with the tamoxifen.  It is causing her severe hot flashes despite the venlafaxine.  I think we can increase the venlafaxine to 75 mg daily and hopefully that will help.  In addition we are dropping the tamoxifen dose to 10 mg daily.  She understands we do not have strong data for this particular dose of tamoxifen but on the other hand it is better than not taking tamoxifen at all and I am afraid that would be the alternative unless we can improve her symptomatology.  I have asked her to call us in a month to let us know how this combination is working.  If it is not sufficient we will add TTS 1 patches.  Otherwise she will see me again in 1 year.  She knows to call for any other issue that may develop before that visit.   , Virgie Dad, MD  05/04/19 2:23 PM Medical Oncology and Hematology [facility] [place], [place] [address] Tel. [phone]    Fax. [phone]  I, Jacqualyn Posey am acting as a Education administrator for Chauncey Cruel, MD.   I, Lurline Del MD, have reviewed the above documentation for accuracy and completeness, and I agree with the above.

## 2019-05-04 ENCOUNTER — Inpatient Hospital Stay: Payer: Medicare Other | Attending: Oncology

## 2019-05-04 ENCOUNTER — Inpatient Hospital Stay (HOSPITAL_BASED_OUTPATIENT_CLINIC_OR_DEPARTMENT_OTHER): Payer: Medicare Other | Admitting: Oncology

## 2019-05-04 ENCOUNTER — Other Ambulatory Visit: Payer: Self-pay

## 2019-05-04 VITALS — BP 146/65 | HR 64 | Temp 98.3°F | Resp 18 | Wt 161.8 lb

## 2019-05-04 DIAGNOSIS — E039 Hypothyroidism, unspecified: Secondary | ICD-10-CM | POA: Diagnosis not present

## 2019-05-04 DIAGNOSIS — C50211 Malignant neoplasm of upper-inner quadrant of right female breast: Secondary | ICD-10-CM

## 2019-05-04 DIAGNOSIS — R232 Flushing: Secondary | ICD-10-CM | POA: Diagnosis not present

## 2019-05-04 DIAGNOSIS — Z836 Family history of other diseases of the respiratory system: Secondary | ICD-10-CM | POA: Insufficient documentation

## 2019-05-04 DIAGNOSIS — K219 Gastro-esophageal reflux disease without esophagitis: Secondary | ICD-10-CM | POA: Diagnosis not present

## 2019-05-04 DIAGNOSIS — Z9221 Personal history of antineoplastic chemotherapy: Secondary | ICD-10-CM | POA: Diagnosis not present

## 2019-05-04 DIAGNOSIS — C50011 Malignant neoplasm of nipple and areola, right female breast: Secondary | ICD-10-CM | POA: Diagnosis not present

## 2019-05-04 DIAGNOSIS — Z800 Family history of malignant neoplasm of digestive organs: Secondary | ICD-10-CM | POA: Insufficient documentation

## 2019-05-04 DIAGNOSIS — Z801 Family history of malignant neoplasm of trachea, bronchus and lung: Secondary | ICD-10-CM | POA: Diagnosis not present

## 2019-05-04 DIAGNOSIS — Z823 Family history of stroke: Secondary | ICD-10-CM | POA: Diagnosis not present

## 2019-05-04 DIAGNOSIS — Z833 Family history of diabetes mellitus: Secondary | ICD-10-CM | POA: Insufficient documentation

## 2019-05-04 DIAGNOSIS — Z881 Allergy status to other antibiotic agents status: Secondary | ICD-10-CM | POA: Diagnosis not present

## 2019-05-04 DIAGNOSIS — Z79810 Long term (current) use of selective estrogen receptor modulators (SERMs): Secondary | ICD-10-CM | POA: Diagnosis not present

## 2019-05-04 DIAGNOSIS — Z885 Allergy status to narcotic agent status: Secondary | ICD-10-CM | POA: Diagnosis not present

## 2019-05-04 DIAGNOSIS — Z888 Allergy status to other drugs, medicaments and biological substances status: Secondary | ICD-10-CM | POA: Diagnosis not present

## 2019-05-04 DIAGNOSIS — Z8043 Family history of malignant neoplasm of testis: Secondary | ICD-10-CM | POA: Diagnosis not present

## 2019-05-04 DIAGNOSIS — Z880 Allergy status to penicillin: Secondary | ICD-10-CM | POA: Diagnosis not present

## 2019-05-04 DIAGNOSIS — Z79899 Other long term (current) drug therapy: Secondary | ICD-10-CM | POA: Insufficient documentation

## 2019-05-04 DIAGNOSIS — Z170 Estrogen receptor positive status [ER+]: Secondary | ICD-10-CM | POA: Insufficient documentation

## 2019-05-04 DIAGNOSIS — T451X5A Adverse effect of antineoplastic and immunosuppressive drugs, initial encounter: Secondary | ICD-10-CM

## 2019-05-04 DIAGNOSIS — M1990 Unspecified osteoarthritis, unspecified site: Secondary | ICD-10-CM | POA: Diagnosis not present

## 2019-05-04 DIAGNOSIS — Z923 Personal history of irradiation: Secondary | ICD-10-CM | POA: Diagnosis not present

## 2019-05-04 DIAGNOSIS — Z8379 Family history of other diseases of the digestive system: Secondary | ICD-10-CM | POA: Diagnosis not present

## 2019-05-04 DIAGNOSIS — Z8249 Family history of ischemic heart disease and other diseases of the circulatory system: Secondary | ICD-10-CM | POA: Insufficient documentation

## 2019-05-04 DIAGNOSIS — Z882 Allergy status to sulfonamides status: Secondary | ICD-10-CM | POA: Insufficient documentation

## 2019-05-04 DIAGNOSIS — Z8349 Family history of other endocrine, nutritional and metabolic diseases: Secondary | ICD-10-CM | POA: Diagnosis not present

## 2019-05-04 DIAGNOSIS — I427 Cardiomyopathy due to drug and external agent: Secondary | ICD-10-CM

## 2019-05-04 LAB — CBC WITH DIFFERENTIAL/PLATELET
Abs Immature Granulocytes: 0 10*3/uL (ref 0.00–0.07)
Basophils Absolute: 0 10*3/uL (ref 0.0–0.1)
Basophils Relative: 1 %
Eosinophils Absolute: 0.1 10*3/uL (ref 0.0–0.5)
Eosinophils Relative: 1 %
HCT: 38.9 % (ref 36.0–46.0)
Hemoglobin: 12.9 g/dL (ref 12.0–15.0)
Immature Granulocytes: 0 %
Lymphocytes Relative: 38 %
Lymphs Abs: 1.5 10*3/uL (ref 0.7–4.0)
MCH: 32.4 pg (ref 26.0–34.0)
MCHC: 33.2 g/dL (ref 30.0–36.0)
MCV: 97.7 fL (ref 80.0–100.0)
Monocytes Absolute: 0.4 10*3/uL (ref 0.1–1.0)
Monocytes Relative: 10 %
Neutro Abs: 2 10*3/uL (ref 1.7–7.7)
Neutrophils Relative %: 50 %
Platelets: 131 10*3/uL — ABNORMAL LOW (ref 150–400)
RBC: 3.98 MIL/uL (ref 3.87–5.11)
RDW: 12 % (ref 11.5–15.5)
WBC: 3.9 10*3/uL — ABNORMAL LOW (ref 4.0–10.5)
nRBC: 0 % (ref 0.0–0.2)

## 2019-05-04 LAB — COMPREHENSIVE METABOLIC PANEL
ALT: 17 U/L (ref 0–44)
AST: 22 U/L (ref 15–41)
Albumin: 3.7 g/dL (ref 3.5–5.0)
Alkaline Phosphatase: 56 U/L (ref 38–126)
Anion gap: 7 (ref 5–15)
BUN: 11 mg/dL (ref 8–23)
CO2: 30 mmol/L (ref 22–32)
Calcium: 9 mg/dL (ref 8.9–10.3)
Chloride: 106 mmol/L (ref 98–111)
Creatinine, Ser: 0.82 mg/dL (ref 0.44–1.00)
GFR calc Af Amer: >60 mL/min (ref >60)
GFR calc non Af Amer: >60 mL/min (ref >60)
Glucose, Bld: 107 mg/dL — ABNORMAL HIGH (ref 70–99)
Potassium: 4 mmol/L (ref 3.5–5.1)
Sodium: 143 mmol/L (ref 135–145)
Total Bilirubin: 0.3 mg/dL (ref 0.3–1.2)
Total Protein: 6.7 g/dL (ref 6.5–8.1)

## 2019-05-04 MED ORDER — TAMOXIFEN CITRATE 10 MG PO TABS: 10.0000 mg | ORAL_TABLET | Freq: Every day | ORAL | 4 refills | Status: DC

## 2019-05-04 MED ORDER — VENLAFAXINE HCL ER 75 MG PO CP24: 75.0000 mg | ORAL_CAPSULE | Freq: Every day | ORAL | 4 refills | Status: DC

## 2019-05-05 ENCOUNTER — Telehealth: Payer: Self-pay | Admitting: Oncology

## 2019-05-05 ENCOUNTER — Encounter: Payer: Self-pay | Admitting: Family Medicine

## 2019-05-05 NOTE — Telephone Encounter (Signed)
I left a message regarding schedule  

## 2019-05-06 DIAGNOSIS — M4726 Other spondylosis with radiculopathy, lumbar region: Secondary | ICD-10-CM | POA: Diagnosis not present

## 2019-05-06 DIAGNOSIS — M5136 Other intervertebral disc degeneration, lumbar region: Secondary | ICD-10-CM | POA: Diagnosis not present

## 2019-05-06 DIAGNOSIS — M48061 Spinal stenosis, lumbar region without neurogenic claudication: Secondary | ICD-10-CM | POA: Diagnosis not present

## 2019-06-02 DIAGNOSIS — D225 Melanocytic nevi of trunk: Secondary | ICD-10-CM | POA: Diagnosis not present

## 2019-06-02 DIAGNOSIS — B078 Other viral warts: Secondary | ICD-10-CM | POA: Diagnosis not present

## 2019-06-02 DIAGNOSIS — D692 Other nonthrombocytopenic purpura: Secondary | ICD-10-CM | POA: Diagnosis not present

## 2019-06-02 DIAGNOSIS — L821 Other seborrheic keratosis: Secondary | ICD-10-CM | POA: Diagnosis not present

## 2019-06-02 DIAGNOSIS — L400 Psoriasis vulgaris: Secondary | ICD-10-CM | POA: Diagnosis not present

## 2019-06-18 ENCOUNTER — Other Ambulatory Visit: Payer: Self-pay

## 2019-06-18 ENCOUNTER — Other Ambulatory Visit (INDEPENDENT_AMBULATORY_CARE_PROVIDER_SITE_OTHER): Payer: Medicare Other

## 2019-06-18 DIAGNOSIS — Z23 Encounter for immunization: Secondary | ICD-10-CM

## 2019-07-13 IMAGING — NM NM BONE WHOLE BODY
2 series · 2 of 2 positions shown · non-contrast
Comparison: None

Radiographic correlation: None recent

CLINICAL DATA: Invasive RIGHT breast cancer, question progression

EXAM:
NUCLEAR MEDICINE WHOLE BODY BONE SCAN
TECHNIQUE: Whole body anterior and posterior images were obtained approximately
3 hours after intravenous injection of radiopharmaceutical.
RADIOPHARMACEUTICALS:  21 mCi Bechnetium-EEm MDP IV

[Series 1: whole body · 2.66mm/px · 1 of 1 slices shown (1 of 2)]
[im 1/1]
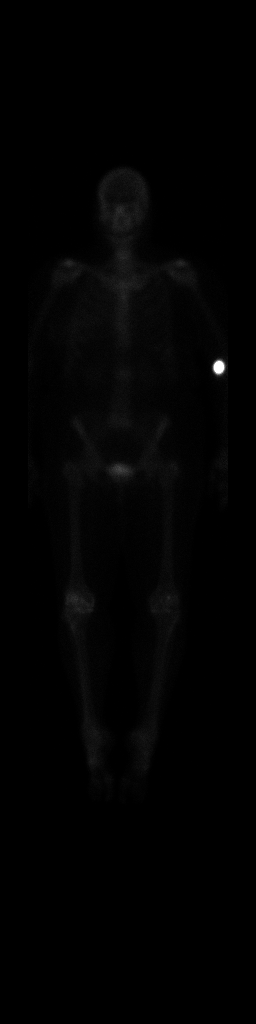

[Series 1: whole body · 2.66mm/px · 1 of 1 slices shown (2 of 2)]
[im 1/1]
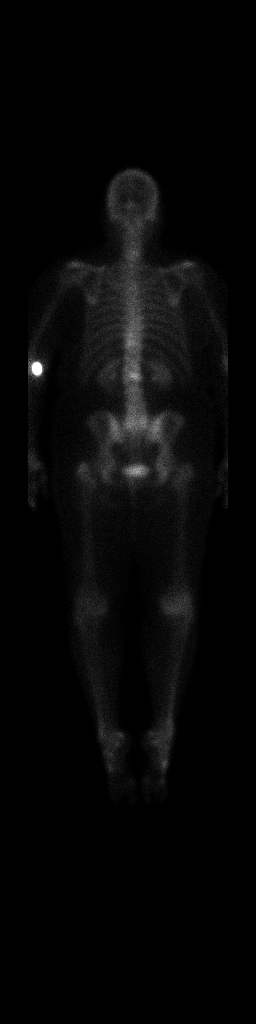

[2 of 2 positions shown; findings below may reference images not displayed]

FINDINGS: Uptake at the shoulders and knees typically degenerative.

Foci of abnormal increased tracer localization in the lower thoracic
spine at approximately T11 and in the upper lumbar spine at
approximately L2, concerning for osseous metastases.

No additional sites of worrisome osseous tracer accumulation are
identified.

Small amount tracer extravasation at the LEFT antecubital fossa
injection site.

Minimal nonspecific tracer localization in the RIGHT breast.

Expected urinary tract and soft tissue distribution otherwise seen.
IMPRESSION: Abnormal uptake within the spine at approximately T11 and L2,
concerning for osseous metastases.

## 2019-07-25 ENCOUNTER — Other Ambulatory Visit: Payer: Self-pay | Admitting: Family Medicine

## 2019-07-25 DIAGNOSIS — L409 Psoriasis, unspecified: Secondary | ICD-10-CM

## 2019-07-26 NOTE — Telephone Encounter (Signed)
Is this okay to refill? 

## 2019-08-07 ENCOUNTER — Other Ambulatory Visit: Payer: Self-pay | Admitting: Family Medicine

## 2019-08-07 DIAGNOSIS — E039 Hypothyroidism, unspecified: Secondary | ICD-10-CM

## 2019-08-20 HISTORY — PX: CATARACT EXTRACTION: SUR2

## 2019-09-02 NOTE — Progress Notes (Signed)
Chief Complaint  Patient presents with  . Medicare Wellness    fasting AWV, no pap. No new concerns.     Mckenzie Webb is a 74 y.o. female who presents for annual wellness visit and follow-up on chronic medical conditions.  She has the following concerns:  Hypercholesterolemia:  She was started on Crestor in June, after failing OTC red yeast rice.  Repeat lipids were much better.  She is tolerating this every other day--she had myalgias, "weakness" in her legs when it was taken daily, despite using coenzyme Q10.  She is tolerating this dose (qod) without side effects. Due for recheck lipids today. She continues to limit bacon, eggs, instead eating Cheerios with 2% milk.  Red meat 2x/week. (she has tried lipitor in the past, and she "felt like she couldn't move", achey in her legs)  Lab Results  Component Value Date   CHOL 165 04/07/2019   HDL 58 04/07/2019   LDLCALC 79 04/07/2019   TRIG 140 04/07/2019   CHOLHDL 2.8 04/07/2019   Hypertension:  BP's have been running low 120's-130/68-72.  She reports her monitor was verified as accurate in the past.  She was started on HCTZ 12.67m in 08/2017. She was taken off herlosartan HCTwhen she got very sick with chemo, was dehydrated. BP's remained elevated, and she was started back on HCTZ only. She had reported that her cough improved after the losartan HCT was stopped, so we avoided the ARB component. She eats bananas in her diet. She denies muscle cramps or side effects. Denies headaches, dizziness, chest pain, shortness of breath or edema. She has been having some headaches since effexor dose was increased (see below). Lab Results  Component Value Date   K 4.0 05/04/2019    Breast Cancer: She was diagnosed in 02/2017 withinvasive ductal carcinoma, grade 1, estrogen receptor 100% positive, and is s/pright lumpectomy and sentinel lymph node biopsy in 03/2017.Mammaprint returned high risk, so she underwent adjuvant chemotherapy. She  completed radiation with Dr. SIsidore Moos She started on Anastrozole in 06/2017, switched to tamoxifen in 04/2018 due to excessive fatigue.  Fatigue improved (not completely).  She has hot flashes, and was started on Effexor 37.584mfor these. This hadn't helped much, so at her last visit with Dr. MaJana Hakimin 04/2019, her tamoxifen dose was decreased to 1064mnd Effexor dose was increased to 6m63mBoth were done at the same time. Her hot flashes have improved, but she is having some headaches (in the mornings), and wonders if it is from the Effexor.  She takes it at the same time daily (after breakfast) and no missed doses.  Hypothyroidism: She reports compliance with her medications, on 6mc70mShe denies any changes inhair/skin/nails, bowels, moods.She is "dry head to toe", but this is no different for her. Psoriasis has been flaring (see below). Weight loss over the last year--not particularly intentional, but eating less overall (smaller lunches).  Energy level has improved. She is due for recheck today. Lab Results  Component Value Date   TSH 3.090 07/30/2018   Psoriasis:  Her psoriasis has been flaring over the last 9 months. She uses clobetasol prn, needing much more frequently.  Requesting refill today. Skin cleared up when she had the epidural steroid injection, but recurred.  She used to help control her psoriasis by using tanning bed, but none since she was diagnosed with cancer 2018. She sees dermatologist yearly for skin checks--at last check, her skin was well controlled.  GERD--She has been on daily  Omeprazole 65m.  She has had recurrence of reflux symptoms in the past on OTC dose of Nexium, and when not taking any PPI. She has been doing well on omeprazole, with no missed doses, and no symptoms.Denies any dysphagia.  H/o trochanteric bursitis: Hips haven't bothered her in a while. Last injections were 01/2017, bilateral.  She continues to have low back pain and is under the care of  Dr. NSherwood Gambler(no records received). She has been getting epidural injections, the last of which was in 04/2019. Injections are helpful. Pain is coming back, ready for another injection. Has pain with prolonged standing. No longer radiates into the back of her legs. Pain is mostly at the base of her spine (and near sacrum), sometimes radiates to the left buttock/hip.  IFG: Tries to limit sweets, carbs. 2 Oreo cookies with her coffee sometimes.Only an occasional sweet tea. Drinks diet soda. Lab Results  Component Value Date   HGBA1C 5.5 09/03/2018     Immunization History  Administered Date(s) Administered  . Fluad Quad(high Dose 65+) 06/18/2019  . Influenza Split 06/20/2011, 06/19/2012, 05/25/2014  . Influenza Whole 05/19/2000, 05/19/2001  . Influenza, High Dose Seasonal PF 05/23/2016, 06/04/2018  . Influenza,inj,Quad PF,6+ Mos 08/01/2017  . Influenza-Unspecified 05/19/2014, 05/24/2015  . Pneumococcal Conjugate-13 07/26/2013  . Pneumococcal Polysaccharide-23 06/16/2000, 07/27/2014  . Td 04/13/2001  . Tdap 12/31/2010  . Zoster 07/26/2013   Last Pap smear: 04/2019 Last mammogram:02/2019 Last colonoscopy: 12/2017 Dr. MCollene Mares told 119yr/u (if any)  Last DEXA: 02/2018, normal Dentist: every 6 months  Ophtho: yearly Exercise: Staying active at home--leg lifts, arm exercises, since she isn't walking as much due to the cold weather. No weight-bearing exercise, but has handweights. Vitamin D level was normal in 2012   Other doctors caring for patient include: Ophtho:Dr. CoJorja LoaYN: Dr. WeStann MainlandI: Dr. MaCollene Maresentist:Dr. Barrett Urologist: Dr. OtKarsten Rortho: Dr. WaNoemi Chapelurgeon: Dr. ToMarlou Starkseurosurgeon: Dr. NuSherwood Gamblerncologist: Dr. MaJana Hakimadiation oncology: Dr. SqIsidore Mooserm: Dr. JoMartinique Depression screen: negative Fall screen: none Functional Status screen: notable for some leakage of urine (very mild), very minimal short-term memory issues Mini-Cog score of 5/5 Please see  questionnaires in epic.  End of Life Discussion: Patient does not havea living will and medical power of attorney. She was given forms last year, filled them out, but hasn't gotten them notarized yet.   PMH, PSH, SH and FH reviewed and updated  ROS: The patient denies anorexia, fever, vision changes, decreased hearing, ear pain, sore throat, breast concerns, chest pain, palpitations, dizziness, syncope, dyspnea on exertion, swelling, nausea, vomiting, melena, hematochezia, hematuria, incontinence, dysuria, vaginal bleeding, discharge, odor or itch, genital lesions, numbness, tingling, weakness, tremor, suspicious skin lesions, depression, anxiety, abnormal bleeding/bruising, or enlarged lymph nodes. Hemorroids periodically flare (internal), no bleeding.  Had banding in the past, considering repeat treatment. Hasn't tried OTC meds yet for the discomfort she has. Low back pain per HPI.  No numbness, tingling, weakness.  Hot flashes have improved per HPI Fatigue has improved (with med changes) Weight loss noted Psoriasis has been flaring. Headaches since Effexor dose incrdased.   PHYSICAL EXAM:  BP 130/70   Pulse 72   Temp (!) 97.1 F (36.2 C) (Other (Comment))   Ht 5' 4"  (1.626 m)   Wt 161 lb 3.2 oz (73.1 kg)   BMI 27.67 kg/m   Wt Readings from Last 3 Encounters:  05/04/19 161 lb 12.8 oz (73.4 kg)  01/25/19 169 lb (76.7 kg)  10/21/18 174 lb 3.2 oz (  79 kg)   General Appearance:  Alert, cooperative, no distress, appears stated age.  Head:  Normocephalic, without obvious abnormality, atraumatic   Eyes:  PERRL, conjunctiva/corneas clear, EOM's intact, fundi benign   Ears:  Normal TM's and external ear canals   Nose:  Not examined, wearing mask due to COVID-19 pandemic   Throat:  Not examined, wearing mask due to COVID-19 pandemic  Neck:  Supple, no lymphadenopathy; thyroid: no enlargement/ tenderness/nodules; no carotid bruit or JVD   Back:  Spine  nontender, no curvature, ROM normal, no CVA tenderness.   Lungs:  Clear to auscultation bilaterally without wheezes, rales or ronchi; respirations unlabored   Chest Wall:  No tenderness or deformity.   Heart:  Regular rate and rhythm, S1 and S2 normal, no murmur, rub or gallop   Breast Exam:  Deferred to GYN   Abdomen:  Soft, nondistended, normoactive bowel sounds, no masses, no hepatosplenomegaly.  Genitalia:  Deferred to GYN      Extremities:  No clubbing, cyanosis or edema. Nontender over bilateral trochanteric bursae  Pulses:  2+ and symmetric all extremities   Skin:  Normal turgor.  She has scattered small erythematous scaly plaques across her lateral hips, low back, and a few small ones scattered throughout on extremities.  Lymph nodes:  Cervical, supraclavicular, and axillary nodes normal   Neurologic:  CNII-XII intact, normal strength, sensation and gait; reflexes 2+ and symmetric throughout    Psych: Normal mood, affect, hygiene and grooming    ASSESSMENT/PLAN:  Medicare annual wellness visit, subsequent  Hypothyroidism, unspecified type - Due for recheck.  +wt loss - Plan: TSH  Pure hypercholesterolemia - tolerating crestor 3x/week, due for recheck - Plan: Lipid panel  Essential hypertension, benign - well controlled - Plan: Comprehensive metabolic panel  Medication monitoring encounter - Plan: Comprehensive metabolic panel, Lipid panel, Hemoglobin A1c, TSH  IFG (impaired fasting glucose) - counseled re: diet, exercise - Plan: Hemoglobin A1c  Psoriasis - flaring; rec f/u with derm to discuss other treatment options.  Cont clobetasol prn - Plan: clobetasol cream (TEMOVATE) 0.05 %  Malignant neoplasm of upper-inner quadrant of right breast in female, estrogen receptor positive ([place]) - on Tamoxifen (at reduced dose due to hot flashes/tolerability issues) per onc  Low back pain, unspecified back pain laterality,  unspecified chronicity, unspecified whether sciatica present - plans to f/u for another epidural soon, pain recurring  Gastroesophageal reflux disease - well controlled.  Given wt loss, rec she consider re-trying lower dose, for lowest effective dose - Plan: omeprazole (PRILOSEC) 40 MG capsule  Hot flashes due to tamoxifen - improved since dose was lowered.  Getting HA from Effexor, to discuss poss going back to 37.32m dose   c-met, lipid, TSH, A1c (with labs) CBC done by onc, noted to have slightly low plt (131) and WBC 3.9   Discussed monthly self breast exams and yearly mammograms; at least 30 minutes of aerobic activity at least 5 days/week and weight-bearing exercise 2x/week; proper sunscreen use reviewed; healthy diet, including goals of calcium and vitamin D intake and alcohol recommendations (less than or equal to 1 drink/day) reviewed; regular seatbelt use; changing batteries in smoke detectors. Immunization recommendations discussed--continue high dose flu shots yearly. COVID vaccine recommended, risks/SE reviewed.  Shingrix is recommended; risks/SE reviewed, to get from pharmacy.Colonoscopy recommendations reviewed, UTD  MOST form reviewed and updated. Full Code, Full care. Reminded to get theLiving Will and healthcare POA notarized and to get [place]..  Chronic PPI use. Risks/benefits of treatment reviewed  in detail. Continue current vitamins.  F/u 6 mos for med check   Medicare Attestation I have personally reviewed: The patient's medical and social history Their use of alcohol, tobacco or illicit drugs Their current medications and supplements The patient's functional ability including ADLs,fall risks, home safety risks, cognitive, and hearing and visual impairment Diet and physical activities Evidence for depression or mood disorders  The patient's weight, height, BMI have been recorded in the chart.  I have made referrals, counseling, and provided education to the  patient based on review of the above and I have provided the patient with a written personalized care plan for preventive services.

## 2019-09-05 NOTE — Patient Instructions (Addendum)
HEALTH MAINTENANCE RECOMMENDATIONS:  It is recommended that you get at least 30 minutes of aerobic exercise at least 5 days/week (for weight loss, you may need as much as 60-90 minutes). This can be any activity that gets your heart rate up. This can be divided in 10-15 minute intervals if needed, but try and build up your endurance at least once a week.  Weight bearing exercise is also recommended twice weekly.  Eat a healthy diet with lots of vegetables, fruits and fiber.  "Colorful" foods have a lot of vitamins (ie green vegetables, tomatoes, red peppers, etc).  Limit sweet tea, regular sodas and alcoholic beverages, all of which has a lot of calories and sugar.  Up to 1 alcoholic drink daily may be beneficial for women (unless trying to lose weight, watch sugars).  Drink a lot of water.  Calcium recommendations are 1200-1500 mg daily (1500 mg for postmenopausal women or women without ovaries), and vitamin D 1000 IU daily.  This should be obtained from diet and/or supplements (vitamins), and calcium should not be taken all at once, but in divided doses.  Monthly self breast exams and yearly mammograms for women over the age of 48 is recommended.  Sunscreen of at least SPF 30 should be used on all sun-exposed parts of the skin when outside between the hours of 10 am and 4 pm (not just when at beach or pool, but even with exercise, golf, tennis, and yard work!)  Use a sunscreen that says "broad spectrum" so it covers both UVA and UVB rays, and make sure to reapply every 1-2 hours.  Remember to change the batteries in your smoke detectors when changing your clock times in the spring and fall. Carbon monoxide detectors are recommended for your home.  Use your seat belt every time you are in a car, and please drive safely and not be distracted with cell phones and texting while driving.   Mckenzie Webb , Thank you for taking time to come for your Medicare Wellness Visit. I appreciate your ongoing  commitment to your health goals. Please review the following plan we discussed and let me know if I can assist you in the future.   This is a list of the screening recommended for you and due dates:  Health Maintenance  Topic Date Due  . Tetanus Vaccine  12/30/2020  . Mammogram  02/26/2020  . Colon Cancer Screening  12/25/2027  . Flu Shot  Completed  . DEXA scan (bone density measurement)  Completed  .  Hepatitis C: One time screening is recommended by Center for Disease Control  (CDC) for  adults born from [birth year] through 1963.   Completed  . Pneumonia vaccines  Completed   COVID vaccine is recommended when available.  You should be able to get your name on a waiting list through [facility] if you are unable to get an appointment.  COVID-19 Vaccine Information can be found at: ShippingScam.co.uk For questions related to vaccine distribution or appointments, please email vaccine@[email or web address] .[email or web address] or call [phone].   You can also check [email or web address] and with the health department.  I recommend getting the new shingles vaccine (Shingrix). You will need to get this from the pharmacy rather than our office, as it is covered by Medicare part D.  It is a series of 2 injections, spaced 2 months apart.  Please get [place] a copy of your notarized living will and healthcare power of attorney at your convenience (once notarized).  Consider trying over-the-counter treatments for  your internal hemorrhoids, as needed, such as Anusol HC SUPPOSITORIES.  Follow up with Dr. Martinique for your psoriasis.

## 2019-09-06 ENCOUNTER — Encounter: Payer: Self-pay | Admitting: Family Medicine

## 2019-09-06 ENCOUNTER — Other Ambulatory Visit: Payer: Self-pay

## 2019-09-06 ENCOUNTER — Ambulatory Visit (INDEPENDENT_AMBULATORY_CARE_PROVIDER_SITE_OTHER): Payer: Medicare Other | Admitting: Family Medicine

## 2019-09-06 VITALS — BP 130/70 | HR 72 | Temp 97.1°F | Ht 64.0 in | Wt 161.2 lb

## 2019-09-06 DIAGNOSIS — M545 Low back pain, unspecified: Secondary | ICD-10-CM

## 2019-09-06 DIAGNOSIS — C50211 Malignant neoplasm of upper-inner quadrant of right female breast: Secondary | ICD-10-CM

## 2019-09-06 DIAGNOSIS — K219 Gastro-esophageal reflux disease without esophagitis: Secondary | ICD-10-CM

## 2019-09-06 DIAGNOSIS — R7301 Impaired fasting glucose: Secondary | ICD-10-CM | POA: Diagnosis not present

## 2019-09-06 DIAGNOSIS — R232 Flushing: Secondary | ICD-10-CM | POA: Diagnosis not present

## 2019-09-06 DIAGNOSIS — E039 Hypothyroidism, unspecified: Secondary | ICD-10-CM | POA: Diagnosis not present

## 2019-09-06 DIAGNOSIS — L409 Psoriasis, unspecified: Secondary | ICD-10-CM | POA: Diagnosis not present

## 2019-09-06 DIAGNOSIS — I10 Essential (primary) hypertension: Secondary | ICD-10-CM

## 2019-09-06 DIAGNOSIS — Z5181 Encounter for therapeutic drug level monitoring: Secondary | ICD-10-CM | POA: Diagnosis not present

## 2019-09-06 DIAGNOSIS — Z0000 Encounter for general adult medical examination without abnormal findings: Secondary | ICD-10-CM | POA: Diagnosis not present

## 2019-09-06 DIAGNOSIS — T451X5A Adverse effect of antineoplastic and immunosuppressive drugs, initial encounter: Secondary | ICD-10-CM

## 2019-09-06 DIAGNOSIS — E7800 Pure hypercholesterolemia, unspecified: Secondary | ICD-10-CM

## 2019-09-06 DIAGNOSIS — Z170 Estrogen receptor positive status [ER+]: Secondary | ICD-10-CM | POA: Diagnosis not present

## 2019-09-06 MED ORDER — CLOBETASOL PROPIONATE 0.05 % EX CREA: TOPICAL_CREAM | Freq: Two times a day (BID) | CUTANEOUS | 0 refills | Status: DC | PRN

## 2019-09-06 MED ORDER — OMEPRAZOLE 40 MG PO CPDR: 40.0000 mg | DELAYED_RELEASE_CAPSULE | Freq: Every day | ORAL | 3 refills | Status: DC

## 2019-09-07 LAB — COMPREHENSIVE METABOLIC PANEL
ALT: 20 IU/L (ref 0–32)
AST: 25 IU/L (ref 0–40)
Albumin/Globulin Ratio: 1.4 (ref 1.2–2.2)
Albumin: 3.8 g/dL (ref 3.7–4.7)
Alkaline Phosphatase: 57 IU/L (ref 39–117)
BUN/Creatinine Ratio: 17 (ref 12–28)
BUN: 13 mg/dL (ref 8–27)
Bilirubin Total: 0.4 mg/dL (ref 0.0–1.2)
CO2: 26 mmol/L (ref 20–29)
Calcium: 9.3 mg/dL (ref 8.7–10.3)
Chloride: 104 mmol/L (ref 96–106)
Creatinine, Ser: 0.77 mg/dL (ref 0.57–1.00)
GFR calc Af Amer: 90 mL/min/{1.73_m2} (ref >59)
GFR calc non Af Amer: 78 mL/min/{1.73_m2} (ref >59)
Globulin, Total: 2.7 g/dL (ref 1.5–4.5)
Glucose: 94 mg/dL (ref 65–99)
Potassium: 3.7 mmol/L (ref 3.5–5.2)
Sodium: 143 mmol/L (ref 134–144)
Total Protein: 6.5 g/dL (ref 6.0–8.5)

## 2019-09-07 LAB — LIPID PANEL
Chol/HDL Ratio: 2.6 ratio (ref 0.0–4.4)
Cholesterol, Total: 176 mg/dL (ref 100–199)
HDL: 69 mg/dL (ref >39)
LDL Chol Calc (NIH): 88 mg/dL (ref 0–99)
Triglycerides: 106 mg/dL (ref 0–149)
VLDL Cholesterol Cal: 19 mg/dL (ref 5–40)

## 2019-09-07 LAB — TSH: TSH: 3.17 u[IU]/mL (ref 0.450–4.500)

## 2019-09-07 LAB — HEMOGLOBIN A1C
Est. average glucose Bld gHb Est-mCnc: 105 mg/dL
Hgb A1c MFr Bld: 5.3 % (ref 4.8–5.6)

## 2019-09-10 ENCOUNTER — Other Ambulatory Visit: Payer: Self-pay | Admitting: Family Medicine

## 2019-09-10 DIAGNOSIS — K219 Gastro-esophageal reflux disease without esophagitis: Secondary | ICD-10-CM

## 2019-09-24 DIAGNOSIS — Z23 Encounter for immunization: Secondary | ICD-10-CM | POA: Diagnosis not present

## 2019-10-05 DIAGNOSIS — M48062 Spinal stenosis, lumbar region with neurogenic claudication: Secondary | ICD-10-CM | POA: Diagnosis not present

## 2019-10-05 DIAGNOSIS — Z6827 Body mass index (BMI) 27.0-27.9, adult: Secondary | ICD-10-CM | POA: Diagnosis not present

## 2019-10-05 DIAGNOSIS — M5136 Other intervertebral disc degeneration, lumbar region: Secondary | ICD-10-CM | POA: Diagnosis not present

## 2019-10-05 DIAGNOSIS — R030 Elevated blood-pressure reading, without diagnosis of hypertension: Secondary | ICD-10-CM | POA: Diagnosis not present

## 2019-10-05 DIAGNOSIS — M47816 Spondylosis without myelopathy or radiculopathy, lumbar region: Secondary | ICD-10-CM | POA: Diagnosis not present

## 2019-10-05 DIAGNOSIS — M5442 Lumbago with sciatica, left side: Secondary | ICD-10-CM | POA: Diagnosis not present

## 2019-10-14 DIAGNOSIS — M4726 Other spondylosis with radiculopathy, lumbar region: Secondary | ICD-10-CM | POA: Diagnosis not present

## 2019-10-14 DIAGNOSIS — M5136 Other intervertebral disc degeneration, lumbar region: Secondary | ICD-10-CM | POA: Diagnosis not present

## 2019-10-14 DIAGNOSIS — M48061 Spinal stenosis, lumbar region without neurogenic claudication: Secondary | ICD-10-CM | POA: Diagnosis not present

## 2019-10-21 ENCOUNTER — Encounter: Payer: Self-pay | Admitting: Oncology

## 2019-10-23 DIAGNOSIS — Z23 Encounter for immunization: Secondary | ICD-10-CM | POA: Diagnosis not present

## 2019-10-25 ENCOUNTER — Other Ambulatory Visit: Payer: Self-pay

## 2019-10-25 MED ORDER — VENLAFAXINE HCL ER 37.5 MG PO CP24: 37.5000 mg | ORAL_CAPSULE | Freq: Every day | ORAL | 0 refills | Status: DC

## 2019-10-30 ENCOUNTER — Encounter: Payer: Self-pay | Admitting: Family Medicine

## 2019-11-01 ENCOUNTER — Other Ambulatory Visit: Payer: Self-pay | Admitting: *Deleted

## 2019-11-01 DIAGNOSIS — E7800 Pure hypercholesterolemia, unspecified: Secondary | ICD-10-CM

## 2019-11-01 DIAGNOSIS — I10 Essential (primary) hypertension: Secondary | ICD-10-CM

## 2019-11-01 MED ORDER — HYDROCHLOROTHIAZIDE 12.5 MG PO CAPS: ORAL_CAPSULE | ORAL | 0 refills | Status: DC

## 2019-11-01 MED ORDER — ROSUVASTATIN CALCIUM 10 MG PO TABS: 10.0000 mg | ORAL_TABLET | Freq: Every day | ORAL | 0 refills | Status: DC

## 2019-11-06 ENCOUNTER — Other Ambulatory Visit: Payer: Self-pay | Admitting: Family Medicine

## 2019-11-06 DIAGNOSIS — E039 Hypothyroidism, unspecified: Secondary | ICD-10-CM

## 2019-11-11 ENCOUNTER — Other Ambulatory Visit: Payer: Self-pay | Admitting: Oncology

## 2019-12-10 ENCOUNTER — Ambulatory Visit (INDEPENDENT_AMBULATORY_CARE_PROVIDER_SITE_OTHER): Payer: Medicare Other | Admitting: Medical

## 2019-12-10 ENCOUNTER — Other Ambulatory Visit: Payer: Self-pay

## 2019-12-10 VITALS — BP 126/78 | HR 68 | Temp 98.0°F | Ht 64.0 in | Wt 162.2 lb

## 2019-12-10 DIAGNOSIS — L409 Psoriasis, unspecified: Secondary | ICD-10-CM | POA: Diagnosis not present

## 2019-12-10 DIAGNOSIS — L255 Unspecified contact dermatitis due to plants, except food: Secondary | ICD-10-CM | POA: Diagnosis not present

## 2019-12-10 MED ORDER — METHYLPREDNISOLONE ACETATE 40 MG/ML IJ SUSP
40.0000 mg | Freq: Once | INTRAMUSCULAR | Status: AC
  Administered 2019-12-10: 40 mg via INTRAMUSCULAR

## 2019-12-10 MED ORDER — CLOBETASOL PROPIONATE 0.05 % EX CREA: TOPICAL_CREAM | Freq: Two times a day (BID) | CUTANEOUS | 0 refills | Status: DC | PRN

## 2019-12-10 NOTE — Progress Notes (Signed)
Subjective:  Mckenzie Webb is a 74 y.o. female who presents for Chief Complaint  Patient presents with  . Poison Oak    upper body and face as well      Here for rash.   Few days ago had been out in the yard trying to cut down a large bush. She was not wearing gloves. She was wearing long sleeve shirt. But pushed her sleeves up. Started getting itchy and irritated and by the next day had rash on her arms face and torso. She has had reaction to poison ivy in the past. She is worried about this getting worse. No fever. No other exposure.  No other aggravating or relieving factors.    No other c/o.  The following portions of the patient's history were reviewed and updated as appropriate: allergies, current medications, past family history, past medical history, past social history, past surgical history and problem list.  ROS Otherwise as in subjective above  Objective: BP 126/78   Pulse 68   Temp 98 F (36.7 C)   Ht 5' 4"  (1.626 m)   Wt 162 lb 3.2 oz (73.6 kg)   SpO2 97%   BMI 27.84 kg/m   General appearance: alert, no distress, well developed, well nourished Skin: She has linear urticarial and puffy roundish urticarial rash on right forearm, left cheek, right neck consistent with poison plant dermatitis   Assessment: Encounter Diagnosis  Name Primary?  . Psoriasis      Plan: We discussed findings and treatment recommendations. Use Benadryl oral twice daily next few days, she can use her cream as below that she normally uses for psoriasis flareup on the rash except for the face. Advised to use over-the-counter hydrocortisone on the face. 40 mg Depo-Medrol injection today IM to help with the reaction. Follow-up if not resolved within 3 to 4 days   Mckenzie Webb was seen today for poison oak.  Diagnoses and all orders for this visit:  Psoriasis Comments: flaring; rec f/u with derm to discuss other treatment options.  Cont clobetasol prn Orders: -     clobetasol cream  (TEMOVATE) 0.05 %; Apply topically 2 (two) times daily as needed. -     methylPREDNISolone acetate (DEPO-MEDROL) injection 40 mg    Follow up: As needed

## 2019-12-13 DIAGNOSIS — L255 Unspecified contact dermatitis due to plants, except food: Secondary | ICD-10-CM | POA: Insufficient documentation

## 2019-12-14 NOTE — Progress Notes (Signed)
Chief Complaint  Patient presents with  . Rash    cut a bush down last Tuesday and was diagnosed with plant dermatitis by Audelia Acton. Givev 23m depo medrol, taking benadryl at night and using OTC hydrocortisone cream as well as clobetasol cream-still itching and not resolving.     Patient presents to f/u on poison ivy.  She saw SAudelia Actonlast week.  She was given 448mDepo-Medrol injection, and told to use her rx steroid cream on extremities, OTC hydrocortisone on the face, and to use Benadryl.  She reports she didn't get any benefit from the steroids. It has continued to get worse. The one spot she had on the R forearm has improved, but she has had new spots develop recently. Also getting new spots on her thighs, L arm, and it is now on her bottom, under L breast. Facial swelling started 3 days ago.  Face is itchy, and her neck is also very itchy. She is taking 2 benadryl at night, and taking claritin in the mornings  PMH, PSH, SH reviewed   Outpatient Encounter Medications as of 12/15/2019  Medication Sig Note  . Calamine 8-8 % LOTN Apply 1 application topically as needed.   . clobetasol cream (TEMOVATE) 0.05 % Apply topically 2 (two) times daily as needed.   . Coenzyme Q10 (CO Q-10) 100 MG CAPS Take by mouth daily.   . diphenhydrAMINE (BENADRYL) 25 MG tablet Take 50 mg by mouth at bedtime.   . hydrochlorothiazide (MICROZIDE) 12.5 MG capsule TAKE 1 CAPSULE BY MOUTH EVERY DAY   . hydrocortisone cream 0.5 % Apply 1 application topically 2 (two) times daily. 12/15/2019: Only on face  . levothyroxine (SYNTHROID) 75 MCG tablet TAKE 1 TABLET BY MOUTH EVERY DAY   . loratadine (CLARITIN) 10 MG tablet Take 10 mg by mouth daily as needed for allergies.    . Multiple Vitamins-Minerals (ALIVE WOMENS 50+ PO) Take 1 tablet by mouth daily.    . Marland Kitchenmeprazole (PRILOSEC) 40 MG capsule TAKE 1 CAPSULE BY MOUTH EVERY DAY   . rosuvastatin (CRESTOR) 10 MG tablet Take 1 tablet (10 mg total) by mouth daily.   . tamoxifen  (NOLVADEX) 10 MG tablet Take 1 tablet (10 mg total) by mouth daily.   . Marland Kitchenenlafaxine XR (EFFEXOR-XR) 37.5 MG 24 hr capsule Take 1 capsule (37.5 mg total) by mouth daily with breakfast.   . nitrofurantoin, macrocrystal-monohydrate, (MACROBID) 100 MG capsule Take 100 mg by mouth 2 (two) times daily. 09/03/2018: Took regularly once daily for a month, stopped a week ago (used to only use after intercourse).   No facility-administered encounter medications on file as of 12/15/2019.   Allergies  Allergen Reactions  . Adhesive [Tape] Other (See Comments)    SKIN TEARS  . Ciprofloxacin Nausea Only  . Codeine Nausea Only  . Iodine Swelling  . Polysporin [Bacitracin-Polymyxin B] Swelling    OPHTHALMIC - SWELLING OF EYES  . Penicillins Rash       . Sulfa Antibiotics Itching   ROS: no fever, chills, URI symptoms, chest pain, shortness of breath.  +rash and facial swelling, itching, per HPI. No GI complaints or other concerns except as noted in HPI   PHYSICAL EXAM:  BP 128/72   Pulse 72   Temp 97.7 F (36.5 C) (Other (Comment))   Ht 5' 4"  (1.626 m)   Wt 162 lb (73.5 kg)   BMI 27.81 kg/m   Pleasant female, in no distress HEENT: conjunctiva and sclera are clear. She is wearing a  mask and there is clear erythema and swelling below her eyes, just above the mask. When mask was removed, it was clear that most of the swelling was in upper cheeks, but erythema extended to most of the cheek. It was slightly rough to the feel, though no specific papules or vesicles were noted on the cheek itself. Some papules/rash noted at lower jaw, mainly on left. Excoriations on her neck No cervical lymphadenopathy. Extremities: Linear vesicles at the right anterior forearm.  Some other scattered small vesicles and pink papules on the forearms. Inner thigh--large number of pink papules, not vesicular. At the buttocks, along the "kissing" areas of skin there are scattered papules. Rest of back and buttocks are  clear. No surrounding erythema, warmth or swelling. Under the left breast is a minimal area of erythema, no raised areas or papules.  Healing bruises/ecchmosis from the bushes on forearms R>L Neuro: alert and oriented, cranial nerves intact, normal gait   ASSESSMENT/PLAN:  Plant dermatitis - Risks/SE of oral steroids reviewed, and how to taper. Cont anithistamines, topical meds - Plan: predniSONE (DELTASONE) 10 MG tablet   Take the oral steroids as directed. If the rash on your buttocks doesn't improve (or worsens) with the steroids, then use a topical antifungal cream such as lotrimin or lamisil twice daily until resolved.  60/50/40/40/30/30/20/20/20/05/28/09/10 #35

## 2019-12-15 ENCOUNTER — Other Ambulatory Visit: Payer: Self-pay

## 2019-12-15 ENCOUNTER — Ambulatory Visit (INDEPENDENT_AMBULATORY_CARE_PROVIDER_SITE_OTHER): Payer: Medicare Other | Admitting: Family Medicine

## 2019-12-15 ENCOUNTER — Encounter: Payer: Self-pay | Admitting: Family Medicine

## 2019-12-15 VITALS — BP 128/72 | HR 72 | Temp 97.7°F | Ht 64.0 in | Wt 162.0 lb

## 2019-12-15 DIAGNOSIS — L255 Unspecified contact dermatitis due to plants, except food: Secondary | ICD-10-CM | POA: Diagnosis not present

## 2019-12-15 MED ORDER — PREDNISONE 10 MG PO TABS: ORAL_TABLET | ORAL | 0 refills | Status: DC

## 2019-12-15 NOTE — Patient Instructions (Signed)
Take 55m of prednisone today (6 tablets) Thursday--take 5 tablets with breakfast Friday and Saturday mornings--take 4 tablets Sunday and Monday--take 3 tablets Tues, Wed, Thurs--take 2 tablets Take the remaining 4 tablets 1 pill daily (Friday through Monday).  Continue the claritin daily, and benadryl at bedtime, if needed. Continue topical medications as needed   Poison Ivy Dermatitis Poison ivy dermatitis is inflammation of the skin that is caused by chemicals in the leaves of the poison ivy plant. The skin reaction often involves redness, swelling, blisters, and extreme itching. What are the causes? This condition is caused by a chemical (urushiol) found in the sap of the poison ivy plant. This chemical is sticky and can be easily spread to people, animals, and objects. You can get poison ivy dermatitis by:  Having direct contact with a poison ivy plant.  Touching animals, other people, or objects that have come in contact with poison ivy and have the chemical on them. What increases the risk? This condition is more likely to develop in people who:  Are outdoors often in [place]ed or [place].  Go outdoors without wearing protective clothing, such as closed shoes, long pants, and a long-sleeved shirt. What are the signs or symptoms? Symptoms of this condition include:  Redness of the skin.  Extreme itching.  A rash that often includes bumps and blisters. The rash usually appears 48 hours after exposure, if you have been exposed before. If this is the first time you have been exposed, the rash may not appear until a week after exposure.  Swelling. This may occur if the reaction is more severe. Symptoms usually last for 1-2 weeks. However, the first time you develop this condition, symptoms may last 3-4 weeks. How is this diagnosed? This condition may be diagnosed based on your symptoms and a physical exam. Your health care provider may also ask you about any recent outdoor  activity. How is this treated? Treatment for this condition will vary depending on how severe it is. Treatment may include:  Hydrocortisone cream or calamine lotion to relieve itching.  Oatmeal baths to soothe the skin.  Medicines, such as over-the-counter antihistamine tablets.  Oral steroid medicine, for more severe reactions. Follow these instructions at home: Medicines  Take or apply over-the-counter and prescription medicines only as told by your health care provider.  Use hydrocortisone cream or calamine lotion as needed to soothe the skin and relieve itching. General instructions  Do not scratch or rub your skin.  Apply a cold, wet cloth (cold compress) to the affected areas or take baths in cool water. This will help with itching. Avoid hot baths and showers.  Take oatmeal baths as needed. Use colloidal oatmeal. You can get this at your local pharmacy or grocery store. Follow the instructions on the packaging.  While you have the rash, wash clothes right after you wear them.  Keep all follow-up visits as told by your health care provider. This is important. How is this prevented?   Learn to identify the poison ivy plant and avoid contact with the plant. This plant can be recognized by the number of leaves. Generally, poison ivy has three leaves with flowering branches on a single stem. The leaves are typically glossy, and they have jagged edges that come to a point at the front.  If you have been exposed to poison ivy, thoroughly wash with soap and water right away. You have about 30 minutes to remove the plant resin before it will cause the rash.  Be sure to wash under your fingernails, because any plant resin there will continue to spread the rash.  When hiking or camping, wear clothes that will help you to avoid exposure on the skin. This includes long pants, a long-sleeved shirt, tall socks, and hiking boots. You can also apply preventive lotion to your skin to help  limit exposure.  If you suspect that your clothes or outdoor gear came in contact with poison ivy, rinse them off outside with a garden hose before you bring them inside your house.  When doing yard work or gardening, wear gloves, long sleeves, long pants, and boots. Wash your garden tools and gloves if they come in contact with poison ivy.  If you suspect that your pet has come into contact with poison ivy, wash him or her with pet shampoo and water. Make sure to wear gloves while washing your pet. Contact a health care provider if you have:  Open sores in the rash area.  More redness, swelling, or pain in the affected area.  Redness that spreads beyond the rash area.  Fluid, blood, or pus coming from the affected area.  A fever.  A rash over a large area of your body.  A rash on your eyes, mouth, or genitals.  A rash that does not improve after a few weeks. Get help right away if:  Your face swells or your eyes swell shut.  You have trouble breathing.  You have trouble swallowing. These symptoms may represent a serious problem that is an emergency. Do not wait to see if the symptoms will go away. Get medical help right away. Call your local emergency services (911 in the [place] Do not drive yourself to the hospital. Summary  Poison ivy dermatitis is inflammation of the skin that is caused by chemicals in the leaves of the poison ivy plant.  Symptoms of this condition include redness, itching, a rash, and swelling.  Do not scratch or rub your skin.  Take or apply over-the-counter and prescription medicines only as told by your health care provider. This information is not intended to replace advice given to you by your health care provider. Make sure you discuss any questions you have with your health care provider. Document Revised: 11/27/2018 Document Reviewed: 07/31/2018 Elsevier Patient Education  2020 [employer].

## 2019-12-27 DIAGNOSIS — D3131 Benign neoplasm of right choroid: Secondary | ICD-10-CM | POA: Diagnosis not present

## 2020-01-11 ENCOUNTER — Other Ambulatory Visit: Payer: Self-pay | Admitting: Oncology

## 2020-01-19 ENCOUNTER — Other Ambulatory Visit: Payer: Self-pay | Admitting: Oncology

## 2020-01-19 DIAGNOSIS — Z853 Personal history of malignant neoplasm of breast: Secondary | ICD-10-CM

## 2020-01-24 ENCOUNTER — Other Ambulatory Visit: Payer: Self-pay | Admitting: Family Medicine

## 2020-01-24 DIAGNOSIS — I10 Essential (primary) hypertension: Secondary | ICD-10-CM

## 2020-01-24 DIAGNOSIS — E7800 Pure hypercholesterolemia, unspecified: Secondary | ICD-10-CM

## 2020-01-31 ENCOUNTER — Other Ambulatory Visit: Payer: Self-pay | Admitting: Family Medicine

## 2020-01-31 DIAGNOSIS — I10 Essential (primary) hypertension: Secondary | ICD-10-CM

## 2020-01-31 MED ORDER — HYDROCHLOROTHIAZIDE 12.5 MG PO CAPS: 12.5000 mg | ORAL_CAPSULE | Freq: Every day | ORAL | 0 refills | Status: DC

## 2020-02-02 ENCOUNTER — Telehealth: Payer: Self-pay | Admitting: Family Medicine

## 2020-02-02 DIAGNOSIS — M5416 Radiculopathy, lumbar region: Secondary | ICD-10-CM | POA: Diagnosis not present

## 2020-02-02 DIAGNOSIS — M5136 Other intervertebral disc degeneration, lumbar region: Secondary | ICD-10-CM | POA: Diagnosis not present

## 2020-02-02 DIAGNOSIS — M47816 Spondylosis without myelopathy or radiculopathy, lumbar region: Secondary | ICD-10-CM | POA: Diagnosis not present

## 2020-02-02 DIAGNOSIS — E039 Hypothyroidism, unspecified: Secondary | ICD-10-CM

## 2020-02-02 MED ORDER — LEVOTHYROXINE SODIUM 75 MCG PO TABS: 75.0000 ug | ORAL_TABLET | Freq: Every day | ORAL | 0 refills | Status: DC

## 2020-02-02 NOTE — Telephone Encounter (Signed)
Done

## 2020-02-02 NOTE — Telephone Encounter (Signed)
[facility] req Levothyroxine 75 mcg #90

## 2020-02-28 ENCOUNTER — Ambulatory Visit
Admission: RE | Admit: 2020-02-28 | Discharge: 2020-02-28 | Disposition: A | Discharge status: Home / Self Care | Payer: Medicare Other | Source: Ambulatory Visit | Attending: Oncology | Admitting: Oncology

## 2020-02-28 ENCOUNTER — Other Ambulatory Visit: Payer: Self-pay

## 2020-02-28 DIAGNOSIS — Z853 Personal history of malignant neoplasm of breast: Secondary | ICD-10-CM

## 2020-02-28 LAB — HM MAMMOGRAPHY

## 2020-03-07 NOTE — Progress Notes (Signed)
Chief Complaint  Patient presents with  . Hypothyroidism    med check, not having any new thyroid symptoms but would like to have TSH checked today while here.    Patient presents for 6 month med check.  Hypercholesterolemia:  She is compliant with taking Crestor 1m every other day (she had myalgias, "weakness" in her legs when it was taken daily, despite using coenzyme Q10).   She continues to limit bacon, eggs, instead eating Cheerios with 2% milk.  Red meat 2x/week or less. (previously didn't tolerate lipitor, "felt like she couldn't move", achey in her legs). Lipids were at goal on last check: Lab Results  Component Value Date   CHOL 176 09/06/2019   HDL 69 09/06/2019   LDLCALC 88 09/06/2019   TRIG 106 09/06/2019   CHOLHDL 2.6 09/06/2019   Hypertension:BP's are checked occasionally, and have been running 128-low 130's/70's.  She is compliant with and tolerating HCTZ 12.5 mg without side effects. She eats bananas in her diet. She denies muscle cramps. Denies headaches, dizziness, chest pain, shortness of breath or edema. She reports her monitor was verified as accurate in the past.  (Put on HCTZ 12.540mn 08/2017; prev on losartan HCT, stopped when she got very sick with chemo, was dehydrated. Her cough improved after stopping losartan HCT, so HCTZ alone was restarted for BP control).   Breast Cancer:She was diagnosed in 02/2017 withinvasive ductal carcinoma, grade 1, estrogen receptor 100% positive, and is s/pright lumpectomy and sentinel lymph node biopsy in 03/2017.Mammaprint returned high risk,so she underwent adjuvant chemotherapy.She completed radiation with Dr. SqIsidore MoosShe started onAnastrozolein11/2018, switched to tamoxifen in 04/2018 due to excessive fatigue.  Fatigue improved (not completely). She has hot flashes, and was started on Effexor 37.48m54mr these. That hadn't helped much, so at her last visit with Dr. MagJana Hakimn 04/2019, her tamoxifen dose was decreased to  45m81md Effexor dose was increased to 748mg66moth were done at the same time. Her hot flashes had improved, but she was having headaches.  She subsequently reduced the effexor dose back to 37.48mg. 70me gets hot, not really flashes, and is tolerable.  She doesn't handle the heat/humidity well,  Headaches resolved. She has f/u scheduled with Dr. MagrinJana Hakimptember.  Hypothyroidism: She reports compliance with her medications, on 748mcg.68me denies any changes inhair/skin/nails, bowels,moods.She is "dry head to toe", but this is no different for her.  She notices some more fatigue (as well as the heat intolerance noted above).  Would like her TSH checked today.  She had lost some weight last year, eating smaller lunches. TSH was normal on last check. Lab Results  Component Value Date   TSH 3.170 09/06/2019   Psoriasis:She uses clobetasol prn. This hasn't flared recently (always is worse in the winter). She used to help control her psoriasis by using tanning bed, but none since she was diagnosed with cancer 2018. She sees dermatologist yearly for skin checks..  GERDMarland Kitchen-She has been on daily Omeprazole 40mg.  58mhas had recurrence of reflux symptoms in the past on OTC dose of Nexium, and when not taking any PPI. She has been doing well on omeprazole, with no missed doses, and no symptoms.Denies any dysphagia.  She continues to have low back pain, had seen Dr. NudelmanSherwood Gambler gotten epidural injections, last in 01/2020 (had also had 09/2019, 04/2019). Injections are helpful.  Not currently bothering her (2-3/10 pain). She has pain with prolonged standing. No longer radiates into the back of  her legs. Pain is mostly at the base of her spine (and near sacrum), sometimes radiates to the left buttock/hip.  IFG: Tries to limit sweets, carbs. 2 Oreo cookies with her coffee sometimes.Only an occasional sweet tea. Drinks diet soda. Lab Results  Component Value Date   HGBA1C 5.3 09/06/2019    PMH,  PSH, SH reviewed  Outpatient Encounter Medications as of 03/08/2020  Medication Sig Note  . Coenzyme Q10 (CO Q-10) 100 MG CAPS Take by mouth daily.   . hydrochlorothiazide (MICROZIDE) 12.5 MG capsule Take 1 capsule (12.5 mg total) by mouth daily.   Marland Kitchen levothyroxine (SYNTHROID) 75 MCG tablet Take 1 tablet (75 mcg total) by mouth daily.   Marland Kitchen loratadine (CLARITIN) 10 MG tablet Take 10 mg by mouth daily as needed for allergies.    . Multiple Vitamins-Minerals (ALIVE WOMENS 50+ PO) Take 1 tablet by mouth daily.    Marland Kitchen omeprazole (PRILOSEC) 40 MG capsule TAKE 1 CAPSULE BY MOUTH EVERY DAY   . rosuvastatin (CRESTOR) 10 MG tablet TAKE 1 TABLET BY MOUTH EVERY DAY   . tamoxifen (NOLVADEX) 10 MG tablet Take 1 tablet (10 mg total) by mouth daily.   Marland Kitchen venlafaxine XR (EFFEXOR-XR) 37.5 MG 24 hr capsule TAKE 1 CAPSULE BY MOUTH ONCE DAILY WITH BREAKFAST   . clobetasol cream (TEMOVATE) 0.05 % Apply topically 2 (two) times daily as needed. (Patient not taking: Reported on 03/08/2020)   . hydrocortisone cream 0.5 % Apply 1 application topically 2 (two) times daily. (Patient not taking: Reported on 03/08/2020) 12/15/2019: Only on face  . nitrofurantoin, macrocrystal-monohydrate, (MACROBID) 100 MG capsule Take 100 mg by mouth 2 (two) times daily. (Patient not taking: Reported on 03/08/2020) 09/03/2018: Took regularly once daily for a month, stopped a week ago (used to only use after intercourse).  . [DISCONTINUED] Calamine 8-8 % LOTN Apply 1 application topically as needed.   . [DISCONTINUED] diphenhydrAMINE (BENADRYL) 25 MG tablet Take 50 mg by mouth at bedtime.   . [DISCONTINUED] predniSONE (DELTASONE) 10 MG tablet Take as directed (6/5/4/4/3/3/2/2/2/1/1/1/1)    No facility-administered encounter medications on file as of 03/08/2020.   Allergies  Allergen Reactions  . Adhesive [Tape] Other (See Comments)    SKIN TEARS  . Ciprofloxacin Nausea Only  . Codeine Nausea Only  . Iodine Swelling  . Polysporin  [Bacitracin-Polymyxin B] Swelling    OPHTHALMIC - SWELLING OF EYES  . Penicillins Rash       . Sulfa Antibiotics Itching    ROS:  No fever, chills, URI symptoms, chest pain, palpitations, shortness of breath.  No urinary complaints. No bleeding, bruising, rashes. Some bruising easily when she bumps into things. Some decrease in energy recently (worn out from caring for her granddaughter); normal appetite, hair/skin/nails and bowels. LBP per HPI, doing well Hot flashes are manageable. Moods are good   PHYSICAL EXAM:  BP 130/70   Pulse 72   Ht _0  (1.626 m)   Wt 166 lb 6.4 oz (75.5 kg)   BMI 28.56 kg/m   Wt Readings from Last 3 Encounters:  03/08/20 166 lb 6.4 oz (75.5 kg)  12/15/19 162 lb (73.5 kg)  12/10/19 162 lb 3.2 oz (73.6 kg)   Well developed, well-appearing female, in good spirits HEENT: conjunctiva and sclera are clear, EOMI. Wearing mask. Neck: No lymphadenopathy, thyromegaly or mass, no bruit Heart: regular rate and rhythm Lungs: clear bilaterally Back: no spinal or CVA tenderness Abdomen: soft, nontender, no masses Extremities: no edema, 2+ pulses.  Skin: normal turgor,  no rashes. Some purpura (forearms) and ecchymosis (L elbow) noted Psych: normal mood, affect, hygiene and grooming Neuro: alert and oriented, normal gait  ASSESSMENT/PLAN:  Essential hypertension, benign - well controlled on HCTZ  Pure hypercholesterolemia - at goal on last check, cont current meds  Hypothyroidism, unspecified type - some fatigue, pt requests recheck of TSH today - Plan: TSH  IFG (impaired fasting glucose) - encouraged proper diet and daily exercise  Malignant neoplasm of upper-inner quadrant of right breast in female, estrogen receptor positive ([place]) - on tamoxifen and under the care of Dr. [name]  Gastroesophageal reflux disease, unspecified whether esophagitis present - controlled  Hot flashes due to tamoxifen - improved with lowering dose.  She didn't tolerate  higher doses of Effexor (HA), or gabapentin in the past  Senile purpura ([place]) - noted on forearms  Declined getting c-met and CBC done today, as she states Dr. [name] will be doing that in September (and may do it again even if done here and labs forwarded, per pt). TSH done today per pt request, due to increased fatigue.  No refills needed today. Using [facility] now, not [facility]

## 2020-03-08 ENCOUNTER — Encounter: Payer: Self-pay | Admitting: *Deleted

## 2020-03-08 ENCOUNTER — Encounter: Payer: Self-pay | Admitting: Family Medicine

## 2020-03-08 ENCOUNTER — Ambulatory Visit (INDEPENDENT_AMBULATORY_CARE_PROVIDER_SITE_OTHER): Payer: Medicare Other | Admitting: Family Medicine

## 2020-03-08 ENCOUNTER — Other Ambulatory Visit: Payer: Self-pay

## 2020-03-08 VITALS — BP 130/70 | HR 72 | Ht 64.0 in | Wt 166.4 lb

## 2020-03-08 DIAGNOSIS — C50211 Malignant neoplasm of upper-inner quadrant of right female breast: Secondary | ICD-10-CM | POA: Diagnosis not present

## 2020-03-08 DIAGNOSIS — E7800 Pure hypercholesterolemia, unspecified: Secondary | ICD-10-CM | POA: Diagnosis not present

## 2020-03-08 DIAGNOSIS — R232 Flushing: Secondary | ICD-10-CM

## 2020-03-08 DIAGNOSIS — Z170 Estrogen receptor positive status [ER+]: Secondary | ICD-10-CM | POA: Diagnosis not present

## 2020-03-08 DIAGNOSIS — T451X5A Adverse effect of antineoplastic and immunosuppressive drugs, initial encounter: Secondary | ICD-10-CM | POA: Diagnosis not present

## 2020-03-08 DIAGNOSIS — E039 Hypothyroidism, unspecified: Secondary | ICD-10-CM | POA: Diagnosis not present

## 2020-03-08 DIAGNOSIS — D692 Other nonthrombocytopenic purpura: Secondary | ICD-10-CM

## 2020-03-08 DIAGNOSIS — K219 Gastro-esophageal reflux disease without esophagitis: Secondary | ICD-10-CM | POA: Diagnosis not present

## 2020-03-08 DIAGNOSIS — I10 Essential (primary) hypertension: Secondary | ICD-10-CM | POA: Diagnosis not present

## 2020-03-08 DIAGNOSIS — R7301 Impaired fasting glucose: Secondary | ICD-10-CM | POA: Diagnosis not present

## 2020-03-08 NOTE — Patient Instructions (Signed)
Continue your current medications. Stay well hydrated, and stay cool in this heat! We will be in touch with your lab results tomorrow via [facility].

## 2020-03-09 LAB — TSH: TSH: 2.33 u[IU]/mL (ref 0.450–4.500)

## 2020-03-10 DIAGNOSIS — D692 Other nonthrombocytopenic purpura: Secondary | ICD-10-CM | POA: Insufficient documentation

## 2020-03-14 ENCOUNTER — Encounter: Payer: Self-pay | Admitting: Family Medicine

## 2020-03-16 DIAGNOSIS — M542 Cervicalgia: Secondary | ICD-10-CM | POA: Diagnosis not present

## 2020-03-16 DIAGNOSIS — M47816 Spondylosis without myelopathy or radiculopathy, lumbar region: Secondary | ICD-10-CM | POA: Diagnosis not present

## 2020-03-16 DIAGNOSIS — M5136 Other intervertebral disc degeneration, lumbar region: Secondary | ICD-10-CM | POA: Diagnosis not present

## 2020-03-20 ENCOUNTER — Other Ambulatory Visit: Payer: Self-pay

## 2020-03-20 ENCOUNTER — Other Ambulatory Visit (HOSPITAL_COMMUNITY): Payer: Self-pay | Admitting: Neurosurgery

## 2020-03-20 ENCOUNTER — Ambulatory Visit (HOSPITAL_COMMUNITY)
Admission: RE | Admit: 2020-03-20 | Discharge: 2020-03-20 | Disposition: A | Discharge status: Home / Self Care | Payer: Medicare Other | Source: Ambulatory Visit | Attending: Neurosurgery | Admitting: Neurosurgery

## 2020-03-20 DIAGNOSIS — M542 Cervicalgia: Secondary | ICD-10-CM

## 2020-03-20 DIAGNOSIS — M50223 Other cervical disc displacement at C6-C7 level: Secondary | ICD-10-CM | POA: Diagnosis not present

## 2020-03-23 DIAGNOSIS — H25812 Combined forms of age-related cataract, left eye: Secondary | ICD-10-CM | POA: Diagnosis not present

## 2020-03-23 DIAGNOSIS — H2512 Age-related nuclear cataract, left eye: Secondary | ICD-10-CM | POA: Diagnosis not present

## 2020-04-20 ENCOUNTER — Other Ambulatory Visit: Payer: Self-pay | Admitting: Oncology

## 2020-04-27 ENCOUNTER — Other Ambulatory Visit: Payer: Self-pay | Admitting: Family Medicine

## 2020-04-27 ENCOUNTER — Other Ambulatory Visit: Payer: Self-pay

## 2020-04-27 DIAGNOSIS — E7800 Pure hypercholesterolemia, unspecified: Secondary | ICD-10-CM

## 2020-04-27 DIAGNOSIS — L409 Psoriasis, unspecified: Secondary | ICD-10-CM

## 2020-04-27 MED ORDER — CLOBETASOL PROPIONATE 0.05 % EX CREA: TOPICAL_CREAM | Freq: Two times a day (BID) | CUTANEOUS | 0 refills | Status: AC | PRN

## 2020-04-27 MED ORDER — ROSUVASTATIN CALCIUM 10 MG PO TABS: 10.0000 mg | ORAL_TABLET | Freq: Every day | ORAL | 0 refills | Status: DC

## 2020-04-27 NOTE — Telephone Encounter (Signed)
Is this okay to refill? 

## 2020-05-01 ENCOUNTER — Other Ambulatory Visit: Payer: Self-pay | Admitting: Family Medicine

## 2020-05-01 DIAGNOSIS — I10 Essential (primary) hypertension: Secondary | ICD-10-CM

## 2020-05-01 MED ORDER — HYDROCHLOROTHIAZIDE 12.5 MG PO CAPS: 12.5000 mg | ORAL_CAPSULE | Freq: Every day | ORAL | 1 refills | Status: DC

## 2020-05-02 ENCOUNTER — Other Ambulatory Visit: Payer: Self-pay | Admitting: Family Medicine

## 2020-05-02 DIAGNOSIS — E039 Hypothyroidism, unspecified: Secondary | ICD-10-CM

## 2020-05-04 ENCOUNTER — Inpatient Hospital Stay: Payer: Medicare Other

## 2020-05-04 ENCOUNTER — Inpatient Hospital Stay: Payer: Medicare Other | Attending: Oncology | Admitting: Oncology

## 2020-05-04 ENCOUNTER — Other Ambulatory Visit: Payer: Self-pay

## 2020-05-04 VITALS — BP 165/68 | HR 64 | Temp 97.5°F | Resp 18 | Ht 64.0 in | Wt 167.8 lb

## 2020-05-04 DIAGNOSIS — Z79810 Long term (current) use of selective estrogen receptor modulators (SERMs): Secondary | ICD-10-CM | POA: Insufficient documentation

## 2020-05-04 DIAGNOSIS — Z170 Estrogen receptor positive status [ER+]: Secondary | ICD-10-CM

## 2020-05-04 DIAGNOSIS — K219 Gastro-esophageal reflux disease without esophagitis: Secondary | ICD-10-CM | POA: Diagnosis not present

## 2020-05-04 DIAGNOSIS — Z9221 Personal history of antineoplastic chemotherapy: Secondary | ICD-10-CM | POA: Insufficient documentation

## 2020-05-04 DIAGNOSIS — Z79899 Other long term (current) drug therapy: Secondary | ICD-10-CM | POA: Diagnosis not present

## 2020-05-04 DIAGNOSIS — E785 Hyperlipidemia, unspecified: Secondary | ICD-10-CM | POA: Diagnosis not present

## 2020-05-04 DIAGNOSIS — C50211 Malignant neoplasm of upper-inner quadrant of right female breast: Secondary | ICD-10-CM | POA: Diagnosis not present

## 2020-05-04 DIAGNOSIS — M1990 Unspecified osteoarthritis, unspecified site: Secondary | ICD-10-CM | POA: Diagnosis not present

## 2020-05-04 DIAGNOSIS — E039 Hypothyroidism, unspecified: Secondary | ICD-10-CM | POA: Diagnosis not present

## 2020-05-04 DIAGNOSIS — Z923 Personal history of irradiation: Secondary | ICD-10-CM | POA: Insufficient documentation

## 2020-05-04 DIAGNOSIS — I427 Cardiomyopathy due to drug and external agent: Secondary | ICD-10-CM

## 2020-05-04 LAB — CBC WITH DIFFERENTIAL/PLATELET
Abs Immature Granulocytes: 0 10*3/uL (ref 0.00–0.07)
Basophils Absolute: 0 10*3/uL (ref 0.0–0.1)
Basophils Relative: 1 %
Eosinophils Absolute: 0.1 10*3/uL (ref 0.0–0.5)
Eosinophils Relative: 2 %
HCT: 37.7 % (ref 36.0–46.0)
Hemoglobin: 12.3 g/dL (ref 12.0–15.0)
Immature Granulocytes: 0 %
Lymphocytes Relative: 33 %
Lymphs Abs: 1.5 10*3/uL (ref 0.7–4.0)
MCH: 31.5 pg (ref 26.0–34.0)
MCHC: 32.6 g/dL (ref 30.0–36.0)
MCV: 96.4 fL (ref 80.0–100.0)
Monocytes Absolute: 0.5 10*3/uL (ref 0.1–1.0)
Monocytes Relative: 11 %
Neutro Abs: 2.4 10*3/uL (ref 1.7–7.7)
Neutrophils Relative %: 53 %
Platelets: 137 10*3/uL — ABNORMAL LOW (ref 150–400)
RBC: 3.91 MIL/uL (ref 3.87–5.11)
RDW: 12.8 % (ref 11.5–15.5)
WBC: 4.4 10*3/uL (ref 4.0–10.5)
nRBC: 0 % (ref 0.0–0.2)

## 2020-05-04 LAB — COMPREHENSIVE METABOLIC PANEL
ALT: 20 U/L (ref 0–44)
AST: 28 U/L (ref 15–41)
Albumin: 3.5 g/dL (ref 3.5–5.0)
Alkaline Phosphatase: 50 U/L (ref 38–126)
Anion gap: 6 (ref 5–15)
BUN: 12 mg/dL (ref 8–23)
CO2: 29 mmol/L (ref 22–32)
Calcium: 9 mg/dL (ref 8.9–10.3)
Chloride: 105 mmol/L (ref 98–111)
Creatinine, Ser: 0.77 mg/dL (ref 0.44–1.00)
GFR calc Af Amer: >60 mL/min (ref >60)
GFR calc non Af Amer: >60 mL/min (ref >60)
Glucose, Bld: 98 mg/dL (ref 70–99)
Potassium: 4.1 mmol/L (ref 3.5–5.1)
Sodium: 140 mmol/L (ref 135–145)
Total Bilirubin: 0.3 mg/dL (ref 0.3–1.2)
Total Protein: 6.7 g/dL (ref 6.5–8.1)

## 2020-05-04 MED ORDER — VENLAFAXINE HCL ER 37.5 MG PO CP24: ORAL_CAPSULE | ORAL | 4 refills | Status: DC

## 2020-05-04 MED ORDER — TAMOXIFEN CITRATE 10 MG PO TABS: 10.0000 mg | ORAL_TABLET | Freq: Every day | ORAL | 4 refills | Status: DC

## 2020-05-04 NOTE — Progress Notes (Signed)
[name]  [phone] [phone]     ID: Mckenzie Webb DOB: 08/30/1945  MR#: [id number]  ZJQ#:[id number]  Patient Care Team: Rita Ohara, MD as PCP - General (Family Medicine) Jovita Kussmaul, MD as Consulting Physician (General Surgery) Henretta Quist, Virgie Dad, MD as Consulting Physician (Oncology) Eppie Gibson, MD as Attending Physician (Radiation Oncology) Juanita Craver, MD as Consulting Physician (Gastroenterology) Vania Rea, MD as Consulting Physician (Obstetrics and Gynecology) Elsie Saas, MD as Consulting Physician (Orthopedic Surgery) Martinique, Amy, MD as Consulting Physician (Dermatology) Delice Bison, Charlestine Massed, NP as Nurse Practitioner (Hematology and Oncology) OTHER MD:   CHIEF COMPLAINT: Estrogen receptor positive breast cancer  CURRENT TREATMENT: tamoxifen at 10 mg   INTERVAL HISTORY: Mckenzie Webb returns today for follow-up of her estrogen receptor positive breast cancer.   She continues on tamoxifen.  She is tolerating it better on low-dose venlafaxine.  We tried to increase the venlafaxine but that gave her headaches so we are back to 37.5 mg daily.  Since her last visit, she underwent bilateral diagnostic mammography with tomography at [facility] on 02/28/2020 showing: breast density category B; no evidence of malignancy in either breast.    REVIEW OF SYSTEMS: Mckenzie Webb is pretty frustrated with the entire Covid thing and hates wearing masks but she did get vaccinated with the Moderna vaccine.  She went to the beach for a week and is keeping her granddaughter who is 57 years old frequently.  She enjoys gardening.  She is not otherwise exercising regularly.  A detailed review of systems today was otherwise stable   BREAST CANCER HISTORY: From the original intake note:  "Mckenzie Webb" had bilateral screening mammography with tomography at the [facility] 02/21/2017. This showed a possible mass in the right breast. Right diagnostic  mammography with tomography and ultrasonography 02/27/2017 found a breast density to be category B. In the subareolar right breast there was a 0.5 cm spiculated mass, which was not palpable. There was mild right nipple inversion, which per report was chronic. Ultrasonography confirmed a 0.5 cm retroareolar breast mass at the 1:00 radiant. The right axilla was sonographically benign.  On 03/03/2017 the patient underwent biopsy of the right breast mass in question. This showed (SAA [phone]) and invasive ductal carcinoma, grade 1, estrogen receptor 100% positive, progesterone receptor negative, with an MIB-1 of 3%, and no HER-2 implication, the signals ratio being 1.25 and the number per cell 2.00.  The patient's subsequent history is as detailed below.   PAST MEDICAL HISTORY: Past Medical History:  Diagnosis Date  . Arthritis    lower back  . Cough 07/22/2017  . Dental crowns present   . Family history of adverse reaction to anesthesia    pt's sister has hx. of post-op N/V  . GERD (gastroesophageal reflux disease)   . History of chemotherapy    finished chemo 06/30/2017  . History of radiation therapy 07/29/17- 08/28/17   Right Breast and Axilla treated to 42.56 Gy with 16 fx of 2.66. Boost of 8 Gy with 4 fx of 2 Gy.   Marland Kitchen History of right breast cancer 02/2017  . Hyperlipidemia   . Hypothyroid   . Leukopenia 07/07/2017  . Personal history of chemotherapy   . Personal history of radiation therapy   . Stuffy and runny nose 07/22/2017   clear drainage from nose, per pt.    PAST SURGICAL HISTORY: Past Surgical History:  Procedure Laterality Date  . BREAST LUMPECTOMY Right 03/26/2017  . BREAST LUMPECTOMY WITH RADIOACTIVE  SEED AND SENTINEL LYMPH NODE BIOPSY Right 03/26/2017   Procedure: BREAST LUMPECTOMY WITH RADIOACTIVE SEED AND SENTINEL LYMPH NODE BIOPSY;  Surgeon: Jovita Kussmaul, MD;  Location: [facility];  Service: General;  Laterality: Right;  . CHOLECYSTECTOMY    .  COLONOSCOPY  3/09  . CYST EXCISION  02/01/2003   knee  . KNEE ARTHROSCOPY Right 1990  . KNEE ARTHROSCOPY Left 12/12/2010  . PORT-A-CATH REMOVAL N/A 09/03/2017   Procedure: REMOVAL PORT-A-CATH;  Surgeon: Jovita Kussmaul, MD;  Location: [facility];  Service: General;  Laterality: N/A;  . PORTACATH PLACEMENT Left 04/24/2017   Procedure: INSERTION PORT-A-CATH;  Surgeon: Jovita Kussmaul, MD;  Location: [facility];  Service: General;  Laterality: Left;  . TUBAL LIGATION  1983  . VARICOSE VEIN SURGERY  09/2009 R, 06/2010 L    FAMILY HISTORY Family History  Problem Relation Age of Onset  . Hypertension Mother   . Heart disease Mother   . Gallbladder disease Mother   . Thyroid disease Mother   . Arthritis Mother   . Diabetes Father   . Cancer Father        pancreatic  . Hypertension Sister   . Gallbladder disease Sister   . Diabetes Sister   . Diverticulitis Sister        of small intestine  . Cancer Son        testicular cancer  . Stroke Sister        late 62's  . Hypertension Sister   . Pulmonary embolism Sister   . Ulcerative colitis Sister   . COPD Brother        had lung lobe removed for suspected cancer, but was benign; smoker  . Colon cancer Other 65  . Lung cancer Cousin        maternal first cousin - smoker  The patient's father died from pancreatic cancer at the age of 24. The patient's mother died from a myocardial infarction at age 11. The patient had one brother, 3 sisters. The patient's older son was diagnosed with testicular cancer at age 26. A maternal niece was diagnosed with colon cancer at age 11 and a maternal cousin with lung cancer at an unknown age.   GYNECOLOGIC HISTORY:  No LMP recorded. Patient is postmenopausal.  Menarche age 77, first live birth age 42, the patient is [place] P2. she went through the change of life approximately the year 2000. She did not take hormone replacement. She did take oral contraceptives for more than 20 years remotely, with  no complications.   SOCIAL HISTORY:  She is a retired Web designer. Her husband Arnette Norris is an Clinical biochemist, still working part-time. Son Corene Cornea (from the patient's first marriage) lives in [place] and works in Therapist, art. Son Catalina Antigua (from patient second marriage) lives in [place] we are is a Administrator, arts. The patient has one grandchild. She is not a Ambulance person.    ADVANCED DIRECTIVES: In the absence of any documentation to the contrary, the patient's spouse is their HCPOA.    HEALTH MAINTENANCE: Social History   Tobacco Use  . Smoking status: Never Smoker  . Smokeless tobacco: Never Used  Vaping Use  . Vaping Use: Never used  Substance Use Topics  . Alcohol use: Not Currently    Comment: none since her cancer diagnosis 2018  . Drug use: No     Colonoscopy: April 2019/ Dr. Mann/ normal  PAP:  B[facility] density: 12/27/2014 [facility], T  score -0.1 (normal)   Allergies  Allergen Reactions  . Adhesive [Tape] Other (See Comments)    SKIN TEARS  . Ciprofloxacin Nausea Only  . Codeine Nausea Only  . Iodine Swelling  . Polysporin [Bacitracin-Polymyxin B] Swelling    OPHTHALMIC - SWELLING OF EYES  . Penicillins Rash       . Sulfa Antibiotics Itching    Current Outpatient Medications  Medication Sig Dispense Refill  . clobetasol cream (TEMOVATE) 0.05 % Apply topically 2 (two) times daily as needed. 60 g 0  . Coenzyme Q10 (CO Q-10) 100 MG CAPS Take by mouth daily.    . hydrochlorothiazide (MICROZIDE) 12.5 MG capsule Take 1 capsule (12.5 mg total) by mouth daily. 90 capsule 1  . levothyroxine (SYNTHROID) 75 MCG tablet TAKE 1 TABLET BY MOUTH EVERY DAY 90 tablet 0  . loratadine (CLARITIN) 10 MG tablet Take 10 mg by mouth daily as needed for allergies.     . Multiple Vitamins-Minerals (ALIVE WOMENS 50+ PO) Take 1 tablet by mouth daily.     Marland Kitchen omeprazole (PRILOSEC) 40 MG capsule TAKE 1 CAPSULE BY MOUTH EVERY DAY 90 capsule 1  . rosuvastatin (CRESTOR) 10 MG  tablet Take 1 tablet (10 mg total) by mouth daily. 90 tablet 0  . tamoxifen (NOLVADEX) 10 MG tablet Take 1 tablet (10 mg total) by mouth daily. 90 tablet 4  . venlafaxine XR (EFFEXOR-XR) 37.5 MG 24 hr capsule TAKE 1 CAPSULE BY MOUTH ONCE DAILY WITH BREAKFAST 90 capsule 4   No current facility-administered medications for this visit.    OBJECTIVE: White woman who appears stated age  36:   05/04/20 1354  BP: (!) 165/68  Pulse: 64  Resp: 18  Temp: (!) 97.5 F (36.4 C)  SpO2: 100%   Wt Readings from Last 3 Encounters:  05/04/20 167 lb 12.8 oz (76.1 kg)  03/08/20 166 lb 6.4 oz (75.5 kg)  12/15/19 162 lb (73.5 kg)   Body mass index is 28.8 kg/m.    ECOG FS:1 - Symptomatic but completely ambulatory  Sclerae unicteric, EOMs intact Wearing a mask No cervical or supraclavicular adenopathy Lungs no rales or rhonchi Heart regular rate and rhythm Abd soft, nontender, positive bowel sounds MSK no focal spinal tenderness, no upper extremity lymphedema Neuro: nonfocal, well oriented, appropriate affect Breasts: The right breast has undergone lumpectomy and radiation.  There is no evidence of local recurrence.  Both axillae are benign.    LAB RESULTS:  CMP     Component Value Date/Time   NA 140 05/04/2020 1430   NA 143 09/06/2019 0947   NA 142 08/01/2017 1056   K 4.1 05/04/2020 1430   K 4.4 08/01/2017 1056   CL 105 05/04/2020 1430   CO2 29 05/04/2020 1430   CO2 24 08/01/2017 1056   GLUCOSE 98 05/04/2020 1430   GLUCOSE 101 08/01/2017 1056   BUN 12 05/04/2020 1430   BUN 13 09/06/2019 0947   BUN 11.1 08/01/2017 1056   CREATININE 0.77 05/04/2020 1430   CREATININE 0.8 08/01/2017 1056   CALCIUM 9.0 05/04/2020 1430   CALCIUM 9.6 08/01/2017 1056   PROT 6.7 05/04/2020 1430   PROT 6.5 09/06/2019 0947   PROT 6.6 08/01/2017 1056   ALBUMIN 3.5 05/04/2020 1430   ALBUMIN 3.8 09/06/2019 0947   ALBUMIN 3.6 08/01/2017 1056   AST 28 05/04/2020 1430   AST 18 08/01/2017 1056   ALT  20 05/04/2020 1430   ALT 17 08/01/2017 1056   ALKPHOS 50 05/04/2020 1430  ALKPHOS 67 08/01/2017 1056   BILITOT 0.3 05/04/2020 1430   BILITOT 0.4 09/06/2019 0947   BILITOT 0.38 08/01/2017 1056   GFRNONAA >60 05/04/2020 1430   GFRAA >60 05/04/2020 1430    No results found for: TOTALPROTELP, ALBUMINELP, A1GS, A2GS, BETS, BETA2SER, GAMS, MSPIKE, SPEI  No results found for: Nils Pyle, [facility]  Lab Results  Component Value Date   WBC 4.4 05/04/2020   NEUTROABS 2.4 05/04/2020   HGB 12.3 05/04/2020   HCT 37.7 05/04/2020   MCV 96.4 05/04/2020   PLT 137 (L) 05/04/2020      Chemistry      Component Value Date/Time   NA 140 05/04/2020 1430   NA 143 09/06/2019 0947   NA 142 08/01/2017 1056   K 4.1 05/04/2020 1430   K 4.4 08/01/2017 1056   CL 105 05/04/2020 1430   CO2 29 05/04/2020 1430   CO2 24 08/01/2017 1056   BUN 12 05/04/2020 1430   BUN 13 09/06/2019 0947   BUN 11.1 08/01/2017 1056   CREATININE 0.77 05/04/2020 1430   CREATININE 0.8 08/01/2017 1056      Component Value Date/Time   CALCIUM 9.0 05/04/2020 1430   CALCIUM 9.6 08/01/2017 1056   ALKPHOS 50 05/04/2020 1430   ALKPHOS 67 08/01/2017 1056   AST 28 05/04/2020 1430   AST 18 08/01/2017 1056   ALT 20 05/04/2020 1430   ALT 17 08/01/2017 1056   BILITOT 0.3 05/04/2020 1430   BILITOT 0.4 09/06/2019 0947   BILITOT 0.38 08/01/2017 1056       No results found for: LABCA2  No components found for: [id number]  No results for input(s): INR in the last 168 hours.  Urinalysis    Component Value Date/Time   COLORURINE YELLOW 05/02/2017 2201   APPEARANCEUR HAZY (A) 05/02/2017 2201   LABSPEC 1.015 10/21/2018 1107   PHURINE 6.0 05/02/2017 2201   GLUCOSEU NEGATIVE 05/02/2017 2201   HGBUR SMALL (A) 05/02/2017 2201   BILIRUBINUR negative 10/21/2018 1107   BILIRUBINUR neg 08/17/2014 1123   KETONESUR negative 10/21/2018 1107   KETONESUR NEGATIVE 05/02/2017 2201   PROTEINUR negative 10/21/2018 1107    PROTEINUR NEGATIVE 05/02/2017 2201   UROBILINOGEN negative 08/17/2014 1123   NITRITE Negative 10/21/2018 1107   NITRITE POSITIVE (A) 05/02/2017 2201   LEUKOCYTESUR Negative 10/21/2018 1107    STUDIES: No results found.   ELIGIBLE FOR AVAILABLE RESEARCH PROTOCOL: no  ASSESSMENT: 74 y.o. [place] woman status post right breast upper inner quadrant biopsy 03/04/2015 for a clinical T1a N0, stage IA invasive ductal carcinoma, grade 1, estrogen receptor positive, progesterone receptor negative, with an MIB-1 of 3% and no HER-2 amplification  (1) anastrozole started neoadjuvantly 03/12/2017, held at the start of chemotherapy, resumed late November 2018  (2) genetics testing 0 03/2017 through the  common hereditary cancer panel offered by [facility] found no deleterious mutations in APC, ATM, AXIN2, BARD1, BMPR1A, BRCA1, BRCA2, BRIP1, CDH1, CDKN2A (p14ARF), CDKN2A (p16INK4a), CHEK2, CTNNA1, DICER1, EPCAM (Deletion/duplication testing only), GREM1 (promoter region deletion/duplication testing only), KIT, MEN1, MLH1, MSH2, MSH3, MSH6, MUTYH, NBN, NF1, NHTL1, PALB2, PDGFRA, PMS2, POLD1, POLE, PTEN, RAD50, RAD51C, RAD51D, SDHB, SDHC, SDHD, SMAD4, SMARCA4. STK11, TP53, TSC1, TSC2, and VHL.  The following genes were evaluated for sequence changes only: SDHA and HOXB13 c.251G>A variant only.    (3) right lumpectomy and sentinel lymph node biopsy 03/26/2017 showed a pT1c pN1, stage IB invasive ductal carcinoma, grade 1, with negative margins  (4) Mammaprint returned high risk, indicating need for adjuvant  chemotherapy  (5) chemotherapy consisting of Cytoxan and docetaxel given every 3 weeks 4 beginning on 04/28/2017  (a) changed to Doxorubicin and Cyclophosphamide beginning 05/19/2017, completing 3 cycles 06/30/2017  (b) dose reduced by 20% with the second and third CA treatments  (6) Adjuvant radiation completed 08/28/2017 Site/dose:   1) Right breast and axilla treated to 42.56 Gy with 16 fx of 2.66    2)  boost of 8 Gy with 4 fx of 2 Gy  (7) Anastrozole started in 06/2017  (a) DEXA scan 02/24/2018 showed a T score of -0.6 normal  (b) anastrozole stopped 04/09/2018--symptoms  (c) tamoxifen started September 2019  (d) tamoxifen dose decreased to 10 mg daily as of September 2020   PLAN: Baker Janus is now just over 3 years out from definitive surgery for her breast cancer with no evidence of disease recurrence.  This is very favorable.  She is tolerating tamoxifen adequately, with no major complications, although she is looking forward to the day when she will no longer needed.  The plan is to continue tamoxifen for an additional 2 years and she will continue at the current dose of 10 mg daily.  We discussed Covid issues and she has taken the appropriate measures by getting vaccinated.  She understands that this does not mean she does not need to wear a mask and that of course is bothersome.  I am going to see her again in 1 year.  She knows to call for any other issue that may develop before then  Total encounter time 25 minutes.*  Martina Brodbeck, Virgie Dad, MD  05/04/20 3:20 PM Medical Oncology and Hematology [facility] [place], [place] [address] Tel. [phone]    Fax. [phone]   I, Wilburn Mylar, am acting as scribe for Dr. Virgie Dad. Gracilyn Gunia.  I, Lurline Del MD, have reviewed the above documentation for accuracy and completeness, and I agree with the above.     *Total Encounter Time as defined by the Centers for Medicare and Medicaid Services includes, in addition to the face-to-face time of a patient visit (documented in the note above) non-face-to-face time: obtaining and reviewing outside history, ordering and reviewing medications, tests or procedures, care coordination (communications with other health care professionals or caregivers) and documentation in the medical record.

## 2020-05-15 DIAGNOSIS — M5136 Other intervertebral disc degeneration, lumbar region: Secondary | ICD-10-CM | POA: Diagnosis not present

## 2020-05-23 ENCOUNTER — Other Ambulatory Visit: Payer: Self-pay | Admitting: Family Medicine

## 2020-05-23 DIAGNOSIS — E039 Hypothyroidism, unspecified: Secondary | ICD-10-CM

## 2020-05-23 MED ORDER — LEVOTHYROXINE SODIUM 75 MCG PO TABS: 75.0000 ug | ORAL_TABLET | Freq: Every day | ORAL | 0 refills | Status: DC

## 2020-06-05 ENCOUNTER — Other Ambulatory Visit (INDEPENDENT_AMBULATORY_CARE_PROVIDER_SITE_OTHER): Payer: Medicare Other

## 2020-06-05 ENCOUNTER — Other Ambulatory Visit: Payer: Self-pay

## 2020-06-05 DIAGNOSIS — B078 Other viral warts: Secondary | ICD-10-CM | POA: Diagnosis not present

## 2020-06-05 DIAGNOSIS — D045 Carcinoma in situ of skin of trunk: Secondary | ICD-10-CM | POA: Diagnosis not present

## 2020-06-05 DIAGNOSIS — Z23 Encounter for immunization: Secondary | ICD-10-CM

## 2020-06-05 DIAGNOSIS — L570 Actinic keratosis: Secondary | ICD-10-CM | POA: Diagnosis not present

## 2020-06-05 DIAGNOSIS — L821 Other seborrheic keratosis: Secondary | ICD-10-CM | POA: Diagnosis not present

## 2020-06-05 DIAGNOSIS — L400 Psoriasis vulgaris: Secondary | ICD-10-CM | POA: Diagnosis not present

## 2020-06-14 ENCOUNTER — Encounter: Payer: Self-pay | Admitting: Family Medicine

## 2020-06-15 ENCOUNTER — Encounter: Payer: Self-pay | Admitting: Family Medicine

## 2020-06-15 ENCOUNTER — Telehealth (INDEPENDENT_AMBULATORY_CARE_PROVIDER_SITE_OTHER): Payer: Medicare Other | Admitting: Family Medicine

## 2020-06-15 ENCOUNTER — Other Ambulatory Visit: Payer: Self-pay

## 2020-06-15 VITALS — BP 138/78 | HR 80 | Temp 97.1°F | Ht 64.0 in | Wt 161.0 lb

## 2020-06-15 DIAGNOSIS — J3489 Other specified disorders of nose and nasal sinuses: Secondary | ICD-10-CM

## 2020-06-15 DIAGNOSIS — J302 Other seasonal allergic rhinitis: Secondary | ICD-10-CM | POA: Diagnosis not present

## 2020-06-15 MED ORDER — FLUTICASONE PROPIONATE 50 MCG/ACT NA SUSP: 2.0000 | Freq: Every day | NASAL | 6 refills | Status: DC

## 2020-06-15 MED ORDER — AZITHROMYCIN 250 MG PO TABS: ORAL_TABLET | ORAL | 0 refills | Status: DC

## 2020-06-15 NOTE — Patient Instructions (Signed)
Drink plenty of water. Continue the Mucinex twice daily as directed on package. Consider trying sinus rinses (sinus rinse kit by [facility] or [employer]), once or twice daily. Start using the Flonase as we discussed--2 gentle sniffs into each nostril, once daily.  If no improvement in your discolored mucus and sinus pain, then start the zpak. If you don't get completely better by day 10, contact us for a refill of the zpak. If you have NO benefit or are worse by the end of the pack (5 days), then this may not be the correct antibiotic and need to be changed.  Feel free to send updates on how you're feeling and with questions, if needed. I hope you feel better soon!

## 2020-06-15 NOTE — Progress Notes (Signed)
Start time: 12:28 End time: 12:54  Virtual Visit via Video Note  I connected with Mckenzie Webb on 06/15/20 by a video enabled telemedicine application and verified that I am speaking with the correct person using two identifiers.  Location: Patient: home alone Provider: office   I discussed the limitations of evaluation and management by telemedicine and the availability of in person appointments. The patient expressed understanding and agreed to proceed.  History of Present Illness:  Chief Complaint  Patient presents with  . Facial Pain    VIRTUAL sinus pain and pressure x 2 weeks. Has had sinus HA, eye hurt, cheeks hurt and teeth hurt. Mucus is dark and yellow.    She has been having sinus headaches, in her eyes, cheeks and even her temples for the last several weeks, possibly 3-4 weeks.  Mucus has been yellow for the last several weeks ("darkish yellow"). She is expectorating the phlegm (doesn't actually have much cough), and nasal drainage is the same.  She is getting up phlegm and nasal drainage mostly in the morning.  No sick contacts  No h/o allergies in the last few years. She is taking claritin daily. +sneezing occasionally.  Taking ES Tylenol which doesn't help much. Taking plain Mucinex twice daily.   PMH, PSH, SH reviewed  Outpatient Encounter Medications as of 06/15/2020  Medication Sig Note  . acetaminophen (TYLENOL) 500 MG tablet Take 1,000 mg by mouth every 6 (six) hours as needed. 06/15/2020: Last dose 8:30am  . clobetasol cream (TEMOVATE) 0.05 % Apply topically 2 (two) times daily as needed.   . Coenzyme Q10 (CO Q-10) 100 MG CAPS Take by mouth daily.   . hydrochlorothiazide (MICROZIDE) 12.5 MG capsule Take 1 capsule (12.5 mg total) by mouth daily.   Marland Kitchen levothyroxine (SYNTHROID) 75 MCG tablet Take 1 tablet (75 mcg total) by mouth daily.   Marland Kitchen loratadine (CLARITIN) 10 MG tablet Take 10 mg by mouth daily as needed for allergies.    . Multiple  Vitamins-Minerals (ALIVE WOMENS 50+ PO) Take 1 tablet by mouth daily.    Marland Kitchen omeprazole (PRILOSEC) 40 MG capsule TAKE 1 CAPSULE BY MOUTH EVERY DAY   . rosuvastatin (CRESTOR) 10 MG tablet Take 1 tablet (10 mg total) by mouth daily.   . tamoxifen (NOLVADEX) 10 MG tablet Take 1 tablet (10 mg total) by mouth daily.   Marland Kitchen venlafaxine XR (EFFEXOR-XR) 37.5 MG 24 hr capsule TAKE 1 CAPSULE BY MOUTH ONCE DAILY WITH BREAKFAST    No facility-administered encounter medications on file as of 06/15/2020.   Allergies  Allergen Reactions  . Adhesive [Tape] Other (See Comments)    SKIN TEARS  . Ciprofloxacin Nausea Only  . Codeine Nausea Only  . Iodine Swelling  . Polysporin [Bacitracin-Polymyxin B] Swelling    OPHTHALMIC - SWELLING OF EYES  . Penicillins Rash       . Sulfa Antibiotics Itching   ROS: no fever.  +fatigue/run down. No significant cough, shortness of breath, chest pain. No nausea, vomiting, diarrhea.  She had a stomach bug with diarrhea and nausea this past weekend, but has resolved.  Denies rashes.   Observations/Objective:  BP 138/78   Pulse 80   Temp (!) 97.1 F (36.2 C) (Temporal)   Ht 5' 4"  (1.626 m)   Wt 161 lb (73 kg)   BMI 27.64 kg/m   Well-appearing, pleasant female, in good spirits. She is alert, oriented. Cranial nerves grossly intact. No sniffling, doesn't sound congested, no throat-clearing or coughing during the visit.  She points to her temples and cheeks and between eyes as area of headache.  Assessment and Plan:  Sinus pain - Plan: fluticasone (FLONASE) 50 MCG/ACT nasal spray, azithromycin (ZITHROMAX) 250 MG tablet  Seasonal allergic rhinitis, unspecified trigger - Plan: fluticasone (FLONASE) 50 MCG/ACT nasal spray  Counseled re: Moderna Booster--to wait 2 weeks from flu shot, can wait another week until she is feeling better.  Supportive measures reviewed in detail, and proper technique for flonase. Encouraged sinus rinses. Advised to wait 1-2 days or more  to see if these other measures help her feel better, prior to starting the ABX.   Follow Up Instructions:    I discussed the assessment and treatment plan with the patient. The patient was provided an opportunity to ask questions and all were answered. The patient agreed with the plan and demonstrated an understanding of the instructions.   The patient was advised to call back or seek an in-person evaluation if the symptoms worsen or if the condition fails to improve as anticipated.  I provided 26 minutes of video face-to-face time during this encounter.  Additional time (5-7 minutes) was spent in chart review and documentation.   Vikki Ports, MD

## 2020-06-23 DIAGNOSIS — M5136 Other intervertebral disc degeneration, lumbar region: Secondary | ICD-10-CM | POA: Diagnosis not present

## 2020-06-23 DIAGNOSIS — M542 Cervicalgia: Secondary | ICD-10-CM | POA: Diagnosis not present

## 2020-06-23 DIAGNOSIS — M47816 Spondylosis without myelopathy or radiculopathy, lumbar region: Secondary | ICD-10-CM | POA: Diagnosis not present

## 2020-08-03 DIAGNOSIS — Z23 Encounter for immunization: Secondary | ICD-10-CM | POA: Diagnosis not present

## 2020-08-07 ENCOUNTER — Other Ambulatory Visit: Payer: Self-pay | Admitting: Oncology

## 2020-08-17 DIAGNOSIS — M5416 Radiculopathy, lumbar region: Secondary | ICD-10-CM | POA: Diagnosis not present

## 2020-08-25 ENCOUNTER — Other Ambulatory Visit: Payer: Self-pay | Admitting: Family Medicine

## 2020-08-25 DIAGNOSIS — E039 Hypothyroidism, unspecified: Secondary | ICD-10-CM

## 2020-09-10 NOTE — Progress Notes (Signed)
Chief Complaint  Patient presents with  . Medicare Wellness    Fasting AWV/med check no pap-sees Dr. Stann Mainland. No new complaints.    Mckenzie Webb is a 75 y.o. female who presents for annual wellness visit and follow-up on chronic medical conditions.    Hypercholesterolemia: She is compliant with taking Crestor 47m every other day (she had myalgias, "weakness" in her legs when it was taken daily, despite using coenzyme Q10). Shecontinues to limit bacon, eggs, instead eating Cheerios with 2% milk. Red meat 2x/week or less (more red meat in the summer, grilling). (previously didn't tolerate lipitor, "felt like she couldn't move", achey in her legs). Lipids were at goal on last check, due for repeat today. Lab Results  Component Value Date   CHOL 176 09/06/2019   HDL 69 09/06/2019   LDLCALC 88 09/06/2019   TRIG 106 09/06/2019   CHOLHDL 2.6 09/06/2019   Hypertension:BP's are checked occasionally, and have been running 120-130/65-70. She is compliant with and tolerating HCTZ 12.5 mg without side effects. She eats bananas in her diet. She denies muscle cramps. Denies headaches, dizziness, chest pain, shortness of breath or edema. She reports her monitor was verified as accurate in the past.  (Put on HCTZ 12.564mn 08/2017; prev on losartan HCT, stopped when she got very sick with chemo, was dehydrated. Her cough improved after stopping losartan HCT, so HCTZ alone was restarted for BP control).   Breast Cancer:She was diagnosed in 02/2017 withinvasive ductal carcinoma, grade 1, estrogen receptor 100% positive, and is s/pright lumpectomy and sentinel lymph node biopsyin 03/2017.Mammaprint returned high risk,so she underwent adjuvant chemotherapy, and also completed radiation.She started onAnastrozolein11/2018, switched to tamoxifen in 04/2018 due to excessive fatigue. Dose was lowered to 1058maily, and plan is for 2 more years of treatment. She last saw Dr. MagJana Hakim 04/2020. She  continues on Effexor 37.5 for hot flashes (had headaches at higher doses). She gets hot, not really flashes, and is tolerable.  She doesn't handle heat/humidity well. She denies any breast concerns.  Hypothyroidism: She reports compliance with her medications, on 41m23mShe denies any changes inhair/skin/nails,bowels,moods.She is "dry head to toe", but this is no different for her.    Lab Results  Component Value Date   TSH 2.330 03/08/2020   Psoriasis:Sheusesclobetasol prn. Skin clears for a few weeks after each of her epidural injections for her back.  She used to help control her psoriasis by using tanning bed, but none since she was diagnosed with cancer 2018. She sees dermatologist yearly for skin checks. She had a SCC in-situ removed from her chest in 05/2020. She has f/u with derm tomorrow.  GERD--She has been on dailyOmeprazole 40mg37me takes it at night, as her symptoms were mainly in the morning. She has had recurrence of reflux symptoms in the past on OTC dose of Nexium, and when not taking any PPI. She has been doing well on omeprazole, rarely misses a dose, and didn't have recurrent symptoms the last time.Denies any dysphagia.  She continues to have low back pain, had seenDr. NudelSherwood Gamblerhad gotten epidural injections, last in 07/2020 (is getting them every 3 months--also had in September, June 2021). Injectionsare helpful.  Not currently bothering her (2-3/10 pain). She has pain with prolonged standing. No longer radiates into the back of her legs. Pain is mostly at the base of her spine (and near sacrum), sometimes radiates to the left buttock/hip. This is unchanged.  August--saw JulieLeonie Green(at neurosurgery) for f/u from  her last injection. She had mentioned neck pain, so x-ray was done.  She has f/u with her next month (for her back, to set up next injection). Denies radiculopathy.  She has known CTS ,may eventually need surgery.  She has hand pain, denies  weakness. IMPRESSION: Severe multilevel degenerative disc disease. Mild bilateral neural foraminal stenosis is noted at C5-6 and C6-7. No acute abnormality seen in the cervical spine.  IFG: Tries to limit sweets, carbs.2-3 Oreo cookies with her coffee sometimes.Only an occasional sweet tea.Drinks diet soda. Lab Results  Component Value Date   HGBA1C 5.3 09/06/2019   H/o trochanteric bursitis: Hips haven't bothered her in a while. Last injections were 01/2017, bilateral.   Immunization History  Administered Date(s) Administered  . Fluad Quad(high Dose 65+) 06/18/2019, 06/05/2020  . Influenza Split 06/20/2011, 06/19/2012, 05/25/2014  . Influenza Whole 05/19/2000, 05/19/2001  . Influenza, High Dose Seasonal PF 05/23/2016, 06/04/2018  . Influenza,inj,Quad PF,6+ Mos 08/01/2017  . Influenza-Unspecified 05/19/2014, 05/24/2015  . Moderna Sars-Covid-2 Vaccination 09/24/2019, 10/23/2019, 08/03/2020  . Pneumococcal Conjugate-13 07/26/2013  . Pneumococcal Polysaccharide-23 06/16/2000, 07/27/2014  . Td 04/13/2001  . Tdap 12/31/2010  . Zoster 07/26/2013   Last Pap smear: 04/2019, gets every other year from [place] mammogram:02/2020 Last colonoscopy: 12/2017 Dr. Collene Mares, told 58yrf/u (if any)  Last DEXA: 02/2018, normal Dentist: every 6 months  Ophtho: yearly Exercise: Staying active at home--leg lifts, arm exercises, since she isn't walking as much due to the cold weather. She is doing this and keeping her heart rate up for 10 minutes.  She is using light weights.  She does these exercises daily. Vitamin D level was normal in 2012   Other doctors caring for patient include: Ophtho:Dr. CJorja Loa Dr. CMidge Averdid cataract surgery GYN: Dr. WStann MainlandGI: Dr. MCollene MaresDentist:Dr. Barrett Urologist: Dr. OKarsten RoOrtho: Dr. WNoemi ChapelSurgeon: Dr. TMarlou StarksNeurosurgeon: Dr. NSherwood Gambler(retired), now seeing Dr. EJeral Pinchfor pain management Oncologist: Dr. MJana HakimRadiation oncology: Dr. SIsidore MoosDerm:  Dr. JMartinique  Depression screen: negative Fall screen: none Functional Status screen: unremarkable.  Rare leakage of urine. Mini-Cog score of 5/5 Please see questionnaires in epic.  End of Life Discussion: Patient does not havea living will and medical power of attorney. She was given forms in the past, filled them out, but hasn't gotten them notarized yet.  They are still sitting at home.   PMH, PSH, SH and FH reviewed and updated  Outpatient Encounter Medications as of 09/11/2020  Medication Sig Note  . calcium-vitamin D (OSCAL WITH D) 500-200 MG-UNIT tablet Take 1 tablet by mouth daily.   . clobetasol cream (TEMOVATE) 0.05 % Apply topically 2 (two) times daily as needed.   . Coenzyme Q10 (CO Q-10) 100 MG CAPS Take by mouth daily.   .Arna Medici75 MCG tablet Take 1 tablet by mouth once daily   . hydrochlorothiazide (MICROZIDE) 12.5 MG capsule Take 1 capsule (12.5 mg total) by mouth daily.   .Marland Kitchenloratadine (CLARITIN) 10 MG tablet Take 10 mg by mouth daily as needed for allergies.    . Multiple Vitamins-Minerals (ALIVE WOMENS 50+ PO) Take 1 tablet by mouth daily.    .Marland Kitchenomeprazole (PRILOSEC) 40 MG capsule TAKE 1 CAPSULE BY MOUTH EVERY DAY   . rosuvastatin (CRESTOR) 10 MG tablet Take 1 tablet (10 mg total) by mouth daily. 09/11/2020: Takes every other day  . tamoxifen (NOLVADEX) 10 MG tablet Take 1 tablet (10 mg total) by mouth daily.   .Marland Kitchenvenlafaxine XR (EFFEXOR-XR) 37.5 MG  24 hr capsule TAKE 1 CAPSULE BY MOUTH ONCE DAILY WITH BREAKFAST   . acetaminophen (TYLENOL) 500 MG tablet Take 1,000 mg by mouth every 6 (six) hours as needed. (Patient not taking: Reported on 09/11/2020)   . fluticasone (FLONASE) 50 MCG/ACT nasal spray Place 2 sprays into both nostrils daily. (Patient not taking: Reported on 09/11/2020)   . [DISCONTINUED] azithromycin (ZITHROMAX) 250 MG tablet Take 2 tablets by mouth on first day, then 1 tablet by mouth on days 2 through 5    No facility-administered encounter  medications on file as of 09/11/2020.   Allergies  Allergen Reactions  . Adhesive [Tape] Other (See Comments)    SKIN TEARS  . Ciprofloxacin Nausea Only  . Codeine Nausea Only  . Iodine Swelling  . Polysporin [Bacitracin-Polymyxin B] Swelling    OPHTHALMIC - SWELLING OF EYES  . Penicillins Rash       . Sulfa Antibiotics Itching    ROS: The patient denies anorexia, fever, vision changes, decreased hearing, ear pain, sore throat, breast concerns, chest pain, palpitations, dizziness, syncope, dyspnea on exertion, swelling, nausea, vomiting, melena, hematochezia, hematuria, incontinence, dysuria, vaginal bleeding, discharge, odor or itch, genital lesions, numbness, tingling, weakness, tremor, suspicious skin lesions, depression, anxiety, abnormal bleeding/bruising, or enlarged lymph nodes. Hemorroids periodically flare (internal), no bleeding (s/p banding in the past) Low back pain per HPI.  No numbness, tingling, weakness.  Neck pain, mild and chronic. Carpal tunnel syndrome with bilateral hand pain.  Also has trigger fingers. Hot flashes have improved per HPI Psoriasis has been flaring just recently. She had foul-smelling gas for a while, improved when she cut back on lactose in her diet.   PHYSICAL EXAM:  BP 122/70   Pulse 84   Ht 5' 3.5" (1.613 m)   Wt 162 lb 3.2 oz (73.6 kg)   BMI 28.28 kg/m   Wt Readings from Last 3 Encounters:  09/11/20 162 lb 3.2 oz (73.6 kg)  06/15/20 161 lb (73 kg)  05/04/20 167 lb 12.8 oz (76.1 kg)   General Appearance:  Alert, cooperative, no distress, appears stated age. She declined changing into a gown  Head:  Normocephalic, without obvious abnormality, atraumatic   Eyes:  PERRL, conjunctiva/corneas clear, EOM's intact, fundi benign   Ears:  Normal TM's and external ear canals   Nose:  Not examined, wearing mask due to COVID-19 pandemic   Throat:  Not examined, wearing mask due to COVID-19 pandemic  Neck:  Supple, no  lymphadenopathy; thyroid: no enlargement/ tenderness/nodules; no carotid bruit or JVD   Back:  Spine nontender, no curvature, ROM normal, no CVA tenderness.   Lungs:  Clear to auscultation bilaterally without wheezes, rales or ronchi; respirations unlabored   Chest Wall:  No tenderness or deformity.   Heart:  Regular rate and rhythm, S1 and S2 normal, no murmur, rub or gallop   Breast Exam:  Deferred to GYN   Abdomen:  Soft, nondistended, normoactive bowel sounds, no masses, no hepatosplenomegaly.  Genitalia:  Deferred to GYN      Extremities:  No clubbing, cyanosis or edema. Nontender over bilateral trochanteric bursae  Pulses:  2+ and symmetric all extremities   Skin:  Normal turgor. Exam is limited due to not changing into gown. Small bruise at L forearm  Lymph nodes:  Cervical, supraclavicular, and axillary nodes normal   Neurologic:  CNII-XII intact, normal strength, sensation and gait; reflexes 2+ and symmetric throughout    Psych: Normal mood, affect, hygiene and grooming  ASSESSMENT/PLAN:  Medicare annual wellness visit, subsequent  Essential hypertension, benign - well controlled  Pure hypercholesterolemia - continue rosuvastatin at highest tolerated dose (every other day) - Plan: Lipid panel, rosuvastatin (CRESTOR) 10 MG tablet  Hypothyroidism, unspecified type - Plan: TSH  IFG (impaired fasting glucose) - continue low sugar/carb diet, discussed exercise - Plan: Glucose, random  Malignant neoplasm of upper-inner quadrant of right breast in female, estrogen receptor positive ([place]) - on tamoxifen (for 2 more years), under care of Dr. [name] (seen yearly in September)  Senile purpura ([place]) - borderline low plts, stable  Gastroesophageal reflux disease, unspecified whether esophagitis present - rec lowest effective dose of PPI. Risks of PPI and untreated reflux reviewed  Hot flashes due to tamoxifen -  tolerable on lower dose, and low dose effexor, continue  Medication monitoring encounter  Psoriasis - cont topical steroids prn. f/b derm  Low back pain, unspecified back pain laterality, unspecified chronicity, unspecified whether sciatica present - chronic, no sciatica. Controlled with injections per neurosurgeon's office  She had CBC and c-met via Onc in 04/2020.  Normal, except plt 137.   Discussed monthly self breast exams and yearly mammograms; at least 30 minutes of aerobic activity at least 5 days/week and weight-bearing exercise 2x/week; proper sunscreen use reviewed; healthy diet, including goals of calcium and vitamin D intake and alcohol recommendations (less than or equal to 1 drink/day) reviewed; regular seatbelt use; changing batteries in smoke detectors. Immunization recommendations discussed--continue high dose flu shots yearly. Tetanus booster will be due in May (to get from pharmacy). Shingrix is recommended; risks/SE reviewed, to get from pharmacy.Colonoscopy recommendations reviewed, UTD  MOST form reviewed and updated. Full Code, Full care. Reminded to get theLiving Will and healthcare POA notarized and to get [place] copies..  Chronic PPI use. Risks/benefits of treatment reviewed in detail. Continue current vitamins.  F/u 1 year, sooner prn or based on lab results.  Addendum: Lab Results  Component Value Date   TSH 6.110 (H) 09/11/2020   Unsure why higher--except that her thyroid seems to have changed to euthyrox.  Refilled same 66mg dose, asked for prior generic. Recheck 3 mos.   Medicare Attestation I have personally reviewed: The patient's medical and social history Their use of alcohol, tobacco or illicit drugs Their current medications and supplements The patient's functional ability including ADLs,fall risks, home safety risks, cognitive, and hearing and visual impairment Diet and physical activities Evidence for depression or mood disorders  The  patient's weight, height, BMI have been recorded in the chart.  I have made referrals, counseling, and provided education to the patient based on review of the above and I have provided the patient with a written personalized care plan for preventive services.

## 2020-09-10 NOTE — Patient Instructions (Addendum)
HEALTH MAINTENANCE RECOMMENDATIONS:  It is recommended that you get at least 30 minutes of aerobic exercise at least 5 days/week (for weight loss, you may need as much as 60-90 minutes). This can be any activity that gets your heart rate up. This can be divided in 10-15 minute intervals if needed, but try and build up your endurance at least once a week.  Weight bearing exercise is also recommended twice weekly.  Eat a healthy diet with lots of vegetables, fruits and fiber.  "Colorful" foods have a lot of vitamins (ie green vegetables, tomatoes, red peppers, etc).  Limit sweet tea, regular sodas and alcoholic beverages, all of which has a lot of calories and sugar.  Up to 1 alcoholic drink daily may be beneficial for women (unless trying to lose weight, watch sugars).  Drink a lot of water.  Calcium recommendations are 1200-1500 mg daily (1500 mg for postmenopausal women or women without ovaries), and vitamin D 1000 IU daily.  This should be obtained from diet and/or supplements (vitamins), and calcium should not be taken all at once, but in divided doses.  Monthly self breast exams and yearly mammograms for women over the age of 48 is recommended.  Sunscreen of at least SPF 30 should be used on all sun-exposed parts of the skin when outside between the hours of 10 am and 4 pm (not just when at beach or pool, but even with exercise, golf, tennis, and yard work!)  Use a sunscreen that says "broad spectrum" so it covers both UVA and UVB rays, and make sure to reapply every 1-2 hours.  Remember to change the batteries in your smoke detectors when changing your clock times in the spring and fall. Carbon monoxide detectors are recommended for your home.  Use your seat belt every time you are in a car, and please drive safely and not be distracted with cell phones and texting while driving.   Mckenzie Webb , Thank you for taking time to come for your Medicare Wellness Visit. I appreciate your ongoing  commitment to your health goals. Please review the following plan we discussed and let me know if I can assist you in the future.    This is a list of the screening recommended for you and due dates:  Health Maintenance  Topic Date Due  . COVID-19 Vaccine (3 - Moderna risk 4-dose series) 11/20/2019  . Tetanus Vaccine  12/30/2020  . Mammogram  02/27/2022  . Colon Cancer Screening  12/25/2027  . Flu Shot  Completed  . DEXA scan (bone density measurement)  Completed  .  Hepatitis C: One time screening is recommended by Center for Disease Control  (CDC) for  adults born from [birth year] through 1963.   Completed  . Pneumonia vaccines  Completed   Please get tetanus booster from the pharmacy in May (covered by Medicare Part D)  I recommend getting the new shingles vaccine (Shingrix). Since you have Medicare, you will need to get this from the pharmacy, as it is covered by Part D. This is a series of 2 injections, spaced 2 months apart. This should be given at least 2 weeks separate from any other vaccine.  We discussed trying to get to the lowest effective dose of the omeprazole.  You can try the over-the-counter 45m omeprazole, or even try every other day.  If any recurrent symptoms, go back to the dose that worked better for you.  Please get [place] of your living will and  healthcare power of attorney forms once they have been notarized.

## 2020-09-11 ENCOUNTER — Other Ambulatory Visit: Payer: Self-pay

## 2020-09-11 ENCOUNTER — Ambulatory Visit (INDEPENDENT_AMBULATORY_CARE_PROVIDER_SITE_OTHER): Payer: Medicare Other | Admitting: Family Medicine

## 2020-09-11 ENCOUNTER — Encounter: Payer: Self-pay | Admitting: Family Medicine

## 2020-09-11 VITALS — BP 122/70 | HR 84 | Ht 63.5 in | Wt 162.2 lb

## 2020-09-11 DIAGNOSIS — D692 Other nonthrombocytopenic purpura: Secondary | ICD-10-CM

## 2020-09-11 DIAGNOSIS — E039 Hypothyroidism, unspecified: Secondary | ICD-10-CM | POA: Diagnosis not present

## 2020-09-11 DIAGNOSIS — Z5181 Encounter for therapeutic drug level monitoring: Secondary | ICD-10-CM

## 2020-09-11 DIAGNOSIS — I10 Essential (primary) hypertension: Secondary | ICD-10-CM

## 2020-09-11 DIAGNOSIS — C50211 Malignant neoplasm of upper-inner quadrant of right female breast: Secondary | ICD-10-CM

## 2020-09-11 DIAGNOSIS — Z0000 Encounter for general adult medical examination without abnormal findings: Secondary | ICD-10-CM | POA: Diagnosis not present

## 2020-09-11 DIAGNOSIS — R7301 Impaired fasting glucose: Secondary | ICD-10-CM | POA: Diagnosis not present

## 2020-09-11 DIAGNOSIS — Z170 Estrogen receptor positive status [ER+]: Secondary | ICD-10-CM | POA: Diagnosis not present

## 2020-09-11 DIAGNOSIS — R232 Flushing: Secondary | ICD-10-CM | POA: Diagnosis not present

## 2020-09-11 DIAGNOSIS — M545 Low back pain, unspecified: Secondary | ICD-10-CM | POA: Diagnosis not present

## 2020-09-11 DIAGNOSIS — E7800 Pure hypercholesterolemia, unspecified: Secondary | ICD-10-CM | POA: Diagnosis not present

## 2020-09-11 DIAGNOSIS — K219 Gastro-esophageal reflux disease without esophagitis: Secondary | ICD-10-CM | POA: Diagnosis not present

## 2020-09-11 DIAGNOSIS — L409 Psoriasis, unspecified: Secondary | ICD-10-CM | POA: Diagnosis not present

## 2020-09-11 DIAGNOSIS — T451X5A Adverse effect of antineoplastic and immunosuppressive drugs, initial encounter: Secondary | ICD-10-CM

## 2020-09-11 MED ORDER — ROSUVASTATIN CALCIUM 10 MG PO TABS: 10.0000 mg | ORAL_TABLET | ORAL | 0 refills | Status: DC

## 2020-09-12 DIAGNOSIS — B078 Other viral warts: Secondary | ICD-10-CM | POA: Diagnosis not present

## 2020-09-12 DIAGNOSIS — Z85828 Personal history of other malignant neoplasm of skin: Secondary | ICD-10-CM | POA: Diagnosis not present

## 2020-09-12 LAB — LIPID PANEL
Chol/HDL Ratio: 3.7 ratio (ref 0.0–4.4)
Cholesterol, Total: 193 mg/dL (ref 100–199)
HDL: 52 mg/dL (ref >39)
LDL Chol Calc (NIH): 106 mg/dL — ABNORMAL HIGH (ref 0–99)
Triglycerides: 203 mg/dL — ABNORMAL HIGH (ref 0–149)
VLDL Cholesterol Cal: 35 mg/dL (ref 5–40)

## 2020-09-12 LAB — TSH: TSH: 6.11 u[IU]/mL — ABNORMAL HIGH (ref 0.450–4.500)

## 2020-09-12 LAB — GLUCOSE, RANDOM: Glucose: 107 mg/dL — ABNORMAL HIGH (ref 65–99)

## 2020-09-12 MED ORDER — LEVOTHYROXINE SODIUM 75 MCG PO TABS: 75.0000 ug | ORAL_TABLET | Freq: Every day | ORAL | 0 refills | Status: DC

## 2020-09-14 ENCOUNTER — Encounter: Payer: Self-pay | Admitting: Family Medicine

## 2020-09-18 ENCOUNTER — Other Ambulatory Visit: Payer: Self-pay

## 2020-09-18 DIAGNOSIS — E039 Hypothyroidism, unspecified: Secondary | ICD-10-CM

## 2020-09-18 DIAGNOSIS — R7301 Impaired fasting glucose: Secondary | ICD-10-CM

## 2020-09-20 DIAGNOSIS — M5030 Other cervical disc degeneration, unspecified cervical region: Secondary | ICD-10-CM | POA: Diagnosis not present

## 2020-09-25 ENCOUNTER — Other Ambulatory Visit: Payer: Self-pay | Admitting: *Deleted

## 2020-09-25 DIAGNOSIS — R7301 Impaired fasting glucose: Secondary | ICD-10-CM

## 2020-09-25 DIAGNOSIS — E039 Hypothyroidism, unspecified: Secondary | ICD-10-CM

## 2020-10-02 DIAGNOSIS — G5603 Carpal tunnel syndrome, bilateral upper limbs: Secondary | ICD-10-CM | POA: Diagnosis not present

## 2020-10-02 DIAGNOSIS — Z6828 Body mass index (BMI) 28.0-28.9, adult: Secondary | ICD-10-CM | POA: Diagnosis not present

## 2020-10-11 ENCOUNTER — Other Ambulatory Visit: Payer: Self-pay | Admitting: Family Medicine

## 2020-10-11 DIAGNOSIS — E7800 Pure hypercholesterolemia, unspecified: Secondary | ICD-10-CM

## 2020-10-11 DIAGNOSIS — K219 Gastro-esophageal reflux disease without esophagitis: Secondary | ICD-10-CM

## 2020-10-13 DIAGNOSIS — G5603 Carpal tunnel syndrome, bilateral upper limbs: Secondary | ICD-10-CM | POA: Diagnosis not present

## 2020-10-13 DIAGNOSIS — M48062 Spinal stenosis, lumbar region with neurogenic claudication: Secondary | ICD-10-CM | POA: Diagnosis not present

## 2020-10-13 DIAGNOSIS — M5136 Other intervertebral disc degeneration, lumbar region: Secondary | ICD-10-CM | POA: Diagnosis not present

## 2020-10-18 DIAGNOSIS — B078 Other viral warts: Secondary | ICD-10-CM | POA: Diagnosis not present

## 2020-10-26 DIAGNOSIS — G5603 Carpal tunnel syndrome, bilateral upper limbs: Secondary | ICD-10-CM | POA: Diagnosis not present

## 2020-10-30 ENCOUNTER — Other Ambulatory Visit: Payer: Self-pay | Admitting: Family Medicine

## 2020-10-30 DIAGNOSIS — I10 Essential (primary) hypertension: Secondary | ICD-10-CM

## 2020-11-03 ENCOUNTER — Other Ambulatory Visit: Payer: Self-pay | Admitting: Oncology

## 2020-11-27 DIAGNOSIS — M48062 Spinal stenosis, lumbar region with neurogenic claudication: Secondary | ICD-10-CM | POA: Diagnosis not present

## 2020-12-14 ENCOUNTER — Other Ambulatory Visit: Payer: Self-pay

## 2020-12-14 ENCOUNTER — Other Ambulatory Visit: Payer: Medicare Other

## 2020-12-14 DIAGNOSIS — E039 Hypothyroidism, unspecified: Secondary | ICD-10-CM | POA: Diagnosis not present

## 2020-12-14 DIAGNOSIS — R7301 Impaired fasting glucose: Secondary | ICD-10-CM

## 2020-12-15 LAB — TSH: TSH: 3.46 u[IU]/mL (ref 0.450–4.500)

## 2020-12-15 LAB — GLUCOSE, RANDOM: Glucose: 95 mg/dL (ref 65–99)

## 2020-12-21 DIAGNOSIS — G5603 Carpal tunnel syndrome, bilateral upper limbs: Secondary | ICD-10-CM | POA: Diagnosis not present

## 2020-12-21 DIAGNOSIS — M48062 Spinal stenosis, lumbar region with neurogenic claudication: Secondary | ICD-10-CM | POA: Diagnosis not present

## 2020-12-21 DIAGNOSIS — M5416 Radiculopathy, lumbar region: Secondary | ICD-10-CM | POA: Diagnosis not present

## 2020-12-21 DIAGNOSIS — M5030 Other cervical disc degeneration, unspecified cervical region: Secondary | ICD-10-CM | POA: Diagnosis not present

## 2020-12-21 DIAGNOSIS — M65331 Trigger finger, right middle finger: Secondary | ICD-10-CM | POA: Diagnosis not present

## 2020-12-26 DIAGNOSIS — R194 Change in bowel habit: Secondary | ICD-10-CM | POA: Diagnosis not present

## 2020-12-26 DIAGNOSIS — K219 Gastro-esophageal reflux disease without esophagitis: Secondary | ICD-10-CM | POA: Diagnosis not present

## 2020-12-26 DIAGNOSIS — R140 Abdominal distension (gaseous): Secondary | ICD-10-CM | POA: Diagnosis not present

## 2020-12-26 DIAGNOSIS — R197 Diarrhea, unspecified: Secondary | ICD-10-CM | POA: Diagnosis not present

## 2020-12-28 ENCOUNTER — Other Ambulatory Visit: Payer: Self-pay | Admitting: Family Medicine

## 2020-12-28 DIAGNOSIS — E039 Hypothyroidism, unspecified: Secondary | ICD-10-CM

## 2021-01-05 DIAGNOSIS — M65331 Trigger finger, right middle finger: Secondary | ICD-10-CM | POA: Diagnosis not present

## 2021-01-05 DIAGNOSIS — G5601 Carpal tunnel syndrome, right upper limb: Secondary | ICD-10-CM | POA: Diagnosis not present

## 2021-01-05 HISTORY — PX: OTHER SURGICAL HISTORY: SHX169

## 2021-01-05 HISTORY — PX: CARPAL TUNNEL RELEASE: SHX101

## 2021-01-09 DIAGNOSIS — R197 Diarrhea, unspecified: Secondary | ICD-10-CM | POA: Diagnosis not present

## 2021-01-09 DIAGNOSIS — R1032 Left lower quadrant pain: Secondary | ICD-10-CM | POA: Diagnosis not present

## 2021-01-09 DIAGNOSIS — K219 Gastro-esophageal reflux disease without esophagitis: Secondary | ICD-10-CM | POA: Diagnosis not present

## 2021-01-09 DIAGNOSIS — R194 Change in bowel habit: Secondary | ICD-10-CM | POA: Diagnosis not present

## 2021-01-17 ENCOUNTER — Encounter: Payer: Self-pay | Admitting: *Deleted

## 2021-01-19 DIAGNOSIS — M25641 Stiffness of right hand, not elsewhere classified: Secondary | ICD-10-CM | POA: Diagnosis not present

## 2021-01-22 ENCOUNTER — Other Ambulatory Visit: Payer: Self-pay | Admitting: Family Medicine

## 2021-01-22 DIAGNOSIS — I10 Essential (primary) hypertension: Secondary | ICD-10-CM

## 2021-01-23 ENCOUNTER — Other Ambulatory Visit: Payer: Self-pay | Admitting: Oncology

## 2021-01-23 DIAGNOSIS — Z9889 Other specified postprocedural states: Secondary | ICD-10-CM

## 2021-01-26 DIAGNOSIS — M25641 Stiffness of right hand, not elsewhere classified: Secondary | ICD-10-CM | POA: Diagnosis not present

## 2021-02-07 ENCOUNTER — Other Ambulatory Visit: Payer: Self-pay | Admitting: Oncology

## 2021-02-14 ENCOUNTER — Encounter: Payer: Self-pay | Admitting: Family Medicine

## 2021-02-27 DIAGNOSIS — M5416 Radiculopathy, lumbar region: Secondary | ICD-10-CM | POA: Diagnosis not present

## 2021-03-01 ENCOUNTER — Ambulatory Visit
Admission: RE | Admit: 2021-03-01 | Discharge: 2021-03-01 | Disposition: A | Discharge status: Home / Self Care | Payer: Medicare Other | Source: Ambulatory Visit | Attending: Oncology | Admitting: Oncology

## 2021-03-01 ENCOUNTER — Other Ambulatory Visit: Payer: Self-pay

## 2021-03-01 DIAGNOSIS — R922 Inconclusive mammogram: Secondary | ICD-10-CM | POA: Diagnosis not present

## 2021-03-01 DIAGNOSIS — Z9889 Other specified postprocedural states: Secondary | ICD-10-CM

## 2021-03-01 LAB — HM MAMMOGRAPHY

## 2021-03-29 DIAGNOSIS — M5136 Other intervertebral disc degeneration, lumbar region: Secondary | ICD-10-CM | POA: Diagnosis not present

## 2021-03-29 DIAGNOSIS — M48062 Spinal stenosis, lumbar region with neurogenic claudication: Secondary | ICD-10-CM | POA: Diagnosis not present

## 2021-03-29 DIAGNOSIS — M5416 Radiculopathy, lumbar region: Secondary | ICD-10-CM | POA: Diagnosis not present

## 2021-04-02 ENCOUNTER — Other Ambulatory Visit: Payer: Self-pay | Admitting: Family Medicine

## 2021-04-02 DIAGNOSIS — E7800 Pure hypercholesterolemia, unspecified: Secondary | ICD-10-CM

## 2021-04-30 DIAGNOSIS — H353132 Nonexudative age-related macular degeneration, bilateral, intermediate dry stage: Secondary | ICD-10-CM | POA: Diagnosis not present

## 2021-05-03 DIAGNOSIS — Z124 Encounter for screening for malignant neoplasm of cervix: Secondary | ICD-10-CM | POA: Diagnosis not present

## 2021-05-03 DIAGNOSIS — Z6826 Body mass index (BMI) 26.0-26.9, adult: Secondary | ICD-10-CM | POA: Diagnosis not present

## 2021-05-03 LAB — HM PAP SMEAR: HM Pap smear: NEGATIVE

## 2021-05-06 NOTE — Progress Notes (Signed)
Mckenzie Webb  [facility]  [phone] [phone]     ID: Mckenzie Webb DOB: [birth year]  MR#: [id number]  IOX#:[id number]  Patient Care Team: Rita Ohara, MD as PCP - General (Family Medicine) Jovita Kussmaul, MD as Consulting Physician (General Surgery) Broderick Fonseca, Virgie Dad, MD as Consulting Physician (Oncology) Eppie Gibson, MD as Attending Physician (Radiation Oncology) Juanita Craver, MD as Consulting Physician (Gastroenterology) Vania Rea, MD as Consulting Physician (Obstetrics and Gynecology) Elsie Saas, MD as Consulting Physician (Orthopedic Surgery) Martinique, Amy, MD as Consulting Physician (Dermatology) Delice Bison, Charlestine Massed, NP as Nurse Practitioner (Hematology and Oncology) OTHER MD:   CHIEF COMPLAINT: Estrogen receptor positive breast cancer  CURRENT TREATMENT: tamoxifen (at 10 mg/d)   INTERVAL HISTORY: Mckenzie Webb returns today for follow-up of her estrogen receptor positive breast cancer.   She continues on tamoxifen.  She had a great deal of difficulty tolerating the 20 mg dose but does better on 10 mg.  It also helped when he added the venlafaxine.  Despite that she still has significant issues with hot flashes primarily.  Since her last visit, she underwent bilateral diagnostic mammography with tomography at [facility] on 03/01/2021 showing: breast density category B; no evidence of malignancy in either breast.    REVIEW OF SYSTEMS: Mckenzie Webb generally feels fine.  She exercises by walking and doing yard work and doing leg lifts.  She tells me Dr. Martinique removed a mole from her chest which she says was not a melanoma.  She also had right carpal tunnel surgery under Dr. Amedeo Plenty.  That was successful   COVID 19 VACCINATION STATUS: Moderna x3, most recently 07/2020   BREAST CANCER HISTORY: From the original intake note:  "Mckenzie Webb" had bilateral screening mammography with tomography at the [facility] 02/21/2017. This showed  a possible mass in the right breast. Right diagnostic mammography with tomography and ultrasonography 02/27/2017 found a breast density to be category B. In the subareolar right breast there was a 0.5 cm spiculated mass, which was not palpable. There was mild right nipple inversion, which per report was chronic. Ultrasonography confirmed a 0.5 cm retroareolar breast mass at the 1:00 radiant. The right axilla was sonographically benign.  On 03/03/2017 the patient underwent biopsy of the right breast mass in question. This showed ([id number]) and invasive ductal carcinoma, grade 1, estrogen receptor 100% positive, progesterone receptor negative, with an MIB-1 of 3%, and no HER-2 implication, the signals ratio being 1.25 and the number per cell 2.00.  The patient's subsequent history is as detailed below.   PAST MEDICAL HISTORY: Past Medical History:  Diagnosis Date   Arthritis    lower back   Cough 07/22/2017   Dental crowns present    Family history of adverse reaction to anesthesia    pt's sister has hx. of post-op N/V   GERD (gastroesophageal reflux disease)    History of chemotherapy    finished chemo 06/30/2017   History of radiation therapy 07/29/17- 08/28/17   Right Breast and Axilla treated to 42.56 Gy with 16 fx of 2.66. Boost of 8 Gy with 4 fx of 2 Gy.    History of right breast cancer 02/2017   Hyperlipidemia    Hypothyroid    Leukopenia 07/07/2017   Personal history of chemotherapy    Personal history of radiation therapy    Stuffy and runny nose 07/22/2017   clear drainage from nose, per pt.    PAST SURGICAL HISTORY: Past Surgical History:  Procedure Laterality  Date   BREAST LUMPECTOMY Right 03/26/2017   BREAST LUMPECTOMY WITH RADIOACTIVE SEED AND SENTINEL LYMPH NODE BIOPSY Right 03/26/2017   Procedure: BREAST LUMPECTOMY WITH RADIOACTIVE SEED AND SENTINEL LYMPH NODE BIOPSY;  Surgeon: Jovita Kussmaul, MD;  Location: [facility];  Service: General;   Laterality: Right;   CARPAL TUNNEL RELEASE Right 01/05/2021   R CTR with RMF trigger release (Dr. Amedeo Plenty)   CATARACT EXTRACTION Left 2021   CHOLECYSTECTOMY     COLONOSCOPY  10/2007   CYST EXCISION  02/01/2003   knee   KNEE ARTHROSCOPY Right 1990   KNEE ARTHROSCOPY Left 12/12/2010   PORT-A-CATH REMOVAL N/A 09/03/2017   Procedure: REMOVAL PORT-A-CATH;  Surgeon: Jovita Kussmaul, MD;  Location: [facility];  Service: General;  Laterality: N/A;   PORTACATH PLACEMENT Left 04/24/2017   Procedure: INSERTION PORT-A-CATH;  Surgeon: Jovita Kussmaul, MD;  Location: [facility];  Service: General;  Laterality: Left;   [place]  09/2009 R, 06/2010 L    FAMILY HISTORY Family History  Problem Relation Age of Onset   Hypertension Mother    Heart disease Mother    Gallbladder disease Mother    Thyroid disease Mother    Arthritis Mother    Diabetes Father    Cancer Father        pancreatic   Hypertension Sister    Gallbladder disease Sister    Diabetes Sister    Diverticulitis Sister        of small intestine   Cancer Son        testicular cancer   Stroke Sister        late 50's   Hypertension Sister    Pulmonary embolism Sister    Ulcerative colitis Sister    COPD Brother        had lung lobe removed for suspected cancer, but was benign; smoker   Colon cancer Other 28   Lung cancer Cousin        maternal first cousin - smoker  The patient's father died from pancreatic cancer at the age of 1. The patient's mother died from a myocardial infarction at age 42. The patient had one brother, 3 sisters. The patient's older son was diagnosed with testicular cancer at age 47. A maternal niece was diagnosed with colon cancer at age 65 and a maternal cousin with lung cancer at an unknown age.   GYNECOLOGIC HISTORY:  No LMP recorded. Patient is postmenopausal.  Menarche age 69, first live birth age 6, the patient is [place] P2. she went through the change of  life approximately the year 2000. She did not take hormone replacement. She did take oral contraceptives for more than 20 years remotely, with no complications.   SOCIAL HISTORY:  She is a retired Web designer. Her husband Arnette Norris is an Clinical biochemist, still working part-time. Son Corene Cornea (from the patient's first marriage) lives in [place] and works in Therapist, art. Son Catalina Antigua (from patient second marriage) lives in [place] we are is a Administrator, arts. The patient has one grandchild. She is not a Ambulance person.    ADVANCED DIRECTIVES: In the absence of any documentation to the contrary, the patient's spouse is their HCPOA.    HEALTH MAINTENANCE: Social History   Tobacco Use   Smoking status: Never   Smokeless tobacco: Never  Vaping Use   Vaping Use: Never used  Substance Use Topics   Alcohol use: Not Currently  Comment: none since her cancer diagnosis 2018   Drug use: No     Colonoscopy: April 2019/ Dr. Mann/ normal  PAP:  Bone density: 12/27/2014 at the [facility], T score -0.1 (normal)   Allergies  Allergen Reactions   Adhesive [Tape] Other (See Comments)    SKIN TEARS   Ciprofloxacin Nausea Only   Codeine Nausea Only   Iodine Swelling   Polysporin [Bacitracin-Polymyxin B] Swelling    OPHTHALMIC - SWELLING OF EYES   Penicillins Rash        Sulfa Antibiotics Itching    Current Outpatient Medications  Medication Sig Dispense Refill   acetaminophen (TYLENOL) 500 MG tablet Take 1,000 mg by mouth every 6 (six) hours as needed. (Patient not taking: Reported on 09/11/2020)     calcium-vitamin D (OSCAL WITH D) 500-200 MG-UNIT tablet Take 1 tablet by mouth daily.     clobetasol cream (TEMOVATE) 0.05 % Apply topically 2 (two) times daily as needed. 60 g 0   Coenzyme Q10 (CO Q-10) 100 MG CAPS Take by mouth daily.     fluticasone (FLONASE) 50 MCG/ACT nasal spray Place 2 sprays into both nostrils daily. (Patient not taking: Reported on 09/11/2020) 16 g 6    hydrochlorothiazide (MICROZIDE) 12.5 MG capsule Take 1 capsule by mouth once daily 90 capsule 1   levothyroxine (SYNTHROID) 75 MCG tablet Take 1 tablet by mouth once daily 90 tablet 1   loratadine (CLARITIN) 10 MG tablet Take 10 mg by mouth daily as needed for allergies.      Multiple Vitamins-Minerals (ALIVE WOMENS 50+ PO) Take 1 tablet by mouth daily.      omeprazole (PRILOSEC) 40 MG capsule Take 1 capsule by mouth once daily 90 capsule 0   rosuvastatin (CRESTOR) 10 MG tablet Take 1 tablet by mouth once daily 90 tablet 1   tamoxifen (NOLVADEX) 10 MG tablet Take 1 tablet (10 mg total) by mouth daily. 90 tablet 4   venlafaxine XR (EFFEXOR-XR) 37.5 MG 24 hr capsule TAKE 1 CAPSULE BY MOUTH ONCE DAILY WITH BREAKFAST 90 capsule 0   No current facility-administered medications for this visit.    OBJECTIVE: White woman who appears stated age  89:   05/07/21 1455  BP: 138/73  Pulse: 66  Resp: 14  Temp: (!) 97.2 F (36.2 C)  SpO2: 100%    Wt Readings from Last 3 Encounters:  05/07/21 163 lb 6.4 oz (74.1 kg)  09/11/20 162 lb 3.2 oz (73.6 kg)  06/15/20 161 lb (73 kg)   Body mass index is 28.49 kg/m.    ECOG FS:1 - Symptomatic but completely ambulatory  Sclerae unicteric, EOMs intact Wearing a mask No cervical or supraclavicular adenopathy Lungs no rales or rhonchi Heart regular rate and rhythm Abd soft, nontender, positive bowel sounds MSK no focal spinal tenderness, no upper extremity lymphedema Neuro: nonfocal, well oriented, appropriate affect Breasts: The right breast is status postlumpectomy and radiation.  The cosmetic result is good.  In the axilla medially 1 can feel a 1 and half centimeter indurated area which is movable nontender and not woody hard.  This is stable.  The right nipple is lighter colored than the left.  There is no discharge today.  Left breast and axilla are benign   LAB RESULTS:  CMP     Component Value Date/Time   NA 140 05/04/2020 1430   NA  143 09/06/2019 0947   NA 142 08/01/2017 1056   K 4.1 05/04/2020 1430   K 4.4 08/01/2017  1056   CL 105 05/04/2020 1430   CO2 29 05/04/2020 1430   CO2 24 08/01/2017 1056   GLUCOSE 95 12/14/2020 0854   GLUCOSE 98 05/04/2020 1430   GLUCOSE 101 08/01/2017 1056   BUN 12 05/04/2020 1430   BUN 13 09/06/2019 0947   BUN 11.1 08/01/2017 1056   CREATININE 0.77 05/04/2020 1430   CREATININE 0.8 08/01/2017 1056   CALCIUM 9.0 05/04/2020 1430   CALCIUM 9.6 08/01/2017 1056   PROT 6.7 05/04/2020 1430   PROT 6.5 09/06/2019 0947   PROT 6.6 08/01/2017 1056   ALBUMIN 3.5 05/04/2020 1430   ALBUMIN 3.8 09/06/2019 0947   ALBUMIN 3.6 08/01/2017 1056   AST 28 05/04/2020 1430   AST 18 08/01/2017 1056   ALT 20 05/04/2020 1430   ALT 17 08/01/2017 1056   ALKPHOS 50 05/04/2020 1430   ALKPHOS 67 08/01/2017 1056   BILITOT 0.3 05/04/2020 1430   BILITOT 0.4 09/06/2019 0947   BILITOT 0.38 08/01/2017 1056   GFRNONAA >60 05/04/2020 1430   GFRAA >60 05/04/2020 1430    No results found for: TOTALPROTELP, ALBUMINELP, A1GS, A2GS, BETS, BETA2SER, GAMS, MSPIKE, SPEI  No results found for: Nils Pyle, [facility]  Lab Results  Component Value Date   WBC 4.2 05/07/2021   NEUTROABS 2.3 05/07/2021   HGB 12.2 05/07/2021   HCT 36.0 05/07/2021   MCV 95.0 05/07/2021   PLT 137 (L) 05/07/2021      Chemistry      Component Value Date/Time   NA 140 05/04/2020 1430   NA 143 09/06/2019 0947   NA 142 08/01/2017 1056   K 4.1 05/04/2020 1430   K 4.4 08/01/2017 1056   CL 105 05/04/2020 1430   CO2 29 05/04/2020 1430   CO2 24 08/01/2017 1056   BUN 12 05/04/2020 1430   BUN 13 09/06/2019 0947   BUN 11.1 08/01/2017 1056   CREATININE 0.77 05/04/2020 1430   CREATININE 0.8 08/01/2017 1056      Component Value Date/Time   CALCIUM 9.0 05/04/2020 1430   CALCIUM 9.6 08/01/2017 1056   ALKPHOS 50 05/04/2020 1430   ALKPHOS 67 08/01/2017 1056   AST 28 05/04/2020 1430   AST 18 08/01/2017 1056   ALT 20  05/04/2020 1430   ALT 17 08/01/2017 1056   BILITOT 0.3 05/04/2020 1430   BILITOT 0.4 09/06/2019 0947   BILITOT 0.38 08/01/2017 1056       No results found for: LABCA2  No components found for: [id number]  No results for input(s): INR in the last 168 hours.  Urinalysis    Component Value Date/Time   COLORURINE YELLOW 05/02/2017 2201   APPEARANCEUR HAZY (A) 05/02/2017 2201   LABSPEC 1.015 10/21/2018 1107   PHURINE 6.0 05/02/2017 2201   GLUCOSEU NEGATIVE 05/02/2017 2201   HGBUR SMALL (A) 05/02/2017 2201   BILIRUBINUR negative 10/21/2018 1107   BILIRUBINUR neg 08/17/2014 1123   KETONESUR negative 10/21/2018 1107   KETONESUR NEGATIVE 05/02/2017 2201   PROTEINUR negative 10/21/2018 1107   PROTEINUR NEGATIVE 05/02/2017 2201   UROBILINOGEN negative 08/17/2014 1123   NITRITE Negative 10/21/2018 1107   NITRITE POSITIVE (A) 05/02/2017 2201   LEUKOCYTESUR Negative 10/21/2018 1107    STUDIES: No results found.   ELIGIBLE FOR AVAILABLE RESEARCH PROTOCOL: no  ASSESSMENT: 75 y.o. [place] woman status post right breast upper inner quadrant biopsy 03/04/2015 for a clinical T1a N0, stage IA invasive ductal carcinoma, grade 1, estrogen receptor positive, progesterone receptor negative, with an MIB-1 of 3% and no  HER-2 amplification  (1) anastrozole started neoadjuvantly 03/12/2017, held at the start of chemotherapy, resumed late November 2018  (2) genetics testing 0 03/2017 through the  common hereditary cancer panel offered by [facility] found no deleterious mutations in APC, ATM, AXIN2, BARD1, BMPR1A, BRCA1, BRCA2, BRIP1, CDH1, CDKN2A (p14ARF), CDKN2A (p16INK4a), CHEK2, CTNNA1, DICER1, EPCAM (Deletion/duplication testing only), GREM1 (promoter region deletion/duplication testing only), KIT, MEN1, MLH1, MSH2, MSH3, MSH6, MUTYH, NBN, NF1, NHTL1, PALB2, PDGFRA, PMS2, POLD1, POLE, PTEN, RAD50, RAD51C, RAD51D, SDHB, SDHC, SDHD, SMAD4, SMARCA4. STK11, TP53, TSC1, TSC2, and VHL.  The following  genes were evaluated for sequence changes only: SDHA and HOXB13 c.251G>A variant only.     (3) right lumpectomy and sentinel lymph node biopsy 03/26/2017 showed a pT1c pN1, stage IB invasive ductal carcinoma, grade 1, with negative margins  (4) Mammaprint returned high risk, indicating need for adjuvant chemotherapy  (5) adjuvant chemotherapy consisting of Cytoxan and docetaxel given every 3 weeks 4 beginning on 04/28/2017  (a) changed to Doxorubicin and Cyclophosphamide beginning 05/19/2017, completing 3 cycles 06/30/2017  (b) dose reduced by 20% with the second and third CA treatments  (6) Adjuvant radiation completed 08/28/2017 Site/dose:   1) Right breast and axilla treated to 42.56 Gy with 16 fx of 2.66  2)  boost of 8 Gy with 4 fx of 2 Gy  (7) Anastrozole started in 06/2017  (a) DEXA scan 02/24/2018 showed a T score of -0.6 normal  (b) anastrozole stopped 04/09/2018--symptoms  (c) tamoxifen started September 2019  (d) tamoxifen dose decreased to 10 mg daily as of September 2020   PLAN: Baker Janus is now just over 4 years out from definitive surgery for her breast cancer with no evidence of disease recurrence.  This is very favorable.  She is tolerating anastrozole well and she will complete 5 years of October 2023.  We will see her at that time and she will likely "graduate" from follow-up here  She knows to call for any other issue that may develop before the next visit  Total encounter time 20 minutes.*    Aunya Lemler, Virgie Dad, MD  05/07/21 2:59 PM Medical Oncology and Hematology [facility] [place], [place] [address] Tel. [phone]    Fax. [phone]   I, Wilburn Mylar, am acting as scribe for Dr. Virgie Dad. Tachina Spoonemore.  I, Lurline Del MD, have reviewed the above documentation for accuracy and completeness, and I agree with the above.   *Total Encounter Time as defined by the Centers for Medicare and Medicaid Services includes, in  addition to the face-to-face time of a patient visit (documented in the note above) non-face-to-face time: obtaining and reviewing outside history, ordering and reviewing medications, tests or procedures, care coordination (communications with other health care professionals or caregivers) and documentation in the medical record.

## 2021-05-07 ENCOUNTER — Other Ambulatory Visit: Payer: Self-pay

## 2021-05-07 ENCOUNTER — Inpatient Hospital Stay: Payer: Medicare Other | Attending: Oncology | Admitting: Oncology

## 2021-05-07 ENCOUNTER — Other Ambulatory Visit: Payer: Self-pay | Admitting: *Deleted

## 2021-05-07 ENCOUNTER — Inpatient Hospital Stay: Payer: Medicare Other

## 2021-05-07 VITALS — BP 138/73 | HR 66 | Temp 97.2°F | Resp 14 | Ht 63.5 in | Wt 163.4 lb

## 2021-05-07 DIAGNOSIS — Z79899 Other long term (current) drug therapy: Secondary | ICD-10-CM | POA: Insufficient documentation

## 2021-05-07 DIAGNOSIS — E785 Hyperlipidemia, unspecified: Secondary | ICD-10-CM | POA: Diagnosis not present

## 2021-05-07 DIAGNOSIS — Z9221 Personal history of antineoplastic chemotherapy: Secondary | ICD-10-CM | POA: Diagnosis not present

## 2021-05-07 DIAGNOSIS — R232 Flushing: Secondary | ICD-10-CM | POA: Diagnosis not present

## 2021-05-07 DIAGNOSIS — Z923 Personal history of irradiation: Secondary | ICD-10-CM | POA: Insufficient documentation

## 2021-05-07 DIAGNOSIS — M1990 Unspecified osteoarthritis, unspecified site: Secondary | ICD-10-CM | POA: Insufficient documentation

## 2021-05-07 DIAGNOSIS — K219 Gastro-esophageal reflux disease without esophagitis: Secondary | ICD-10-CM | POA: Diagnosis not present

## 2021-05-07 DIAGNOSIS — Z79810 Long term (current) use of selective estrogen receptor modulators (SERMs): Secondary | ICD-10-CM | POA: Insufficient documentation

## 2021-05-07 DIAGNOSIS — C50211 Malignant neoplasm of upper-inner quadrant of right female breast: Secondary | ICD-10-CM | POA: Diagnosis not present

## 2021-05-07 DIAGNOSIS — Z170 Estrogen receptor positive status [ER+]: Secondary | ICD-10-CM | POA: Diagnosis not present

## 2021-05-07 DIAGNOSIS — E039 Hypothyroidism, unspecified: Secondary | ICD-10-CM | POA: Insufficient documentation

## 2021-05-07 LAB — CBC WITH DIFFERENTIAL (CANCER CENTER ONLY)
Abs Immature Granulocytes: 0.01 10*3/uL (ref 0.00–0.07)
Basophils Absolute: 0 10*3/uL (ref 0.0–0.1)
Basophils Relative: 1 %
Eosinophils Absolute: 0.1 10*3/uL (ref 0.0–0.5)
Eosinophils Relative: 2 %
HCT: 36 % (ref 36.0–46.0)
Hemoglobin: 12.2 g/dL (ref 12.0–15.0)
Immature Granulocytes: 0 %
Lymphocytes Relative: 33 %
Lymphs Abs: 1.4 10*3/uL (ref 0.7–4.0)
MCH: 32.2 pg (ref 26.0–34.0)
MCHC: 33.9 g/dL (ref 30.0–36.0)
MCV: 95 fL (ref 80.0–100.0)
Monocytes Absolute: 0.4 10*3/uL (ref 0.1–1.0)
Monocytes Relative: 10 %
Neutro Abs: 2.3 10*3/uL (ref 1.7–7.7)
Neutrophils Relative %: 54 %
Platelet Count: 137 10*3/uL — ABNORMAL LOW (ref 150–400)
RBC: 3.79 MIL/uL — ABNORMAL LOW (ref 3.87–5.11)
RDW: 12.8 % (ref 11.5–15.5)
WBC Count: 4.2 10*3/uL (ref 4.0–10.5)
nRBC: 0 % (ref 0.0–0.2)

## 2021-05-07 LAB — CMP (CANCER CENTER ONLY)
ALT: 18 U/L (ref 0–44)
AST: 22 U/L (ref 15–41)
Albumin: 3.6 g/dL (ref 3.5–5.0)
Alkaline Phosphatase: 49 U/L (ref 38–126)
Anion gap: 9 (ref 5–15)
BUN: 13 mg/dL (ref 8–23)
CO2: 26 mmol/L (ref 22–32)
Calcium: 8.9 mg/dL (ref 8.9–10.3)
Chloride: 107 mmol/L (ref 98–111)
Creatinine: 0.84 mg/dL (ref 0.44–1.00)
GFR, Estimated: >60 mL/min (ref >60)
Glucose, Bld: 108 mg/dL — ABNORMAL HIGH (ref 70–99)
Potassium: 4 mmol/L (ref 3.5–5.1)
Sodium: 142 mmol/L (ref 135–145)
Total Bilirubin: 0.3 mg/dL (ref 0.3–1.2)
Total Protein: 6.6 g/dL (ref 6.5–8.1)

## 2021-05-07 MED ORDER — TAMOXIFEN CITRATE 10 MG PO TABS: 10.0000 mg | ORAL_TABLET | Freq: Every day | ORAL | 4 refills | Status: DC

## 2021-05-07 MED ORDER — VENLAFAXINE HCL ER 37.5 MG PO CP24: 37.5000 mg | ORAL_CAPSULE | Freq: Every day | ORAL | 4 refills | Status: DC

## 2021-06-15 DIAGNOSIS — D1801 Hemangioma of skin and subcutaneous tissue: Secondary | ICD-10-CM | POA: Diagnosis not present

## 2021-06-15 DIAGNOSIS — L821 Other seborrheic keratosis: Secondary | ICD-10-CM | POA: Diagnosis not present

## 2021-06-15 DIAGNOSIS — L400 Psoriasis vulgaris: Secondary | ICD-10-CM | POA: Diagnosis not present

## 2021-06-15 DIAGNOSIS — H61002 Unspecified perichondritis of left external ear: Secondary | ICD-10-CM | POA: Diagnosis not present

## 2021-06-15 DIAGNOSIS — D485 Neoplasm of uncertain behavior of skin: Secondary | ICD-10-CM | POA: Diagnosis not present

## 2021-06-15 DIAGNOSIS — D2262 Melanocytic nevi of left upper limb, including shoulder: Secondary | ICD-10-CM | POA: Diagnosis not present

## 2021-06-15 DIAGNOSIS — L570 Actinic keratosis: Secondary | ICD-10-CM | POA: Diagnosis not present

## 2021-06-27 ENCOUNTER — Other Ambulatory Visit: Payer: Self-pay | Admitting: Family Medicine

## 2021-06-27 DIAGNOSIS — E039 Hypothyroidism, unspecified: Secondary | ICD-10-CM

## 2021-07-03 DIAGNOSIS — M5416 Radiculopathy, lumbar region: Secondary | ICD-10-CM | POA: Diagnosis not present

## 2021-07-05 ENCOUNTER — Other Ambulatory Visit: Payer: Medicare Other

## 2021-07-06 ENCOUNTER — Other Ambulatory Visit (INDEPENDENT_AMBULATORY_CARE_PROVIDER_SITE_OTHER): Payer: Medicare Other

## 2021-07-06 ENCOUNTER — Other Ambulatory Visit: Payer: Self-pay

## 2021-07-06 DIAGNOSIS — Z23 Encounter for immunization: Secondary | ICD-10-CM

## 2021-07-23 ENCOUNTER — Other Ambulatory Visit: Payer: Self-pay | Admitting: Family Medicine

## 2021-07-23 DIAGNOSIS — I10 Essential (primary) hypertension: Secondary | ICD-10-CM

## 2021-08-01 DIAGNOSIS — Z6828 Body mass index (BMI) 28.0-28.9, adult: Secondary | ICD-10-CM | POA: Diagnosis not present

## 2021-08-01 DIAGNOSIS — M48062 Spinal stenosis, lumbar region with neurogenic claudication: Secondary | ICD-10-CM | POA: Diagnosis not present

## 2021-08-01 DIAGNOSIS — M542 Cervicalgia: Secondary | ICD-10-CM | POA: Diagnosis not present

## 2021-08-01 DIAGNOSIS — M5416 Radiculopathy, lumbar region: Secondary | ICD-10-CM | POA: Diagnosis not present

## 2021-08-02 ENCOUNTER — Other Ambulatory Visit: Payer: Medicare Other

## 2021-08-14 DIAGNOSIS — Z20822 Contact with and (suspected) exposure to covid-19: Secondary | ICD-10-CM | POA: Diagnosis not present

## 2021-09-11 NOTE — Progress Notes (Signed)
Chief Complaint  Patient presents with   Medicare Wellness    Fasting AWV no pap. I will get results of pap from Dr. Stann Mainland. Went for annual derm check and had a place oh her left ear and had doctor look at it. It was not healing well and she did a biopsy on it and it was fine. Was told she had inflammtion of the cartilage. Larger area is more swollen now, doesn't hurt. No there concerns. May want covid booster, but wants to wait due to funeral tomorrow.    Mckenzie Webb is a 76 y.o. female who presents for annual wellness visit and follow-up on chronic medical conditions.    Hypercholesterolemia:  She is compliant with taking Crestor 60m every other day (she had myalgias, "weakness" in her legs when it was taken daily, despite using coenzyme Q10; also previously didn't tolerate lipitor, achey in legs, "felt like she couldn't move").  Diet hasn't changed from when reviewed last year: She continues to limit bacon, eggs (once a week), instead eating Cheerios with 2% milk (but also has some toast).  Red meat 2x/week or less (more red meat in the summer, grilling). Today she reports avoiding fried foods (bothers her stomach).  Admits to some sweets (had chocolate pie slice last night, baking for a funeral). In general has sweets 2x/week.  Has 1-2 cookies with her coffee each morning. TG elevated on last check.  Due for recheck today. Lab Results  Component Value Date   CHOL 193 09/11/2020   HDL 52 09/11/2020   LDLCALC 106 (H) 09/11/2020   TRIG 203 (H) 09/11/2020   CHOLHDL 3.7 09/11/2020   Hypertension:  BP's are checked occasionally, and have been running 120-130/65-70.20-130/65-70. She is compliant with and tolerating HCTZ 12.5 mg without side effects. She eats bananas in her diet. She denies muscle cramps. Denies headaches, dizziness, chest pain, shortness of breath or edema. She reports her monitor was verified as accurate in the past.    Breast Cancer: She was diagnosed in 02/2017 with  invasive ductal carcinoma, grade 1, estrogen receptor 100% positive, and is s/p right lumpectomy and sentinel lymph node biopsy in 03/2017. Mammaprint returned high risk, so she underwent adjuvant chemotherapy, and also completed radiation. She started on Anastrozole in 06/2017, switched to tamoxifen in 04/2018 due to excessive fatigue.  Dose was lowered to 12mdaily, and plan is to continue through 05/2022 (5 years of tx).  She last saw Dr. MaJana Hakimn 04/2021. She continues on Effexor 37.5 for hot flashes (had headaches at higher doses). She gets some hot flashes, mainly after her hot shower in the morning, and is tolerable.  She doesn't handle heat/humidity well. She denies any breast concerns.   Hypothyroidism:  She reports compliance with her medications, on 7511m  She denies any changes in hair/skin/nails, bowels, moods. "I'm always dry"--skin, scalp, but this is no different for her.    Her TSH was elevated at 6.110 in 08/2020, possibly related to change in generic.  Dose was not changed, but she switched back to prior generic, and TSH was normal at 3 month follow-up. Due for recheck today. She plans to change pharmacies soon.  Lab Results  Component Value Date   TSH 3.460 12/14/2020    Psoriasis:  She reports this is under control currently. She uses clobetasol prn. She notes that her skin clears up for a few weeks after her epidural injections for her back (if flaring at the time).  She used to help  control her psoriasis by using tanning bed, but none since she was diagnosed with cancer 2018. She sees dermatologist yearly for skin checks.   GERD--She had been on nightly Omeprazole 7m for a long time, with good control (and recurrence if lower dose).  She is under the care of Dr. MCollene Mares last seen in 12/2020. No notes were received from that visit. She states she was having more issues with diarrhea at that time.  She was given 2 weeks of a sample (?rifaximin??) and told to take Florastar  probiotic.  Her bowels improved.  She switched probiotic to Align now, daily. At her f/u, she was switched from omeprazole to famotidine 457mBID.  She states this was done because she was "on omeprazole for too long".  She had some indigestion initially, but has resolved.   Low back pain: previously under the care of Dr. NuSherwood Gamblers/p epidural injections in June, Sept and Dec 2021. These were helpful. She reports she continues to get them every 3 months, last was in 07/2021. She reports sometimes still having "a fit" from her sciatic nerve--no longer going down the leg, just into the buttock.  She has pain with prolonged standing.  She hasn't had PT and not doing home exercises.  Not interested in PT at this time.  She had R carpal tunnel release and trigger finger release of R middle finger in 12/2020 by Dr. GrAmedeo Plenty CTS on the left is more sporadic, not requiring treatment at this time.   H/o IFG:  Tries to limit sweets/desserts to twice a week, other than her 1-2 cookies (vanilla Oreos) with coffee most days.  Only an occasional sweet tea.  Fasting glucose was 107 at her physical last year, down to 95 on recheck 11/2020. Lab Results  Component Value Date   HGBA1C 5.3 09/06/2019     Immunization History  Administered Date(s) Administered   Fluad Quad(high Dose 65+) 06/18/2019, 06/05/2020, 07/06/2021   Influenza Split 06/20/2011, 06/19/2012, 05/25/2014   Influenza Whole 05/19/2000, 05/19/2001   Influenza, High Dose Seasonal PF 05/23/2016, 06/04/2018   Influenza,inj,Quad PF,6+ Mos 08/01/2017   Influenza-Unspecified 05/19/2014, 05/24/2015, 04/19/2020   Moderna Sars-Covid-2 Vaccination 09/24/2019, 10/23/2019, 08/03/2020   Pneumococcal Conjugate-13 07/26/2013   Pneumococcal Polysaccharide-23 06/16/2000, 07/27/2014   Td 04/13/2001   Tdap 12/31/2010   Zoster, Live 07/26/2013   Last Pap smear: 04/2021 (no results received, will get; notes from visit scanned in per Dr. MaVirgie Dadffice) Last  mammogram: 02/2021 Last colonoscopy: 12/2017 Dr. MaCollene Marestold [id number] (if any)   Last DEXA: 02/2018, normal Dentist: every 6 months   Ophtho: yearly Exercise:  Less in the winter--3 days a week x 20 minutes (walking with weights, leg lifts). Tends to walk more in the Spring. Vitamin D level was normal in 2012   Patient Care Team: KnaRita OharaD as PCP - General (Family Medicine) TotJovita KussmaulD as Consulting Physician (General Surgery) Magrinat, GusVirgie DadD as Consulting Physician (Oncology) SquEppie GibsonD as Attending Physician (Radiation Oncology) ManJuanita CraverD as Consulting Physician (Gastroenterology) WeiVania ReaD as Consulting Physician (Obstetrics and Gynecology) WaiElsie SaasD as Consulting Physician (Orthopedic Surgery) JorMartiniquemy, MD as Consulting Physician (Dermatology) CauDelice BisoninCharlestine MassedP as Nurse Practitioner (Hematology and Oncology) WeiVania ReaD as Consulting Physician (Obstetrics and Gynecology) Ophtho: Dr. CotJorja Loar. ChrMidge Averd cataract surgery Dentist: Dr. BarAhmed Primaologist: Dr. OttKarsten Rourosurgeon: Dr. NudSherwood Gambleretired), now seeing Dr. EckJeral Pinchr pain management Will be changing to Dr. IruAnne Ngr  oncologist since Dr. [name] retired.  Depression Screening: [facility] Office Visit from 09/12/2021 in [facility]  PHQ-2 Total Score 0        Falls screen:  Fall Risk  09/12/2021 09/11/2020 09/06/2019 09/03/2018 09/30/2017  Falls in the past year? 0 0 0 0 No  Comment - - - - -  Number falls in past yr: 0 - - - -  Injury with Fall? 0 - - - -  Risk for fall due to : No Fall Risks - - - -  Follow up Falls evaluation completed - - - -     Functional Status Survey: Is the patient deaf or have difficulty hearing?: No Does the patient have difficulty seeing, even when wearing glasses/contacts?: Yes (beginning stages of MD) Does the patient have difficulty concentrating, remembering, or making decisions?: Yes (just short  term memory) Does the patient have difficulty walking or climbing stairs?: No Does the patient have difficulty dressing or bathing?: No Does the patient have difficulty doing errands alone such as visiting a doctor's office or shopping?: No  Mini-Cog Scoring: 5    End of Life Discussion:  Patient does not have a living will and medical power of attorney. She was given forms in the past, filled them out, but hasn't gotten them notarized yet.  They are still sitting at home. Same as last year.   PMH, PSH, SH and FH reviewed and updated  Outpatient Encounter Medications as of 09/12/2021  Medication Sig Note   calcium-vitamin D (OSCAL WITH D) 500-200 MG-UNIT tablet Take 1 tablet by mouth daily.    Coenzyme Q10 (CO Q-10) 100 MG CAPS Take by mouth daily.    famotidine (PEPCID) 40 MG tablet Take 40 mg by mouth 2 (two) times daily.    hydrochlorothiazide (MICROZIDE) 12.5 MG capsule Take 1 capsule by mouth once daily    levothyroxine (SYNTHROID) 75 MCG tablet Take 1 tablet by mouth once daily    loratadine (CLARITIN) 10 MG tablet Take 10 mg by mouth daily as needed for allergies.     Multiple Vitamins-Minerals (ALIVE WOMENS 50+ PO) Take 1 tablet by mouth daily.     Multiple Vitamins-Minerals (PRESERVISION AREDS 2 PO) Take 1 tablet by mouth in the morning and at bedtime.    Probiotic Product (ALIGN PO) Take 1 capsule by mouth daily.    rosuvastatin (CRESTOR) 10 MG tablet Take 1 tablet by mouth once daily    tamoxifen (NOLVADEX) 10 MG tablet Take 1 tablet (10 mg total) by mouth daily.    venlafaxine XR (EFFEXOR-XR) 37.5 MG 24 hr capsule Take 1 capsule (37.5 mg total) by mouth daily with breakfast.    acetaminophen (TYLENOL) 500 MG tablet Take 1,000 mg by mouth every 6 (six) hours as needed. (Patient not taking: Reported on 09/11/2020) 09/12/2021: As needed   clobetasol cream (TEMOVATE) 0.05 % Apply topically 2 (two) times daily as needed. (Patient not taking: Reported on 09/12/2021) 09/12/2021: As needed    fluticasone (FLONASE) 50 MCG/ACT nasal spray Place 2 sprays into both nostrils daily. (Patient not taking: Reported on 09/11/2020) 09/12/2021: As needed   [DISCONTINUED] omeprazole (PRILOSEC) 40 MG capsule Take 1 capsule by mouth once daily    No facility-administered encounter medications on file as of 09/12/2021.   Allergies  Allergen Reactions   Adhesive [Tape] Other (See Comments)    SKIN TEARS   Ciprofloxacin Nausea Only   Codeine Nausea Only   Iodine Swelling   Polysporin [Bacitracin-Polymyxin B] Swelling  OPHTHALMIC - SWELLING OF EYES   Penicillins Rash        Sulfa Antibiotics Itching     ROS: The patient denies anorexia, fever, decreased hearing, ear pain, sore throat, breast concerns, chest pain, palpitations, dizziness, syncope, dyspnea on exertion, swelling, nausea, vomiting, melena, hematochezia, hematuria, incontinence, dysuria, vaginal bleeding, discharge, odor or itch, genital lesions, numbness, tingling, weakness, tremor, suspicious skin lesions, depression, anxiety, abnormal bleeding/bruising, or enlarged lymph nodes. Hemorroids periodically flare.  S/p banding.  Denies any bleeding. Low back pain per HPI.  No numbness, tingling, weakness.  Neck pain, mild and chronic (left-sided). Carpal tunnel syndrome is sporadic, on the left, mild/tolerable.  R is doing well since surgery. Hot flashes are tolerable with effexor, mainly in the morning after shower Psoriasis is not currently flaring. Denies vision changes; was noted to have start of macular degeneration.  PHYSICAL EXAM:  BP 120/70    Pulse 72    Ht 5' 3.5" (1.613 m)    Wt 163 lb (73.9 kg)    BMI 28.42 kg/m   Wt Readings from Last 3 Encounters:  09/12/21 163 lb (73.9 kg)  05/07/21 163 lb 6.4 oz (74.1 kg)  09/11/20 162 lb 3.2 oz (73.6 kg)   General Appearance:    Alert, cooperative, no distress, appears stated age. She declined changing into a gown  Head:    Normocephalic, without obvious abnormality,  atraumatic     Eyes:    PERRL, conjunctiva/corneas clear, EOM's intact, fundi benign     Ears:    Normal TM's and external ear canals     Nose:    Not examined, wearing mask due to COVID-19 pandemic     Throat:    Not examined, wearing mask due to COVID-19 pandemic  Neck:    Supple, no lymphadenopathy; thyroid: no enlargement/ tenderness/nodules; no carotid bruit or JVD     Back:    Spine nontender, no curvature, ROM normal, no CVA tenderness.   Lungs:    Clear to auscultation bilaterally without wheezes, rales or ronchi; respirations unlabored     Chest Wall:    No tenderness or deformity.   Heart:    Regular rate and rhythm, S1 and S2 normal, no murmur, rub or gallop     Breast Exam:    Deferred to GYN     Abdomen:    Soft, nondistended, normoactive bowel sounds, no masses, no hepatosplenomegaly.   Genitalia:    Deferred to GYN     Extremities:    No clubbing, cyanosis or edema.  Pulses:    2+ and symmetric all extremities     Skin:    Normal turgor. Exam is limited due to not changing into gown.  Lymph nodes:    Cervical, supraclavicular nodes normal     Neurologic:    Normal strength, sensation and gait; reflexes 2+ and symmetric throughout                     Psych:    Normal mood, affect, hygiene and grooming  Labs reviewed from oncology 04/2021: Normal CBC, c-met.  Glucose 108 (non-fasting)  ASSESSMENT/PLAN:  Medicare annual wellness visit, subsequent  IFG (impaired fasting glucose) - proper diet, daily exercise and wt loss discussed - Plan: Glucose, random, Hemoglobin A1c  Hypothyroidism, unspecified type - Plan: TSH  Essential hypertension, benign - well controlled  Pure hypercholesterolemia - due for recheck. Lowfat, low cholesterol diet reviewed - Plan: Lipid panel  Malignant neoplasm of upper-inner quadrant  of right breast in female, estrogen receptor positive ([place]) - cont tamoxifen through 05/2022 per oncologist  Gastroesophageal reflux disease, unspecified whether  esophagitis present - controlled on pepcid BID  Hot flashes due to tamoxifen - tolerable, cont effexor  Psoriasis - under care of derm, stable  Medication monitoring encounter - Plan: TSH, Lipid panel  BMI 28.0-28.9,adult - counseled on healthy diet, exercise, wt loss  Will get pap results from GYN. TSH, fglu, lipids, A1c (with labs) today RF thyroid med after labs back--patient wants to switch pharmacies to [facility] in [place] [facility] [place].) Notate that pt has a preferred generic and will call with type to see if available. HCTZ good until March   Discussed monthly self breast exams and yearly mammograms; at least 30 minutes of aerobic activity at least 5 days/week and weight-bearing exercise 2x/week; proper sunscreen use reviewed; healthy diet, including goals of calcium and vitamin D intake and alcohol recommendations (less than or equal to 1 drink/day) reviewed; regular seatbelt use; changing batteries in smoke detectors.  Immunization recommendations discussed--continue high dose flu shots yearly.  COVID booster is recommended, declines today, will return for [place]. Tetanus booster is due (to get TdaP from pharmacy). Shingrix is recommended; risks/SE reviewed, to get from pharmacy.  Colonoscopy recommendations reviewed, UTD   MOST form reviewed and updated. Full Code, Full care. Reminded to get the Living Will and healthcare POA notarized and to get [place] copies..    F/u 1 year, sooner prn or based on lab results.   Medicare Attestation I have personally reviewed: The patient's medical and social history Their use of alcohol, tobacco or illicit drugs Their current medications and supplements The patient's functional ability including ADLs,fall risks, home safety risks, cognitive, and hearing and visual impairment Diet and physical activities Evidence for depression or mood disorders  The patient's weight, height, BMI have been recorded in the chart.  I have made  referrals, counseling, and provided education to the patient based on review of the above and I have provided the patient with a written personalized care plan for preventive services.

## 2021-09-11 NOTE — Patient Instructions (Addendum)
°  HEALTH MAINTENANCE RECOMMENDATIONS:  It is recommended that you get at least 30 minutes of aerobic exercise at least 5 days/week (for weight loss, you may need as much as 60-90 minutes). This can be any activity that gets your heart rate up. This can be divided in 10-15 minute intervals if needed, but try and build up your endurance at least once a week.  Weight bearing exercise is also recommended twice weekly.  Eat a healthy diet with lots of vegetables, fruits and fiber.  "Colorful" foods have a lot of vitamins (ie green vegetables, tomatoes, red peppers, etc).  Limit sweet tea, regular sodas and alcoholic beverages, all of which has a lot of calories and sugar.  Up to 1 alcoholic drink daily may be beneficial for women (unless trying to lose weight, watch sugars).  Drink a lot of water.  Calcium recommendations are 1200-1500 mg daily (1500 mg for postmenopausal women or women without ovaries), and vitamin D 1000 IU daily.  This should be obtained from diet and/or supplements (vitamins), and calcium should not be taken all at once, but in divided doses.  Monthly self breast exams and yearly mammograms for women over the age of 48 is recommended.  Sunscreen of at least SPF 30 should be used on all sun-exposed parts of the skin when outside between the hours of 10 am and 4 pm (not just when at beach or pool, but even with exercise, golf, tennis, and yard work!)  Use a sunscreen that says "broad spectrum" so it covers both UVA and UVB rays, and make sure to reapply every 1-2 hours.  Remember to change the batteries in your smoke detectors when changing your clock times in the spring and fall. Carbon monoxide detectors are recommended for your home.  Use your seat belt every time you are in a car, and please drive safely and not be distracted with cell phones and texting while driving.   Mckenzie Webb , Thank you for taking time to come for your Medicare Wellness Visit. I appreciate your ongoing  commitment to your health goals. Please review the following plan we discussed and let me know if I can assist you in the future.   This is a list of the screening recommended for you and due dates:  Health Maintenance  Topic Date Due   Zoster (Shingles) Vaccine (1 of 2) Never done   COVID-19 Vaccine (4 - Booster for Moderna series) 09/28/2020   Tetanus Vaccine  12/30/2020   Mammogram  03/01/2022   Colon Cancer Screening  12/25/2027   Pneumonia Vaccine  Completed   Flu Shot  Completed   DEXA scan (bone density measurement)  Completed   Hepatitis C Screening: USPSTF Recommendation to screen - Ages 8-79 yo.  Completed   HPV Vaccine  Aged Out   Please get your tetanus booster (TdaP) and Shingrix (newer shingles vaccine) from the pharmacy. These should be given 2 weeks apart from each other or any other vaccines.  I encourage you to get your COVID booster (either here as a nurse visit, or at the pharmacy).  Please get your paperwork notarized, and get [place] a copy so that we can get it scanned into your medical chart (living will and healthcare power of attorney).

## 2021-09-12 ENCOUNTER — Other Ambulatory Visit: Payer: Self-pay

## 2021-09-12 ENCOUNTER — Encounter: Payer: Self-pay | Admitting: Family Medicine

## 2021-09-12 ENCOUNTER — Ambulatory Visit (INDEPENDENT_AMBULATORY_CARE_PROVIDER_SITE_OTHER): Payer: Medicare HMO | Admitting: Family Medicine

## 2021-09-12 VITALS — BP 120/70 | HR 72 | Ht 63.5 in | Wt 163.0 lb

## 2021-09-12 DIAGNOSIS — E039 Hypothyroidism, unspecified: Secondary | ICD-10-CM

## 2021-09-12 DIAGNOSIS — R7301 Impaired fasting glucose: Secondary | ICD-10-CM

## 2021-09-12 DIAGNOSIS — R232 Flushing: Secondary | ICD-10-CM

## 2021-09-12 DIAGNOSIS — E7800 Pure hypercholesterolemia, unspecified: Secondary | ICD-10-CM | POA: Diagnosis not present

## 2021-09-12 DIAGNOSIS — I10 Essential (primary) hypertension: Secondary | ICD-10-CM | POA: Diagnosis not present

## 2021-09-12 DIAGNOSIS — K219 Gastro-esophageal reflux disease without esophagitis: Secondary | ICD-10-CM

## 2021-09-12 DIAGNOSIS — C50211 Malignant neoplasm of upper-inner quadrant of right female breast: Secondary | ICD-10-CM | POA: Diagnosis not present

## 2021-09-12 DIAGNOSIS — Z5181 Encounter for therapeutic drug level monitoring: Secondary | ICD-10-CM | POA: Diagnosis not present

## 2021-09-12 DIAGNOSIS — Z6828 Body mass index (BMI) 28.0-28.9, adult: Secondary | ICD-10-CM

## 2021-09-12 DIAGNOSIS — T451X5A Adverse effect of antineoplastic and immunosuppressive drugs, initial encounter: Secondary | ICD-10-CM

## 2021-09-12 DIAGNOSIS — L409 Psoriasis, unspecified: Secondary | ICD-10-CM | POA: Diagnosis not present

## 2021-09-12 DIAGNOSIS — Z170 Estrogen receptor positive status [ER+]: Secondary | ICD-10-CM

## 2021-09-12 DIAGNOSIS — Z0000 Encounter for general adult medical examination without abnormal findings: Secondary | ICD-10-CM

## 2021-09-13 LAB — LIPID PANEL
Chol/HDL Ratio: 4 ratio (ref 0.0–4.4)
Cholesterol, Total: 196 mg/dL (ref 100–199)
HDL: 49 mg/dL (ref >39)
LDL Chol Calc (NIH): 106 mg/dL — ABNORMAL HIGH (ref 0–99)
Triglycerides: 237 mg/dL — ABNORMAL HIGH (ref 0–149)
VLDL Cholesterol Cal: 41 mg/dL — ABNORMAL HIGH (ref 5–40)

## 2021-09-13 LAB — HEMOGLOBIN A1C
Est. average glucose Bld gHb Est-mCnc: 117 mg/dL
Hgb A1c MFr Bld: 5.7 % — ABNORMAL HIGH (ref 4.8–5.6)

## 2021-09-13 LAB — TSH: TSH: 5.38 u[IU]/mL — ABNORMAL HIGH (ref 0.450–4.500)

## 2021-09-13 LAB — GLUCOSE, RANDOM: Glucose: 94 mg/dL (ref 70–99)

## 2021-09-15 ENCOUNTER — Encounter: Payer: Self-pay | Admitting: Family Medicine

## 2021-09-15 DIAGNOSIS — E039 Hypothyroidism, unspecified: Secondary | ICD-10-CM

## 2021-09-15 MED ORDER — LEVOTHYROXINE SODIUM 88 MCG PO TABS: 88.0000 ug | ORAL_TABLET | Freq: Every day | ORAL | 0 refills | Status: DC

## 2021-09-27 ENCOUNTER — Other Ambulatory Visit: Payer: Self-pay | Admitting: *Deleted

## 2021-09-27 ENCOUNTER — Encounter: Payer: Self-pay | Admitting: Family Medicine

## 2021-09-27 ENCOUNTER — Encounter: Payer: Self-pay | Admitting: *Deleted

## 2021-09-27 DIAGNOSIS — J3489 Other specified disorders of nose and nasal sinuses: Secondary | ICD-10-CM

## 2021-09-27 DIAGNOSIS — J302 Other seasonal allergic rhinitis: Secondary | ICD-10-CM

## 2021-09-27 MED ORDER — FLUTICASONE PROPIONATE 50 MCG/ACT NA SUSP: 2.0000 | Freq: Every day | NASAL | 2 refills | Status: DC

## 2021-10-02 ENCOUNTER — Encounter: Payer: Self-pay | Admitting: Family Medicine

## 2021-10-11 DIAGNOSIS — M5416 Radiculopathy, lumbar region: Secondary | ICD-10-CM | POA: Diagnosis not present

## 2021-10-22 ENCOUNTER — Telehealth (INDEPENDENT_AMBULATORY_CARE_PROVIDER_SITE_OTHER): Payer: Medicare HMO | Admitting: Family Medicine

## 2021-10-22 ENCOUNTER — Encounter: Payer: Self-pay | Admitting: Family Medicine

## 2021-10-22 ENCOUNTER — Other Ambulatory Visit: Payer: Self-pay | Admitting: Family Medicine

## 2021-10-22 VITALS — BP 132/77 | HR 90 | Temp 98.5°F | Ht 63.5 in | Wt 162.0 lb

## 2021-10-22 DIAGNOSIS — I10 Essential (primary) hypertension: Secondary | ICD-10-CM

## 2021-10-22 DIAGNOSIS — J019 Acute sinusitis, unspecified: Secondary | ICD-10-CM

## 2021-10-22 MED ORDER — AZITHROMYCIN 250 MG PO TABS: ORAL_TABLET | ORAL | 0 refills | Status: DC

## 2021-10-22 NOTE — Patient Instructions (Signed)
Stay well hydrated. ?Stop the flonase for 1-2 days (due to nose bleeding) and use nasal saline spray very frequently (every 1-2 hours)  After 1-2 days, you may restart the Flonase (remember to aim it away from the midline of the nose). ? ?Try using sinus rinses (either Neti-pot or sinus rinse kit) once or twice daily to help clear out the sinuses. ?Be sure to use distilled water or boiled water (not tap water). ?If after 1-2 days of doing this, if your sinus discomfort and color of the mucus remains abnormal, then start the antibiotic.  Take it for the full 5 days, but remember that it works for 10 days. ?Contact us after a week or zero better or worse, or at 10 days if not completely better. ?

## 2021-10-22 NOTE — Progress Notes (Signed)
Start time: 3:37 ?End time: 3:48 ? ?Virtual Visit via Video Note ? ?I connected with Mckenzie Webb on 10/22/21 by a video enabled telemedicine application and verified that I am speaking with the correct person using two identifiers. ? ?Location: ?Patient: home ?Provider: office ?  ?I discussed the limitations of evaluation and management by telemedicine and the availability of in person appointments. The patient expressed understanding and agreed to proceed. ? ?History of Present Illness: ? ?Chief Complaint  ?Patient presents with  ? Facial Pain  ?  VIRTUAL across her cheekbones it hurts. Has bloody mucus when she blows her nose-this has been for about 2-3 weeks.   ? ?Her "eyeballs" hurt, and both cheekbones, and across her nose. ?No pain in her forehead. ?She has had symptoms for about 2-3 weeks, not worsening, but not improving. ?Nasal drainage is yellowish, and sometimes mixed with blood (frequently, especially in the morning). ?She gets some PND, and sometimes will notice some sore throat.  Mucinex has improved this. ? ?She has been using Flonase.  Thought it helped some, but not resolving. ? ?She denies fever, chills. ?"I just don't feel good" ? ?PMH, PSH, SH reviewed ? ?Outpatient Encounter Medications as of 10/22/2021  ?Medication Sig Note  ? acetaminophen (TYLENOL) 500 MG tablet Take 1,000 mg by mouth every 6 (six) hours as needed. 10/22/2021: Last dose 8:30am  ? calcium-vitamin D (OSCAL WITH D) 500-200 MG-UNIT tablet Take 1 tablet by mouth daily.   ? Coenzyme Q10 (CO Q-10) 100 MG CAPS Take by mouth daily.   ? famotidine (PEPCID) 40 MG tablet Take 40 mg by mouth 2 (two) times daily.   ? fluticasone (FLONASE) 50 MCG/ACT nasal spray Place 2 sprays into both nostrils daily.   ? guaiFENesin (MUCINEX) 600 MG 12 hr tablet Take 600 mg by mouth 2 (two) times daily. 10/22/2021: Last dose 8:30am  ? hydrochlorothiazide (MICROZIDE) 12.5 MG capsule Take 1 capsule by mouth once daily   ? levothyroxine (SYNTHROID) 88 MCG  tablet Take 1 tablet (88 mcg total) by mouth daily.   ? loratadine (CLARITIN) 10 MG tablet Take 10 mg by mouth daily as needed for allergies.    ? Multiple Vitamins-Minerals (ALIVE WOMENS 50+ PO) Take 1 tablet by mouth daily.    ? Multiple Vitamins-Minerals (PRESERVISION AREDS 2 PO) Take 1 tablet by mouth in the morning and at bedtime.   ? Probiotic Product (ALIGN PO) Take 1 capsule by mouth daily.   ? rosuvastatin (CRESTOR) 10 MG tablet Take 1 tablet by mouth once daily   ? tamoxifen (NOLVADEX) 10 MG tablet Take 1 tablet (10 mg total) by mouth daily.   ? venlafaxine XR (EFFEXOR-XR) 37.5 MG 24 hr capsule Take 1 capsule (37.5 mg total) by mouth daily with breakfast.   ? clobetasol cream (TEMOVATE) 0.05 % Apply topically 2 (two) times daily as needed. (Patient not taking: Reported on 09/12/2021) 09/12/2021: As needed  ? ?No facility-administered encounter medications on file as of 10/22/2021.  ? ?Allergies  ?Allergen Reactions  ? Adhesive [Tape] Other (See Comments)  ?  SKIN TEARS  ? Ciprofloxacin Nausea Only  ? Codeine Nausea Only  ? Iodine Swelling  ? Polysporin [Bacitracin-Polymyxin B] Swelling  ?  OPHTHALMIC - SWELLING OF EYES  ? Penicillins Rash  ?   ?  ? Sulfa Antibiotics Itching  ? ?ROS: no fever, chills, cough, shortness of breath, chest pain.  +PND and congestion and sinus pain per HPI.  No n/v/d or other complaints ? ?  ?  Observations/Objective: ? ?BP 132/77   Pulse 90   Temp 98.5 ?F (36.9 ?C) (Oral)   Ht 5' 3.5" (1.613 m)   Wt 162 lb (73.5 kg)   BMI 28.25 kg/m?  ? ?Well-appearing, pleasant female, in no distress. ?No throat-clearing or coughing during visit, doesn't sound congested. ?She is alert and oriented. ?Exam is limited due to virtual nature of the visit. ? ? ?Assessment and Plan: ? ? ?Acute non-recurrent sinusitis, unspecified location - sinus congestion vs infection. Discussed sinus rinses, saline spray. Start z-pak in 1-2d if continued pain and purulence - Plan: azithromycin (ZITHROMAX) 250 MG  tablet ? ?Stay well hydrated. ?Stop the flonase for 1-2 days (due to nose bleeding) and use nasal saline spray very frequently (every 1-2 hours)  After 1-2 days, you may restart the Flonase (remember to aim it away from the midline of the nose). ? ?Try using sinus rinses (either Neti-pot or sinus rinse kit) once or twice daily to help clear out the sinuses. ?Be sure to use distilled water or boiled water (not tap water). ?If after 1-2 days of doing this, if your sinus discomfort and color of the mucus remains abnormal, then start the antibiotic.  Take it for the full 5 days, but remember that it works for 10 days. ?Contact us after a week or zero better or worse, or at 10 days if not completely better. ? ? ? ?Follow Up Instructions: ? ?  ?I discussed the assessment and treatment plan with the patient. The patient was provided an opportunity to ask questions and all were answered. The patient agreed with the plan and demonstrated an understanding of the instructions. ?  ?The patient was advised to call back or seek an in-person evaluation if the symptoms worsen or if the condition fails to improve as anticipated. ? ?I spent 14 minutes dedicated to the care of this patient, including pre-visit review of records, face to face time, post-visit ordering of testing and documentation. ? ? ? ?Vikki Ports, MD ?

## 2021-10-31 ENCOUNTER — Other Ambulatory Visit: Payer: Self-pay

## 2021-10-31 ENCOUNTER — Other Ambulatory Visit: Payer: Medicare HMO

## 2021-10-31 DIAGNOSIS — Z6828 Body mass index (BMI) 28.0-28.9, adult: Secondary | ICD-10-CM | POA: Diagnosis not present

## 2021-10-31 DIAGNOSIS — M48062 Spinal stenosis, lumbar region with neurogenic claudication: Secondary | ICD-10-CM | POA: Diagnosis not present

## 2021-10-31 DIAGNOSIS — E039 Hypothyroidism, unspecified: Secondary | ICD-10-CM

## 2021-10-31 DIAGNOSIS — M5416 Radiculopathy, lumbar region: Secondary | ICD-10-CM | POA: Diagnosis not present

## 2021-11-01 ENCOUNTER — Encounter: Payer: Self-pay | Admitting: Family Medicine

## 2021-11-01 ENCOUNTER — Other Ambulatory Visit: Payer: Medicare HMO

## 2021-11-01 LAB — TSH: TSH: 1.31 u[IU]/mL (ref 0.450–4.500)

## 2021-11-01 MED ORDER — AZITHROMYCIN 250 MG PO TABS: ORAL_TABLET | ORAL | 0 refills | Status: DC

## 2021-12-12 ENCOUNTER — Other Ambulatory Visit: Payer: Self-pay | Admitting: Family Medicine

## 2021-12-12 DIAGNOSIS — E039 Hypothyroidism, unspecified: Secondary | ICD-10-CM

## 2022-01-16 ENCOUNTER — Other Ambulatory Visit: Payer: Self-pay | Admitting: Hematology and Oncology

## 2022-01-16 DIAGNOSIS — Z853 Personal history of malignant neoplasm of breast: Secondary | ICD-10-CM

## 2022-01-18 DIAGNOSIS — M5416 Radiculopathy, lumbar region: Secondary | ICD-10-CM | POA: Diagnosis not present

## 2022-02-22 ENCOUNTER — Encounter: Payer: Self-pay | Admitting: Family Medicine

## 2022-03-04 ENCOUNTER — Ambulatory Visit: Payer: Medicare Other

## 2022-03-14 ENCOUNTER — Ambulatory Visit
Admission: RE | Admit: 2022-03-14 | Discharge: 2022-03-14 | Disposition: A | Discharge status: Home / Self Care | Payer: Medicare HMO | Source: Ambulatory Visit | Attending: Hematology and Oncology | Admitting: Hematology and Oncology

## 2022-03-14 DIAGNOSIS — Z853 Personal history of malignant neoplasm of breast: Secondary | ICD-10-CM

## 2022-03-14 DIAGNOSIS — Z1231 Encounter for screening mammogram for malignant neoplasm of breast: Secondary | ICD-10-CM | POA: Diagnosis not present

## 2022-04-05 ENCOUNTER — Other Ambulatory Visit: Payer: Self-pay | Admitting: Family Medicine

## 2022-04-05 DIAGNOSIS — E7800 Pure hypercholesterolemia, unspecified: Secondary | ICD-10-CM

## 2022-04-18 ENCOUNTER — Other Ambulatory Visit: Payer: Self-pay | Admitting: Family Medicine

## 2022-04-18 NOTE — Telephone Encounter (Signed)
Spoke with patient and let her know. Rx refused.

## 2022-04-18 NOTE — Telephone Encounter (Signed)
I think Dr. Collene Mares prescribes this. Do you see where we did?

## 2022-04-18 NOTE — Telephone Encounter (Signed)
Yes, I called patient and she stated that Dr. Collene Mares is the one who prescribes this. She tried to have filled by Dr. Collene Mares and she was told she needed to have a visit before she would fill it. Patient said she does not have any reason to be seen and would like to know if you would fill.

## 2022-04-18 NOTE — Telephone Encounter (Signed)
I'm not comfortable with writing 104m BID long-term.  That is a high dose, and isn't usually long-term. I don't believe I have notes from Dr. MCollene Maresdiscussing plans about this.  She likely is past due to f/u with her

## 2022-04-18 NOTE — Telephone Encounter (Signed)
Is this okay?

## 2022-04-24 ENCOUNTER — Encounter: Payer: Self-pay | Admitting: Internal Medicine

## 2022-05-01 DIAGNOSIS — D3131 Benign neoplasm of right choroid: Secondary | ICD-10-CM | POA: Diagnosis not present

## 2022-05-02 ENCOUNTER — Telehealth: Payer: Self-pay | Admitting: Hematology and Oncology

## 2022-05-02 DIAGNOSIS — M5416 Radiculopathy, lumbar region: Secondary | ICD-10-CM | POA: Diagnosis not present

## 2022-05-02 NOTE — Telephone Encounter (Signed)
Contacted patient to scheduled appointments. Left message with appointment details and a call back number if patient had any questions or could not accommodate the time we provided.   

## 2022-05-28 ENCOUNTER — Encounter: Payer: Self-pay | Admitting: Internal Medicine

## 2022-05-29 ENCOUNTER — Other Ambulatory Visit: Payer: Medicare Other

## 2022-05-29 ENCOUNTER — Ambulatory Visit: Payer: Self-pay | Admitting: Hematology and Oncology

## 2022-05-30 ENCOUNTER — Other Ambulatory Visit: Payer: Self-pay | Admitting: *Deleted

## 2022-05-30 DIAGNOSIS — C50211 Malignant neoplasm of upper-inner quadrant of right female breast: Secondary | ICD-10-CM

## 2022-05-31 ENCOUNTER — Encounter: Payer: Self-pay | Admitting: Hematology and Oncology

## 2022-05-31 ENCOUNTER — Inpatient Hospital Stay: Payer: Medicare HMO | Admitting: Hematology and Oncology

## 2022-05-31 ENCOUNTER — Other Ambulatory Visit: Payer: Self-pay

## 2022-05-31 ENCOUNTER — Inpatient Hospital Stay: Payer: Medicare HMO | Attending: Hematology and Oncology

## 2022-05-31 VITALS — BP 143/80 | Temp 97.7°F | Resp 17 | Wt 169.4 lb

## 2022-05-31 DIAGNOSIS — Z923 Personal history of irradiation: Secondary | ICD-10-CM | POA: Diagnosis not present

## 2022-05-31 DIAGNOSIS — Z9221 Personal history of antineoplastic chemotherapy: Secondary | ICD-10-CM | POA: Insufficient documentation

## 2022-05-31 DIAGNOSIS — Z800 Family history of malignant neoplasm of digestive organs: Secondary | ICD-10-CM | POA: Insufficient documentation

## 2022-05-31 DIAGNOSIS — Z170 Estrogen receptor positive status [ER+]: Secondary | ICD-10-CM

## 2022-05-31 DIAGNOSIS — Z79899 Other long term (current) drug therapy: Secondary | ICD-10-CM | POA: Diagnosis not present

## 2022-05-31 DIAGNOSIS — C50911 Malignant neoplasm of unspecified site of right female breast: Secondary | ICD-10-CM | POA: Insufficient documentation

## 2022-05-31 DIAGNOSIS — Z8043 Family history of malignant neoplasm of testis: Secondary | ICD-10-CM | POA: Diagnosis not present

## 2022-05-31 DIAGNOSIS — E039 Hypothyroidism, unspecified: Secondary | ICD-10-CM | POA: Insufficient documentation

## 2022-05-31 DIAGNOSIS — C50211 Malignant neoplasm of upper-inner quadrant of right female breast: Secondary | ICD-10-CM | POA: Diagnosis not present

## 2022-05-31 DIAGNOSIS — Z79810 Long term (current) use of selective estrogen receptor modulators (SERMs): Secondary | ICD-10-CM | POA: Insufficient documentation

## 2022-05-31 DIAGNOSIS — Z801 Family history of malignant neoplasm of trachea, bronchus and lung: Secondary | ICD-10-CM | POA: Insufficient documentation

## 2022-05-31 LAB — CMP (CANCER CENTER ONLY)
ALT: 12 U/L (ref 0–44)
AST: 16 U/L (ref 15–41)
Albumin: 3.8 g/dL (ref 3.5–5.0)
Alkaline Phosphatase: 49 U/L (ref 38–126)
Anion gap: 3 — ABNORMAL LOW (ref 5–15)
BUN: 15 mg/dL (ref 8–23)
CO2: 31 mmol/L (ref 22–32)
Calcium: 9.2 mg/dL (ref 8.9–10.3)
Chloride: 106 mmol/L (ref 98–111)
Creatinine: 0.76 mg/dL (ref 0.44–1.00)
GFR, Estimated: >60 mL/min (ref >60)
Glucose, Bld: 94 mg/dL (ref 70–99)
Potassium: 4.3 mmol/L (ref 3.5–5.1)
Sodium: 140 mmol/L (ref 135–145)
Total Bilirubin: 0.4 mg/dL (ref 0.3–1.2)
Total Protein: 6.9 g/dL (ref 6.5–8.1)

## 2022-05-31 LAB — CBC WITH DIFFERENTIAL (CANCER CENTER ONLY)
Abs Immature Granulocytes: 0.01 10*3/uL (ref 0.00–0.07)
Basophils Absolute: 0 10*3/uL (ref 0.0–0.1)
Basophils Relative: 0 %
Eosinophils Absolute: 0.1 10*3/uL (ref 0.0–0.5)
Eosinophils Relative: 2 %
HCT: 36.8 % (ref 36.0–46.0)
Hemoglobin: 12.6 g/dL (ref 12.0–15.0)
Immature Granulocytes: 0 %
Lymphocytes Relative: 40 %
Lymphs Abs: 1.9 10*3/uL (ref 0.7–4.0)
MCH: 32.1 pg (ref 26.0–34.0)
MCHC: 34.2 g/dL (ref 30.0–36.0)
MCV: 93.6 fL (ref 80.0–100.0)
Monocytes Absolute: 0.6 10*3/uL (ref 0.1–1.0)
Monocytes Relative: 12 %
Neutro Abs: 2.1 10*3/uL (ref 1.7–7.7)
Neutrophils Relative %: 46 %
Platelet Count: 150 10*3/uL (ref 150–400)
RBC: 3.93 MIL/uL (ref 3.87–5.11)
RDW: 12.7 % (ref 11.5–15.5)
WBC Count: 4.7 10*3/uL (ref 4.0–10.5)
nRBC: 0 % (ref 0.0–0.2)

## 2022-05-31 NOTE — Progress Notes (Signed)
Mckenzie Webb  [place]  [phone]) [phone] [phone]     ID: Mckenzie Webb DOB: 03-29-1946  MR#: [id number]  SVX#:[id number]  Patient Care Team: Rita Ohara, MD as PCP - General (Family Medicine) Jovita Kussmaul, MD as Consulting Physician (General Surgery) Magrinat, Virgie Dad, MD (Inactive) as Consulting Physician (Oncology) Eppie Gibson, MD as Attending Physician (Radiation Oncology) Juanita Craver, MD as Consulting Physician (Gastroenterology) Vania Rea, MD as Consulting Physician (Obstetrics and Gynecology) Elsie Saas, MD as Consulting Physician (Orthopedic Surgery) Martinique, Amy, MD as Consulting Physician (Dermatology) Delice Bison, Charlestine Massed, NP as Nurse Practitioner (Hematology and Oncology) Vania Rea, MD as Consulting Physician (Obstetrics and Gynecology) Madelin Headings, DO (Optometry) Warden Fillers, MD as Consulting Physician (Ophthalmology) Kathie Rhodes, MD (Inactive) as Referring Physician (Urology) Reece Agar, MD as Consulting Physician (Pain Medicine) Dr. Gerald Stabs, DDS as Consulting Physician (Dentistry) OTHER MD:   CHIEF COMPLAINT: Estrogen receptor positive breast cancer  CURRENT TREATMENT: tamoxifen (at 10 mg/d)   INTERVAL HISTORY: Mckenzie Webb returns today for follow-up of her estrogen receptor positive breast cancer.  She is here for follow-up on tamoxifen.  She is currently on 10 mg of tamoxifen and still has these intractable hot flashes.  She is almost done with antiestrogen therapy for 5 years and was wondering if she can be graduated and can return to clinic as needed.  She denies any new breast changes. She had a mammogram July 2023 with no mammographic evidence of malignancy.  Rest of the pertinent 10 point ROS reviewed and negative.   COVID 19 VACCINATION STATUS: Moderna x3, most recently 07/2020   BREAST CANCER HISTORY: From the original intake note:  "Mckenzie Webb" had bilateral screening mammography with  tomography at the [facility] 02/21/2017. This showed a possible mass in the right breast. Right diagnostic mammography with tomography and ultrasonography 02/27/2017 found a breast density to be category B. In the subareolar right breast there was a 0.5 cm spiculated mass, which was not palpable. There was mild right nipple inversion, which per report was chronic. Ultrasonography confirmed a 0.5 cm retroareolar breast mass at the 1:00 radiant. The right axilla was sonographically benign.  On 03/03/2017 the patient underwent biopsy of the right breast mass in question. This showed ([id number]) and invasive ductal carcinoma, grade 1, estrogen receptor 100% positive, progesterone receptor negative, with an MIB-1 of 3%, and no HER-2 implication, the signals ratio being 1.25 and the number per cell 2.00.  The patient's subsequent history is as detailed below.   PAST MEDICAL HISTORY: Past Medical History:  Diagnosis Date   Arthritis    lower back   Cough 07/22/2017   Dental crowns present    Family history of adverse reaction to anesthesia    pt's sister has hx. of post-op N/V   GERD (gastroesophageal reflux disease)    History of chemotherapy    finished chemo 06/30/2017   History of radiation therapy 07/29/17- 08/28/17   Right Breast and Axilla treated to 42.56 Gy with 16 fx of 2.66. Boost of 8 Gy with 4 fx of 2 Gy.    History of right breast cancer 02/2017   Hyperlipidemia    Hypothyroid    Leukopenia 07/07/2017   Personal history of chemotherapy    Personal history of radiation therapy    Stuffy and runny nose 07/22/2017   clear drainage from nose, per pt.    PAST SURGICAL HISTORY: Past Surgical History:  Procedure Laterality Date   BREAST LUMPECTOMY Right 03/26/2017  BREAST LUMPECTOMY WITH RADIOACTIVE SEED AND SENTINEL LYMPH NODE BIOPSY Right 03/26/2017   Procedure: BREAST LUMPECTOMY WITH RADIOACTIVE SEED AND SENTINEL LYMPH NODE BIOPSY;  Surgeon: Jovita Kussmaul, MD;  Location:  [facility];  Service: General;  Laterality: Right;   CARPAL TUNNEL RELEASE Right 01/05/2021   R CTR with RMF trigger release (Dr. Amedeo Plenty)   CATARACT EXTRACTION Left 2021   CHOLECYSTECTOMY     COLONOSCOPY  10/2007   CYST EXCISION  02/01/2003   knee   KNEE ARTHROSCOPY Right 1990   KNEE ARTHROSCOPY Left 12/12/2010   PORT-A-CATH REMOVAL N/A 09/03/2017   Procedure: REMOVAL PORT-A-CATH;  Surgeon: Jovita Kussmaul, MD;  Location: [facility];  Service: General;  Laterality: N/A;   PORTACATH PLACEMENT Left 04/24/2017   Procedure: INSERTION PORT-A-CATH;  Surgeon: Jovita Kussmaul, MD;  Location: [facility];  Service: General;  Laterality: Left;   [place]  09/2009 R, 06/2010 L    FAMILY HISTORY Family History  Problem Relation Age of Onset   Hypertension Mother    Heart disease Mother    Gallbladder disease Mother    Thyroid disease Mother    Arthritis Mother    Diabetes Father    Cancer Father        pancreatic   Hypertension Sister    Gallbladder disease Sister    Diabetes Sister    Diverticulitis Sister        of small intestine   COPD Sister    Stroke Sister        late 50's   Hypertension Sister    Pulmonary embolism Sister    Ulcerative colitis Sister    COPD Brother        had lung lobe removed for suspected cancer, but was benign; smoker   Diabetes Son        great toes amputated at 62   Cancer Son        testicular cancer   Lung cancer Cousin        maternal first cousin - smoker   Colon cancer Other 88  The patient's father died from pancreatic cancer at the age of 3. The patient's mother died from a myocardial infarction at age 5. The patient had one brother, 3 sisters. The patient's older son was diagnosed with testicular cancer at age 50. A maternal niece was diagnosed with colon cancer at age 64 and a maternal cousin with lung cancer at an unknown age.   GYNECOLOGIC HISTORY:  No LMP recorded. Patient  is postmenopausal.  Menarche age 77, first live birth age 46, the patient is [place] P2. she went through the change of life approximately the year 2000. She did not take hormone replacement. She did take oral contraceptives for more than 20 years remotely, with no complications.   SOCIAL HISTORY:  She is a retired Web designer. Her husband Arnette Norris is an Clinical biochemist, still working part-time. Son Corene Cornea (from the patient's first marriage) lives in [place] and works in Therapist, art. Son Catalina Antigua (from patient second marriage) lives in [place] we are is a Administrator, arts. The patient has one grandchild. She is not a Ambulance person.    ADVANCED DIRECTIVES: In the absence of any documentation to the contrary, the patient's spouse is their HCPOA.    HEALTH MAINTENANCE: Social History   Tobacco Use   Smoking status: Never   Smokeless tobacco: Never  Vaping Use   Vaping Use: Never used  Substance Use Topics   Alcohol use: Not Currently    Comment: none since her cancer diagnosis 2018   Drug use: No     Colonoscopy: April 2019/ Dr. Mann/ normal  PAP:  Bone density: 12/27/2014 at the [facility], T score -0.1 (normal)   Allergies  Allergen Reactions   Adhesive [Tape] Other (See Comments)    SKIN TEARS   Ciprofloxacin Nausea Only   Codeine Nausea Only   Iodine Swelling   Polysporin [Bacitracin-Polymyxin B] Swelling    OPHTHALMIC - SWELLING OF EYES   Penicillins Rash        Sulfa Antibiotics Itching    Current Outpatient Medications  Medication Sig Dispense Refill   acetaminophen (TYLENOL) 500 MG tablet Take 1,000 mg by mouth every 6 (six) hours as needed.     azithromycin (ZITHROMAX) 250 MG tablet Take 2 tablets by mouth on first day, then 1 tablet by mouth on days 2 through 5 6 tablet 0   calcium-vitamin D (OSCAL WITH D) 500-200 MG-UNIT tablet Take 1 tablet by mouth daily.     clobetasol cream (TEMOVATE) 0.05 % Apply topically 2 (two) times daily as needed. (Patient not  taking: Reported on 09/12/2021) 60 g 0   Coenzyme Q10 (CO Q-10) 100 MG CAPS Take by mouth daily.     famotidine (PEPCID) 40 MG tablet Take 40 mg by mouth 2 (two) times daily.     fluticasone (FLONASE) 50 MCG/ACT nasal spray Place 2 sprays into both nostrils daily. 48 g 2   guaiFENesin (MUCINEX) 600 MG 12 hr tablet Take 600 mg by mouth 2 (two) times daily.     hydrochlorothiazide (MICROZIDE) 12.5 MG capsule Take 1 capsule by mouth once daily 90 capsule 2   levothyroxine (SYNTHROID) 88 MCG tablet Take 1 tablet by mouth once daily 90 tablet 2   loratadine (CLARITIN) 10 MG tablet Take 10 mg by mouth daily as needed for allergies.      Multiple Vitamins-Minerals (ALIVE WOMENS 50+ PO) Take 1 tablet by mouth daily.      Multiple Vitamins-Minerals (PRESERVISION AREDS 2 PO) Take 1 tablet by mouth in the morning and at bedtime.     Probiotic Product (ALIGN PO) Take 1 capsule by mouth daily.     rosuvastatin (CRESTOR) 10 MG tablet Take 1 tablet by mouth once daily 90 tablet 0   tamoxifen (NOLVADEX) 10 MG tablet Take 1 tablet (10 mg total) by mouth daily. 90 tablet 4   venlafaxine XR (EFFEXOR-XR) 37.5 MG 24 hr capsule Take 1 capsule (37.5 mg total) by mouth daily with breakfast. 90 capsule 4   No current facility-administered medications for this visit.    OBJECTIVE: White woman who appears stated age  41:   05/31/22 1316  BP: (!) 143/80  Resp: 17  Temp: 97.7 F (36.5 C)    Wt Readings from Last 3 Encounters:  05/31/22 169 lb 6 oz (76.8 kg)  10/22/21 162 lb (73.5 kg)  09/12/21 163 lb (73.9 kg)   Body mass index is 29.53 kg/m.    ECOG FS:1 - Symptomatic but completely ambulatory Physical Exam Constitutional:      Appearance: Normal appearance.  Chest:     Comments: Bilateral breasts inspected.  No palpable masses or regional adenopathy. Musculoskeletal:     Cervical back: Normal range of motion and neck supple.  Lymphadenopathy:     Cervical: No cervical adenopathy.  Neurological:      Mental Status: She is alert.  LAB RESULTS:  CMP     Component Value Date/Time   NA 142 05/07/2021 1437   NA 143 09/06/2019 0947   NA 142 08/01/2017 1056   K 4.0 05/07/2021 1437   K 4.4 08/01/2017 1056   CL 107 05/07/2021 1437   CO2 26 05/07/2021 1437   CO2 24 08/01/2017 1056   GLUCOSE 94 09/12/2021 0937   GLUCOSE 108 (H) 05/07/2021 1437   GLUCOSE 101 08/01/2017 1056   BUN 13 05/07/2021 1437   BUN 13 09/06/2019 0947   BUN 11.1 08/01/2017 1056   CREATININE 0.84 05/07/2021 1437   CREATININE 0.8 08/01/2017 1056   CALCIUM 8.9 05/07/2021 1437   CALCIUM 9.6 08/01/2017 1056   PROT 6.6 05/07/2021 1437   PROT 6.5 09/06/2019 0947   PROT 6.6 08/01/2017 1056   ALBUMIN 3.6 05/07/2021 1437   ALBUMIN 3.8 09/06/2019 0947   ALBUMIN 3.6 08/01/2017 1056   AST 22 05/07/2021 1437   AST 18 08/01/2017 1056   ALT 18 05/07/2021 1437   ALT 17 08/01/2017 1056   ALKPHOS 49 05/07/2021 1437   ALKPHOS 67 08/01/2017 1056   BILITOT 0.3 05/07/2021 1437   BILITOT 0.38 08/01/2017 1056   GFRNONAA >60 05/07/2021 1437   GFRAA >60 05/04/2020 1430    No results found for: "TOTALPROTELP", "ALBUMINELP", "A1GS", "A2GS", "BETS", "BETA2SER", "GAMS", "MSPIKE", "SPEI"  No results found for: "KPAFRELGTCHN", "LAMBDASER", "KAPLAMBRATIO"  Lab Results  Component Value Date   WBC 4.7 05/31/2022   NEUTROABS 2.1 05/31/2022   HGB 12.6 05/31/2022   HCT 36.8 05/31/2022   MCV 93.6 05/31/2022   PLT 150 05/31/2022      Chemistry      Component Value Date/Time   NA 142 05/07/2021 1437   NA 143 09/06/2019 0947   NA 142 08/01/2017 1056   K 4.0 05/07/2021 1437   K 4.4 08/01/2017 1056   CL 107 05/07/2021 1437   CO2 26 05/07/2021 1437   CO2 24 08/01/2017 1056   BUN 13 05/07/2021 1437   BUN 13 09/06/2019 0947   BUN 11.1 08/01/2017 1056   CREATININE 0.84 05/07/2021 1437   CREATININE 0.8 08/01/2017 1056      Component Value Date/Time   CALCIUM 8.9 05/07/2021 1437   CALCIUM 9.6 08/01/2017 1056    ALKPHOS 49 05/07/2021 1437   ALKPHOS 67 08/01/2017 1056   AST 22 05/07/2021 1437   AST 18 08/01/2017 1056   ALT 18 05/07/2021 1437   ALT 17 08/01/2017 1056   BILITOT 0.3 05/07/2021 1437   BILITOT 0.38 08/01/2017 1056       No results found for: "LABCA2"  No components found for: "YYQMGN003"  No results for input(s): "INR" in the last 168 hours.  Urinalysis    Component Value Date/Time   COLORURINE YELLOW 05/02/2017 2201   APPEARANCEUR HAZY (A) 05/02/2017 2201   LABSPEC 1.015 10/21/2018 1107   PHURINE 6.0 05/02/2017 2201   GLUCOSEU NEGATIVE 05/02/2017 2201   HGBUR SMALL (A) 05/02/2017 2201   BILIRUBINUR negative 10/21/2018 1107   BILIRUBINUR neg 08/17/2014 1123   KETONESUR negative 10/21/2018 1107   KETONESUR NEGATIVE 05/02/2017 2201   PROTEINUR negative 10/21/2018 1107   PROTEINUR NEGATIVE 05/02/2017 2201   UROBILINOGEN negative 08/17/2014 1123   NITRITE Negative 10/21/2018 1107   NITRITE POSITIVE (A) 05/02/2017 2201   LEUKOCYTESUR Negative 10/21/2018 1107    STUDIES: No results found.   ELIGIBLE FOR AVAILABLE RESEARCH PROTOCOL: no  ASSESSMENT: 76 y.o. [place] woman status post right breast upper inner  quadrant biopsy 03/04/2015 for a clinical T1a N0, stage IA invasive ductal carcinoma, grade 1, estrogen receptor positive, progesterone receptor negative, with an MIB-1 of 3% and no HER-2 amplification  (1) anastrozole started neoadjuvantly 03/12/2017, held at the start of chemotherapy, resumed late November 2018  (2) genetics testing 0 03/2017 through the  common hereditary cancer panel offered by [facility] found no deleterious mutations in APC, ATM, AXIN2, BARD1, BMPR1A, BRCA1, BRCA2, BRIP1, CDH1, CDKN2A (p14ARF), CDKN2A (p16INK4a), CHEK2, CTNNA1, DICER1, EPCAM (Deletion/duplication testing only), GREM1 (promoter region deletion/duplication testing only), KIT, MEN1, MLH1, MSH2, MSH3, MSH6, MUTYH, NBN, NF1, NHTL1, PALB2, PDGFRA, PMS2, POLD1, POLE, PTEN, RAD50, RAD51C,  RAD51D, SDHB, SDHC, SDHD, SMAD4, SMARCA4. STK11, TP53, TSC1, TSC2, and VHL.  The following genes were evaluated for sequence changes only: SDHA and HOXB13 c.251G>A variant only.     (3) right lumpectomy and sentinel lymph node biopsy 03/26/2017 showed a pT1c pN1, stage IB invasive ductal carcinoma, grade 1, with negative margins  (4) Mammaprint returned high risk, indicating need for adjuvant chemotherapy  (5) adjuvant chemotherapy consisting of Cytoxan and docetaxel given every 3 weeks 4 beginning on 04/28/2017  (a) changed to Doxorubicin and Cyclophosphamide beginning 05/19/2017, completing 3 cycles 06/30/2017  (b) dose reduced by 20% with the second and third CA treatments  (6) Adjuvant radiation completed 08/28/2017 Site/dose:   1) Right breast and axilla treated to 42.56 Gy with 16 fx of 2.66  2)  boost of 8 Gy with 4 fx of 2 Gy  (7) Anastrozole started in 06/2017  (a) DEXA scan 02/24/2018 showed a T score of -0.6 normal  (b) anastrozole stopped 04/09/2018--symptoms  (c) tamoxifen started September 2019  (d) tamoxifen dose decreased to 10 mg daily as of September 2020   PLAN: Ms. Neima is now status post almost 5 years of antiestrogen therapy.  She is hoping to discontinue this since tamoxifen even at 10 mg has been causing her a lot of hot flashes.  She was specifically instructed to wean off of Effexor and not discontinue it suddenly.  No concerns on physical exam.  She tells me she has an OB/GYN who can do an annual breast exam and hence she can return to clinic as needed.  She was of course encouraged to continue mammograms every year and return to clinic as needed and she appreciates everything.  Total time spent: 20 min *Total Encounter Time as defined by the Centers for Medicare and Medicaid Services includes, in addition to the face-to-face time of a patient visit (documented in the note above) non-face-to-face time: obtaining and reviewing outside history, ordering and  reviewing medications, tests or procedures, care coordination (communications with other health care professionals or caregivers) and documentation in the medical record.

## 2022-06-18 ENCOUNTER — Ambulatory Visit
Admission: EM | Admit: 2022-06-18 | Discharge: 2022-06-18 | Disposition: A | Discharge status: Home / Self Care | Payer: Medicare HMO | Attending: Nurse Practitioner | Admitting: Nurse Practitioner

## 2022-06-18 ENCOUNTER — Other Ambulatory Visit: Payer: Self-pay

## 2022-06-18 ENCOUNTER — Encounter: Payer: Self-pay | Admitting: Emergency Medicine

## 2022-06-18 DIAGNOSIS — B9689 Other specified bacterial agents as the cause of diseases classified elsewhere: Secondary | ICD-10-CM

## 2022-06-18 DIAGNOSIS — J019 Acute sinusitis, unspecified: Secondary | ICD-10-CM | POA: Diagnosis not present

## 2022-06-18 MED ORDER — DOXYCYCLINE HYCLATE 100 MG PO TABS: 100.0000 mg | ORAL_TABLET | Freq: Two times a day (BID) | ORAL | 0 refills | Status: AC

## 2022-06-18 NOTE — Discharge Instructions (Addendum)
Take medication as directed. Continue your current allergy medication regimen. Increase fluids and get plenty of rest. May take over-the-counter ibuprofen or Tylenol as needed for pain, fever, or general discomfort. Recommend normal saline nasal spray to help with nasal congestion throughout the day. For your cough, it may be helpful to use a humidifier at bedtime during sleep. Follow-up with your primary care physician if symptoms do not improve after completing these medications.

## 2022-06-18 NOTE — ED Provider Notes (Signed)
[facility]    CSN: [id number] Arrival date & time: 06/18/22  1518      History   Chief Complaint Chief Complaint  Patient presents with   Facial Pain    HPI Mckenzie Webb is a 76 y.o. female.   The history is provided by the patient.   Patient presents with a 3-week history of headache, facial pressure, postnasal drainage, right ear pain, cough, and fatigue.  She states that she has pain in the right side of her face that is similar to a tooth ache, and pain behind the right eye.  She states that she has not had fever, chills, sore throat, abdominal pain, nausea, vomiting, or diarrhea.  Patient reports that usually when this happens she has a sinus infection.  She states she has been taking over-the-counter allergy medications and medications for cough with minimal relief.  Past Medical History:  Diagnosis Date   Arthritis    lower back   Cough 07/22/2017   Dental crowns present    Family history of adverse reaction to anesthesia    pt's sister has hx. of post-op N/V   GERD (gastroesophageal reflux disease)    History of chemotherapy    finished chemo 06/30/2017   History of radiation therapy 07/29/17- 08/28/17   Right Breast and Axilla treated to 42.56 Gy with 16 fx of 2.66. Boost of 8 Gy with 4 fx of 2 Gy.    History of right breast cancer 02/2017   Hyperlipidemia    Hypothyroid    Leukopenia 07/07/2017   Personal history of chemotherapy    Personal history of radiation therapy    Stuffy and runny nose 07/22/2017   clear drainage from nose, per pt.    Patient Active Problem List   Diagnosis Date Noted   Senile purpura ([place]) 03/10/2020   Plant dermatitis 12/13/2019   Hot flashes due to tamoxifen 09/06/2019   Morbid exogenous obesity ([place]) 09/28/2017   Port catheter in place 04/28/2017   Genetic testing 04/17/2017   Family history of pancreatic cancer    Family history of colon cancer    Malignant neoplasm of upper-inner quadrant of right  breast in female, estrogen receptor positive ([place]) 03/11/2017   Essential hypertension, benign 07/31/2015   IFG (impaired fasting glucose) 05/03/2015   Psoriasis 05/03/2014   Greater trochanteric bursitis of right hip 03/14/2014   Obesity (BMI 30-39.9) 05/26/2013   GERD (gastroesophageal reflux disease) 12/31/2010   Hypothyroidism 12/31/2010   Vitamin D deficiency 12/31/2010   Pure hypercholesterolemia 12/31/2010    Past Surgical History:  Procedure Laterality Date   BREAST LUMPECTOMY Right 03/26/2017   BREAST LUMPECTOMY WITH RADIOACTIVE SEED AND SENTINEL LYMPH NODE BIOPSY Right 03/26/2017   Procedure: BREAST LUMPECTOMY WITH RADIOACTIVE SEED AND SENTINEL LYMPH NODE BIOPSY;  Surgeon: Jovita Kussmaul, MD;  Location: [facility];  Service: General;  Laterality: Right;   CARPAL TUNNEL RELEASE Right 01/05/2021   R CTR with RMF trigger release (Dr. Amedeo Plenty)   CATARACT EXTRACTION Left 2021   CHOLECYSTECTOMY     COLONOSCOPY  10/2007   CYST EXCISION  02/01/2003   knee   KNEE ARTHROSCOPY Right 1990   KNEE ARTHROSCOPY Left 12/12/2010   PORT-A-CATH REMOVAL N/A 09/03/2017   Procedure: REMOVAL PORT-A-CATH;  Surgeon: Jovita Kussmaul, MD;  Location: [facility];  Service: General;  Laterality: N/A;   PORTACATH PLACEMENT Left 04/24/2017   Procedure: INSERTION PORT-A-CATH;  Surgeon: Jovita Kussmaul, MD;  Location: [facility];  Service: General;  Laterality: Left;   TUBAL LIGATION  1983   VARICOSE VEIN SURGERY  09/2009 R, 06/2010 L    OB History     Gravida  2   Para  2   Term      Preterm      AB      Living  2      SAB      IAB      Ectopic      Multiple      Live Births               Home Medications    Prior to Admission medications   Medication Sig Start Date End Date Taking? Authorizing Provider  doxycycline (VIBRA-TABS) 100 MG tablet Take 1 tablet (100 mg total) by mouth 2 (two) times daily for 10 days. 06/18/22 06/28/22 Yes Charlsie Fleeger-Warren,  Alda Lea, NP  acetaminophen (TYLENOL) 500 MG tablet Take 1,000 mg by mouth every 6 (six) hours as needed.    [provider]  azithromycin (ZITHROMAX) 250 MG tablet Take 2 tablets by mouth on first day, then 1 tablet by mouth on days 2 through 5 11/01/21   Rita Ohara, MD  calcium-vitamin D (OSCAL WITH D) 500-200 MG-UNIT tablet Take 1 tablet by mouth daily.    [provider]  clobetasol cream (TEMOVATE) 0.05 % Apply topically 2 (two) times daily as needed. Patient not taking: Reported on 09/12/2021 04/27/20   Rita Ohara, MD  Coenzyme Q10 (CO Q-10) 100 MG CAPS Take by mouth daily.    [provider]  famotidine (PEPCID) 40 MG tablet Take 40 mg by mouth 2 (two) times daily.    [provider]  fluticasone (FLONASE) 50 MCG/ACT nasal spray Place 2 sprays into both nostrils daily. 09/27/21   Rita Ohara, MD  guaiFENesin (MUCINEX) 600 MG 12 hr tablet Take 600 mg by mouth 2 (two) times daily.    [provider]  hydrochlorothiazide (MICROZIDE) 12.5 MG capsule Take 1 capsule by mouth once daily 10/22/21   Rita Ohara, MD  levothyroxine (SYNTHROID) 88 MCG tablet Take 1 tablet by mouth once daily 12/12/21   Rita Ohara, MD  loratadine (CLARITIN) 10 MG tablet Take 10 mg by mouth daily as needed for allergies.     [provider]  Multiple Vitamins-Minerals (ALIVE WOMENS 50+ PO) Take 1 tablet by mouth daily.     [provider]  Multiple Vitamins-Minerals (PRESERVISION AREDS 2 PO) Take 1 tablet by mouth in the morning and at bedtime.    [provider]  Probiotic Product (ALIGN PO) Take 1 capsule by mouth daily.    [provider]  rosuvastatin (CRESTOR) 10 MG tablet Take 1 tablet by mouth once daily Patient taking differently: Every other day 04/05/22   Rita Ohara, MD  tamoxifen (NOLVADEX) 10 MG tablet Take 1 tablet (10 mg total) by mouth daily. 05/07/21   Magrinat, Virgie Dad, MD  venlafaxine XR (EFFEXOR-XR) 37.5 MG 24 hr capsule Take 1  capsule (37.5 mg total) by mouth daily with breakfast. 05/07/21   Magrinat, Virgie Dad, MD    Family History Family History  Problem Relation Age of Onset   Hypertension Mother    Heart disease Mother    Gallbladder disease Mother    Thyroid disease Mother    Arthritis Mother    Diabetes Father    Cancer Father        pancreatic   Hypertension Sister    Gallbladder  disease Sister    Diabetes Sister    Diverticulitis Sister        of small intestine   COPD Sister    Stroke Sister        late 50's   Hypertension Sister    Pulmonary embolism Sister    Ulcerative colitis Sister    COPD Brother        had lung lobe removed for suspected cancer, but was benign; smoker   Diabetes Son        great toes amputated at 10   Cancer Son        testicular cancer   Lung cancer Cousin        maternal first cousin - smoker   Colon cancer Other 39    Social History Social History   Tobacco Use   Smoking status: Never   Smokeless tobacco: Never  Vaping Use   Vaping Use: Never used  Substance Use Topics   Alcohol use: Not Currently    Comment: none since her cancer diagnosis 2018   Drug use: No     Allergies   Adhesive [tape], Ciprofloxacin, Codeine, Iodine, Polysporin [bacitracin-polymyxin b], Penicillins, and Sulfa antibiotics   Review of Systems Review of Systems Per HPI  Physical Exam Triage Vital Signs ED Triage Vitals [06/18/22 1630]  Enc Vitals Group     BP (!) 173/92     Pulse Rate 88     Resp 18     Temp 98 F (36.7 C)     Temp Source Oral     SpO2 97 %     Weight      Height      Head Circumference      Peak Flow      Pain Score 3     Pain Loc      Pain Edu?      Excl. in [place]?    No data found.  Updated Vital Signs BP (!) 173/92 (BP Location: Right Arm)   Pulse 88   Temp 98 F (36.7 C) (Oral)   Resp 18   SpO2 97%   Visual Acuity Right Eye Distance:   Left Eye Distance:   Bilateral Distance:    Right Eye Near:   Left Eye Near:     Bilateral Near:     Physical Exam Vitals and nursing note reviewed.  Constitutional:      General: She is not in acute distress.    Appearance: Normal appearance.  HENT:     Head: Normocephalic.     Right Ear: Tympanic membrane, ear canal and external ear normal.     Left Ear: Tympanic membrane, ear canal and external ear normal.     Nose: Congestion present.     Right Turbinates: Enlarged and swollen.     Left Turbinates: Enlarged and swollen.     Right Sinus: Maxillary sinus tenderness and frontal sinus tenderness present.     Left Sinus: No maxillary sinus tenderness or frontal sinus tenderness.     Mouth/Throat:     Lips: Pink.     Mouth: Mucous membranes are moist.     Pharynx: Oropharynx is clear. Uvula midline. Posterior oropharyngeal erythema present. No pharyngeal swelling, oropharyngeal exudate or uvula swelling.  Eyes:     Extraocular Movements: Extraocular movements intact.     Conjunctiva/sclera: Conjunctivae normal.     Pupils: Pupils are equal, round, and reactive to light.  Cardiovascular:     Rate and Rhythm: Normal  rate and regular rhythm.     Pulses: Normal pulses.     Heart sounds: Normal heart sounds.  Pulmonary:     Effort: Pulmonary effort is normal. No respiratory distress.     Breath sounds: Normal breath sounds. No stridor. No wheezing, rhonchi or rales.  Abdominal:     General: Bowel sounds are normal.     Palpations: Abdomen is soft.     Tenderness: There is no abdominal tenderness.  Musculoskeletal:     Cervical back: Normal range of motion.  Lymphadenopathy:     Cervical: No cervical adenopathy.  Skin:    General: Skin is warm and dry.  Neurological:     General: No focal deficit present.     Mental Status: She is alert and oriented to person, place, and time.  Psychiatric:        Mood and Affect: Mood normal.        Behavior: Behavior normal.      UC Treatments / Results  Labs (all labs ordered are listed, but only abnormal results  are displayed) Labs Reviewed - No data to display  EKG   Radiology No results found.  Procedures Procedures (including critical care time)  Medications Ordered in UC Medications - No data to display  Initial Impression / Assessment and Plan / UC Course  I have reviewed the triage vital signs and the nursing notes.  Pertinent labs & imaging results that were available during my care of the patient were reviewed by me and considered in my medical decision making (see chart for details).  Symptoms consistent with bacterial sinusitis based on the duration of the symptoms and failure for improvement with over-the-counter medications.  Patient is well-appearing, she is in no acute distress, she is hypertensive, but otherwise stable.  Will start patient on doxycycline 100 mg for her symptoms.  Patient was advised to continue use of her allergy medications including Flonase.  Supportive care recommendations were provided to the patient.  Patient verbalizes understanding.  All questions were answered.  Patient is stable for discharge. Final Clinical Impressions(s) / UC Diagnoses   Final diagnoses:  Acute bacterial sinusitis     Discharge Instructions      Take medication as directed. Continue your current allergy medication regimen. Increase fluids and get plenty of rest. May take over-the-counter ibuprofen or Tylenol as needed for pain, fever, or general discomfort. Recommend normal saline nasal spray to help with nasal congestion throughout the day. For your cough, it may be helpful to use a humidifier at bedtime during sleep. Follow-up with your primary care physician if symptoms do not improve after completing these medications.     ED Prescriptions     Medication Sig Dispense Auth. Provider   doxycycline (VIBRA-TABS) 100 MG tablet Take 1 tablet (100 mg total) by mouth 2 (two) times daily for 10 days. 20 tablet Landon Truax-Warren, Alda Lea, NP      PDMP not reviewed this  encounter.   Tish Men, NP 06/18/22 1702

## 2022-06-18 NOTE — ED Triage Notes (Signed)
Pt reports facial pressure, right ear pain x3 weeks. Pt reports unable to get in with pcp and reports history of similar in the past and reports was prescribed z-pack. Pt reports has taken otc medication and no change in symptoms.

## 2022-06-26 ENCOUNTER — Other Ambulatory Visit: Payer: Self-pay | Admitting: Family Medicine

## 2022-06-26 DIAGNOSIS — E7800 Pure hypercholesterolemia, unspecified: Secondary | ICD-10-CM

## 2022-07-02 NOTE — Progress Notes (Unsigned)
Start time: End time:  Virtual Visit via Video Note  I connected with Mckenzie Webb on 07/02/22 by a video enabled telemedicine application and verified that I am speaking with the correct person using two identifiers.  Location: Patient: *** Provider: office   I discussed the limitations of evaluation and management by telemedicine and the availability of in person appointments. The patient expressed understanding and agreed to proceed.  History of Present Illness:  No chief complaint on file.     Observations/Objective:  There were no vitals taken for this visit.   Assessment and Plan:   Follow Up Instructions:    I discussed the assessment and treatment plan with the patient. The patient was provided an opportunity to ask questions and all were answered. The patient agreed with the plan and demonstrated an understanding of the instructions.   The patient was advised to call back or seek an in-person evaluation if the symptoms worsen or if the condition fails to improve as anticipated.  I spent *** minutes dedicated to the care of this patient, including pre-visit review of records, face to face time, post-visit ordering of testing and documentation.    Vikki Ports, MD

## 2022-07-03 ENCOUNTER — Telehealth (INDEPENDENT_AMBULATORY_CARE_PROVIDER_SITE_OTHER): Payer: Medicare HMO | Admitting: Family Medicine

## 2022-07-03 ENCOUNTER — Encounter: Payer: Self-pay | Admitting: Family Medicine

## 2022-07-03 VITALS — BP 126/77 | HR 76 | Temp 97.8°F | Ht 63.5 in | Wt 166.0 lb

## 2022-07-03 DIAGNOSIS — J019 Acute sinusitis, unspecified: Secondary | ICD-10-CM

## 2022-07-03 MED ORDER — DOXYCYCLINE HYCLATE 100 MG PO CAPS: 100.0000 mg | ORAL_CAPSULE | Freq: Two times a day (BID) | ORAL | 0 refills | Status: DC

## 2022-07-03 NOTE — Patient Instructions (Signed)
If in 5-7 days you don't notice any improvement in the color and amount of phlegm, then we should switch antibiotics.  I'm sticking the doxycycline  since it sounds as though you got mostly better, and only got worse again upon completion of the 10 day course.  I think you need a longer course to fully treat the sinus infection  If you have recurrent sinus pain or congestion, start sinus rinses, once or twice daily. Don't take the Robitussin that says "cold"--this has the decongestant that will raise your blood pressure. Instead, take Mucinex DM 12 hour tablet, and take it twice daily. You can continue tylenol if needed for pain. Contact us within a day or so if the Mucinex DM isn't doing a good enough job at stopping your cough. We can send in a prescription for benzonatate (tessalon perles) to help with your cough while you're on the antibiotics.

## 2022-07-23 DIAGNOSIS — D1801 Hemangioma of skin and subcutaneous tissue: Secondary | ICD-10-CM | POA: Diagnosis not present

## 2022-07-23 DIAGNOSIS — L400 Psoriasis vulgaris: Secondary | ICD-10-CM | POA: Diagnosis not present

## 2022-07-23 DIAGNOSIS — L570 Actinic keratosis: Secondary | ICD-10-CM | POA: Diagnosis not present

## 2022-07-23 DIAGNOSIS — L814 Other melanin hyperpigmentation: Secondary | ICD-10-CM | POA: Diagnosis not present

## 2022-07-23 DIAGNOSIS — L821 Other seborrheic keratosis: Secondary | ICD-10-CM | POA: Diagnosis not present

## 2022-07-23 DIAGNOSIS — D225 Melanocytic nevi of trunk: Secondary | ICD-10-CM | POA: Diagnosis not present

## 2022-07-23 DIAGNOSIS — B356 Tinea cruris: Secondary | ICD-10-CM | POA: Diagnosis not present

## 2022-07-29 ENCOUNTER — Other Ambulatory Visit: Payer: Self-pay | Admitting: Family Medicine

## 2022-07-29 DIAGNOSIS — I10 Essential (primary) hypertension: Secondary | ICD-10-CM

## 2022-07-30 ENCOUNTER — Other Ambulatory Visit: Payer: Self-pay | Admitting: Family Medicine

## 2022-07-30 DIAGNOSIS — I10 Essential (primary) hypertension: Secondary | ICD-10-CM

## 2022-08-01 ENCOUNTER — Encounter: Payer: Self-pay | Admitting: Family Medicine

## 2022-08-01 ENCOUNTER — Ambulatory Visit (INDEPENDENT_AMBULATORY_CARE_PROVIDER_SITE_OTHER): Payer: Medicare HMO | Admitting: Family Medicine

## 2022-08-01 VITALS — BP 128/76 | HR 80 | Temp 98.5°F | Ht 63.5 in | Wt 170.0 lb

## 2022-08-01 DIAGNOSIS — R051 Acute cough: Secondary | ICD-10-CM

## 2022-08-01 LAB — POC COVID19 BINAXNOW: SARS Coronavirus 2 Ag: NEGATIVE

## 2022-08-01 MED ORDER — BENZONATATE 200 MG PO CAPS: 200.0000 mg | ORAL_CAPSULE | Freq: Three times a day (TID) | ORAL | 0 refills | Status: DC | PRN

## 2022-08-01 NOTE — Patient Instructions (Signed)
Stay well hydrated. Continue the Mucinex DM. You may use the benzonatate as needed for cough. If this isn't helping, and if you're not able to sleep due to the cough, contact us for a cough syrup (we briefly discussed hycodan syrup, with hydrocodone). Contact us if you develop a fever, if your cough becomes productive of discolored phlegm, if you develop shortness of breath  We are doing a COVID test today--check your MyChart for the results. If it is positive, we will treat with an antiviral medication.

## 2022-08-01 NOTE — Progress Notes (Signed)
Chief Complaint  Patient presents with   Cough    Started back in Oct. Went to UC in [place]. Then we did virtual, got a little better. Night before last cough started again. Has not done any covid tests.    She had a sinus infection in late October, which required additional ABX at her f/u visit. She reports that her cough lingered some.  Sinus symptoms resolved. She continues to take Mucinex DM, which had been helping.  On the night of 12/12 she developed a recurrent cough. Denies any runny nose, sniffling or sneezing. She has some PND. She has some discomfort in her chest when coughing. Cough is not productive.  It seems to be worse later in the evening. Had some coughing last night, but was able to sleep some. Denies shortness of breath or pain with breathing.  OTC meds used: Mucinex DM, tylenol Cough drops help some.  No sick contacts  PMH, PSH, SH reviewed  Outpatient Encounter Medications as of 08/01/2022  Medication Sig Note   acetaminophen (TYLENOL) 500 MG tablet Take 1,000 mg by mouth every 6 (six) hours as needed. 10/22/2021: Last dose 8:30am   calcium-vitamin D (OSCAL WITH D) 500-200 MG-UNIT tablet Take 1 tablet by mouth daily.    clobetasol cream (TEMOVATE) 0.05 % Apply topically 2 (two) times daily as needed.    Coenzyme Q10 (CO Q-10) 100 MG CAPS Take by mouth daily.    dextromethorphan-guaiFENesin (MUCINEX DM) 30-600 MG 12hr tablet Take 1 tablet by mouth 2 (two) times daily. 08/01/2022: Last dose this am   hydrochlorothiazide (MICROZIDE) 12.5 MG capsule Take 1 capsule by mouth once daily    levothyroxine (SYNTHROID) 88 MCG tablet Take 1 tablet by mouth once daily    Multiple Vitamins-Minerals (ALIVE WOMENS 50+ PO) Take 1 tablet by mouth daily.     Multiple Vitamins-Minerals (PRESERVISION AREDS 2 PO) Take 1 tablet by mouth in the morning and at bedtime.    Probiotic Product (ALIGN PO) Take 1 capsule by mouth daily.    rosuvastatin (CRESTOR) 10 MG tablet Take 1 tablet  by mouth once daily (Patient taking differently: Every other day)    calcium carbonate (TUMS - DOSED IN MG ELEMENTAL CALCIUM) 500 MG chewable tablet Chew 1 tablet by mouth daily. (Patient not taking: Reported on 08/01/2022) 07/03/2022: As needed   famotidine (PEPCID) 40 MG tablet Take 40 mg by mouth 2 (two) times daily. (Patient not taking: Reported on 07/03/2022) 08/01/2022: As needed   fluticasone (FLONASE) 50 MCG/ACT nasal spray Place 2 sprays into both nostrils daily. (Patient not taking: Reported on 08/01/2022) 08/01/2022: As needed   loratadine (CLARITIN) 10 MG tablet Take 10 mg by mouth daily as needed for allergies.  (Patient not taking: Reported on 08/01/2022) 08/01/2022: prn   [DISCONTINUED] doxycycline (VIBRAMYCIN) 100 MG capsule Take 1 capsule (100 mg total) by mouth 2 (two) times daily.    [DISCONTINUED] guaiFENesin (MUCINEX) 600 MG 12 hr tablet Take 600 mg by mouth 2 (two) times daily. (Patient not taking: Reported on 07/03/2022) 07/03/2022: Took last week   [DISCONTINUED] Pseudoephedrine-DM-GG (ROBITUSSIN COLD & COUGH PO) Take 15 mLs by mouth as needed. 07/03/2022: Took last night   No facility-administered encounter medications on file as of 08/01/2022.   Allergies  Allergen Reactions   Adhesive [Tape] Other (See Comments)    SKIN TEARS   Ciprofloxacin Nausea Only   Codeine Nausea Only   Iodine Swelling   Polysporin [Bacitracin-Polymyxin B] Swelling    OPHTHALMIC - SWELLING OF  EYES   Penicillins Rash        Sulfa Antibiotics Itching   ROS:  No fever or chills, no n/v/d. No exertional chest pain. No shortness of breath. No bleeding, bruising or rashes. No myalgias. Cough per HPI.   PHYSICAL EXAM:  BP 128/76   Pulse 80   Temp 98.5 F (36.9 C) (Tympanic)   Ht 5' 3.5" (1.613 m)   Wt 170 lb (77.1 kg)   BMI 29.64 kg/m  Wt Readings from Last 3 Encounters:  08/01/22 170 lb (77.1 kg)  07/03/22 166 lb (75.3 kg)  05/31/22 169 lb 6 oz (76.8 kg)   Pleasant female.  Frequent hacky cough during the visit. Not occurring with coughing or with deep breaths, just throughout the visit. She is speaking comfortably, in no distress HEENT: conjunctiva and sclera are clear, EOMI.  TM's and EAC's normal. Deviated septum.  R nasal mucosa is mildly edematous with some mucus noted.  L is clear.  OP is clear. Sinuses are nontender Neck: no lymphadenopathy or mass Heart: regular rate and rhythm, no murmur Lungs: clear bilaterally.  Good air movement, no wheezes, rales, ronchi. No coughing triggered by deep breaths Extremities: no edema, bandage from recent biopsy (L inner ankle and L shin) Neuro: alert and oriented, cranial nerves grossly intact, normal gait Psych: normal mood, affect, hygiene and grooming   Rapid COVID negative   ASSESSMENT/PLAN:  Acute cough - nonproductive. Poss PND contributing. COVID negative.  Supportive measures reviewed. f/u if fever, shortness of breath, purulent phlegm, persists/worsens - Plan: benzonatate (TESSALON) 200 MG capsule, POC COVID-19  (We had discussed treatment with molnupiravir if COVID test returned +)   Stay well hydrated. Continue the Mucinex DM. You may use the benzonatate as needed for cough. If this isn't helping, and if you're not able to sleep due to the cough, contact us for a cough syrup (we briefly discussed hycodan syrup, with hydrocodone). Contact us if you develop a fever, if your cough becomes productive of discolored phlegm, if you develop shortness of breath  We are doing a COVID test today--check your MyChart for the results. If it is positive, we will treat with an antiviral medication.

## 2022-08-04 ENCOUNTER — Encounter: Payer: Self-pay | Admitting: Family Medicine

## 2022-08-05 MED ORDER — HYDROCODONE BIT-HOMATROP MBR 5-1.5 MG/5ML PO SOLN: 5.0000 mL | Freq: Every evening | ORAL | 0 refills | Status: DC | PRN

## 2022-08-07 MED ORDER — CLARITHROMYCIN 500 MG PO TABS: 500.0000 mg | ORAL_TABLET | Freq: Two times a day (BID) | ORAL | 0 refills | Status: DC

## 2022-09-15 NOTE — Progress Notes (Unsigned)
No chief complaint on file.  Mckenzie Webb is a 77 y.o. female who presents for annual wellness visit and follow-up on chronic medical conditions.    Hypercholesterolemia:  She is compliant with taking Crestor '10mg'$  every other day (she had myalgias, "weakness" in her legs when it was taken daily, despite using coenzyme Q10; also previously didn't tolerate lipitor, achey in legs, "felt like she couldn't move").  TG were elevated on last check, related to some sweets (had chocolate pie the night before, had reported having 1-2 cookies with her coffee each morning, and other sweets 2x/week). She was encouraged to cut back on sweets.  Today she reports Mckenzie Webb and eggs once a week, uses 2% milk in her ceral, red meat 2x/week or less (more in the summer, related to grilling).  Avoid fried foods. Due for recheck today. Lab Results  Component Value Date   CHOL 196 09/12/2021   HDL 49 09/12/2021   LDLCALC 106 (H) 09/12/2021   TRIG 237 (H) 09/12/2021   CHOLHDL 4.0 09/12/2021   Hypertension:  BP's are checked occasionally, and have been running   She is compliant with HCTZ 12.5 mg, denies side effects. She eats bananas in her diet. She denies muscle cramps. Denies headaches, dizziness, chest pain, shortness of breath or edema. She reports her monitor was verified as accurate in the past.   BP Readings from Last 3 Encounters:  08/01/22 128/76  07/03/22 126/77  06/18/22 (!) 173/92     Breast Cancer: She was diagnosed in 02/2017 with invasive ductal carcinoma, grade 1, estrogen receptor 100% positive, and is s/p right lumpectomy and sentinel lymph node biopsy in 03/2017. Mammaprint returned high risk, so she underwent adjuvant chemotherapy, and also completed radiation. She started on Anastrozole in 06/2017, switched to tamoxifen in 04/2018 due to excessive fatigue.  Dose was lowered to '10mg'$  daily, and completed 5 years of treatment (stopped 05/2022). She last saw oncologist in October.  Was  advised to taper off the Effexor after stopping the tamoxifen. She denies any breast concerns.  UPDATE IF ANY HOT FLASHES   Hypothyroidism:  She reports compliance with her medications.  Dose was increased from 75 to 88 mcg in January 2023--TSH was 5.380 and she repoted fatigue.  Recheck of TSH in March was at goal.  She denies any changes in hair/skin/nails, bowels, moods. "I'Mckenzie always dry"--skin, scalp, but this is no different for her.     Lab Results  Component Value Date   TSH 1.310 10/31/2021    Psoriasis:  She reports this is under control currently. She uses clobetasol prn. She notes that her skin clears up for a few weeks after her epidural injections for her back (if flaring at the time).  She used to help control her psoriasis by using tanning bed, but none since she was diagnosed with cancer 2018. She sees dermatologist yearly for skin checks. She last saw dermatologist in December, and biopsies of L shin, ankle and arm showed AK's (tibia and ankle were both AK and SK).   GERD--She is under the care of Dr. Collene Mares.  Shehad been on nightly Omeprazole '40mg'$  for a long time, and had been switched to famotidine '40mg'$  BID.  She is doing well on this regimen (??UPDATE) She continues on probiotic daily (Align??)   Low back pain:  Has been under the care of neurosurgeon for years, getting epidural injections.  She gets this q 3 months (??UPDATE)--February, June and September of 2023. Still has issues with pain  into the buttock no longer into the leg. She has pain with prolonged standing.  UPDATE.   H/o IFG:  Tries to limit sweets/desserts to twice a week.  In addition, she has 1-2 cookies (vanilla Oreos) with coffee most days.  Only an occasional sweet tea.  Lab Results  Component Value Date   HGBA1C 5.7 (H) 09/12/2021     Immunization History  Administered Date(s) Administered   Fluad Quad(high Dose 65+) 06/18/2019, 06/05/2020, 07/06/2021   Influenza Split 06/20/2011, 06/19/2012,  05/25/2014   Influenza Whole 05/19/2000, 05/19/2001   Influenza, High Dose Seasonal PF 05/23/2016, 06/04/2018   Influenza,inj,Quad PF,6+ Mos 08/01/2017   Influenza-Unspecified 05/19/2014, 05/24/2015, 04/19/2020   Moderna Sars-Covid-2 Vaccination 09/24/2019, 10/23/2019, 08/03/2020   Pneumococcal Conjugate-13 07/26/2013   Pneumococcal Polysaccharide-23 06/16/2000, 07/27/2014   Td 04/13/2001   Tdap 12/31/2010   Zoster, Live 07/26/2013   Last Pap smear: 04/2021, normal Last mammogram: 02/2022 Last colonoscopy: 12/2017 Dr. Collene Mares, told 84yrf/u (if any)   Last DEXA: 02/2018, normal Dentist: every 6 months   Ophtho: yearly Exercise:   Less in the winter--3 days a week x 20 minutes (walking with weights, leg lifts). Tends to walk more in the Spring. Vitamin D level was normal in 2012   Patient Care Team: Mckenzie Ohara MD as PCP - General (Family Medicine) Mckenzie Kussmaul MD as Consulting Physician (General Surgery) Mckenzie Webb, Mckenzie Dad MD (Inactive) as Consulting Physician (Oncology) Mckenzie Gibson MD as Attending Physician (Radiation Oncology) MJuanita Craver MD as Consulting Physician (Gastroenterology) WVania Rea MD as Consulting Physician (Obstetrics and Gynecology) Mckenzie Saas MD as Consulting Physician (Orthopedic Surgery) Mckenzie Amy, MD as Consulting Physician (Dermatology) CDelice Bison LCharlestine Massed NP as Nurse Practitioner (Hematology and Oncology) WVania Rea MD as Consulting Physician (Obstetrics and Gynecology) CMadelin Headings DO (Optometry) GWarden Fillers MD as Consulting Physician (Ophthalmology) OKathie Rhodes MD (Inactive) as Referring Physician (Urology) EReece Agar MD as Consulting Physician (Pain Medicine) Dr. PGerald Webb DDS as Consulting Physician (Dentistry)  Dr. IAnne Webb(oncology, since Dr. MJana Hakimretired)  Depression Screening: [place] from [place]12/20[place] in [facility] PHQ-2 Total Score 0        Falls screen:      09/12/2021    8:43 AM 09/11/2020    8:29 AM 09/06/2019    8:39 AM 09/03/2018    8:42 AM 09/30/2017    2:24 PM  FTopaz Ranch Estatesin the past year? 0 0 0 0 No  Number falls in past yr: 0      Injury with Fall? 0      Risk for fall due to : No Fall Risks      Follow up Falls evaluation completed         Functional Status Survey:         End of Life Discussion:  Patient does not have a living will and medical power of attorney. She was given forms in the past, filled them out, but hasn't gotten them notarized yet.  They are still sitting at home. Same as last year. UPDATE   PMH, PSH, SH and FH reviewed and updated      ROS: The patient denies anorexia, fever, decreased hearing, ear pain, sore throat, breast concerns, chest pain, palpitations, dizziness, syncope, dyspnea on exertion, swelling, nausea, vomiting, melena, hematochezia, hematuria, incontinence, dysuria, vaginal bleeding, discharge, odor or itch, genital lesions, numbness, tingling, weakness, tremor, suspicious skin lesions, depression, anxiety, abnormal bleeding/bruising, or enlarged lymph nodes.  Hemorrhoids periodically flare.  S/p banding.  Denies any bleeding. Low back pain per HPI.  No numbness, tingling, weakness.  Neck pain, mild and chronic (left-sided). Carpal tunnel syndrome is sporadic, on the left, mild/tolerable.  R improved, s/p surgery.  Hot flashes are tolerable with effexor, mainly in the morning after shower UPDATE Psoriasis is not currently flaring. Denies vision changes; was noted to have start of macular degeneration.   PHYSICAL EXAM:  There were no vitals taken for this visit.  Wt Readings from Last 3 Encounters:  08/01/22 170 lb (77.1 kg)  07/03/22 166 lb (75.3 kg)  05/31/22 169 lb 6 oz (76.8 kg)   General Appearance:    Alert, cooperative, no distress, appears stated age. She declined changing into a gown  Head:    Normocephalic, without obvious abnormality, atraumatic     Eyes:     PERRL, conjunctiva/corneas clear, EOM's intact, fundi benign     Ears:    Normal TM's and external ear canals     Nose:    No drainage or sinus tenderness  Throat:    Normal mucosa  Neck:    Supple, no lymphadenopathy; thyroid: no enlargement/ tenderness/nodules; no carotid bruit or JVD     Back:    Spine nontender, no curvature, ROM normal, no CVA tenderness.   Lungs:    Clear to auscultation bilaterally without wheezes, rales or ronchi; respirations unlabored     Chest Wall:    No tenderness or deformity.   Heart:    Regular rate and rhythm, S1 and S2 normal, no murmur, rub or gallop     Breast Exam:    Deferred to GYN     Abdomen:    Soft, nondistended, normoactive bowel sounds, no masses, no hepatosplenomegaly.   Genitalia:    Deferred to GYN     Extremities:    No clubbing, cyanosis or edema.  Pulses:    2+ and symmetric all extremities     Skin:    Normal turgor. Exam is limited due to not changing into gown.  Lymph nodes:    Cervical, supraclavicular nodes normal     Neurologic:    Normal strength, sensation and gait; reflexes 2+ and symmetric throughout                     Psych:    Normal mood, affect, hygiene and grooming  Normal CBC, Mckenzie-met in October reviewed, done by oncology.    ASSESSMENT/PLAN:  When did she last see GYN? Did she have pap, do we need to get records? Last note we got from [facility] was 04/2021.  ?did she get flu, COVID, shingrix, tetanus or RSV??? Enter/offer/decline All recommended, including prevnar-20 (but that can wait if needs others). Asked her to get Tdap and shingrix last year.  Should be off tamoxifen, and tapered off effexor?  TSH, lipids, A1c (with labs), fglu  REFILL THYROID MED AFTER LABS BACK Refill CRESTOR after labs--NEED TO CHANGE DIRECTIONS  HCTZ not due until March.   Discussed monthly self breast exams and yearly mammograms; at least 30 minutes of aerobic activity at least 5 days/week and weight-bearing exercise 2x/week;  proper sunscreen use reviewed; healthy diet, including goals of calcium and vitamin D intake and alcohol recommendations (less than or equal to 1 drink/day) reviewed; regular seatbelt use; changing batteries in smoke detectors.  Immunization recommendations discussed--continue high dose flu shots yearly.   COVID booster Tetanus booster is due (to get TdaP from pharmacy).  Shingrix is  recommended; risks/SE reviewed, to get from pharmacy.  [id number] RSV Colonoscopy recommendations reviewed, UTD   MOST form reviewed and updated. Full Code, Full care. Reminded to get the Living Will and healthcare POA notarized and to get [place] copies..    F/u 1 year, sooner prn or based on lab results.   Medicare Attestation I have personally reviewed: The patient's medical and social history Their use of alcohol, tobacco or illicit drugs Their current medications and supplements The patient's functional ability including ADLs,fall risks, home safety risks, cognitive, and hearing and visual impairment Diet and physical activities Evidence for depression or mood disorders  The patient's weight, height, BMI have been recorded in the chart.  I have made referrals, counseling, and provided education to the patient based on review of the above and I have provided the patient with a written personalized care plan for preventive services.

## 2022-09-15 NOTE — Patient Instructions (Incomplete)
HEALTH MAINTENANCE RECOMMENDATIONS:  It is recommended that you get at least 30 minutes of aerobic exercise at least 5 days/week (for weight loss, you may need as much as 60-90 minutes). This can be any activity that gets your heart rate up. This can be divided in 10-15 minute intervals if needed, but try and build up your endurance at least once a week.  Weight bearing exercise is also recommended twice weekly.  Eat a healthy diet with lots of vegetables, fruits and fiber.  "Colorful" foods have a lot of vitamins (ie green vegetables, tomatoes, red peppers, etc).  Limit sweet tea, regular sodas and alcoholic beverages, all of which has a lot of calories and sugar.  Up to 1 alcoholic drink daily may be beneficial for women (unless trying to lose weight, watch sugars).  Drink a lot of water.  Calcium recommendations are 1200-1500 mg daily (1500 mg for postmenopausal women or women without ovaries), and vitamin D 1000 IU daily.  This should be obtained from diet and/or supplements (vitamins), and calcium should not be taken all at once, but in divided doses.  Monthly self breast exams and yearly mammograms for women over the age of 26 is recommended.  Sunscreen of at least SPF 30 should be used on all sun-exposed parts of the skin when outside between the hours of 10 am and 4 pm (not just when at beach or pool, but even with exercise, golf, tennis, and yard work!)  Use a sunscreen that says "broad spectrum" so it covers both UVA and UVB rays, and make sure to reapply every 1-2 hours.  Remember to change the batteries in your smoke detectors when changing your clock times in the spring and fall. Carbon monoxide detectors are recommended for your home.  Use your seat belt every time you are in a car, and please drive safely and not be distracted with cell phones and texting while driving.   Mckenzie Webb , Thank you for taking time to come for your Medicare Wellness Visit. I appreciate your ongoing  commitment to your health goals. Please review the following plan we discussed and let me know if I can assist you in the future.   This is a list of the screening recommended for you and due dates:  Health Maintenance  Topic Date Due   Zoster (Shingles) Vaccine (1 of 2) Never done   DTaP/Tdap/Td vaccine (3 - Td or Tdap) 12/30/2020   Flu Shot  03/19/2022   COVID-19 Vaccine (4 - 2023-24 season) 04/19/2022   Medicare Annual Wellness Visit  09/12/2022   Mammogram  03/15/2023   Colon Cancer Screening  12/25/2027   Pneumonia Vaccine  Completed   DEXA scan (bone density measurement)  Completed   Hepatitis C Screening: USPSTF Recommendation to screen - Ages 27-79 yo.  Completed   HPV Vaccine  Aged Out   Please bring [place] copies of your Living Will and [place] once completed and notarized so that it can be scanned into your medical chart.  Tetanus booster (TdaP)--please get this from the pharmacy.  RSV--we discussed this vaccine.  It is a seasonal illness (winters only).  You need to wait 2 weeks from today's flu shot to get this from the pharmacy.  It should last for 2 seasons (studies are ongoing). It is your choice if you elect to wait until next year (to get in the Fall, October/November).   I recommend getting the new shingles vaccine (Shingrix). Since you have Medicare,  you will need to get this from the pharmacy, as it is covered by Part D. This is a series of 2 injections, spaced 2 months apart.   This should be separated from other vaccines by at least 2 weeks.  Try and get your flu shots earlier in the season (September/October).  Please cut back on your cookies with your coffee.  Prevnar-20 (pneumonia vaccine)   Since you are having daily heartburn, it is probably better to take something daily that is preventative, rather than reacting to your symptoms.  You can start with an over-the-counter dose of Pepcid (famotidine '20mg'$ ), just once daily in the morning.   If you have heartburn at night, you can take this twice daily.  If this isn't effective, you can go back to the prescription dose of '40mg'$ , vs do a 2 week course of omeprazole to calm things down. Continue to limit your spicy and acidic foods. Caffeine and chocolate are also triggers.

## 2022-09-16 ENCOUNTER — Ambulatory Visit (INDEPENDENT_AMBULATORY_CARE_PROVIDER_SITE_OTHER): Payer: Medicare HMO | Admitting: Family Medicine

## 2022-09-16 ENCOUNTER — Encounter: Payer: Self-pay | Admitting: Family Medicine

## 2022-09-16 VITALS — BP 120/64 | HR 68 | Ht 63.0 in | Wt 167.0 lb

## 2022-09-16 DIAGNOSIS — Z23 Encounter for immunization: Secondary | ICD-10-CM

## 2022-09-16 DIAGNOSIS — L409 Psoriasis, unspecified: Secondary | ICD-10-CM

## 2022-09-16 DIAGNOSIS — K219 Gastro-esophageal reflux disease without esophagitis: Secondary | ICD-10-CM | POA: Diagnosis not present

## 2022-09-16 DIAGNOSIS — E782 Mixed hyperlipidemia: Secondary | ICD-10-CM | POA: Diagnosis not present

## 2022-09-16 DIAGNOSIS — E7800 Pure hypercholesterolemia, unspecified: Secondary | ICD-10-CM

## 2022-09-16 DIAGNOSIS — Z853 Personal history of malignant neoplasm of breast: Secondary | ICD-10-CM | POA: Diagnosis not present

## 2022-09-16 DIAGNOSIS — E039 Hypothyroidism, unspecified: Secondary | ICD-10-CM

## 2022-09-16 DIAGNOSIS — Z5181 Encounter for therapeutic drug level monitoring: Secondary | ICD-10-CM | POA: Diagnosis not present

## 2022-09-16 DIAGNOSIS — I10 Essential (primary) hypertension: Secondary | ICD-10-CM | POA: Diagnosis not present

## 2022-09-16 DIAGNOSIS — Z0000 Encounter for general adult medical examination without abnormal findings: Secondary | ICD-10-CM

## 2022-09-16 DIAGNOSIS — Z6829 Body mass index (BMI) 29.0-29.9, adult: Secondary | ICD-10-CM

## 2022-09-16 DIAGNOSIS — R7301 Impaired fasting glucose: Secondary | ICD-10-CM

## 2022-09-17 ENCOUNTER — Encounter: Payer: Self-pay | Admitting: Family Medicine

## 2022-09-17 DIAGNOSIS — E782 Mixed hyperlipidemia: Secondary | ICD-10-CM | POA: Insufficient documentation

## 2022-09-17 LAB — GLUCOSE, RANDOM: Glucose: 98 mg/dL (ref 70–99)

## 2022-09-17 LAB — HEMOGLOBIN A1C
Est. average glucose Bld gHb Est-mCnc: 117 mg/dL
Hgb A1c MFr Bld: 5.7 % — ABNORMAL HIGH (ref 4.8–5.6)

## 2022-09-17 LAB — LIPID PANEL
Chol/HDL Ratio: 4.2 ratio (ref 0.0–4.4)
Cholesterol, Total: 223 mg/dL — ABNORMAL HIGH (ref 100–199)
HDL: 53 mg/dL (ref >39)
LDL Chol Calc (NIH): 128 mg/dL — ABNORMAL HIGH (ref 0–99)
Triglycerides: 240 mg/dL — ABNORMAL HIGH (ref 0–149)
VLDL Cholesterol Cal: 42 mg/dL — ABNORMAL HIGH (ref 5–40)

## 2022-09-17 LAB — TSH: TSH: 3.59 u[IU]/mL (ref 0.450–4.500)

## 2022-09-17 MED ORDER — LEVOTHYROXINE SODIUM 88 MCG PO TABS: 88.0000 ug | ORAL_TABLET | Freq: Every day | ORAL | 3 refills | Status: DC

## 2022-09-17 MED ORDER — ROSUVASTATIN CALCIUM 10 MG PO TABS: ORAL_TABLET | ORAL | 1 refills | Status: DC

## 2022-09-17 NOTE — Progress Notes (Signed)
Pt needs to be scheduled for a 6 month fasting med check, as her cholesterol was significantly higher.  She should check her MyChart for recommendations (she saw the results, hasn't yet seen my comments--suggested fish oil in addition to improving diet)

## 2022-10-22 DIAGNOSIS — L400 Psoriasis vulgaris: Secondary | ICD-10-CM | POA: Diagnosis not present

## 2022-10-24 ENCOUNTER — Other Ambulatory Visit: Payer: Self-pay | Admitting: Family Medicine

## 2022-10-24 ENCOUNTER — Encounter: Payer: Self-pay | Admitting: Family Medicine

## 2022-10-24 ENCOUNTER — Other Ambulatory Visit: Payer: Self-pay | Admitting: *Deleted

## 2022-10-24 DIAGNOSIS — I10 Essential (primary) hypertension: Secondary | ICD-10-CM

## 2022-10-24 MED ORDER — HYDROCHLOROTHIAZIDE 12.5 MG PO CAPS: 12.5000 mg | ORAL_CAPSULE | Freq: Every day | ORAL | 0 refills | Status: DC

## 2022-10-30 ENCOUNTER — Ambulatory Visit
Admission: EM | Admit: 2022-10-30 | Discharge: 2022-10-30 | Disposition: A | Discharge status: Home / Self Care | Payer: Medicare HMO | Attending: Nurse Practitioner | Admitting: Nurse Practitioner

## 2022-10-30 ENCOUNTER — Encounter: Payer: Self-pay | Admitting: Emergency Medicine

## 2022-10-30 DIAGNOSIS — R0981 Nasal congestion: Secondary | ICD-10-CM

## 2022-10-30 MED ORDER — AZITHROMYCIN 250 MG PO TABS: 250.0000 mg | ORAL_TABLET | Freq: Every day | ORAL | 0 refills | Status: DC

## 2022-10-30 MED ORDER — FLUTICASONE PROPIONATE 50 MCG/ACT NA SUSP: 2.0000 | Freq: Every day | NASAL | 2 refills | Status: DC

## 2022-10-30 NOTE — ED Triage Notes (Signed)
Nasal congestion and facial pressure since Sunday.  Cough started yesterday.  Has been taking mucinex, allergy medication, and tylenol.

## 2022-10-30 NOTE — ED Provider Notes (Signed)
[facility]    CSN: [id number] Arrival date & time: 10/30/22  1315      History   Chief Complaint No chief complaint on file.   HPI Mckenzie Webb is a 77 y.o. female.   HPI  She is in today for facial pain, cough, and nasal congestion since Sunday. She has been taking Mucinex, allergy medication and Tylenol.  She reports exposure to URI by family members. Denies fever, chills,  shortness of breath, dyspnea on exertion, chest pain, nausea, vomiting, constipation, diarrhea, or any edema.   Past Medical History:  Diagnosis Date   Arthritis    lower back   Cough 07/22/2017   Dental crowns present    Family history of adverse reaction to anesthesia    pt's sister has hx. of post-op N/V   GERD (gastroesophageal reflux disease)    History of chemotherapy    finished chemo 06/30/2017   History of radiation therapy 07/29/17- 08/28/17   Right Breast and Axilla treated to 42.56 Gy with 16 fx of 2.66. Boost of 8 Gy with 4 fx of 2 Gy.    History of right breast cancer 02/2017   Hyperlipidemia    Hypothyroid    Leukopenia 07/07/2017   Personal history of chemotherapy    Personal history of radiation therapy    Stuffy and runny nose 07/22/2017   clear drainage from nose, per pt.    Patient Active Problem List   Diagnosis Date Noted   Mixed hyperlipidemia 09/17/2022   Senile purpura ([place]) 03/10/2020   Plant dermatitis 12/13/2019   Hot flashes due to tamoxifen 09/06/2019   Morbid exogenous obesity ([place]) 09/28/2017   Port catheter in place 04/28/2017   Genetic testing 04/17/2017   Family history of pancreatic cancer    Family history of colon cancer    Malignant neoplasm of upper-inner quadrant of right breast in female, estrogen receptor positive ([place]) 03/11/2017   Essential hypertension, benign 07/31/2015   IFG (impaired fasting glucose) 05/03/2015   Psoriasis 05/03/2014   Greater trochanteric bursitis of right hip 03/14/2014   Obesity (BMI 30-39.9)  05/26/2013   GERD (gastroesophageal reflux disease) 12/31/2010   Hypothyroidism 12/31/2010   Vitamin D deficiency 12/31/2010   Pure hypercholesterolemia 12/31/2010    Past Surgical History:  Procedure Laterality Date   BREAST LUMPECTOMY Right 03/26/2017   BREAST LUMPECTOMY WITH RADIOACTIVE SEED AND SENTINEL LYMPH NODE BIOPSY Right 03/26/2017   Procedure: BREAST LUMPECTOMY WITH RADIOACTIVE SEED AND SENTINEL LYMPH NODE BIOPSY;  Surgeon: Jovita Kussmaul, MD;  Location: [place];  Service: General;  Laterality: Right;   CARPAL TUNNEL RELEASE Right 01/05/2021   R CTR with RMF trigger release (Dr. Amedeo Plenty)   CATARACT EXTRACTION Left 2021   CHOLECYSTECTOMY     COLONOSCOPY  10/2007   CYST EXCISION  02/01/2003   knee   KNEE ARTHROSCOPY Right 1990   KNEE ARTHROSCOPY Left 12/12/2010   PORT-A-CATH REMOVAL N/A 09/03/2017   Procedure: REMOVAL PORT-A-CATH;  Surgeon: Jovita Kussmaul, MD;  Location: [facility];  Service: General;  Laterality: N/A;   PORTACATH PLACEMENT Left 04/24/2017   Procedure: INSERTION PORT-A-CATH;  Surgeon: Jovita Kussmaul, MD;  Location: [facility];  Service: General;  Laterality: Left;   [place]  09/2009 R, 06/2010 L    OB History     Gravida  2   Para  2   Term      Preterm  AB      Living  2      SAB      IAB      Ectopic      Multiple      Live Births               Home Medications    Prior to Admission medications   Medication Sig Start Date End Date Taking? Authorizing Provider  azithromycin (ZITHROMAX) 250 MG tablet Take 1 tablet (250 mg total) by mouth daily. Take first 2 tablets together, then 1 every day until finished. 10/30/22  Yes Myalynn Lingle, Diona Foley, NP  fluticasone (FLONASE) 50 MCG/ACT nasal spray Place 2 sprays into both nostrils daily. 10/30/22  Yes Vevelyn Francois, NP  acetaminophen (TYLENOL) 500 MG tablet Take 1,000 mg by mouth every 6 (six) hours as needed. Patient  not taking: Reported on 09/16/2022    [provider]  calcium carbonate (TUMS - DOSED IN MG ELEMENTAL CALCIUM) 500 MG chewable tablet Chew 1 tablet by mouth daily.    [provider]  calcium-vitamin D (OSCAL WITH D) 500-200 MG-UNIT tablet Take 1 tablet by mouth daily.    [provider]  clobetasol cream (TEMOVATE) 0.05 % Apply topically 2 (two) times daily as needed. Patient not taking: Reported on 09/16/2022 04/27/20   Rita Ohara, MD  Coenzyme Q10 (CO Q-10) 100 MG CAPS Take by mouth daily.    [provider]  hydrochlorothiazide (MICROZIDE) 12.5 MG capsule Take 1 capsule (12.5 mg total) by mouth daily. 10/24/22   Rita Ohara, MD  levothyroxine (SYNTHROID) 88 MCG tablet Take 1 tablet (88 mcg total) by mouth daily. 09/17/22   Rita Ohara, MD  loratadine (CLARITIN) 10 MG tablet Take 10 mg by mouth daily as needed for allergies.  Patient not taking: Reported on 08/01/2022    [provider]  Multiple Vitamins-Minerals (ALIVE WOMENS 50+ PO) Take 1 tablet by mouth daily.     [provider]  Multiple Vitamins-Minerals (PRESERVISION AREDS 2 PO) Take 1 tablet by mouth in the morning and at bedtime.    [provider]  Probiotic Product (ALIGN PO) Take 1 capsule by mouth daily.    [provider]  rosuvastatin (CRESTOR) 10 MG tablet Take 1 tablet by mouth every other day 09/17/22   Rita Ohara, MD    Family History Family History  Problem Relation Age of Onset   Hypertension Mother    Heart disease Mother    Gallbladder disease Mother    Thyroid disease Mother    Arthritis Mother    Diabetes Father    Cancer Father        pancreatic   Hypertension Sister    Gallbladder disease Sister    Diabetes Sister    Diverticulitis Sister        of small intestine   COPD Sister    Stroke Sister        late 50's   Hypertension Sister    Pulmonary embolism Sister    Ulcerative colitis Sister    COPD Brother        had lung lobe removed  for suspected cancer, but was benign; smoker   AAA (abdominal aortic aneurysm) Brother    Emphysema Brother    Diabetes Son        great toes amputated at 9   Cancer Son        testicular cancer   Lung cancer Cousin  maternal first cousin - smoker   Colon cancer Other 52    Social History Social History   Tobacco Use   Smoking status: Never   Smokeless tobacco: Never  Vaping Use   Vaping Use: Never used  Substance Use Topics   Alcohol use: Not Currently    Comment: none since her cancer diagnosis 2018; rare margarita   Drug use: No     Allergies   Adhesive [tape], Ciprofloxacin, Codeine, Iodine, Polysporin [bacitracin-polymyxin b], Penicillins, and Sulfa antibiotics   Review of Systems Review of Systems   Physical Exam Triage Vital Signs ED Triage Vitals  Enc Vitals Group     BP 10/30/22 1324 (!) 154/87     Pulse Rate 10/30/22 1324 74     Resp 10/30/22 1324 18     Temp 10/30/22 1324 97.9 F (36.6 C)     Temp Source 10/30/22 1324 Oral     SpO2 10/30/22 1324 96 %     Weight --      Height --      Head Circumference --      Peak Flow --      Pain Score 10/30/22 1325 8     Pain Loc --      Pain Edu? --      Excl. in [place]? --    No data found.  Updated Vital Signs BP (!) 154/87 (BP Location: Left Arm)   Pulse 74   Temp 97.9 F (36.6 C) (Oral)   Resp 18   SpO2 96%   Visual Acuity Right Eye Distance:   Left Eye Distance:   Bilateral Distance:    Right Eye Near:   Left Eye Near:    Bilateral Near:     Physical Exam Constitutional:      General: She is not in acute distress. HENT:     Head: Normocephalic and atraumatic.     Right Ear: Tympanic membrane normal.     Left Ear: Tympanic membrane normal.     Nose: No congestion or rhinorrhea.     Mouth/Throat:     Mouth: Mucous membranes are moist.     Pharynx: No oropharyngeal exudate or posterior oropharyngeal erythema.  Eyes:     Pupils: Pupils are equal, round, and reactive to light.   Cardiovascular:     Rate and Rhythm: Normal rate.     Pulses: Normal pulses.  Pulmonary:     Effort: Pulmonary effort is normal.  Musculoskeletal:        General: Normal range of motion.     Cervical back: Normal range of motion.  Neurological:     Mental Status: She is alert.      UC Treatments / Results  Labs (all labs ordered are listed, but only abnormal results are displayed) Labs Reviewed - No data to display  EKG   Radiology No results found.  Procedures Procedures (including critical care time)  Medications Ordered in UC Medications - No data to display  Initial Impression / Assessment and Plan / UC Course  I have reviewed the triage vital signs and the nursing notes.  Pertinent labs & imaging results that were available during my care of the patient were reviewed by me and considered in my medical decision making (see chart for details).     Nasal congestion Final Clinical Impressions(s) / UC Diagnoses   Final diagnoses:  None     Discharge Instructions      You may have an Sinusitis.  Zpac  as directed and Fluticasone 50 mcg  Continue the Mucinex and hydrate well with water.  We encourage conservative treatment with symptom relief. We encourage you to use Tylenol alternating with Ibuprofen for your fever if not contraindicated. (Remember to use as directed do not exceed daily dosing recommendations) We also encourage salt water gargles for your sore throat. You should also consider throat lozenges and chloraseptic spray.  Your cough can be soothed with a cough suppressant.         ED Prescriptions     Medication Sig Dispense Auth. Provider   azithromycin (ZITHROMAX) 250 MG tablet Take 1 tablet (250 mg total) by mouth daily. Take first 2 tablets together, then 1 every day until finished. 6 tablet Dionisio David M, NP   fluticasone (FLONASE) 50 MCG/ACT nasal spray Place 2 sprays into both nostrils daily. 10 mL Vevelyn Francois, NP      PDMP  not reviewed this encounter.   Dionisio David [place], NP 10/30/22 1409

## 2022-10-30 NOTE — Discharge Instructions (Addendum)
You may have an Sinusitis.  Zpac as directed and Fluticasone 50 mcg  Continue the Mucinex and hydrate well with water.  We encourage conservative treatment with symptom relief. We encourage you to use Tylenol alternating with Ibuprofen for your fever if not contraindicated. (Remember to use as directed do not exceed daily dosing recommendations) We also encourage salt water gargles for your sore throat. You should also consider throat lozenges and chloraseptic spray.  Your cough can be soothed with a cough suppressant.

## 2023-01-08 ENCOUNTER — Inpatient Hospital Stay: Payer: Medicare HMO | Attending: Physician Assistant | Admitting: Physician Assistant

## 2023-01-08 ENCOUNTER — Telehealth: Payer: Self-pay | Admitting: *Deleted

## 2023-01-08 ENCOUNTER — Other Ambulatory Visit: Payer: Self-pay

## 2023-01-08 VITALS — BP 143/84 | HR 88 | Temp 99.3°F | Resp 18 | Ht 63.0 in | Wt 170.3 lb

## 2023-01-08 DIAGNOSIS — Z170 Estrogen receptor positive status [ER+]: Secondary | ICD-10-CM

## 2023-01-08 DIAGNOSIS — C50211 Malignant neoplasm of upper-inner quadrant of right female breast: Secondary | ICD-10-CM

## 2023-01-08 DIAGNOSIS — L539 Erythematous condition, unspecified: Secondary | ICD-10-CM | POA: Diagnosis not present

## 2023-01-08 DIAGNOSIS — Z853 Personal history of malignant neoplasm of breast: Secondary | ICD-10-CM | POA: Insufficient documentation

## 2023-01-08 DIAGNOSIS — Z801 Family history of malignant neoplasm of trachea, bronchus and lung: Secondary | ICD-10-CM | POA: Diagnosis not present

## 2023-01-08 DIAGNOSIS — Z800 Family history of malignant neoplasm of digestive organs: Secondary | ICD-10-CM | POA: Insufficient documentation

## 2023-01-08 MED ORDER — DOXYCYCLINE HYCLATE 100 MG PO TABS: 100.0000 mg | ORAL_TABLET | Freq: Two times a day (BID) | ORAL | 0 refills | Status: AC

## 2023-01-08 NOTE — Progress Notes (Unsigned)
Symptom Management Consult Note [facility]    Patient Care Team: Joselyn Arrow, MD as PCP - General (Family Medicine) Griselda Miner, MD as Consulting Physician (General Surgery) Magrinat, Valentino Hue, MD (Inactive) as Consulting Physician (Oncology) Lonie Peak, MD as Attending Physician (Radiation Oncology) Charna Elizabeth, MD as Consulting Physician (Gastroenterology) Annamaria Helling, MD as Consulting Physician (Obstetrics and Gynecology) Salvatore Marvel, MD as Consulting Physician (Orthopedic Surgery) [place], Amy, MD as Consulting Physician (Dermatology) Axel Filler, Larna Daughters, NP as Nurse Practitioner (Hematology and Oncology) Annamaria Helling, MD as Consulting Physician (Obstetrics and Gynecology) Daisy Lazar, DO (Optometry) Sallye Lat, MD as Consulting Physician (Ophthalmology) Ihor Gully, MD (Inactive) as Referring Physician (Urology) Renaldo Fiddler, MD as Consulting Physician (Pain Medicine) Dr. Debby Bud, DDS as Consulting Physician (Dentistry)    Name / MRN / DOB: Mckenzie Webb  [id number]  [birth year]   Date of visit: 01/08/2023   Chief Complaint/Reason for visit: breast redness   Current Therapy: Stopped Tamoxifen in 05/2022     ASSESSMENT & PLAN: Patient is a 77 y.o. female  with oncologic history of estrogen receptor positive breast cancer followed by Dr. Al Pimple.  I have viewed most recent oncology note and lab work.    #Estrogen receptor positive breast cancer  - Last saw oncologist 05/31/22. She finished tamoxifen at that time. -Last mammogram was 02/2022 with no mammographic evidence of malignancy  #Right breast erythema -Clinically concerned for cellulitis. -With reported discharge and fullness palpated I will order [place] and mammogram to evaluate for possible seroma vs abscess. -Patient has multiple allergies to antibiotics therefore oxycycline BID x 10 days prescribed. -Strict ED precautions discussed should symptoms  worsen.       Heme/Onc History: Oncology History  Malignant neoplasm of upper-inner quadrant of right breast in female, estrogen receptor positive (HCC)  03/03/2017 Initial Biopsy   Right breast upper inner quadrant biopsy: IDC, grade 1, ER+, PR negative, Ki-67 3%, HER-2 negative.     03/12/2017 -  Anti-estrogen oral therapy   Anastrozole started neoadjuvantly and held during chemo, restarted in 06/2017   03/26/2017 Surgery   Right breast lumpectomy: IDC, grade 1, 1.2 cm, 1 SLN with micrometastases, 1 negative,  [id number]  mammaprint high risk indicating a need for chemo   04/12/2017 Genetic Testing   Negative genetic testing on the common hereditary cancer panel.  The Hereditary Gene Panel offered by [facility] includes sequencing and/or deletion duplication testing of the following 46 genes: APC, ATM, AXIN2, BARD1, BMPR1A, BRCA1, BRCA2, BRIP1, CDH1, CDKN2A (p14ARF), CDKN2A (p16INK4a), CHEK2, CTNNA1, DICER1, EPCAM (Deletion/duplication testing only), GREM1 (promoter region deletion/duplication testing only), KIT, MEN1, MLH1, MSH2, MSH3, MSH6, MUTYH, NBN, NF1, NHTL1, PALB2, PDGFRA, PMS2, POLD1, POLE, PTEN, RAD50, RAD51C, RAD51D, SDHB, SDHC, SDHD, SMAD4, SMARCA4. STK11, TP53, TSC1, TSC2, and VHL.  The following genes were evaluated for sequence changes only: SDHA and HOXB13 c.251G>A variant only.  The report date is April 12, 2017.    04/28/2017 - 06/30/2017 Adjuvant Chemotherapy   Docetaxel/cyclophosphamide x 1 cycle, couldn't tolerate, then went on to receive Doxorubicin and Cyclophosphamide x 3 cycles (05/19/17-06/30/17)   07/29/2017 - 08/28/2017 Radiation Therapy   Adjuvant radiation with Dr. Basilio Cairo: 1) Right breast and axilla treated to 42.56 Gy with 16 fx of 2.66.  2)  boost of 8 Gy with 4 fx of 2 Gy         Interval history-: Mckenzie Webb is a 77 y.o. female with oncologic history as above presenting to [facility]  today with chief complaint of right breast redness x 1 day.  Patient is  reports noticing a small area of redness on her right breast after taking off her bra last night. She thought it was from irritation from her bra. When she woke up this morning she noticed her entire right breast was red, warm, and painful to the touch. She admits to chills, no fevers. She also feels fatigued which is new. She states that approximately 1 month ago she had a small amount of dry discharge in her right bra. She did not think anything of it until now. She denies any nipple discharge now. No OTC medications taken prior to arrival.     ROS  All other systems are reviewed and are negative for acute change except as noted in the HPI.    Allergies  Allergen Reactions   Adhesive [Tape] Other (See Comments)    SKIN TEARS   Ciprofloxacin Nausea Only   Codeine Nausea Only   Iodine Swelling   Polysporin [Bacitracin-Polymyxin B] Swelling    OPHTHALMIC - SWELLING OF EYES   Penicillins Rash        Sulfa Antibiotics Itching     Past Medical History:  Diagnosis Date   Arthritis    lower back   Cough 07/22/2017   Dental crowns present    Family history of adverse reaction to anesthesia    pt's sister has hx. of post-op N/V   GERD (gastroesophageal reflux disease)    History of chemotherapy    finished chemo 06/30/2017   History of radiation therapy 07/29/17- 08/28/17   Right Breast and Axilla treated to 42.56 Gy with 16 fx of 2.66. Boost of 8 Gy with 4 fx of 2 Gy.    History of right breast cancer 02/2017   Hyperlipidemia    Hypothyroid    Leukopenia 07/07/2017   Personal history of chemotherapy    Personal history of radiation therapy    Stuffy and runny nose 07/22/2017   clear drainage from nose, per pt.     Past Surgical History:  Procedure Laterality Date   BREAST LUMPECTOMY Right 03/26/2017   BREAST LUMPECTOMY WITH RADIOACTIVE SEED AND SENTINEL LYMPH NODE BIOPSY Right 03/26/2017   Procedure: BREAST LUMPECTOMY WITH RADIOACTIVE SEED AND SENTINEL LYMPH NODE BIOPSY;   Surgeon: Griselda Miner, MD;  Location: [facility];  Service: General;  Laterality: Right;   CARPAL TUNNEL RELEASE Right 01/05/2021   R CTR with RMF trigger release (Dr. Amanda Pea)   CATARACT EXTRACTION Left 2021   CHOLECYSTECTOMY     COLONOSCOPY  10/2007   CYST EXCISION  02/01/2003   knee   KNEE ARTHROSCOPY Right 1990   KNEE ARTHROSCOPY Left 12/12/2010   PORT-A-CATH REMOVAL N/A 09/03/2017   Procedure: REMOVAL PORT-A-CATH;  Surgeon: Griselda Miner, MD;  Location: [facility];  Service: General;  Laterality: N/A;   PORTACATH PLACEMENT Left 04/24/2017   Procedure: INSERTION PORT-A-CATH;  Surgeon: Griselda Miner, MD;  Location: [facility];  Service: General;  Laterality: Left;   TUBAL LIGATION  1983   VARICOSE VEIN SURGERY  09/2009 R, 06/2010 L    Social History   Socioeconomic History   Marital status: Married    Spouse name: Not on file   Number of children: 2   Years of education: Not on file   Highest education level: Not on file  Occupational History   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  Children in [place] and [place].  1 granddaughter (in [place]).    Retired 05/2014.      Updated 08/2022   Social Determinants of Health   Financial Resource Strain: Not on file  Food Insecurity: Not on file  Transportation Needs: Not on file  Physical Activity: Not on file  Stress: Not on file  Social Connections: Not on file  Intimate Partner Violence: Not on file    Family History  Problem Relation Age of Onset   Hypertension  Mother    Heart disease Mother    Gallbladder disease Mother    Thyroid disease Mother    Arthritis Mother    Diabetes Father    Cancer Father        pancreatic   Hypertension Sister    Gallbladder disease Sister    Diabetes Sister    Diverticulitis Sister        of small intestine   COPD Sister    Stroke Sister        late 50's   Hypertension Sister    Pulmonary embolism Sister    Ulcerative colitis Sister    COPD Brother        had lung lobe removed for suspected cancer, but was benign; smoker   AAA (abdominal aortic aneurysm) Brother    Emphysema Brother    Diabetes Son        great toes amputated at 32   Cancer Son        testicular cancer   Lung cancer Cousin        maternal first cousin - smoker   Colon cancer Other 52     Current Outpatient Medications:    doxycycline (VIBRA-TABS) 100 MG tablet, Take 1 tablet (100 mg total) by mouth 2 (two) times daily for 10 days., Disp: 20 tablet, Rfl: 0   acetaminophen (TYLENOL) 500 MG tablet, Take 1,000 mg by mouth every 6 (six) hours as needed. (Patient not taking: Reported on 09/16/2022), Disp: , Rfl:    azithromycin (ZITHROMAX) 250 MG tablet, Take 1 tablet (250 mg total) by mouth daily. Take first 2 tablets together, then 1 every day until finished., Disp: 6 tablet, Rfl: 0   calcium carbonate (TUMS - DOSED IN MG ELEMENTAL CALCIUM) 500 MG chewable tablet, Chew 1 tablet by mouth daily., Disp: , Rfl:    calcium-vitamin D (OSCAL WITH D) 500-200 MG-UNIT tablet, Take 1 tablet by mouth daily., Disp: , Rfl:    clobetasol cream (TEMOVATE) 0.05 %, Apply topically 2 (two) times daily as needed. (Patient not taking: Reported on 09/16/2022), Disp: 60 g, Rfl: 0   Coenzyme Q10 (CO Q-10) 100 MG CAPS, Take by mouth daily., Disp: , Rfl:    fluticasone (FLONASE) 50 MCG/ACT nasal spray, Place 2 sprays into both nostrils daily., Disp: 10 mL, Rfl: 2   hydrochlorothiazide (MICROZIDE) 12.5 MG capsule, Take 1 capsule (12.5 mg total) by mouth daily.,  Disp: 90 capsule, Rfl: 0   levothyroxine (SYNTHROID) 88 MCG tablet, Take 1 tablet (88 mcg total) by  mouth daily., Disp: 90 tablet, Rfl: 3   loratadine (CLARITIN) 10 MG tablet, Take 10 mg by mouth daily as needed for allergies.  (Patient not taking: Reported on 08/01/2022), Disp: , Rfl:    Multiple Vitamins-Minerals (ALIVE WOMENS 50+ PO), Take 1 tablet by mouth daily. , Disp: , Rfl:    Multiple Vitamins-Minerals (PRESERVISION AREDS 2 PO), Take 1 tablet by mouth in the morning and at bedtime., Disp: , Rfl:    Probiotic Product (ALIGN PO), Take 1 capsule by mouth daily., Disp: , Rfl:    rosuvastatin (CRESTOR) 10 MG tablet, Take 1 tablet by mouth every other day, Disp: 45 tablet, Rfl: 1  PHYSICAL EXAM: ECOG FS:1 - Symptomatic but completely ambulatory    Vitals:   01/08/23 1450 01/08/23 1451  BP: (!) 156/86 (!) 143/84  Pulse: 88   Resp: 18   Temp: 99.3 F (37.4 C)   TempSrc: Oral   SpO2: 100%   Weight: 170 lb 4.8 oz (77.2 kg)   Height: 5\' 3"  (1.6 m)    Physical Exam Vitals and nursing note reviewed.  Constitutional:      Appearance: She is not ill-appearing or toxic-appearing.  HENT:     Head: Normocephalic.  Eyes:     Conjunctiva/sclera: Conjunctivae normal.  Cardiovascular:     Rate and Rhythm: Normal rate and regular rhythm.     Pulses: Normal pulses.     Heart sounds: Normal heart sounds.  Pulmonary:     Effort: Pulmonary effort is normal.     Breath sounds: Normal breath sounds.  Chest:     Comments: Right breast with erythema, warmth. Tender to palpation. No palpable abscess. Fullness directly above nipple. No nipple discharge. Please see media below. Abdominal:     General: There is no distension.  Musculoskeletal:     Cervical back: Normal range of motion.  Skin:    General: Skin is warm and dry.  Neurological:     Mental Status: She is alert.        LABORATORY DATA: I have reviewed the data as listed    Latest Ref Rng & Units 05/31/2022   12:58 PM  05/07/2021    2:37 PM 05/04/2020    2:30 PM  CBC  WBC 4.0 - 10.5 K/uL 4.7  4.2  4.4   Hemoglobin 12.0 - 15.0 g/dL 82.9  56.2  13.0   Hematocrit 36.0 - 46.0 % 36.8  36.0  37.7   Platelets 150 - 400 K/uL 150  137  137         Latest Ref Rng & Units 09/16/2022   11:07 AM 05/31/2022   12:58 PM 09/12/2021    9:37 AM  CMP  Glucose 70 - 99 mg/dL 98  94  94   BUN 8 - 23 mg/dL  15    Creatinine 8.65 - 1.00 mg/dL  7.84    Sodium 696 - 295 mmol/L  140    Potassium 3.5 - 5.1 mmol/L  4.3    Chloride 98 - 111 mmol/L  106    CO2 22 - 32 mmol/L  31    Calcium 8.9 - 10.3 mg/dL  9.2    Total Protein 6.5 - 8.1 g/dL  6.9    Total Bilirubin 0.3 - 1.2 mg/dL  0.4    Alkaline Phos 38 - 126 U/L  49    AST 15 - 41 U/L  16    ALT 0 - 44 U/L  12  RADIOGRAPHIC STUDIES (from last 24 hours if applicable) I have personally reviewed the radiological images as listed and agreed with the findings in the report. No results found.      Visit Diagnosis: 1. Malignant neoplasm of upper-inner quadrant of right breast in female, estrogen receptor positive (HCC)      Orders Placed This Encounter  Procedures   MM 3D DIAGNOSTIC MAMMOGRAM UNILATERAL RIGHT BREAST    Standing Status:   Future    Standing Expiration Date:   01/08/2024    Order Specific Question:   Reason for Exam (SYMPTOM  OR DIAGNOSIS REQUIRED)    Answer:   right breast redness and pain, hx breast cancer and lumpectomy    Order Specific Question:   Preferred imaging location?    [facility]   [place] LIMITED ULTRASOUND INCLUDING AXILLA RIGHT BREAST    Standing Status:   Future    Standing Expiration Date:   01/08/2024    Order Specific Question:   Reason for Exam (SYMPTOM  OR DIAGNOSIS REQUIRED)    Answer:   right breast redness and swelling, hx breast cancer    Order Specific Question:   Preferred imaging location?    Answer:   [facility]   [place] RT BREAST BX W LOC DEV 1ST LESION IMG BX SPEC US GUIDE    Standing  Status:   Future    Standing Expiration Date:   01/08/2024    Order Specific Question:   Reason for Exam (SYMPTOM  OR DIAGNOSIS REQUIRED)    Answer:   right breast redness and pain, hx of breast cancer and lumpectomy    Order Specific Question:   Preferred location?    Answer:   [facility]    All questions were answered. The patient knows to call the clinic with any problems, questions or concerns. No barriers to learning was detected.  A total of more than *** minutes were spent on this encounter with face-to-face time and non-face-to-face time, including preparing to see the patient, ordering tests and/or medications, counseling the patient and coordination of care as outlined above.    Thank you for allowing me to participate in the care of this patient.    Shanon Ace, PA-C Department of Hematology/Oncology [facility] Phone: [phone]  [phone]    01/08/2023 9:51 PM

## 2023-01-08 NOTE — Telephone Encounter (Signed)
This RN spoke with pt per her VM stating onset PM last night of Right breast redness,swelling and tenderness- she denies any fevers.  Per above appt made for [facility] for evaluation of possible cellulitis

## 2023-01-09 ENCOUNTER — Other Ambulatory Visit: Payer: Medicare HMO

## 2023-01-10 ENCOUNTER — Ambulatory Visit
Admission: RE | Admit: 2023-01-10 | Discharge: 2023-01-10 | Disposition: A | Discharge status: Home / Self Care | Payer: Medicare HMO | Source: Ambulatory Visit | Attending: Physician Assistant | Admitting: Physician Assistant

## 2023-01-10 ENCOUNTER — Telehealth: Payer: Self-pay

## 2023-01-10 DIAGNOSIS — C50211 Malignant neoplasm of upper-inner quadrant of right female breast: Secondary | ICD-10-CM

## 2023-01-10 DIAGNOSIS — L539 Erythematous condition, unspecified: Secondary | ICD-10-CM | POA: Diagnosis not present

## 2023-01-10 DIAGNOSIS — R92321 Mammographic fibroglandular density, right breast: Secondary | ICD-10-CM | POA: Diagnosis not present

## 2023-01-10 NOTE — Telephone Encounter (Signed)
This RN called and spoke to patient to review mammogram/ultrasound results per request of Namon Cirri, PA-C. Patient verbalized understanding of results and appointment was made for Tuesday, 5/28 at 1230 to reevaluate patient's breast to see effectiveness of antibiotics. Patient verbalized understanding of date, time and location of upcoming appointment. No further questions or concerns at this time.

## 2023-01-14 ENCOUNTER — Inpatient Hospital Stay (HOSPITAL_BASED_OUTPATIENT_CLINIC_OR_DEPARTMENT_OTHER): Payer: Medicare HMO | Admitting: Physician Assistant

## 2023-01-14 VITALS — BP 152/76 | HR 60 | Temp 97.6°F | Resp 16 | Wt 170.9 lb

## 2023-01-14 DIAGNOSIS — Z170 Estrogen receptor positive status [ER+]: Secondary | ICD-10-CM

## 2023-01-14 DIAGNOSIS — L539 Erythematous condition, unspecified: Secondary | ICD-10-CM

## 2023-01-14 DIAGNOSIS — Z800 Family history of malignant neoplasm of digestive organs: Secondary | ICD-10-CM | POA: Diagnosis not present

## 2023-01-14 DIAGNOSIS — C50211 Malignant neoplasm of upper-inner quadrant of right female breast: Secondary | ICD-10-CM | POA: Diagnosis not present

## 2023-01-14 DIAGNOSIS — Z853 Personal history of malignant neoplasm of breast: Secondary | ICD-10-CM | POA: Diagnosis not present

## 2023-01-14 DIAGNOSIS — Z801 Family history of malignant neoplasm of trachea, bronchus and lung: Secondary | ICD-10-CM | POA: Diagnosis not present

## 2023-01-14 MED ORDER — CLINDAMYCIN HCL 300 MG PO CAPS: 300.0000 mg | ORAL_CAPSULE | Freq: Three times a day (TID) | ORAL | 0 refills | Status: DC

## 2023-01-14 NOTE — Progress Notes (Signed)
Symptom Management Consult Note [facility]    Patient Care Team: Joselyn Arrow, MD as PCP - General (Family Medicine) Griselda Miner, MD as Consulting Physician (General Surgery) Magrinat, Valentino Hue, MD (Inactive) as Consulting Physician (Oncology) Lonie Peak, MD as Attending Physician (Radiation Oncology) Charna Elizabeth, MD as Consulting Physician (Gastroenterology) Annamaria Helling, MD as Consulting Physician (Obstetrics and Gynecology) Salvatore Marvel, MD as Consulting Physician (Orthopedic Surgery) [place], Amy, MD as Consulting Physician (Dermatology) Axel Filler, Larna Daughters, NP as Nurse Practitioner (Hematology and Oncology) Annamaria Helling, MD as Consulting Physician (Obstetrics and Gynecology) Daisy Lazar, DO (Optometry) Sallye Lat, MD as Consulting Physician (Ophthalmology) Ihor Gully, MD (Inactive) as Referring Physician (Urology) Renaldo Fiddler, MD as Consulting Physician (Pain Medicine) Dr. Debby Bud, DDS as Consulting Physician (Dentistry)    Name / MRN / DOB: Lyndsi Ally  [id number]  Jan 04, 1946   Date of visit: 01/14/2023   Chief Complaint/Reason for visit: f/u breat erythema    ASSESSMENT & PLAN: Patient is a 77 y.o. female  with oncologic history of estrogen receptor positive breast cancer followed by Dr. Al Pimple.  I have viewed most recent oncology note and lab work.    #Estrogen receptor positive breast cancer  - Right lumpectomy and radiation in 2018. Completed tamoxifen 05/2022   #F/u right breast erythema - Patient on day 7 of doxycycline. - Had mammogram on 01/10/23 that showed erythema without new skin thickening or mass consistent with infection or inflammation, no evidence of malignancy.  Recommendation was for punch biopsy and MRI to exclude recurrent or inflammatory breast cancer if symptoms do not improve. -Physical exam shows improvement of the erythema.  Will have patient complete 10 days of doxycycline and then start  clindamycin if symptoms have not resolved in order to have longer antibiotic coverage.  -Patient will return to clinic 01/23/2023 for recheck. She knows to RTC sooner if symptoms worsen.  At that time if symptoms have not improved will recommend she follow-up with a surgeon. Patient agreeable with plan.   Strict ED precautions discussed should symptoms worsen.   Heme/Onc History: Oncology History  Malignant neoplasm of upper-inner quadrant of right breast in female, estrogen receptor positive (HCC)  03/03/2017 Initial Biopsy   Right breast upper inner quadrant biopsy: IDC, grade 1, ER+, PR negative, Ki-67 3%, HER-2 negative.     03/12/2017 -  Anti-estrogen oral therapy   Anastrozole started neoadjuvantly and held during chemo, restarted in 06/2017   03/26/2017 Surgery   Right breast lumpectomy: IDC, grade 1, 1.2 cm, 1 SLN with micrometastases, 1 negative,  [id number]  mammaprint high risk indicating a need for chemo   04/12/2017 Genetic Testing   Negative genetic testing on the common hereditary cancer panel.  The Hereditary Gene Panel offered by [facility] includes sequencing and/or deletion duplication testing of the following 46 genes: APC, ATM, AXIN2, BARD1, BMPR1A, BRCA1, BRCA2, BRIP1, CDH1, CDKN2A (p14ARF), CDKN2A (p16INK4a), CHEK2, CTNNA1, DICER1, EPCAM (Deletion/duplication testing only), GREM1 (promoter region deletion/duplication testing only), KIT, MEN1, MLH1, MSH2, MSH3, MSH6, MUTYH, NBN, NF1, NHTL1, PALB2, PDGFRA, PMS2, POLD1, POLE, PTEN, RAD50, RAD51C, RAD51D, SDHB, SDHC, SDHD, SMAD4, SMARCA4. STK11, TP53, TSC1, TSC2, and VHL.  The following genes were evaluated for sequence changes only: SDHA and HOXB13 c.251G>A variant only.  The report date is April 12, 2017.    04/28/2017 - 06/30/2017 Adjuvant Chemotherapy   Docetaxel/cyclophosphamide x 1 cycle, couldn't tolerate, then went on to receive Doxorubicin and Cyclophosphamide x 3 cycles (05/19/17-06/30/17)   07/29/2017 -  08/28/2017 Radiation  Therapy   Adjuvant radiation with Dr. Basilio Cairo: 1) Right breast and axilla treated to 42.56 Gy with 16 fx of 2.66.  2)  boost of 8 Gy with 4 fx of 2 Gy         Interval history-: MADISUN STUCKMAN is a 77 y.o. female with oncologic history as above presenting to [facility] today with chief complaint of f/u on right breast erythema. Patient present unaccompanied to clinic today.  Patient is currently on day 7 of doxycycline. She reports the redness has improved although is still present.  She tenderness with palpation of the nipple otherwise denies any pain. For example when wearing a seat belt she felt mild discomfort. She has not had any fevers or chills.  The fatigue she had previously has resolved.  She denies any discharge from the nipple.    ROS  All other systems are reviewed and are negative for acute change except as noted in the HPI.    Allergies  Allergen Reactions   Adhesive [Tape] Other (See Comments)    SKIN TEARS   Ciprofloxacin Nausea Only   Codeine Nausea Only   Iodine Swelling   Polysporin [Bacitracin-Polymyxin B] Swelling    OPHTHALMIC - SWELLING OF EYES   Penicillins Rash        Sulfa Antibiotics Itching     Past Medical History:  Diagnosis Date   Arthritis    lower back   Cough 07/22/2017   Dental crowns present    Family history of adverse reaction to anesthesia    pt's sister has hx. of post-op N/V   GERD (gastroesophageal reflux disease)    History of chemotherapy    finished chemo 06/30/2017   History of radiation therapy 07/29/17- 08/28/17   Right Breast and Axilla treated to 42.56 Gy with 16 fx of 2.66. Boost of 8 Gy with 4 fx of 2 Gy.    History of right breast cancer 02/2017   Hyperlipidemia    Hypothyroid    Leukopenia 07/07/2017   Personal history of chemotherapy    Personal history of radiation therapy    Stuffy and runny nose 07/22/2017   clear drainage from nose, per pt.     Past Surgical History:  Procedure Laterality Date   BREAST  LUMPECTOMY Right 03/26/2017   BREAST LUMPECTOMY WITH RADIOACTIVE SEED AND SENTINEL LYMPH NODE BIOPSY Right 03/26/2017   Procedure: BREAST LUMPECTOMY WITH RADIOACTIVE SEED AND SENTINEL LYMPH NODE BIOPSY;  Surgeon: Griselda Miner, MD;  Location: [facility];  Service: General;  Laterality: Right;   CARPAL TUNNEL RELEASE Right 01/05/2021   R CTR with RMF trigger release (Dr. Amanda Pea)   CATARACT EXTRACTION Left 2021   CHOLECYSTECTOMY     COLONOSCOPY  10/2007   CYST EXCISION  02/01/2003   knee   KNEE ARTHROSCOPY Right 1990   KNEE ARTHROSCOPY Left 12/12/2010   PORT-A-CATH REMOVAL N/A 09/03/2017   Procedure: REMOVAL PORT-A-CATH;  Surgeon: Griselda Miner, MD;  Location: [facility];  Service: General;  Laterality: N/A;   PORTACATH PLACEMENT Left 04/24/2017   Procedure: INSERTION PORT-A-CATH;  Surgeon: Griselda Miner, MD;  Location: [facility];  Service: General;  Laterality: Left;   TUBAL LIGATION  1983   VARICOSE VEIN SURGERY  09/2009 R, 06/2010 L    Social History   Socioeconomic History   Marital status: Married    Spouse name: Not on file   Number of children: 2   Years of education:  Not on file   Highest education level: Not on file  Occupational History   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  Children in [place] and [place].  1 granddaughter (in [place]).    Retired 05/2014.      Updated 08/2022   Social Determinants of Health   Financial Resource Strain: Not on file  Food Insecurity: Not on file  Transportation Needs: Not on  file  Physical Activity: Not on file  Stress: Not on file  Social Connections: Not on file  Intimate Partner Violence: Not on file    Family History  Problem Relation Age of Onset   Hypertension Mother    Heart disease Mother    Gallbladder disease Mother    Thyroid disease Mother    Arthritis Mother    Diabetes Father    Cancer Father        pancreatic   Hypertension Sister    Gallbladder disease Sister    Diabetes Sister    Diverticulitis Sister        of small intestine   COPD Sister    Stroke Sister        late 50's   Hypertension Sister    Pulmonary embolism Sister    Ulcerative colitis Sister    COPD Brother        had lung lobe removed for suspected cancer, but was benign; smoker   AAA (abdominal aortic aneurysm) Brother    Emphysema Brother    Diabetes Son        great toes amputated at 34   Cancer Son        testicular cancer   Lung cancer Cousin        maternal first cousin - smoker   Colon cancer Other 52     Current Outpatient Medications:    [START ON 01/18/2023] clindamycin (CLEOCIN) 300 MG capsule, Take 1 capsule (300 mg total) by mouth 3 (three) times daily for 10 days., Disp: 30 capsule, Rfl: 0   acetaminophen (TYLENOL) 500 MG tablet, Take 1,000 mg by mouth every 6 (six) hours as needed. (Patient not taking: Reported on 09/16/2022), Disp: , Rfl:    azithromycin (ZITHROMAX) 250 MG tablet, Take 1 tablet (250 mg total) by mouth daily. Take first 2 tablets together, then 1 every day until finished., Disp: 6 tablet, Rfl: 0   calcium carbonate (TUMS - DOSED IN MG ELEMENTAL CALCIUM) 500 MG chewable tablet, Chew 1 tablet by mouth daily., Disp: , Rfl:    calcium-vitamin D (OSCAL WITH D) 500-200 MG-UNIT tablet, Take 1 tablet by mouth daily., Disp: , Rfl:    clobetasol cream (TEMOVATE) 0.05 %, Apply topically 2 (two) times daily as needed. (Patient not taking: Reported on 09/16/2022), Disp: 60 g, Rfl: 0   Coenzyme Q10 (CO Q-10) 100 MG CAPS, Take by mouth daily.,  Disp: , Rfl:    doxycycline (VIBRA-TABS) 100 MG tablet, Take 1 tablet (100 mg total) by mouth 2 (two) times daily for 10 days., Disp:  20 tablet, Rfl: 0   fluticasone (FLONASE) 50 MCG/ACT nasal spray, Place 2 sprays into both nostrils daily., Disp: 10 mL, Rfl: 2   hydrochlorothiazide (MICROZIDE) 12.5 MG capsule, Take 1 capsule (12.5 mg total) by mouth daily., Disp: 90 capsule, Rfl: 0   levothyroxine (SYNTHROID) 88 MCG tablet, Take 1 tablet (88 mcg total) by mouth daily., Disp: 90 tablet, Rfl: 3   loratadine (CLARITIN) 10 MG tablet, Take 10 mg by mouth daily as needed for allergies.  (Patient not taking: Reported on 08/01/2022), Disp: , Rfl:    Multiple Vitamins-Minerals (ALIVE WOMENS 50+ PO), Take 1 tablet by mouth daily. , Disp: , Rfl:    Multiple Vitamins-Minerals (PRESERVISION AREDS 2 PO), Take 1 tablet by mouth in the morning and at bedtime., Disp: , Rfl:    Probiotic Product (ALIGN PO), Take 1 capsule by mouth daily., Disp: , Rfl:    rosuvastatin (CRESTOR) 10 MG tablet, Take 1 tablet by mouth every other day, Disp: 45 tablet, Rfl: 1  PHYSICAL EXAM: ECOG FS:1 - Symptomatic but completely ambulatory    Vitals:   01/14/23 1249  BP: (!) 152/76  Pulse: 60  Resp: 16  Temp: 97.6 F (36.4 C)  TempSrc: Oral  SpO2: 100%  Weight: 170 lb 14.4 oz (77.5 kg)   Physical Exam Vitals and nursing note reviewed.  Constitutional:      Appearance: She is not ill-appearing or toxic-appearing.  HENT:     Head: Normocephalic.  Eyes:     Conjunctiva/sclera: Conjunctivae normal.  Cardiovascular:     Rate and Rhythm: Normal rate.     Pulses: Normal pulses.  Pulmonary:     Effort: Pulmonary effort is normal.  Chest:     Comments: No tenderness to palpation of right breast. Erythematous.  Please see media below Abdominal:     General: There is no distension.  Musculoskeletal:     Cervical back: Normal range of motion.  Skin:    General: Skin is warm and dry.  Neurological:     Mental Status:  She is alert.        LABORATORY DATA: I have reviewed the data as listed    Latest Ref Rng & Units 05/31/2022   12:58 PM 05/07/2021    2:37 PM 05/04/2020    2:30 PM  CBC  WBC 4.0 - 10.5 K/uL 4.7  4.2  4.4   Hemoglobin 12.0 - 15.0 g/dL 69.6  29.5  28.4   Hematocrit 36.0 - 46.0 % 36.8  36.0  37.7   Platelets 150 - 400 K/uL 150  137  137         Latest Ref Rng & Units 09/16/2022   11:07 AM 05/31/2022   12:58 PM 09/12/2021    9:37 AM  CMP  Glucose 70 - 99 mg/dL 98  94  94   BUN 8 - 23 mg/dL  15    Creatinine 1.32 - 1.00 mg/dL  4.40    Sodium 102 - 725 mmol/L  140    Potassium 3.5 - 5.1 mmol/L  4.3    Chloride 98 - 111 mmol/L  106    CO2 22 - 32 mmol/L  31    Calcium 8.9 - 10.3 mg/dL  9.2    Total Protein 6.5 - 8.1 g/dL  6.9    Total Bilirubin 0.3 - 1.2 mg/dL  0.4    Alkaline Phos 38 - 126 U/L  49    AST 15 - 41 U/L  16  ALT 0 - 44 U/L  12         RADIOGRAPHIC STUDIES (from last 24 hours if applicable) I have personally reviewed the radiological images as listed and agreed with the findings in the report. No results found.      Visit Diagnosis: 1. Malignant neoplasm of upper-inner quadrant of right breast in female, estrogen receptor positive (HCC)   2. Erythema of breast      No orders of the defined types were placed in this encounter.   All questions were answered. The patient knows to call the clinic with any problems, questions or concerns. No barriers to learning was detected.  A total of more than 30 minutes were spent on this encounter with face-to-face time and non-face-to-face time, including preparing to see the patient, ordering tests and/or medications, counseling the patient and coordination of care as outlined above.    Thank you for allowing me to participate in the care of this patient.    Shanon Ace, PA-C Department of Hematology/Oncology [facility] Phone: [phone]  [phone]  [phone]    01/14/2023 4:23 PM

## 2023-01-16 ENCOUNTER — Encounter: Payer: Self-pay | Admitting: Emergency Medicine

## 2023-01-16 ENCOUNTER — Ambulatory Visit
Admission: EM | Admit: 2023-01-16 | Discharge: 2023-01-16 | Disposition: A | Discharge status: Home / Self Care | Payer: Medicare HMO | Attending: Nurse Practitioner | Admitting: Nurse Practitioner

## 2023-01-16 DIAGNOSIS — R21 Rash and other nonspecific skin eruption: Secondary | ICD-10-CM

## 2023-01-16 MED ORDER — DEXAMETHASONE SODIUM PHOSPHATE 10 MG/ML IJ SOLN
10.0000 mg | INTRAMUSCULAR | Status: AC
  Administered 2023-01-16: 10 mg via INTRAMUSCULAR

## 2023-01-16 MED ORDER — VALACYCLOVIR HCL 1 G PO TABS: 1000.0000 mg | ORAL_TABLET | Freq: Three times a day (TID) | ORAL | 0 refills | Status: DC

## 2023-01-16 MED ORDER — PREDNISONE 20 MG PO TABS: 20.0000 mg | ORAL_TABLET | Freq: Every day | ORAL | 0 refills | Status: AC

## 2023-01-16 NOTE — Discharge Instructions (Signed)
Take medication as prescribed. May also take over-the-counter Zyrtec to help with itching during the daytime and Benadryl at bedtime. Keep the skin clean and dry. Avoid use of make-up while symptoms persist. Recommend taking lukewarm baths. May apply cool cloths to the area to help with itching or discomfort. Avoid scratching, rubbing, or manipulating the areas while symptoms persist. Recommend Aveeno colloidal oatmeal bath to use to help with drying and itching. If symptoms do not improve with this treatment, please follow-up with your primary care physician for further evaluation. Follow-up as needed.

## 2023-01-16 NOTE — ED Triage Notes (Signed)
Rash on right side of face  since yesterday that is now coming up on right side of neck.  Currently on doxycyline for breast infection.  Has been on it for 8 days.

## 2023-01-16 NOTE — ED Provider Notes (Signed)
[facility]    CSN: [id number] Arrival date & time: 01/16/23  1022      History   Chief Complaint No chief complaint on file.   HPI Mckenzie Webb is a 77 y.o. female.   The history is provided by the patient.   Patient presents for complaints of rash to the right side of her face and the right neck that is been present for the past 24 hours.  Patient states the rash is "itchy and drawing".  She states that the rash does not feel painful, and she did not have any tingling or odd sensations in the face before the symptoms started.  Patient denies fever, chills, eye pain, eye redness, exposure to new foods, soaps, medications, detergents, lotions, or environmental triggers.  She reports that she is currently taking doxycycline which was prescribed by her oncologist for a possible infection in her breast, states she is on day 8 of the medication.  Patient states that she has had the single dose shingles vaccine in the past.  Past Medical History:  Diagnosis Date   Arthritis    lower back   Cough 07/22/2017   Dental crowns present    Family history of adverse reaction to anesthesia    pt's sister has hx. of post-op N/V   GERD (gastroesophageal reflux disease)    History of chemotherapy    finished chemo 06/30/2017   History of radiation therapy 07/29/17- 08/28/17   Right Breast and Axilla treated to 42.56 Gy with 16 fx of 2.66. Boost of 8 Gy with 4 fx of 2 Gy.    History of right breast cancer 02/2017   Hyperlipidemia    Hypothyroid    Leukopenia 07/07/2017   Personal history of chemotherapy    Personal history of radiation therapy    Stuffy and runny nose 07/22/2017   clear drainage from nose, per pt.    Patient Active Problem List   Diagnosis Date Noted   Mixed hyperlipidemia 09/17/2022   Senile purpura (HCC) 03/10/2020   Plant dermatitis 12/13/2019   Hot flashes due to tamoxifen 09/06/2019   Morbid exogenous obesity (HCC) 09/28/2017   Port catheter in  place 04/28/2017   Genetic testing 04/17/2017   Family history of pancreatic cancer    Family history of colon cancer    Malignant neoplasm of upper-inner quadrant of right breast in female, estrogen receptor positive (HCC) 03/11/2017   Essential hypertension, benign 07/31/2015   IFG (impaired fasting glucose) 05/03/2015   Psoriasis 05/03/2014   Greater trochanteric bursitis of right hip 03/14/2014   Obesity (BMI 30-39.9) 05/26/2013   GERD (gastroesophageal reflux disease) 12/31/2010   Hypothyroidism 12/31/2010   Vitamin D deficiency 12/31/2010   Pure hypercholesterolemia 12/31/2010    Past Surgical History:  Procedure Laterality Date   BREAST LUMPECTOMY Right 03/26/2017   BREAST LUMPECTOMY WITH RADIOACTIVE SEED AND SENTINEL LYMPH NODE BIOPSY Right 03/26/2017   Procedure: BREAST LUMPECTOMY WITH RADIOACTIVE SEED AND SENTINEL LYMPH NODE BIOPSY;  Surgeon: Griselda Miner, MD;  Location: [facility];  Service: General;  Laterality: Right;   CARPAL TUNNEL RELEASE Right 01/05/2021   R CTR with RMF trigger release (Dr. Amanda Pea)   CATARACT EXTRACTION Left 2021   CHOLECYSTECTOMY     COLONOSCOPY  10/2007   CYST EXCISION  02/01/2003   knee   KNEE ARTHROSCOPY Right 1990   KNEE ARTHROSCOPY Left 12/12/2010   PORT-A-CATH REMOVAL N/A 09/03/2017   Procedure: REMOVAL PORT-A-CATH;  Surgeon: Chevis Pretty III,  MD;  Location: [facility];  Service: General;  Laterality: N/A;   PORTACATH PLACEMENT Left 04/24/2017   Procedure: INSERTION PORT-A-CATH;  Surgeon: Griselda Miner, MD;  Location: [facility];  Service: General;  Laterality: Left;   TUBAL LIGATION  1983   VARICOSE VEIN SURGERY  09/2009 R, 06/2010 L    OB History     Gravida  2   Para  2   Term      Preterm      AB      Living  2      SAB      IAB      Ectopic      Multiple      Live Births               Home Medications    Prior to Admission medications   Medication Sig Start Date End Date  Taking? Authorizing Provider  predniSONE (DELTASONE) 20 MG tablet Take 1 tablet (20 mg total) by mouth daily with breakfast for 5 days. 01/16/23 01/21/23 Yes Levar Fayson-Warren, Sadie Haber, NP  valACYclovir (VALTREX) 1000 MG tablet Take 1 tablet (1,000 mg total) by mouth 3 (three) times daily. 01/16/23  Yes Zyla Dascenzo-Warren, Sadie Haber, NP  acetaminophen (TYLENOL) 500 MG tablet Take 1,000 mg by mouth every 6 (six) hours as needed. Patient not taking: Reported on 09/16/2022    [provider]  calcium carbonate (TUMS - DOSED IN MG ELEMENTAL CALCIUM) 500 MG chewable tablet Chew 1 tablet by mouth daily.    [provider]  calcium-vitamin D (OSCAL WITH D) 500-200 MG-UNIT tablet Take 1 tablet by mouth daily.    [provider]  clobetasol cream (TEMOVATE) 0.05 % Apply topically 2 (two) times daily as needed. Patient not taking: Reported on 09/16/2022 04/27/20   Joselyn Arrow, MD  Coenzyme Q10 (CO Q-10) 100 MG CAPS Take by mouth daily.    [provider]  doxycycline (VIBRA-TABS) 100 MG tablet Take 1 tablet (100 mg total) by mouth 2 (two) times daily for 10 days. 01/08/23 01/18/23  Walisiewicz, Yvonna Alanis E, PA-C  fluticasone (FLONASE) 50 MCG/ACT nasal spray Place 2 sprays into both nostrils daily. 10/30/22   Barbette Merino, NP  hydrochlorothiazide (MICROZIDE) 12.5 MG capsule Take 1 capsule (12.5 mg total) by mouth daily. 10/24/22   Joselyn Arrow, MD  levothyroxine (SYNTHROID) 88 MCG tablet Take 1 tablet (88 mcg total) by mouth daily. 09/17/22   Joselyn Arrow, MD  loratadine (CLARITIN) 10 MG tablet Take 10 mg by mouth daily as needed for allergies.  Patient not taking: Reported on 08/01/2022    [provider]  Multiple Vitamins-Minerals (ALIVE WOMENS 50+ PO) Take 1 tablet by mouth daily.     [provider]  Multiple Vitamins-Minerals (PRESERVISION AREDS 2 PO) Take 1 tablet by mouth in the morning and at bedtime.    [provider]  Probiotic Product (ALIGN PO) Take 1 capsule  by mouth daily.    [provider]  rosuvastatin (CRESTOR) 10 MG tablet Take 1 tablet by mouth every other day 09/17/22   Joselyn Arrow, MD    Family History Family History  Problem Relation Age of Onset   Hypertension Mother    Heart disease Mother    Gallbladder disease Mother    Thyroid disease Mother    Arthritis Mother    Diabetes Father    Cancer Father        pancreatic   Hypertension Sister  Gallbladder disease Sister    Diabetes Sister    Diverticulitis Sister        of small intestine   COPD Sister    Stroke Sister        late 50's   Hypertension Sister    Pulmonary embolism Sister    Ulcerative colitis Sister    COPD Brother        had lung lobe removed for suspected cancer, but was benign; smoker   AAA (abdominal aortic aneurysm) Brother    Emphysema Brother    Diabetes Son        great toes amputated at 43   Cancer Son        testicular cancer   Lung cancer Cousin        maternal first cousin - smoker   Colon cancer Other 53    Social History Social History   Tobacco Use   Smoking status: Never   Smokeless tobacco: Never  Vaping Use   Vaping Use: Never used  Substance Use Topics   Alcohol use: Not Currently    Comment: none since her cancer diagnosis 2018; rare margarita   Drug use: No     Allergies   Adhesive [tape], Ciprofloxacin, Codeine, Iodine, Polysporin [bacitracin-polymyxin b], Penicillins, and Sulfa antibiotics   Review of Systems Review of Systems Per HPI  Physical Exam Triage Vital Signs ED Triage Vitals [01/16/23 1045]  Enc Vitals Group     BP (!) 157/87     Pulse Rate 66     Resp 18     Temp 98 F (36.7 C)     Temp Source Oral     SpO2 98 %     Weight      Height      Head Circumference      Peak Flow      Pain Score 0     Pain Loc      Pain Edu?      Excl. in GC?    No data found.  Updated Vital Signs BP (!) 157/87 (BP Location: Right Arm)   Pulse 66   Temp 98 F (36.7 C) (Oral)   Resp 18    SpO2 98%   Visual Acuity Right Eye Distance:   Left Eye Distance:   Bilateral Distance:    Right Eye Near:   Left Eye Near:    Bilateral Near:     Physical Exam Vitals and nursing note reviewed.  Constitutional:      General: She is not in acute distress.    Appearance: Normal appearance.  HENT:     Head: Normocephalic.  Eyes:     Extraocular Movements: Extraocular movements intact.     Conjunctiva/sclera: Conjunctivae normal.     Pupils: Pupils are equal, round, and reactive to light.  Cardiovascular:     Rate and Rhythm: Normal rate and regular rhythm.     Pulses: Normal pulses.     Heart sounds: Normal heart sounds.  Pulmonary:     Effort: Pulmonary effort is normal. No respiratory distress.     Breath sounds: Normal breath sounds. No stridor. No wheezing, rhonchi or rales.  Abdominal:     General: Bowel sounds are normal.     Palpations: Abdomen is soft.  Musculoskeletal:     Cervical back: Normal range of motion.  Lymphadenopathy:     Cervical: No cervical adenopathy.  Skin:    General: Skin is warm and dry.     Findings:  Rash present.     Comments: Erythematous patch noted to the right side of the face under the right eye that extends to the bridge of the right side of the nose.  3 erythematous patches also noted along the right side of the neck.  There is no oozing, fluctuance, or drainage present.  Neurological:     General: No focal deficit present.     Mental Status: She is alert and oriented to person, place, and time.  Psychiatric:        Mood and Affect: Mood normal.        Behavior: Behavior normal.      UC Treatments / Results  Labs (all labs ordered are listed, but only abnormal results are displayed) Labs Reviewed - No data to display  EKG   Radiology No results found.  Procedures Procedures (including critical care time)  Medications Ordered in UC Medications  dexamethasone (DECADRON) injection 10 mg (10 mg Intramuscular Given  01/16/23 1116)    Initial Impression / Assessment and Plan / UC Course  I have reviewed the triage vital signs and the nursing notes.  Pertinent labs & imaging results that were available during my care of the patient were reviewed by me and considered in my medical decision making (see chart for details).  The patient is well-appearing, she is in no acute distress, vital signs are stable.  Difficult to ascertain because of origin of this rash.  Patient was administered Decadron 10 mg IM for itching.  Cannot rule out herpes zoster at this time, valacyclovir 1 g was prescribed along with prednisone 20 mg daily for itching.  Supportive care recommendations were provided and discussed with the patient to include keeping the skin clean and dry, avoid use of make-up while symptoms persist, and over-the-counter Benadryl at bedtime to help with itching. Patient was advised to follow-up with her PCP if symptoms do not improve. Patient is in agreement with this plan of care and verbalizes understanding.  All questions were answered, patient is stable for discharge.  Final Clinical Impressions(s) / UC Diagnoses   Final diagnoses:  Rash and nonspecific skin eruption     Discharge Instructions      Take medication as prescribed. May also take over-the-counter Zyrtec to help with itching during the daytime and Benadryl at bedtime. Keep the skin clean and dry. Avoid use of make-up while symptoms persist. Recommend taking lukewarm baths. May apply cool cloths to the area to help with itching or discomfort. Avoid scratching, rubbing, or manipulating the areas while symptoms persist. Recommend Aveeno colloidal oatmeal bath to use to help with drying and itching. If symptoms do not improve with this treatment, please follow-up with your primary care physician for further evaluation. Follow-up as needed.     ED Prescriptions     Medication Sig Dispense Auth. Provider   valACYclovir (VALTREX) 1000  MG tablet Take 1 tablet (1,000 mg total) by mouth 3 (three) times daily. 21 tablet Dedra Matsuo-Warren, Sadie Haber, NP   predniSONE (DELTASONE) 20 MG tablet Take 1 tablet (20 mg total) by mouth daily with breakfast for 5 days. 5 tablet Halima Fogal-Warren, Sadie Haber, NP      PDMP not reviewed this encounter.   Abran Cantor, NP 01/16/23 1120

## 2023-01-22 ENCOUNTER — Other Ambulatory Visit: Payer: Self-pay | Admitting: Family Medicine

## 2023-01-22 DIAGNOSIS — I10 Essential (primary) hypertension: Secondary | ICD-10-CM

## 2023-01-22 NOTE — Progress Notes (Unsigned)
Symptom Management Consult Note [facility]    Patient Care Team: Joselyn Arrow, MD as PCP - General (Family Medicine) Griselda Miner, MD as Consulting Physician (General Surgery) Magrinat, Valentino Hue, MD (Inactive) as Consulting Physician (Oncology) Lonie Peak, MD as Attending Physician (Radiation Oncology) Charna Elizabeth, MD as Consulting Physician (Gastroenterology) Annamaria Helling, MD as Consulting Physician (Obstetrics and Gynecology) Salvatore Marvel, MD as Consulting Physician (Orthopedic Surgery) [place], Amy, MD as Consulting Physician (Dermatology) Axel Filler, Larna Daughters, NP as Nurse Practitioner (Hematology and Oncology) Annamaria Helling, MD as Consulting Physician (Obstetrics and Gynecology) Daisy Lazar, DO (Optometry) Sallye Lat, MD as Consulting Physician (Ophthalmology) Ihor Gully, MD (Inactive) as Referring Physician (Urology) Renaldo Fiddler, MD as Consulting Physician (Pain Medicine) Dr. Debby Bud, DDS as Consulting Physician (Dentistry)    Name / MRN / DOB: Mckenzie Webb  [id number]  1946/02/27   Date of visit: 01/23/2023   Chief Complaint/Reason for visit: f/u breast erythema   Current Therapy:   Last treatment:  Day ***   Cycle *** on   ASSESSMENT & PLAN: Patient is a 77 y.o. female  with oncologic history of estrogen receptor positive breast cancer followed by Dr. Al Pimple.  I have viewed most recent oncology note and lab work.    #estrogen receptor positive breast cancer - Next appointment with oncologist is   #       Heme/Onc History: Oncology History  Malignant neoplasm of upper-inner quadrant of right breast in female, estrogen receptor positive (HCC)  03/03/2017 Initial Biopsy   Right breast upper inner quadrant biopsy: IDC, grade 1, ER+, PR negative, Ki-67 3%, HER-2 negative.     03/12/2017 -  Anti-estrogen oral therapy   Anastrozole started neoadjuvantly and held during chemo, restarted in 06/2017   03/26/2017  Surgery   Right breast lumpectomy: IDC, grade 1, 1.2 cm, 1 SLN with micrometastases, 1 negative,  [id number]  mammaprint high risk indicating a need for chemo   04/12/2017 Genetic Testing   Negative genetic testing on the common hereditary cancer panel.  The Hereditary Gene Panel offered by [facility] includes sequencing and/or deletion duplication testing of the following 46 genes: APC, ATM, AXIN2, BARD1, BMPR1A, BRCA1, BRCA2, BRIP1, CDH1, CDKN2A (p14ARF), CDKN2A (p16INK4a), CHEK2, CTNNA1, DICER1, EPCAM (Deletion/duplication testing only), GREM1 (promoter region deletion/duplication testing only), KIT, MEN1, MLH1, MSH2, MSH3, MSH6, MUTYH, NBN, NF1, NHTL1, PALB2, PDGFRA, PMS2, POLD1, POLE, PTEN, RAD50, RAD51C, RAD51D, SDHB, SDHC, SDHD, SMAD4, SMARCA4. STK11, TP53, TSC1, TSC2, and VHL.  The following genes were evaluated for sequence changes only: SDHA and HOXB13 c.251G>A variant only.  The report date is April 12, 2017.    04/28/2017 - 06/30/2017 Adjuvant Chemotherapy   Docetaxel/cyclophosphamide x 1 cycle, couldn't tolerate, then went on to receive Doxorubicin and Cyclophosphamide x 3 cycles (05/19/17-06/30/17)   07/29/2017 - 08/28/2017 Radiation Therapy   Adjuvant radiation with Dr. Basilio Cairo: 1) Right breast and axilla treated to 42.56 Gy with 16 fx of 2.66.  2)  boost of 8 Gy with 4 fx of 2 Gy         Interval history-: Mckenzie Webb is a 77 y.o. female with oncologic history as above presenting to [facility] today with chief complaint of      ROS  All other systems are reviewed and are negative for acute change except as noted in the HPI.    Allergies  Allergen Reactions   Adhesive [Tape] Other (See Comments)    SKIN TEARS   Ciprofloxacin Nausea Only  Codeine Nausea Only   Iodine Swelling   Polysporin [Bacitracin-Polymyxin B] Swelling    OPHTHALMIC - SWELLING OF EYES   Penicillins Rash        Sulfa Antibiotics Itching     Past Medical History:  Diagnosis Date   Arthritis     lower back   Cough 07/22/2017   Dental crowns present    Family history of adverse reaction to anesthesia    pt's sister has hx. of post-op N/V   GERD (gastroesophageal reflux disease)    History of chemotherapy    finished chemo 06/30/2017   History of radiation therapy 07/29/17- 08/28/17   Right Breast and Axilla treated to 42.56 Gy with 16 fx of 2.66. Boost of 8 Gy with 4 fx of 2 Gy.    History of right breast cancer 02/2017   Hyperlipidemia    Hypothyroid    Leukopenia 07/07/2017   Personal history of chemotherapy    Personal history of radiation therapy    Stuffy and runny nose 07/22/2017   clear drainage from nose, per pt.     Past Surgical History:  Procedure Laterality Date   BREAST LUMPECTOMY Right 03/26/2017   BREAST LUMPECTOMY WITH RADIOACTIVE SEED AND SENTINEL LYMPH NODE BIOPSY Right 03/26/2017   Procedure: BREAST LUMPECTOMY WITH RADIOACTIVE SEED AND SENTINEL LYMPH NODE BIOPSY;  Surgeon: Griselda Miner, MD;  Location: [facility];  Service: General;  Laterality: Right;   CARPAL TUNNEL RELEASE Right 01/05/2021   R CTR with RMF trigger release (Dr. Amanda Pea)   CATARACT EXTRACTION Left 2021   CHOLECYSTECTOMY     COLONOSCOPY  10/2007   CYST EXCISION  02/01/2003   knee   KNEE ARTHROSCOPY Right 1990   KNEE ARTHROSCOPY Left 12/12/2010   PORT-A-CATH REMOVAL N/A 09/03/2017   Procedure: REMOVAL PORT-A-CATH;  Surgeon: Griselda Miner, MD;  Location: [facility];  Service: General;  Laterality: N/A;   PORTACATH PLACEMENT Left 04/24/2017   Procedure: INSERTION PORT-A-CATH;  Surgeon: Griselda Miner, MD;  Location: [facility];  Service: General;  Laterality: Left;   TUBAL LIGATION  1983   VARICOSE VEIN SURGERY  09/2009 R, 06/2010 L    Social History   Socioeconomic History   Marital status: Married    Spouse name: Not on file   Number of children: 2   Years of education: Not on file   Highest education level: Not on file  Occupational History    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  Children in [place] and [place].  1 granddaughter (in [place]).    Retired 05/2014.      Updated 08/2022   Social Determinants of Health   Financial Resource Strain: Not on file  Food Insecurity: Not on file  Transportation Needs: Not on file  Physical Activity: Not on file  Stress: Not on file  Social Connections: Not on file  Intimate Partner Violence: Not on file    Family History  Problem Relation Age of Onset  Hypertension Mother    Heart disease Mother    Gallbladder disease Mother    Thyroid disease Mother    Arthritis Mother    Diabetes Father    Cancer Father        pancreatic   Hypertension Sister    Gallbladder disease Sister    Diabetes Sister    Diverticulitis Sister        of small intestine   COPD Sister    Stroke Sister        late 50's   Hypertension Sister    Pulmonary embolism Sister    Ulcerative colitis Sister    COPD Brother        had lung lobe removed for suspected cancer, but was benign; smoker   AAA (abdominal aortic aneurysm) Brother    Emphysema Brother    Diabetes Son        great toes amputated at 12   Cancer Son        testicular cancer   Lung cancer Cousin        maternal first cousin - smoker   Colon cancer Other 52     Current Outpatient Medications:    acetaminophen (TYLENOL) 500 MG tablet, Take 1,000 mg by mouth every 6 (six) hours as needed. (Patient not taking: Reported on 09/16/2022), Disp: , Rfl:    calcium carbonate (TUMS - DOSED IN MG ELEMENTAL CALCIUM)  500 MG chewable tablet, Chew 1 tablet by mouth daily., Disp: , Rfl:    calcium-vitamin D (OSCAL WITH D) 500-200 MG-UNIT tablet, Take 1 tablet by mouth daily., Disp: , Rfl:    clobetasol cream (TEMOVATE) 0.05 %, Apply topically 2 (two) times daily as needed. (Patient not taking: Reported on 09/16/2022), Disp: 60 g, Rfl: 0   Coenzyme Q10 (CO Q-10) 100 MG CAPS, Take by mouth daily., Disp: , Rfl:    fluticasone (FLONASE) 50 MCG/ACT nasal spray, Place 2 sprays into both nostrils daily., Disp: 10 mL, Rfl: 2   hydrochlorothiazide (MICROZIDE) 12.5 MG capsule, TAKE 1 CAPSULE BY MOUTH EVERY DAY, Disp: 90 capsule, Rfl: 0   levothyroxine (SYNTHROID) 88 MCG tablet, Take 1 tablet (88 mcg total) by mouth daily., Disp: 90 tablet, Rfl: 3   loratadine (CLARITIN) 10 MG tablet, Take 10 mg by mouth daily as needed for allergies.  (Patient not taking: Reported on 08/01/2022), Disp: , Rfl:    Multiple Vitamins-Minerals (ALIVE WOMENS 50+ PO), Take 1 tablet by mouth daily. , Disp: , Rfl:    Multiple Vitamins-Minerals (PRESERVISION AREDS 2 PO), Take 1 tablet by mouth in the morning and at bedtime., Disp: , Rfl:    Probiotic Product (ALIGN PO), Take 1 capsule by mouth daily., Disp: , Rfl:    rosuvastatin (CRESTOR) 10 MG tablet, Take 1 tablet by mouth every other day, Disp: 45 tablet, Rfl: 1   valACYclovir (VALTREX) 1000 MG tablet, Take 1 tablet (1,000 mg total) by mouth 3 (three) times daily., Disp: 21 tablet, Rfl: 0  PHYSICAL EXAM: ECOG FS:{CHL ONC [id number]}   There were no vitals filed for this visit. Physical Exam     LABORATORY DATA: I have reviewed the data as listed    Latest Ref Rng & Units 05/31/2022   12:58 PM 05/07/2021    2:37 PM 05/04/2020    2:30 PM  CBC  WBC 4.0 - 10.5 K/uL 4.7  4.2  4.4   Hemoglobin 12.0 - 15.0 g/dL 86.5  78.4  69.6   Hematocrit 36.0 - 46.0 % 36.8  36.0  37.7   Platelets 150 - 400 K/uL 150  137  137         Latest Ref Rng & Units 09/16/2022   11:07 AM 05/31/2022   12:58  PM 09/12/2021    9:37 AM  CMP  Glucose 70 - 99 mg/dL 98  94  94   BUN 8 - 23 mg/dL  15    Creatinine 6.21 - 1.00 mg/dL  3.08    Sodium 657 - 846 mmol/L  140    Potassium 3.5 - 5.1 mmol/L  4.3    Chloride 98 - 111 mmol/L  106    CO2 22 - 32 mmol/L  31    Calcium 8.9 - 10.3 mg/dL  9.2    Total Protein 6.5 - 8.1 g/dL  6.9    Total Bilirubin 0.3 - 1.2 mg/dL  0.4    Alkaline Phos 38 - 126 U/L  49    AST 15 - 41 U/L  16    ALT 0 - 44 U/L  12         RADIOGRAPHIC STUDIES (from last 24 hours if applicable) I have personally reviewed the radiological images as listed and agreed with the findings in the report. No results found.      Visit Diagnosis: No diagnosis found.   No orders of the defined types were placed in this encounter.   All questions were answered. The patient knows to call the clinic with any problems, questions or concerns. No barriers to learning was detected.  A total of more than *** minutes were spent on this encounter with face-to-face time and non-face-to-face time, including preparing to see the patient, ordering tests and/or medications, counseling the patient and coordination of care as outlined above.    Thank you for allowing me to participate in the care of this patient.    Shanon Ace, PA-C Department of Hematology/Oncology [facility] Phone: [phone]  [phone]    01/22/2023 10:12 PM

## 2023-01-23 ENCOUNTER — Other Ambulatory Visit: Payer: Self-pay

## 2023-01-23 ENCOUNTER — Inpatient Hospital Stay: Payer: Medicare HMO | Attending: Physician Assistant | Admitting: Physician Assistant

## 2023-01-23 VITALS — BP 134/72 | HR 72 | Temp 97.7°F | Resp 18 | Wt 167.8 lb

## 2023-01-23 DIAGNOSIS — Z8043 Family history of malignant neoplasm of testis: Secondary | ICD-10-CM | POA: Diagnosis not present

## 2023-01-23 DIAGNOSIS — Z79899 Other long term (current) drug therapy: Secondary | ICD-10-CM | POA: Diagnosis not present

## 2023-01-23 DIAGNOSIS — Z9221 Personal history of antineoplastic chemotherapy: Secondary | ICD-10-CM | POA: Diagnosis not present

## 2023-01-23 DIAGNOSIS — Z923 Personal history of irradiation: Secondary | ICD-10-CM | POA: Diagnosis not present

## 2023-01-23 DIAGNOSIS — Z170 Estrogen receptor positive status [ER+]: Secondary | ICD-10-CM | POA: Diagnosis not present

## 2023-01-23 DIAGNOSIS — Z800 Family history of malignant neoplasm of digestive organs: Secondary | ICD-10-CM | POA: Diagnosis not present

## 2023-01-23 DIAGNOSIS — C50211 Malignant neoplasm of upper-inner quadrant of right female breast: Secondary | ICD-10-CM

## 2023-01-23 DIAGNOSIS — Z853 Personal history of malignant neoplasm of breast: Secondary | ICD-10-CM | POA: Diagnosis not present

## 2023-01-23 DIAGNOSIS — Z801 Family history of malignant neoplasm of trachea, bronchus and lung: Secondary | ICD-10-CM | POA: Insufficient documentation

## 2023-01-23 DIAGNOSIS — L539 Erythematous condition, unspecified: Secondary | ICD-10-CM | POA: Diagnosis not present

## 2023-01-24 ENCOUNTER — Telehealth: Payer: Self-pay | Admitting: *Deleted

## 2023-01-24 DIAGNOSIS — C50211 Malignant neoplasm of upper-inner quadrant of right female breast: Secondary | ICD-10-CM

## 2023-01-24 NOTE — Telephone Encounter (Signed)
Called pt with recommendations from Dr. Carolynne Edouard if redness to breast worsens to call his office to be seen next week (01/27/23). Informed per Dr. Carolynne Edouard after review of imaging, notes, pictures and that the redness is in fact getting better, MRI and punch bx is not warranted at this time. Received verbal understanding. No further needs voiced.

## 2023-01-24 NOTE — Telephone Encounter (Signed)
Received call from pt regarding needing appt with Dr. Carolynne Edouard to assess right breast redness persisting after 2 rounds of abx recommended by APP in symptom management. Msg sent to Dr. Carolynne Edouard and his nurse as well as their urgent office app to get the pt in for an assessment to determine further abx, MRI, or punch bx. Informed pt will call with update. Received verbal understanding. No further needs voiced at this time.

## 2023-02-04 DIAGNOSIS — R159 Full incontinence of feces: Secondary | ICD-10-CM | POA: Diagnosis not present

## 2023-02-04 DIAGNOSIS — K219 Gastro-esophageal reflux disease without esophagitis: Secondary | ICD-10-CM | POA: Diagnosis not present

## 2023-02-04 DIAGNOSIS — K625 Hemorrhage of anus and rectum: Secondary | ICD-10-CM | POA: Diagnosis not present

## 2023-02-04 DIAGNOSIS — R194 Change in bowel habit: Secondary | ICD-10-CM | POA: Diagnosis not present

## 2023-02-05 ENCOUNTER — Other Ambulatory Visit: Payer: Self-pay | Admitting: Hematology and Oncology

## 2023-02-05 DIAGNOSIS — Z1231 Encounter for screening mammogram for malignant neoplasm of breast: Secondary | ICD-10-CM

## 2023-03-10 ENCOUNTER — Other Ambulatory Visit: Payer: Self-pay | Admitting: Family Medicine

## 2023-03-10 DIAGNOSIS — E782 Mixed hyperlipidemia: Secondary | ICD-10-CM

## 2023-03-11 NOTE — Telephone Encounter (Signed)
Pt. Called back stating she did not need this refilled before her apt. Next week.

## 2023-03-11 NOTE — Telephone Encounter (Signed)
Left message for pt to call back.   Does she need a refill before her appt with Dr. Lynelle Doctor next week

## 2023-03-17 ENCOUNTER — Ambulatory Visit: Admission: RE | Admit: 2023-03-17 | Payer: Medicare HMO | Source: Ambulatory Visit

## 2023-03-17 ENCOUNTER — Ambulatory Visit: Payer: Medicare HMO

## 2023-03-17 DIAGNOSIS — Z1231 Encounter for screening mammogram for malignant neoplasm of breast: Secondary | ICD-10-CM | POA: Diagnosis not present

## 2023-03-18 NOTE — Progress Notes (Unsigned)
No chief complaint on file.  Patient presents for 6 month follow-up on chronic problems.  Hypercholesterolemia:  She is compliant with taking Crestor 10mg  every other day (she had myalgias, "weakness" in her legs when it was taken daily, despite using coenzyme Q10; also previously didn't tolerate lipitor, achey in legs, "felt like she couldn't move").  TG were elevated at 240 on last check in 08/2022. She was having cookies with her coffee, and other sweets 2x/week.  She was having bacon and eggs once a week, uses 2% milk in her cereal, and red meat 2x/week or less. She avoids fried foods.   Since she didn't tolerate daily crestor, she was asked to cut back on the cookies and sweets, and to start taking 3000 mg of omega-3 fish oil daily.  Diet--changes? Fish oil?  Due for recheck today.  Lab Results  Component Value Date   CHOL 223 (H) 09/16/2022   HDL 53 09/16/2022   LDLCALC 128 (H) 09/16/2022   TRIG 240 (H) 09/16/2022   CHOLHDL 4.2 09/16/2022    Hypertension:  BP's are checked occasionally, and have been running  *** She is compliant with HCTZ 12.5 mg, denies side effects. She eats bananas in her diet. She denies muscle cramps. Denies headaches, dizziness, chest pain, shortness of breath or edema. She reports her monitor was verified as accurate in the past.    BP Readings from Last 3 Encounters:  01/23/23 134/72  01/16/23 (!) 157/87  01/14/23 (!) 152/76     Hypothyroidism:  She reports compliance with her medications.  Dose was increased from 75 to 88 mcg in January 2023--TSH was 5.380 and she reported fatigue.  Recheck of TSH in March was at goal, and last check in 08/2022 was okay.   She denies any changes in hair/skin/nails, bowels, moods. "I'm always dry"--skin, scalp, but this is no different for her. Energy remains improved.   Lab Results  Component Value Date   TSH 3.590 09/16/2022    GERD--She is under the care of Dr. Loreta Ave.  She had been on nightly Omeprazole 40mg   for a long time, and had been switched to famotidine 40mg  BID.  She stopped this, and has been doing well just using Tums as needed.  She admits to needing a Tums once daily, and it works "pretty good".  She limits spicy foods. She continues on probiotic daily, Align. Denies trouble swallowing; sometimes feels a burning in her upper chest, r/b Tums. UPDATE  She saw Dr. Loreta Ave in June with complaints of rectal bleeding, fecal incontinence. She was advised to avoid artifical sweeteners (contribute to diarrhea and fecal incontinence), high fiber diet, and reflux measures.  UPDATE   Low back pain:  Has been under the care of neurosurgeon for years, getting epidural injections.  She gets this q 3 months--she had in February, June and September of 2023. She wasn't sure that it made that much of a difference, so has opted not to continue getting these injections. UPDATE  Still has issues with pain into the buttock, but no longer has any pain into the leg. She has pain when she first wakes up in the morning, improves with stretching and walking around.  No longer hurts with prolonged standing.     H/o IFG:  As reported above, was having cookies daily with her coffee, and limited her desserts to 2x/week. Only an occasional sweet tea.  Last A1c was 5.7% in 08/2022.  UPDATE  History of right breast cancer: s/p lumpectomy and  radiation in 2018. Completed tamoxifen 05/2022. She saw the oncologist in late May due to cellulitis of R breast. She had imaging done at that time: IMPRESSION: 1. Erythema without new skin thickening or mass consistent with infection or inflammation. 2. Expected post treatment changes in the RIGHT breast. 3. There is no imaging evidence for malignancy. However, inflammatory or recurrent breast cancer are not excluded.   RECOMMENDATION: Recommend continued antibiotic treatment and clinical follow-up. If erythema fails to respond appropriately to antibiotics, consider punch biopsy  and MRI to exclude recurrent or inflammatory breast cancer. Otherwise, screening mammogram is recommended in July of 2024.  She was treated with 2 course of antibiotics (doxycycline and clindamycin), and was advised to f/u with Dr. Carolynne Edouard if it didn't completely resolve, for possible biopsy.  DID SHE SEE SURGEON?   She had routine mammogram on 7/29. Possible asymmetry with associated calcifications was noted in the R breast. She needs to schedule additional studies.       PMH, PSH, SH reviewed    ROS: no fever, chills, headaches, URI symptoms, dizziness, chest pain, shortness of breath. Denies edema. No urinary complaints. Hemorrhoids periodically flare (s/p banding, recurred).  Very rarely bleeds. Constipation is very rare. L buttock pain per HPI.  No numbness, tingling, weakness or radiation into leg.  Neck discomfort/tension is mild and chronic (left-sided).  Carpal tunnel syndrome is sporadic, on the left, mild/tolerable.   Minimal hot flashes since stopping Tamoxifen. Psoriasis is not currently flaring. R breast  ***   PHYSICAL EXAM:  There were no vitals taken for this visit.  Wt Readings from Last 3 Encounters:  01/23/23 167 lb 12.8 oz (76.1 kg)  01/14/23 170 lb 14.4 oz (77.5 kg)  01/08/23 170 lb 4.8 oz (77.2 kg)    Pleasant female in no distress HEENT: conjunctiva and sclera are clear, EOMI.   Neck: no lymphadenopathy, thyromegaly or mass Heart: regular rate and rhythm, no murmur Lungs: clear bilaterally.   Back: no spinal or CVA tenderness Abdomen: soft, nontender, no mass Extremities: no edema Neuro: alert and oriented, cranial nerves grossly intact, normal gait Psych: normal mood, affect, hygiene and grooming  R breast?? (Examine only if red/issues/concerns ***  ASSESSMENT/PLAN:   Add fish oil if she is taking  Did she sees surgeon (Dr. Carolynne Edouard)? For her R breast?  Did we get notes from Dr. Loreta Ave?? (In to be scanned pile, or not received?) Were any  labs done?  Did she ever get Tdap from pharmacy?  Past due Did she get shingrix? Or RSV?   F/u for CPE/AWV as scheduled 09/2023.   Tetanus booster is past due, please get TdaP from pharmacy.  Shingrix (shingles vaccine) is recommended. Please get this from the pharmacy (series of 2 shots, separated by 2 months, as we discussed at your physical).  RSV vaccine is recommended. You should get this from the pharmacy in the Fall. Yearly high dose flu shots are recommended every Fall. I encourage you to get the updated COVID booster when it becomes available in the Fall. Continue to be careful--there is an uptick of COVID cases in our area currently.   RSV vaccine discussed, to get from pharmacy if interested in 2 weeks, vs in the Fall.

## 2023-03-19 ENCOUNTER — Encounter: Payer: Self-pay | Admitting: Family Medicine

## 2023-03-19 ENCOUNTER — Ambulatory Visit (INDEPENDENT_AMBULATORY_CARE_PROVIDER_SITE_OTHER): Payer: Medicare HMO | Admitting: Family Medicine

## 2023-03-19 ENCOUNTER — Other Ambulatory Visit: Payer: Self-pay | Admitting: Hematology and Oncology

## 2023-03-19 VITALS — BP 130/80 | HR 60 | Ht 63.0 in | Wt 171.4 lb

## 2023-03-19 DIAGNOSIS — M5442 Lumbago with sciatica, left side: Secondary | ICD-10-CM

## 2023-03-19 DIAGNOSIS — I10 Essential (primary) hypertension: Secondary | ICD-10-CM | POA: Diagnosis not present

## 2023-03-19 DIAGNOSIS — E039 Hypothyroidism, unspecified: Secondary | ICD-10-CM

## 2023-03-19 DIAGNOSIS — R7301 Impaired fasting glucose: Secondary | ICD-10-CM

## 2023-03-19 DIAGNOSIS — Z7185 Encounter for immunization safety counseling: Secondary | ICD-10-CM

## 2023-03-19 DIAGNOSIS — G8929 Other chronic pain: Secondary | ICD-10-CM

## 2023-03-19 DIAGNOSIS — K219 Gastro-esophageal reflux disease without esophagitis: Secondary | ICD-10-CM

## 2023-03-19 DIAGNOSIS — Z853 Personal history of malignant neoplasm of breast: Secondary | ICD-10-CM | POA: Insufficient documentation

## 2023-03-19 DIAGNOSIS — E782 Mixed hyperlipidemia: Secondary | ICD-10-CM

## 2023-03-19 DIAGNOSIS — R928 Other abnormal and inconclusive findings on diagnostic imaging of breast: Secondary | ICD-10-CM

## 2023-03-19 LAB — POCT GLYCOSYLATED HEMOGLOBIN (HGB A1C): Hemoglobin A1C: 5.7 % — AB (ref 4.0–5.6)

## 2023-03-19 NOTE — Assessment & Plan Note (Signed)
TG 240 on last check. She has started taking fish oil and cut out some sweets from her diet. Recheck today. To verify the dose of fish oil (per serving vs per capsule).  Has some room to improve further--cutting out sweet tea, regular soda

## 2023-03-19 NOTE — Assessment & Plan Note (Signed)
Dr. Loreta Ave put her back on 40mg  famotidine BID. Denies heartburn. Reviewed diet.  She is due to f/u with her again soon. Can discuss using lowest effective dose (but no harm if she stays at current dose)

## 2023-03-19 NOTE — Patient Instructions (Addendum)
Double check your fish oil to see how many capsules are "per serving" for 1400 mg.  Please do not have any sweet tea or lemonade.  These have a lot of sugar.  Tetanus booster is past due, please get TdaP from pharmacy.  Shingrix (shingles vaccine) is recommended. Please get this from the pharmacy (series of 2 shots, separated by 2 months, as we discussed at your physical).  RSV vaccine is recommended. You should get this from the pharmacy in the Fall. Yearly high dose flu shots are recommended every Fall. I encourage you to get the updated COVID booster when it becomes available in the Fall. Continue to be careful--there is an uptick of COVID cases in our area currently.

## 2023-03-19 NOTE — Assessment & Plan Note (Signed)
Clinically euthyroid, last TSH fine. Continue current dose

## 2023-03-19 NOTE — Assessment & Plan Note (Signed)
Doesn't think it is bad enough for another injection.  Refer for PT. Discussed importance of doing HEP regularly, longterm

## 2023-03-19 NOTE — Assessment & Plan Note (Signed)
Controlled. Continue current meds, low sodium diet. Daily exercise and weight loss encouraged

## 2023-03-19 NOTE — Assessment & Plan Note (Signed)
Recent cellulitis, seems to have resolved. She just had abnormal R mammo; advised to contact them to schedule additional views (poss asymmetry, calcification).  Minimal erythema today, Ddx discussed, which includes cancer, local lymphedema.  Given fluctuation, and very mild, do not suspect any ongoing infection.

## 2023-03-19 NOTE — Assessment & Plan Note (Signed)
Diet improved. Still having some sweet tea and regular soda, encouraged to cut back.  Congratulated on cutting out cookies with her morning coffee. Daily exercise and weight loss encouraged

## 2023-03-20 ENCOUNTER — Telehealth: Payer: Self-pay | Admitting: *Deleted

## 2023-03-20 DIAGNOSIS — E782 Mixed hyperlipidemia: Secondary | ICD-10-CM

## 2023-03-20 MED ORDER — ROSUVASTATIN CALCIUM 10 MG PO TABS: ORAL_TABLET | ORAL | 0 refills | Status: DC

## 2023-03-20 NOTE — Telephone Encounter (Signed)
I think still just the #45--it won't last 3 months, but we can see how she ends up taking it, and then adjust the quantity once we know.   Just lipids

## 2023-03-20 NOTE — Telephone Encounter (Signed)
Spoke with patient and 2 fish oil capsules equate to 1400mg  (1000mg  omega 3) she will double up and she will continue the crestor 10mg  every other day and double up one day a week. She will try to increase if she can. Scheduled for 06/20/23 for fasting labs. Just lipid? Also she needs a refill on the 10mg  crestor, how many should I send since she wants to try to increase a bit? Unsure of how to do this. Thanks.

## 2023-03-26 ENCOUNTER — Ambulatory Visit
Admission: RE | Admit: 2023-03-26 | Discharge: 2023-03-26 | Disposition: A | Discharge status: Home / Self Care | Payer: Medicare HMO | Source: Ambulatory Visit | Attending: Hematology and Oncology | Admitting: Hematology and Oncology

## 2023-03-26 ENCOUNTER — Other Ambulatory Visit: Payer: Self-pay | Admitting: Hematology and Oncology

## 2023-03-26 DIAGNOSIS — N6311 Unspecified lump in the right breast, upper outer quadrant: Secondary | ICD-10-CM | POA: Diagnosis not present

## 2023-03-26 DIAGNOSIS — R928 Other abnormal and inconclusive findings on diagnostic imaging of breast: Secondary | ICD-10-CM

## 2023-03-26 DIAGNOSIS — N6310 Unspecified lump in the right breast, unspecified quadrant: Secondary | ICD-10-CM

## 2023-03-31 ENCOUNTER — Ambulatory Visit
Admission: RE | Admit: 2023-03-31 | Discharge: 2023-03-31 | Disposition: A | Discharge status: Home / Self Care | Payer: Medicare HMO | Source: Ambulatory Visit | Attending: Hematology and Oncology | Admitting: Hematology and Oncology

## 2023-03-31 DIAGNOSIS — N6310 Unspecified lump in the right breast, unspecified quadrant: Secondary | ICD-10-CM

## 2023-03-31 DIAGNOSIS — N6311 Unspecified lump in the right breast, upper outer quadrant: Secondary | ICD-10-CM | POA: Diagnosis not present

## 2023-03-31 HISTORY — PX: BREAST BIOPSY: SHX20

## 2023-04-17 ENCOUNTER — Ambulatory Visit
Admission: EM | Admit: 2023-04-17 | Discharge: 2023-04-17 | Disposition: A | Discharge status: Home / Self Care | Payer: Medicare HMO | Attending: Family Medicine | Admitting: Family Medicine

## 2023-04-17 DIAGNOSIS — Z1152 Encounter for screening for COVID-19: Secondary | ICD-10-CM | POA: Diagnosis not present

## 2023-04-17 DIAGNOSIS — J069 Acute upper respiratory infection, unspecified: Secondary | ICD-10-CM | POA: Insufficient documentation

## 2023-04-17 MED ORDER — FLUTICASONE PROPIONATE 50 MCG/ACT NA SUSP: 1.0000 | Freq: Two times a day (BID) | NASAL | 2 refills | Status: DC

## 2023-04-17 MED ORDER — PROMETHAZINE-DM 6.25-15 MG/5ML PO SYRP: 5.0000 mL | ORAL_SOLUTION | Freq: Four times a day (QID) | ORAL | 0 refills | Status: DC | PRN

## 2023-04-17 NOTE — ED Triage Notes (Signed)
Pt reports cheek pain, teeth pain, headache, and cough x 2 days

## 2023-04-17 NOTE — ED Provider Notes (Signed)
[facility]    CSN: [id number] Arrival date & time: 04/17/23  1229      History   Chief Complaint No chief complaint on file.   HPI Mckenzie Webb is a 77 y.o. female.   Patient presenting today with 2-day history of sinus pain and pressure, headache, fatigue, cough, chest congestion, scratchy throat.  Denies fever, body aches, chest pain, shortness of breath, abdominal pain, nausea vomiting or diarrhea.  Trying Mucinex and Tylenol with minimal relief.  No known sick contacts recently.    Past Medical History:  Diagnosis Date   Arthritis    lower back   Cough 07/22/2017   Dental crowns present    Family history of adverse reaction to anesthesia    pt's sister has hx. of post-op N/V   GERD (gastroesophageal reflux disease)    History of chemotherapy    finished chemo 06/30/2017   History of radiation therapy 07/29/17- 08/28/17   Right Breast and Axilla treated to 42.56 Gy with 16 fx of 2.66. Boost of 8 Gy with 4 fx of 2 Gy.    History of right breast cancer 02/2017   Hyperlipidemia    Hypothyroid    Leukopenia 07/07/2017   Personal history of chemotherapy    Personal history of radiation therapy    Stuffy and runny nose 07/22/2017   clear drainage from nose, per pt.    Patient Active Problem List   Diagnosis Date Noted   Personal history of breast cancer 03/19/2023   Chronic midline low back pain with left-sided sciatica 03/19/2023   Mixed hyperlipidemia 09/17/2022   Senile purpura (HCC) 03/10/2020   Plant dermatitis 12/13/2019   Hot flashes due to tamoxifen 09/06/2019   Morbid exogenous obesity (HCC) 09/28/2017   Port catheter in place 04/28/2017   Genetic testing 04/17/2017   Family history of pancreatic cancer    Family history of colon cancer    Malignant neoplasm of upper-inner quadrant of right breast in female, estrogen receptor positive (HCC) 03/11/2017   Essential hypertension, benign 07/31/2015   IFG (impaired fasting glucose)  05/03/2015   Psoriasis 05/03/2014   Greater trochanteric bursitis of right hip 03/14/2014   Obesity (BMI 30-39.9) 05/26/2013   GERD (gastroesophageal reflux disease) 12/31/2010   Hypothyroidism 12/31/2010   Vitamin D deficiency 12/31/2010   Pure hypercholesterolemia 12/31/2010    Past Surgical History:  Procedure Laterality Date   BREAST BIOPSY Right 03/31/2023   [place] RT BREAST BX W LOC DEV 1ST LESION IMG BX SPEC US GUIDE 03/31/2023 [facility]   BREAST LUMPECTOMY Right 03/26/2017   BREAST LUMPECTOMY WITH RADIOACTIVE SEED AND SENTINEL LYMPH NODE BIOPSY Right 03/26/2017   Procedure: BREAST LUMPECTOMY WITH RADIOACTIVE SEED AND SENTINEL LYMPH NODE BIOPSY;  Surgeon: Griselda Miner, MD;  Location: [facility];  Service: General;  Laterality: Right;   CARPAL TUNNEL RELEASE Right 01/05/2021   R CTR with RMF trigger release (Dr. Amanda Pea)   CATARACT EXTRACTION Left 2021   CHOLECYSTECTOMY     COLONOSCOPY  10/2007   CYST EXCISION  02/01/2003   knee   KNEE ARTHROSCOPY Right 1990   KNEE ARTHROSCOPY Left 12/12/2010   PORT-A-CATH REMOVAL N/A 09/03/2017   Procedure: REMOVAL PORT-A-CATH;  Surgeon: Griselda Miner, MD;  Location: [facility];  Service: General;  Laterality: N/A;   PORTACATH PLACEMENT Left 04/24/2017   Procedure: INSERTION PORT-A-CATH;  Surgeon: Griselda Miner, MD;  Location: [facility];  Service: General;  Laterality: Left;   TUBAL  LIGATION  1983   VARICOSE VEIN SURGERY  09/2009 R, 06/2010 L    OB History     Gravida  2   Para  2   Term      Preterm      AB      Living  2      SAB      IAB      Ectopic      Multiple      Live Births               Home Medications    Prior to Admission medications   Medication Sig Start Date End Date Taking? Authorizing Provider  fluticasone (FLONASE) 50 MCG/ACT nasal spray Place 1 spray into both nostrils 2 (two) times daily. 04/17/23  Yes Particia Nearing, PA-C   promethazine-dextromethorphan (PROMETHAZINE-DM) 6.25-15 MG/5ML syrup Take 5 mLs by mouth 4 (four) times daily as needed. 04/17/23  Yes Particia Nearing, PA-C  acetaminophen (TYLENOL) 500 MG tablet Take 1,000 mg by mouth every 6 (six) hours as needed. Patient not taking: Reported on 09/16/2022    [provider]  calcium-vitamin D (OSCAL WITH D) 500-200 MG-UNIT tablet Take 1 tablet by mouth daily.    [provider]  clobetasol cream (TEMOVATE) 0.05 % Apply topically 2 (two) times daily as needed. Patient not taking: Reported on 09/16/2022 04/27/20   Joselyn Arrow, MD  Coenzyme Q10 (CO Q-10) 100 MG CAPS Take by mouth daily.    [provider]  famotidine (PEPCID) 40 MG tablet Take 40 mg by mouth 2 (two) times daily. 02/04/23   [provider]  hydrochlorothiazide (MICROZIDE) 12.5 MG capsule TAKE 1 CAPSULE BY MOUTH EVERY DAY 01/22/23   Joselyn Arrow, MD  levothyroxine (SYNTHROID) 88 MCG tablet Take 1 tablet (88 mcg total) by mouth daily. 09/17/22   Joselyn Arrow, MD  loratadine (CLARITIN) 10 MG tablet Take 10 mg by mouth daily as needed for allergies.    [provider]  Multiple Vitamins-Minerals (ALIVE WOMENS 50+ PO) Take 1 tablet by mouth daily.     [provider]  Multiple Vitamins-Minerals (PRESERVISION AREDS 2 PO) Take 1 tablet by mouth in the morning and at bedtime.    [provider]  Probiotic Product (ALIGN PO) Take 1 capsule by mouth daily.    [provider]  rosuvastatin (CRESTOR) 10 MG tablet Take 1 tablet by mouth every other day 03/20/23   Joselyn Arrow, MD    Family History Family History  Problem Relation Age of Onset   Hypertension Mother    Heart disease Mother    Gallbladder disease Mother    Thyroid disease Mother    Arthritis Mother    Diabetes Father    Cancer Father        pancreatic   Hypertension Sister    Gallbladder disease Sister    Diabetes Sister    Diverticulitis Sister        of small intestine    COPD Sister    Stroke Sister        late 50's   Hypertension Sister    Pulmonary embolism Sister    Ulcerative colitis Sister    COPD Brother        had lung lobe removed for suspected cancer, but was benign; smoker   AAA (abdominal aortic aneurysm) Brother    Emphysema Brother    Diabetes Son        great toes amputated at 25  Cancer Son        testicular cancer   Lung cancer Cousin        maternal first cousin - smoker   Colon cancer Other 28    Social History Social History   Tobacco Use   Smoking status: Never   Smokeless tobacco: Never  Vaping Use   Vaping status: Never Used  Substance Use Topics   Alcohol use: Not Currently    Comment: none since her cancer diagnosis 2018; rare margarita   Drug use: No     Allergies   Adhesive [tape], Ciprofloxacin, Codeine, Iodine, Polysporin [bacitracin-polymyxin b], Penicillins, and Sulfa antibiotics   Review of Systems Review of Systems PER HPI  Physical Exam Triage Vital Signs ED Triage Vitals  Encounter Vitals Group     BP 04/17/23 1300 138/83     Systolic BP Percentile --      Diastolic BP Percentile --      Pulse Rate 04/17/23 1300 78     Resp 04/17/23 1300 20     Temp 04/17/23 1300 98.3 F (36.8 C)     Temp Source 04/17/23 1300 Oral     SpO2 04/17/23 1300 96 %     Weight --      Height --      Head Circumference --      Peak Flow --      Pain Score 04/17/23 1259 7     Pain Loc --      Pain Education --      Exclude from Growth Chart --    No data found.  Updated Vital Signs BP 138/83 (BP Location: Right Arm)   Pulse 78   Temp 98.3 F (36.8 C) (Oral)   Resp 20   SpO2 96%   Visual Acuity Right Eye Distance:   Left Eye Distance:   Bilateral Distance:    Right Eye Near:   Left Eye Near:    Bilateral Near:     Physical Exam Vitals and nursing note reviewed.  Constitutional:      Appearance: Normal appearance. She is not ill-appearing.  HENT:     Head: Atraumatic.     Right Ear:  Tympanic membrane and external ear normal.     Left Ear: Tympanic membrane and external ear normal.     Nose: Rhinorrhea present.     Mouth/Throat:     Mouth: Mucous membranes are moist.     Pharynx: Posterior oropharyngeal erythema present.  Eyes:     Extraocular Movements: Extraocular movements intact.     Conjunctiva/sclera: Conjunctivae normal.  Cardiovascular:     Rate and Rhythm: Normal rate and regular rhythm.     Heart sounds: Normal heart sounds.  Pulmonary:     Effort: Pulmonary effort is normal.     Breath sounds: Normal breath sounds. No wheezing or rales.  Musculoskeletal:        General: Normal range of motion.     Cervical back: Normal range of motion and neck supple.  Skin:    General: Skin is warm and dry.  Neurological:     Mental Status: She is alert and oriented to person, place, and time.  Psychiatric:        Mood and Affect: Mood normal.        Thought Content: Thought content normal.        Judgment: Judgment normal.      UC Treatments / Results  Labs (all labs ordered are listed, but only  abnormal results are displayed) Labs Reviewed  SARS CORONAVIRUS 2 (TAT 6-24 HRS)    EKG   Radiology No results found.  Procedures Procedures (including critical care time)  Medications Ordered in UC Medications - No data to display  Initial Impression / Assessment and Plan / UC Course  I have reviewed the triage vital signs and the nursing notes.  Pertinent labs & imaging results that were available during my care of the patient were reviewed by me and considered in my medical decision making (see chart for details).     Suspect viral respiratory infection, vitals and exam overall reassuring today, COVID testing pending.  Good candidate for molnupiravir if positive.  Treat in the meantime with Phenergan DM, Flonase, supportive over-the-counter medications and home care.  Return for worsening symptoms.  Final Clinical Impressions(s) / UC Diagnoses    Final diagnoses:  Viral URI with cough   Discharge Instructions   None    ED Prescriptions     Medication Sig Dispense Auth. Provider   promethazine-dextromethorphan (PROMETHAZINE-DM) 6.25-15 MG/5ML syrup Take 5 mLs by mouth 4 (four) times daily as needed. 100 mL Particia Nearing, PA-C   fluticasone [facility]) 50 MCG/ACT nasal spray Place 1 spray into both nostrils 2 (two) times daily. 16 g Particia Nearing, [place]      PDMP not reviewed this encounter.   Particia Nearing, [place] 04/17/23 1329

## 2023-04-18 ENCOUNTER — Telehealth: Payer: Self-pay

## 2023-04-18 ENCOUNTER — Ambulatory Visit (HOSPITAL_COMMUNITY): Payer: Medicare HMO

## 2023-04-18 LAB — SARS CORONAVIRUS 2 (TAT 6-24 HRS): SARS Coronavirus 2: NEGATIVE

## 2023-04-18 NOTE — Telephone Encounter (Signed)
Pt called about her negative covid test, she was asking about getting antibiotics.  S/w Lorene Dy NP states she would like the patient to continue using the medication prescribed. Pt verbalize understanding.

## 2023-04-19 ENCOUNTER — Other Ambulatory Visit: Payer: Self-pay | Admitting: Family Medicine

## 2023-04-19 DIAGNOSIS — I10 Essential (primary) hypertension: Secondary | ICD-10-CM

## 2023-04-28 ENCOUNTER — Encounter: Payer: Self-pay | Admitting: Family Medicine

## 2023-04-28 ENCOUNTER — Other Ambulatory Visit: Payer: Self-pay | Admitting: Family Medicine

## 2023-04-29 NOTE — Progress Notes (Unsigned)
No chief complaint on file.  She was seen in Urgent Care on 8/29 with 2-day history of sinus pain/pressure, headache, fatigue, cough, chest congestion, scratchy throat.  She was diagnosed with a viral URI (COVID test was negative). She was prescribed promethazine-dextromethorphan and Flonase. She has also been taking Mucinex    PMH, PSH, SH reviewed    ROS:   PHYSICAL EXAM:  There were no vitals taken for this visit.    ASSESSMENT/PLAN:

## 2023-04-30 ENCOUNTER — Ambulatory Visit (INDEPENDENT_AMBULATORY_CARE_PROVIDER_SITE_OTHER): Payer: Medicare HMO | Admitting: Family Medicine

## 2023-04-30 ENCOUNTER — Encounter: Payer: Self-pay | Admitting: Family Medicine

## 2023-04-30 VITALS — BP 130/80 | HR 60 | Temp 98.1°F | Ht 64.5 in | Wt 171.2 lb

## 2023-04-30 DIAGNOSIS — J40 Bronchitis, not specified as acute or chronic: Secondary | ICD-10-CM

## 2023-04-30 DIAGNOSIS — R059 Cough, unspecified: Secondary | ICD-10-CM

## 2023-04-30 DIAGNOSIS — J329 Chronic sinusitis, unspecified: Secondary | ICD-10-CM | POA: Diagnosis not present

## 2023-04-30 MED ORDER — HYDROCODONE BIT-HOMATROP MBR 5-1.5 MG/5ML PO SOLN: 5.0000 mL | Freq: Three times a day (TID) | ORAL | 0 refills | Status: DC | PRN

## 2023-04-30 MED ORDER — AZITHROMYCIN 250 MG PO TABS: ORAL_TABLET | ORAL | 0 refills | Status: DC

## 2023-04-30 NOTE — Patient Instructions (Signed)
  Drink plenty of water. Continue Mucinex (plain or DM), twice daily. Try the benzonatate during the day, and use the hydrocodone cough syrup only at bedtime. Take the antibiotics as directed. Consider sinus rinses (such as sinus rinse kit or neti-pot) if you have ongoing postnasal drainage and/or sinus pain/pressure. If you have persistent cough, we may need to use a course of steroids.

## 2023-05-01 ENCOUNTER — Ambulatory Visit (HOSPITAL_COMMUNITY): Payer: Medicare HMO | Admitting: Physical Therapy

## 2023-05-05 DIAGNOSIS — Z6827 Body mass index (BMI) 27.0-27.9, adult: Secondary | ICD-10-CM | POA: Diagnosis not present

## 2023-05-05 DIAGNOSIS — Z01419 Encounter for gynecological examination (general) (routine) without abnormal findings: Secondary | ICD-10-CM | POA: Diagnosis not present

## 2023-05-06 NOTE — Therapy (Unsigned)
OUTPATIENT PHYSICAL THERAPY THORACOLUMBAR EVALUATION   Patient Name: Mckenzie Webb MRN: [id number] DOB:11-Oct-1945, 77 y.o., female Today's Date: 05/07/2023  END OF SESSION:  PT End of Session - 05/07/23 1604     Visit Number 1   Number of Visits 12    Date for PT Re-Evaluation 06/18/23    Authorization Type [employer] medicare    Progress Note Due on Visit 10    PT Start Time 1515    PT Stop Time 1600    PT Time Calculation (min) 45 min    Activity Tolerance Patient tolerated treatment well    Behavior During Therapy [facility] for tasks assessed/performed             Past Medical History:  Diagnosis Date   Arthritis    lower back   Cough 07/22/2017   Dental crowns present    Family history of adverse reaction to anesthesia    pt's sister has hx. of post-op N/V   GERD (gastroesophageal reflux disease)    History of chemotherapy    finished chemo 06/30/2017   History of radiation therapy 07/29/17- 08/28/17   Right Breast and Axilla treated to 42.56 Gy with 16 fx of 2.66. Boost of 8 Gy with 4 fx of 2 Gy.    History of right breast cancer 02/2017   Hyperlipidemia    Hypothyroid    Leukopenia 07/07/2017   Personal history of chemotherapy    Personal history of radiation therapy    Stuffy and runny nose 07/22/2017   clear drainage from nose, per pt.   Past Surgical History:  Procedure Laterality Date   BREAST BIOPSY Right 03/31/2023   [place] RT BREAST BX W LOC DEV 1ST LESION IMG BX SPEC US GUIDE 03/31/2023 [facility]   BREAST LUMPECTOMY Right 03/26/2017   BREAST LUMPECTOMY WITH RADIOACTIVE SEED AND SENTINEL LYMPH NODE BIOPSY Right 03/26/2017   Procedure: BREAST LUMPECTOMY WITH RADIOACTIVE SEED AND SENTINEL LYMPH NODE BIOPSY;  Surgeon: Griselda Miner, MD;  Location: [facility];  Service: General;  Laterality: Right;   CARPAL TUNNEL RELEASE Right 01/05/2021   R CTR with RMF trigger release (Dr. Amanda Pea)   CATARACT EXTRACTION Left 2021   CHOLECYSTECTOMY      COLONOSCOPY  10/2007   CYST EXCISION  02/01/2003   knee   KNEE ARTHROSCOPY Right 1990   KNEE ARTHROSCOPY Left 12/12/2010   PORT-A-CATH REMOVAL N/A 09/03/2017   Procedure: REMOVAL PORT-A-CATH;  Surgeon: Griselda Miner, MD;  Location: [facility];  Service: General;  Laterality: N/A;   PORTACATH PLACEMENT Left 04/24/2017   Procedure: INSERTION PORT-A-CATH;  Surgeon: Griselda Miner, MD;  Location: [facility];  Service: General;  Laterality: Left;   TUBAL LIGATION  1983   VARICOSE VEIN SURGERY  09/2009 R, 06/2010 L   Patient Active Problem List   Diagnosis Date Noted   Personal history of breast cancer 03/19/2023   Chronic midline low back pain with left-sided sciatica 03/19/2023   Mixed hyperlipidemia 09/17/2022   Senile purpura (HCC) 03/10/2020   Plant dermatitis 12/13/2019   Hot flashes due to tamoxifen 09/06/2019   Morbid exogenous obesity (HCC) 09/28/2017   Port catheter in place 04/28/2017   Genetic testing 04/17/2017   Family history of pancreatic cancer    Family history of colon cancer    Malignant neoplasm of upper-inner quadrant of right breast in female, estrogen receptor positive (HCC) 03/11/2017   Essential hypertension, benign 07/31/2015   IFG (impaired fasting glucose) 05/03/2015  Psoriasis 05/03/2014   Greater trochanteric bursitis of right hip 03/14/2014   Obesity (BMI 30-39.9) 05/26/2013   GERD (gastroesophageal reflux disease) 12/31/2010   Hypothyroidism 12/31/2010   Vitamin D deficiency 12/31/2010   Pure hypercholesterolemia 12/31/2010    PCP: Joselyn Arrow, MD  REFERRING PROVIDER: Joselyn Arrow, MD  REFERRING DIAG: [phone] (ICD-10-CM) - Chronic midline low back pain with left-sided sciatica  Rationale for Evaluation and Treatment: Rehabilitation  THERAPY DIAG:  Radiculopathy, lumbar region  Muscle weakness (generalized)  ONSET DATE: lumbar radiculopathy                                                                                                                                                                                          SUBJECTIVE STATEMENT: Mckenzie Webb states that she has been having back pain for about five years.  She is the worst in the mornings.  Stretching in the mornings helps.  She also completes a piriformis stretch which helps.  She has pain that goes down to her Lt buttock but not below.  She states that the buttock pain is pretty constant.   Standing 30 minutes then she wants to sit down,  Walking 25 minutes.  She takes several breaks if she is completing a lot of her housework.  She can no longer complete all of her housework in one go.   PERTINENT HISTORY:  Se above   PAIN:  Are you having pain? Yes: NPRS scale: 7/10; worst 7/10; 1-2/10 Pain location: low back Pain description: aches Aggravating factors: weight bearing  Relieving factors: extension   PRECAUTIONS: None    WEIGHT BEARING RESTRICTIONS: No  FALLS:  Has patient fallen in last 6 months? No  LIVING ENVIRONMENT: Lives with: lives with their spouse  OCCUPATION: retired  PLOF: Independent  PATIENT GOALS: less pain , to be able to do more without having to sit down.   NEXT MD VISIT: November   OBJECTIVE:    PATIENT SURVEYS:  FOTO 53  COGNITION: Overall cognitive status: Within functional limits for tasks assessed      POSTURE: rounded shoulders, forward head, decreased lumbar lordosis, and decreased  thoracic kyphosis   LUMBAR ROM:   AROM eval  Flexion Fingers to toes going up increases pain.    Extension 30; reps no change  Right lateral flexion   Left lateral flexion   Right rotation   Left rotation    (Blank rows = not tested)  LOWER EXTREMITY ROM:   WFL   LOWER EXTREMITY MMT:    MMT Right eval Left eval  Hip flexion 4+ 4+  Hip extension 3+ 3  Hip abduction 4  4-  Hip adduction    Hip internal rotation    Hip external rotation    Knee flexion 4- 3+  Knee extension 5 5  Ankle dorsiflexion 4-  3+  Ankle plantarflexion    Ankle inversion    Ankle eversion     (Blank rows = not tested)  FUNCTIONAL TESTS:  30 seconds chair stand test: 14 ; 14 is good for pt age and sex  2 minute walk test: 464 ft with increased Lt buttock pain and slight limp.  Single leg stance RT: 17"  , LT:  38"  TODAY'S TREATMENT:                                                                                                                              DATE: 05/07/23:  Evaluation  Sitting: Good posture x 5             Ab set x 5             Scapular retraction x 5             Prone: press up x 5             Quadriped piriformis stretch x 2 hold for 30 seconds     PATIENT EDUCATION:  Education details: evaluation  Person educated: Patient Education method: Programmer, multimedia, Verbal cues, and Handouts Education comprehension: returned demonstration  HOME EXERCISE PROGRAM: Access Code: [email or web address] URL: [email or web address] Date: 05/07/2023 Prepared by: Virgina Organ  Exercises - Seated Upright Posture Correction  - 3 x daily - 7 x weekly - 1 sets - 10 reps - 5 seconds  hold - Seated Scapular Retraction  - 2 x daily - 7 x weekly - 1 sets - 10 reps - 5" hold - Seated Transversus Abdominis Bracing  - 2 x daily - 7 x weekly - 1 sets - 10 reps - 5" hold - Prone Press Up  - 2 x daily - 7 x weekly - 1 sets - 5 reps - 3-5" hold - Quadruped Piriformis Stretch  - 2 x daily - 7 x weekly - 1 sets - 3 reps - 30"  hold  ASSESSMENT:  CLINICAL IMPRESSION: Patient is a 77 y.o. female who was seen today for physical therapy evaluation and treatment for lumbar radiculopathy.  Ms. Cockman demonstrates decreased strength, decreased balance, decreased activity tolerance and increased pain.  Ms. Kichline will benefit from skilled PT to address these issues and maximize her functional level. .   OBJECTIVE IMPAIRMENTS: decreased activity tolerance, decreased balance, difficulty walking, decreased strength, increased  fascial restrictions, postural dysfunction, and pain.   ACTIVITY LIMITATIONS: lifting, bending, standing, and locomotion level  PARTICIPATION LIMITATIONS: meal prep, cleaning, shopping, and community activity  PERSONAL FACTORS: Time since onset of injury/illness/exacerbation are also affecting patient's functional outcome.   REHAB POTENTIAL: Good  CLINICAL DECISION MAKING: Stable/uncomplicated  EVALUATION COMPLEXITY: Low   GOALS: Goals reviewed with patient? No  SHORT TERM GOALS: Target date: 05/28/2023  Pt to be I in HEP to decrease her back pain to a max of 5/10 throughout the day Baseline: Goal status: INITIAL  2.  PT LE strength to be increased by 1/2 grade to allow pt to be able to walk for 30 minutes without increased pain.  Baseline:  Goal status: INITIAL  LONG TERM GOALS: Target date: 06/18/23  Pt to be I in an advanced HEP to decrease her back pain to a max of 3/10 throughout the day Baseline:  Goal status: INITIAL  2.   PT LE strength to be increased by 2 grade to allow pt to be able to walk for 60 minutes without increased pain. Baseline:  Goal status: INITIAL  3.  PT to be able to complete 2 hours of house work without having to sit and rest.   Baseline:  Goal status: INITIAL    PLAN:  PT FREQUENCY: 2x/week  PT DURATION: 6 weeks  PLANNED INTERVENTIONS: Therapeutic exercises, Therapeutic activity, Neuromuscular re-education, Balance training, Gait training, Patient/Family education, Self Care, and Manual therapy. PLAN FOR NEXT SESSION: begin hamstring stretch and knee to chest stretch, lumbar stabilization program.   Virgina Organ, PT CLT [phone]   05/07/2023, 4:17 PM

## 2023-05-07 ENCOUNTER — Other Ambulatory Visit: Payer: Self-pay

## 2023-05-07 ENCOUNTER — Ambulatory Visit (HOSPITAL_COMMUNITY): Payer: Medicare HMO | Attending: Family Medicine | Admitting: Physical Therapy

## 2023-05-07 DIAGNOSIS — G8929 Other chronic pain: Secondary | ICD-10-CM | POA: Diagnosis not present

## 2023-05-07 DIAGNOSIS — M5442 Lumbago with sciatica, left side: Secondary | ICD-10-CM | POA: Diagnosis not present

## 2023-05-07 DIAGNOSIS — M5416 Radiculopathy, lumbar region: Secondary | ICD-10-CM

## 2023-05-07 DIAGNOSIS — M6281 Muscle weakness (generalized): Secondary | ICD-10-CM

## 2023-05-13 ENCOUNTER — Encounter: Payer: Self-pay | Admitting: Family Medicine

## 2023-05-13 MED ORDER — PREDNISONE 20 MG PO TABS: 20.0000 mg | ORAL_TABLET | Freq: Two times a day (BID) | ORAL | 0 refills | Status: AC

## 2023-05-19 ENCOUNTER — Ambulatory Visit (HOSPITAL_COMMUNITY): Payer: Medicare HMO | Admitting: Physical Therapy

## 2023-05-19 DIAGNOSIS — M6281 Muscle weakness (generalized): Secondary | ICD-10-CM

## 2023-05-19 DIAGNOSIS — M5416 Radiculopathy, lumbar region: Secondary | ICD-10-CM | POA: Diagnosis not present

## 2023-05-19 DIAGNOSIS — M5442 Lumbago with sciatica, left side: Secondary | ICD-10-CM | POA: Diagnosis not present

## 2023-05-19 DIAGNOSIS — G8929 Other chronic pain: Secondary | ICD-10-CM | POA: Diagnosis not present

## 2023-05-19 NOTE — Therapy (Signed)
OUTPATIENT PHYSICAL THERAPY TREATMENT   Patient Name: Mckenzie Webb MRN: [id number] DOB:[birth year], 77 y.o., female Today's Date: 05/19/2023  END OF SESSION:   PT End of Session - 05/19/23 1525     Visit Number 2    Number of Visits 12    Date for PT Re-Evaluation 06/18/23    Authorization Type [employer] medicare    Progress Note Due on Visit 10    PT Start Time 1520    PT Stop Time 1600    PT Time Calculation (min) 40 min    Activity Tolerance Patient tolerated treatment well    Behavior During Therapy [facility] for tasks assessed/performed                Past Medical History:  Diagnosis Date   Arthritis    lower back   Cough 07/22/2017   Dental crowns present    Family history of adverse reaction to anesthesia    pt's sister has hx. of post-op N/V   GERD (gastroesophageal reflux disease)    History of chemotherapy    finished chemo 06/30/2017   History of radiation therapy 07/29/17- 08/28/17   Right Breast and Axilla treated to 42.56 Gy with 16 fx of 2.66. Boost of 8 Gy with 4 fx of 2 Gy.    History of right breast cancer 02/2017   Hyperlipidemia    Hypothyroid    Leukopenia 07/07/2017   Personal history of chemotherapy    Personal history of radiation therapy    Stuffy and runny nose 07/22/2017   clear drainage from nose, per pt.   Past Surgical History:  Procedure Laterality Date   BREAST BIOPSY Right 03/31/2023   [place] RT BREAST BX W LOC DEV 1ST LESION IMG BX SPEC US GUIDE 03/31/2023 [facility]   BREAST LUMPECTOMY Right 03/26/2017   BREAST LUMPECTOMY WITH RADIOACTIVE SEED AND SENTINEL LYMPH NODE BIOPSY Right 03/26/2017   Procedure: BREAST LUMPECTOMY WITH RADIOACTIVE SEED AND SENTINEL LYMPH NODE BIOPSY;  Surgeon: Griselda Miner, MD;  Location: [facility];  Service: General;  Laterality: Right;   CARPAL TUNNEL RELEASE Right 01/05/2021   R CTR with RMF trigger release (Dr. Amanda Pea)   CATARACT EXTRACTION Left 2021   CHOLECYSTECTOMY      COLONOSCOPY  10/2007   CYST EXCISION  02/01/2003   knee   KNEE ARTHROSCOPY Right 1990   KNEE ARTHROSCOPY Left 12/12/2010   PORT-A-CATH REMOVAL N/A 09/03/2017   Procedure: REMOVAL PORT-A-CATH;  Surgeon: Griselda Miner, MD;  Location: [facility];  Service: General;  Laterality: N/A;   PORTACATH PLACEMENT Left 04/24/2017   Procedure: INSERTION PORT-A-CATH;  Surgeon: Griselda Miner, MD;  Location: [facility];  Service: General;  Laterality: Left;   TUBAL LIGATION  1983   VARICOSE VEIN SURGERY  09/2009 R, 06/2010 L   Patient Active Problem List   Diagnosis Date Noted   Personal history of breast cancer 03/19/2023   Chronic midline low back pain with left-sided sciatica 03/19/2023   Mixed hyperlipidemia 09/17/2022   Senile purpura (HCC) 03/10/2020   Plant dermatitis 12/13/2019   Hot flashes due to tamoxifen 09/06/2019   Morbid exogenous obesity (HCC) 09/28/2017   Port catheter in place 04/28/2017   Genetic testing 04/17/2017   Family history of pancreatic cancer    Family history of colon cancer    Malignant neoplasm of upper-inner quadrant of right breast in female, estrogen receptor positive (HCC) 03/11/2017   Essential hypertension, benign 07/31/2015   IFG (  impaired fasting glucose) 05/03/2015   Psoriasis 05/03/2014   Greater trochanteric bursitis of right hip 03/14/2014   Obesity (BMI 30-39.9) 05/26/2013   GERD (gastroesophageal reflux disease) 12/31/2010   Hypothyroidism 12/31/2010   Vitamin D deficiency 12/31/2010   Pure hypercholesterolemia 12/31/2010    PCP: Joselyn Arrow, MD  REFERRING PROVIDER: Joselyn Arrow, MD  REFERRING DIAG: [phone] (ICD-10-CM) - Chronic midline low back pain with left-sided sciatica  Rationale for Evaluation and Treatment: Rehabilitation  THERAPY DIAG:  Muscle weakness (generalized)  Radiculopathy, lumbar region  ONSET DATE: lumbar radiculopathy                                                                                                                                                                                          SUBJECTIVE STATEMENT: 9/30:  pt admits to not doing her HEP.  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   Evaluation:  Mckenzie Webb states that she has been having back pain for about five years.  She is the worst in the mornings.  Stretching in the mornings helps.  She also completes a piriformis stretch which helps.  She has pain that goes down to her Lt buttock but not below.  She states that the buttock pain is pretty constant.  Standing 30 minutes then she wants to sit down,  Walking 25 minutes.  She takes several breaks if she is completing a lot of her housework.  She can no longer complete all of her housework in one go.   PERTINENT HISTORY:  Se above   PAIN:  Are you having pain? Yes: NPRS scale: 7/10; worst 7/10; 1-2/10 Pain location: low back Pain description: aches Aggravating factors: weight bearing  Relieving factors: extension   PRECAUTIONS: None    WEIGHT BEARING RESTRICTIONS: No  FALLS:  Has patient fallen in last 6 months? No  LIVING ENVIRONMENT: Lives with: lives with their spouse  OCCUPATION: retired  PLOF: Independent  PATIENT GOALS: less pain , to be able to do more without having to sit down.   NEXT MD VISIT: November   OBJECTIVE:    PATIENT SURVEYS:  FOTO 53  COGNITION: Overall cognitive status: Within functional limits for tasks assessed      POSTURE: rounded shoulders, forward head, decreased lumbar lordosis, and decreased  thoracic kyphosis   LUMBAR ROM:   AROM eval  Flexion Fingers to toes going up increases pain.    Extension 30; reps no change  Right lateral flexion   Left lateral flexion   Right rotation   Left rotation    (  Blank rows = not tested)  LOWER EXTREMITY ROM:   WFL   LOWER EXTREMITY MMT:    MMT Right eval Left eval  Hip  flexion 4+ 4+  Hip extension 3+ 3  Hip abduction 4 4-  Hip adduction    Hip internal rotation    Hip external rotation    Knee flexion 4- 3+  Knee extension 5 5  Ankle dorsiflexion 4- 3+  Ankle plantarflexion    Ankle inversion    Ankle eversion     (Blank rows = not tested)  FUNCTIONAL TESTS:  30 seconds chair stand test: 14 ; 14 is good for pt age and sex  2 minute walk test: 464 ft with increased Lt buttock pain and slight limp.  Single leg stance RT: 17"  , LT:  38"  TODAY'S TREATMENT:                                                                                                                              DATE:  05/19/23 Goal review, POC moving forward Sitting: long sitting hamstirng stretch 3X30" each  Piriformis stretch 3X30" each  Scap retraction with ab set 10X5" each Supine:  abdominal iso with marching 10X  Bridge 10X  SLR  10X Standing lumbar extension 10X  Hip abduction 10X each  Hip extension 10X each  Heel raise 10X  Toe raise 10X   05/07/23:  Evaluation  Sitting: Good posture x 5             Ab set x 5             Scapular retraction x 5             Prone: press up x 5             Quadriped piriformis stretch x 2 hold for 30 seconds     PATIENT EDUCATION:  Education details: evaluation  Person educated: Patient Education method: Programmer, multimedia, Verbal cues, and Handouts Education comprehension: returned demonstration  HOME EXERCISE PROGRAM: Access Code: [email or web address] URL: [email or web address] Date: 05/07/2023 Prepared by: Virgina Organ  Exercises - Seated Upright Posture Correction  - 3 x daily - 7 x weekly - 1 sets - 10 reps - 5 seconds  hold - Seated Scapular Retraction  - 2 x daily - 7 x weekly - 1 sets - 10 reps - 5" hold - Seated Transversus Abdominis Bracing  - 2 x daily - 7 x weekly - 1 sets - 10 reps - 5" hold - Prone Press Up  - 2 x daily - 7 x weekly - 1 sets - 5 reps - 3-5" hold - Quadruped Piriformis Stretch  - 2 x  daily - 7 x weekly - 1 sets - 3 reps - 30"  hold  ASSESSMENT:  CLINICAL IMPRESSION:  Reviewed goals and POC moving forward.  PT admits to not doing her HEP but states she will do better this week.  Progressed stretches and stab exericses, however did not additional to HEP as she is still not compliant with ones given.  Pt required general cues for form and hold times with therex.  Good piriformis stretch obtained in seated position. No pain or issues during or at completion of session today.  Pt will continue to benefit from skilled PT to address her deficits and maximize her functional level.   OBJECTIVE IMPAIRMENTS: decreased activity tolerance, decreased balance, difficulty walking, decreased strength, increased fascial restrictions, postural dysfunction, and pain.   ACTIVITY LIMITATIONS: lifting, bending, standing, and locomotion level  PARTICIPATION LIMITATIONS: meal prep, cleaning, shopping, and community activity  PERSONAL FACTORS: Time since onset of injury/illness/exacerbation are also affecting patient's functional outcome.   REHAB POTENTIAL: Good  CLINICAL DECISION MAKING: Stable/uncomplicated  EVALUATION COMPLEXITY: Low   GOALS: Goals reviewed with patient? No  SHORT TERM GOALS: Target date: 05/28/2023  Pt to be I in HEP to decrease her back pain to a max of 5/10 throughout the day Baseline: Goal status: IN PROGRESS  2.  PT LE strength to be increased by 1/2 grade to allow pt to be able to walk for 30 minutes without increased pain.  Baseline:  Goal status: IN PROGRESS  LONG TERM GOALS: Target date: 06/18/23  Pt to be I in an advanced HEP to decrease her back pain to a max of 3/10 throughout the day Baseline:  Goal status: IN PROGRESS  2.   PT LE strength to be increased by 2 grade to allow pt to be able to walk for 60 minutes without increased pain. Baseline:  Goal status: IN PROGRESS  3.  PT to be able to complete 2 hours of house work without having to sit  and rest.   Baseline:  Goal status: IN PROGRESS    PLAN:  PT FREQUENCY: 2x/week  PT DURATION: 6 weeks  PLANNED INTERVENTIONS: Therapeutic exercises, Therapeutic activity, Neuromuscular re-education, Balance training, Gait training, Patient/Family education, Self Care, and Manual therapy. PLAN FOR NEXT SESSION: begin knee to chest stretch and progress lumbar stabilization program.    Lurena Nida, PTA/CLT [facility] Ph: [phone]   05/19/2023, 3:30 PM

## 2023-05-20 ENCOUNTER — Encounter: Payer: Self-pay | Admitting: Family Medicine

## 2023-05-20 ENCOUNTER — Other Ambulatory Visit: Payer: Self-pay | Admitting: *Deleted

## 2023-05-20 MED ORDER — ROSUVASTATIN CALCIUM 20 MG PO TABS: 20.0000 mg | ORAL_TABLET | ORAL | 0 refills | Status: DC

## 2023-05-22 ENCOUNTER — Ambulatory Visit (HOSPITAL_COMMUNITY): Payer: Medicare HMO | Attending: Family Medicine | Admitting: Physical Therapy

## 2023-05-22 DIAGNOSIS — M6281 Muscle weakness (generalized): Secondary | ICD-10-CM | POA: Diagnosis not present

## 2023-05-22 DIAGNOSIS — M5416 Radiculopathy, lumbar region: Secondary | ICD-10-CM | POA: Insufficient documentation

## 2023-05-22 NOTE — Therapy (Signed)
OUTPATIENT PHYSICAL THERAPY TREATMENT   Patient Name: Mckenzie Webb MRN: [id number] DOB:Nov 21, 1945, 77 y.o., female Today's Date: 05/22/2023  END OF SESSION:   PT End of Session - 05/22/23 1559     Visit Number 3    Number of Visits 12    Date for PT Re-Evaluation 06/18/23    Authorization Type [employer] medicare    Progress Note Due on Visit 10    PT Start Time 1515    PT Stop Time 1559    PT Time Calculation (min) 44 min    Activity Tolerance Patient tolerated treatment well    Behavior During Therapy [facility] for tasks assessed/performed                 Past Medical History:  Diagnosis Date   Arthritis    lower back   Cough 07/22/2017   Dental crowns present    Family history of adverse reaction to anesthesia    pt's sister has hx. of post-op N/V   GERD (gastroesophageal reflux disease)    History of chemotherapy    finished chemo 06/30/2017   History of radiation therapy 07/29/17- 08/28/17   Right Breast and Axilla treated to 42.56 Gy with 16 fx of 2.66. Boost of 8 Gy with 4 fx of 2 Gy.    History of right breast cancer 02/2017   Hyperlipidemia    Hypothyroid    Leukopenia 07/07/2017   Personal history of chemotherapy    Personal history of radiation therapy    Stuffy and runny nose 07/22/2017   clear drainage from nose, per pt.   Past Surgical History:  Procedure Laterality Date   BREAST BIOPSY Right 03/31/2023   [place] RT BREAST BX W LOC DEV 1ST LESION IMG BX SPEC US GUIDE 03/31/2023 [facility]MOGRAP[facility]   BREAST LUMPECTOMY Right 03/26/2017   BREAST LUMPECTOMY WITH RADIOACTIVE SEED AND SENTINEL LYMPH NODE BIOPSY Right 03/26/2017   Procedure: BREAST LUMPECTOMY WITH RADIOACTIVE SEED AND SENTINEL LYMPH NODE BIOPSY;  Surgeon: Griselda Miner, MD;  Location: [facility];  Service: General;  Laterality: Right;   CARPAL TUNNEL RELEASE Right 01/05/2021   R CTR with RMF trigger release (Dr. Amanda Pea)   CATARACT EXTRACTION Left 2021   CHOLECYSTECTOMY      COLONOSCOPY  10/2007   CYST EXCISION  02/01/2003   knee   KNEE ARTHROSCOPY Right 1990   KNEE ARTHROSCOPY Left 12/12/2010   PORT-A-CATH REMOVAL N/A 09/03/2017   Procedure: REMOVAL PORT-A-CATH;  Surgeon: Griselda Miner, MD;  Location: [facility];  Service: General;  Laterality: N/A;   PORTACATH PLACEMENT Left 04/24/2017   Procedure: INSERTION PORT-A-CATH;  Surgeon: Griselda Miner, MD;  Location: [facility];  Service: General;  Laterality: Left;   TUBAL LIGATION  1983   VARICOSE VEIN SURGERY  09/2009 R, 06/2010 L   Patient Active Problem List   Diagnosis Date Noted   Personal history of breast cancer 03/19/2023   Chronic midline low back pain with left-sided sciatica 03/19/2023   Mixed hyperlipidemia 09/17/2022   Senile purpura (HCC) 03/10/2020   Plant dermatitis 12/13/2019   Hot flashes due to tamoxifen 09/06/2019   Morbid exogenous obesity (HCC) 09/28/2017   Port catheter in place 04/28/2017   Genetic testing 04/17/2017   Family history of pancreatic cancer    Family history of colon cancer    Malignant neoplasm of upper-inner quadrant of right breast in female, estrogen receptor positive (HCC) 03/11/2017   Essential hypertension, benign 07/31/2015  IFG (impaired fasting glucose) 05/03/2015   Psoriasis 05/03/2014   Greater trochanteric bursitis of right hip 03/14/2014   Obesity (BMI 30-39.9) 05/26/2013   GERD (gastroesophageal reflux disease) 12/31/2010   Hypothyroidism 12/31/2010   Vitamin D deficiency 12/31/2010   Pure hypercholesterolemia 12/31/2010    PCP: Joselyn Arrow, MD  REFERRING PROVIDER: Joselyn Arrow, MD  REFERRING DIAG: [phone] (ICD-10-CM) - Chronic midline low back pain with left-sided sciatica  Rationale for Evaluation and Treatment: Rehabilitation  THERAPY DIAG:  Muscle weakness (generalized)  Radiculopathy, lumbar region  ONSET DATE: lumbar radiculopathy                                                                                                                                                                                          SUBJECTIVE STATEMENT:  PT states that she has done the exercises for the last two days without much change.     Evaluation:  Mckenzie Webb states that she has been having back pain for about five years.  She is the worst in the mornings.  Stretching in the mornings helps.  She also completes a piriformis stretch which helps.  She has pain that goes down to her Lt buttock but not below.  She states that the buttock pain is pretty constant.  Standing 30 minutes then she wants to sit down,  Walking 25 minutes.  She takes several breaks if she is completing a lot of her housework.  She can no longer complete all of her housework in one go.   PERTINENT HISTORY:  Se above   PAIN:  Are you having pain? Yes: NPRS scale: 4/10; worst 7/10; best 1-2/10 Pain location: low back Pain description: aches Aggravating factors: weight bearing  Relieving factors: extension   PRECAUTIONS: None    WEIGHT BEARING RESTRICTIONS: No  FALLS:  Has patient fallen in last 6 months? No  LIVING ENVIRONMENT: Lives with: lives with their spouse  OCCUPATION: retired  PLOF: Independent  PATIENT GOALS: less pain , to be able to do more without having to sit down.   NEXT MD VISIT: November   OBJECTIVE:    PATIENT SURVEYS:  FOTO 53  COGNITION: Overall cognitive status: Within functional limits for tasks assessed      POSTURE: rounded shoulders, forward head, decreased lumbar lordosis, and decreased  thoracic kyphosis   LUMBAR ROM:   AROM eval  Flexion Fingers to toes going up increases pain.    Extension 30; reps no change  Right lateral flexion   Left lateral flexion   Right rotation   Left rotation    (Blank rows = not tested)  LOWER EXTREMITY ROM:  WFL   LOWER EXTREMITY MMT:    MMT Right eval Left eval  Hip flexion 4+ 4+  Hip extension 3+ 3  Hip abduction 4 4-  Hip adduction    Hip  internal rotation    Hip external rotation    Knee flexion 4- 3+  Knee extension 5 5  Ankle dorsiflexion 4- 3+  Ankle plantarflexion    Ankle inversion    Ankle eversion     (Blank rows = not tested)  FUNCTIONAL TESTS:  30 seconds chair stand test: 14 ; 14 is good for pt age and sex  2 minute walk test: 464 ft with increased Lt buttock pain and slight limp.  Single leg stance RT: 17"  , LT:  38"  TODAY'S TREATMENT:                                                                                                                              DATE:  05/22/23 Prone: Press up x 10 Prone hip extension x 10 Supine:  Active hamstring stretch x 3 with nerve flossing  Knee to chest x 3  SLR x 10 Bridge x 10 x 5" Sit to stand x 10  Standing squat x 10  Stand against wall stab back and raise arms x 10  Theraband: Green Scapular retraction x 10 Rows x 10 Shoulder extension x 10  05/19/23 Goal review, POC moving forward Sitting: long sitting hamstirng stretch 3X30" each  Piriformis stretch 3X30" each  Scap retraction with ab set 10X5" each Supine:  abdominal iso with marching 10X  Bridge 10X  SLR  10X Standing lumbar extension 10X  Hip abduction 10X each  Hip extension 10X each  Heel raise 10X  Toe raise 10X   05/07/23:  Evaluation  Sitting: Good posture x 5             Ab set x 5             Scapular retraction x 5             Prone: press up x 5             Quadriped piriformis stretch x 2 hold for 30 seconds     PATIENT EDUCATION:  Education details: evaluation  Person educated: Patient Education method: Programmer, multimedia, Verbal cues, and Handouts Education comprehension: returned demonstration  HOME EXERCISE PROGRAM: Access Code: [email or web address] URL: [email or web address] Date: 05/07/2023 Prepared by: Virgina Organ  Exercises - Seated Upright Posture Correction  - 3 x daily - 7 x weekly - 1 sets - 10 reps - 5 seconds  hold - Seated Scapular Retraction  - 2  x daily - 7 x weekly - 1 sets - 10 reps - 5" hold - Seated Transversus Abdominis Bracing  - 2 x daily - 7 x weekly - 1 sets - 10 reps - 5" hold - Prone Press Up  - 2 x daily - 7 x weekly -  1 sets - 5 reps - 3-5" hold - Quadruped Piriformis Stretch  - 2 x daily - 7 x weekly - 1 sets - 3 reps - 30"  hold 05/22/23 - Prone Hip Extension  - 2 x daily - 7 x weekly - 1 sets - 10 reps - 5" hold - Supine Sciatic Nerve Glide  - 2 x daily - 7 x weekly - 1 sets - 3 reps - 30" hold - Supine Single Knee to Chest Stretch  - 2 x daily - 7 x weekly - 1 sets - 3 reps - 30" hold - Supine Straight Leg Raises  - 2 x daily - 7 x weekly - 1 sets - 10 reps - 3" hold ASSESSMENT:  CLINICAL IMPRESSION:  Reviewed goals and POC moving forward.  PT admits to not doing her HEP but states she will do better this week.  Progressed stretches and stab exericses, however did not additional to HEP as she is still not compliant with ones given.  Pt required general cues for form and hold times with therex.  Good piriformis stretch obtained in seated position. No pain or issues during or at completion of session today.  Pt will continue to benefit from skilled PT to address her deficits and maximize her functional level.   OBJECTIVE IMPAIRMENTS: decreased activity tolerance, decreased balance, difficulty walking, decreased strength, increased fascial restrictions, postural dysfunction, and pain.   ACTIVITY LIMITATIONS: lifting, bending, standing, and locomotion level  PARTICIPATION LIMITATIONS: meal prep, cleaning, shopping, and community activity  PERSONAL FACTORS: Time since onset of injury/illness/exacerbation are also affecting patient's functional outcome.   REHAB POTENTIAL: Good  CLINICAL DECISION MAKING: Stable/uncomplicated  EVALUATION COMPLEXITY: Low   GOALS: Goals reviewed with patient? No  SHORT TERM GOALS: Target date: 05/28/2023  Pt to be I in HEP to decrease her back pain to a max of 5/10 throughout the  day Baseline: Goal status: IN PROGRESS  2.  PT LE strength to be increased by 1/2 grade to allow pt to be able to walk for 30 minutes without increased pain.  Baseline:  Goal status: IN PROGRESS  LONG TERM GOALS: Target date: 06/18/23  Pt to be I in an advanced HEP to decrease her back pain to a max of 3/10 throughout the day Baseline:  Goal status: IN PROGRESS  2.   PT LE strength to be increased by 2 grade to allow pt to be able to walk for 60 minutes without increased pain. Baseline:  Goal status: IN PROGRESS  3.  PT to be able to complete 2 hours of house work without having to sit and rest.   Baseline:  Goal status: IN PROGRESS    PLAN:  PT FREQUENCY: 2x/week  PT DURATION: 6 weeks  PLANNED INTERVENTIONS: Therapeutic exercises, Therapeutic activity, Neuromuscular re-education, Balance training, Gait training, Patient/Family education, Self Care, and Manual therapy. PLAN FOR NEXT SESSION: begin knee to chest stretch and progress lumbar stabilization program.     Virgina Organ, PT CLT [phone]     05/22/2023, 4:00 PM

## 2023-05-26 ENCOUNTER — Ambulatory Visit (HOSPITAL_COMMUNITY): Payer: Medicare HMO | Admitting: Physical Therapy

## 2023-05-26 DIAGNOSIS — M6281 Muscle weakness (generalized): Secondary | ICD-10-CM

## 2023-05-26 DIAGNOSIS — M5416 Radiculopathy, lumbar region: Secondary | ICD-10-CM | POA: Diagnosis not present

## 2023-05-26 NOTE — Therapy (Signed)
OUTPATIENT PHYSICAL THERAPY TREATMENT   Patient Name: Mckenzie Webb MRN: [id number] DOB:[birth year], 77 y.o., female Today's Date: 05/26/2023  END OF SESSION:   PT End of Session - 05/26/23 1428    Visit Number 4    Number of Visits 12    Date for PT Re-Evaluation 06/18/23    Authorization Type [employer] medicare    Progress Note Due on Visit 10    PT Start Time 1343    PT Stop Time 1428    PT Time Calculation (min) 45 min            Past Medical History:  Diagnosis Date   Arthritis    lower back   Cough 07/22/2017   Dental crowns present    Family history of adverse reaction to anesthesia    pt's sister has hx. of post-op N/V   GERD (gastroesophageal reflux disease)    History of chemotherapy    finished chemo 06/30/2017   History of radiation therapy 07/29/17- 08/28/17   Right Breast and Axilla treated to 42.56 Gy with 16 fx of 2.66. Boost of 8 Gy with 4 fx of 2 Gy.    History of right breast cancer 02/2017   Hyperlipidemia    Hypothyroid    Leukopenia 07/07/2017   Personal history of chemotherapy    Personal history of radiation therapy    Stuffy and runny nose 07/22/2017   clear drainage from nose, per pt.   Past Surgical History:  Procedure Laterality Date   BREAST BIOPSY Right 03/31/2023   [place] RT BREAST BX W LOC DEV 1ST LESION IMG BX SPEC US GUIDE 03/31/2023 [facility]   BREAST LUMPECTOMY Right 03/26/2017   BREAST LUMPECTOMY WITH RADIOACTIVE SEED AND SENTINEL LYMPH NODE BIOPSY Right 03/26/2017   Procedure: BREAST LUMPECTOMY WITH RADIOACTIVE SEED AND SENTINEL LYMPH NODE BIOPSY;  Surgeon: Griselda Miner, MD;  Location: [facility];  Service: General;  Laterality: Right;   CARPAL TUNNEL RELEASE Right 01/05/2021   R CTR with RMF trigger release (Dr. Amanda Pea)   CATARACT EXTRACTION Left 2021   CHOLECYSTECTOMY     COLONOSCOPY  10/2007   CYST EXCISION  02/01/2003   knee   KNEE ARTHROSCOPY Right 1990   KNEE ARTHROSCOPY Left 12/12/2010    PORT-A-CATH REMOVAL N/A 09/03/2017   Procedure: REMOVAL PORT-A-CATH;  Surgeon: Griselda Miner, MD;  Location: [facility];  Service: General;  Laterality: N/A;   PORTACATH PLACEMENT Left 04/24/2017   Procedure: INSERTION PORT-A-CATH;  Surgeon: Griselda Miner, MD;  Location: [facility];  Service: General;  Laterality: Left;   TUBAL LIGATION  1983   VARICOSE VEIN SURGERY  09/2009 R, 06/2010 L   Patient Active Problem List   Diagnosis Date Noted   Personal history of breast cancer 03/19/2023   Chronic midline low back pain with left-sided sciatica 03/19/2023   Mixed hyperlipidemia 09/17/2022   Senile purpura (HCC) 03/10/2020   Plant dermatitis 12/13/2019   Hot flashes due to tamoxifen 09/06/2019   Morbid exogenous obesity (HCC) 09/28/2017   Port catheter in place 04/28/2017   Genetic testing 04/17/2017   Family history of pancreatic cancer    Family history of colon cancer    Malignant neoplasm of upper-inner quadrant of right breast in female, estrogen receptor positive (HCC) 03/11/2017   Essential hypertension, benign 07/31/2015   IFG (impaired fasting glucose) 05/03/2015   Psoriasis 05/03/2014   Greater trochanteric bursitis of right hip 03/14/2014   Obesity (BMI 30-39.9) 05/26/2013  GERD (gastroesophageal reflux disease) 12/31/2010   Hypothyroidism 12/31/2010   Vitamin D deficiency 12/31/2010   Pure hypercholesterolemia 12/31/2010    PCP: Joselyn Arrow, MD  REFERRING PROVIDER: Joselyn Arrow, MD  REFERRING DIAG: [phone] (ICD-10-CM) - Chronic midline low back pain with left-sided sciatica  Rationale for Evaluation and Treatment: Rehabilitation  THERAPY DIAG:  Muscle weakness (generalized)  Radiculopathy, lumbar region  ONSET DATE: lumbar radiculopathy                                                                                                                                                                                         SUBJECTIVE STATEMENT:  PT  states that she has just finished vacuuming and mopping and her back is aggravated.  She did not have time to completed her exercises over the weekend .     Evaluation:  Mckenzie Webb states that she has been having back pain for about five years.  She is the worst in the mornings.  Stretching in the mornings helps.  She also completes a piriformis stretch which helps.  She has pain that goes down to her Lt buttock but not below.  She states that the buttock pain is pretty constant.  Standing 30 minutes then she wants to sit down,  Walking 25 minutes.  She takes several breaks if she is completing a lot of her housework.  She can no longer complete all of her housework in one go.   PERTINENT HISTORY:  Se above   PAIN:  Are you having pain? Yes: NPRS scale: 7/10; worst 7/10; best 1-2/10 Pain location: low back Pain description: aches Aggravating factors: weight bearing  Relieving factors: extension   PRECAUTIONS: None    WEIGHT BEARING RESTRICTIONS: No  FALLS:  Has patient fallen in last 6 months? No  LIVING ENVIRONMENT: Lives with: lives with their spouse  OCCUPATION: retired  PLOF: Independent  PATIENT GOALS: less pain , to be able to do more without having to sit down.   NEXT MD VISIT: November   OBJECTIVE:    PATIENT SURVEYS:  FOTO 53  COGNITION: Overall cognitive status: Within functional limits for tasks assessed      POSTURE: rounded shoulders, forward head, decreased lumbar lordosis, and decreased  thoracic kyphosis   LUMBAR ROM:   AROM eval  Flexion Fingers to toes going up increases pain.    Extension 30; reps no change  Right lateral flexion   Left lateral flexion   Right rotation   Left rotation    (Blank rows = not tested)  LOWER EXTREMITY ROM:   WFL   LOWER EXTREMITY MMT:    MMT Right  eval Left eval  Hip flexion 4+ 4+  Hip extension 3+ 3  Hip abduction 4 4-  Hip adduction    Hip internal rotation    Hip external rotation    Knee  flexion 4- 3+  Knee extension 5 5  Ankle dorsiflexion 4- 3+  Ankle plantarflexion    Ankle inversion    Ankle eversion     (Blank rows = not tested)  FUNCTIONAL TESTS:  30 seconds chair stand test: 14 ; 14 is good for pt age and sex  2 minute walk test: 464 ft with increased Lt buttock pain and slight limp.  Single leg stance RT: 17"  , LT:  38"  TODAY'S TRE 05/26/23 Supine exercises while on HMP to decrease pain. Ab set x 10 LTR x 10  Knee to chest 30"  x 3 Hamstring stretch x 3 Piriformis stretch 3 x 30"  Bridge x 10  Side lying hip abduction x 10  Prone:  Glut set x 10 Heel squeeze x 10  Standing UE flexion against wall while stabilizing back Lunge with back stab Wall arch x 10  05/22/23 Prone: Press up x 10 Prone hip extension x 10 Supine:  Active hamstring stretch x 3 with nerve flossing  Knee to chest x 3  SLR x 10 Bridge x 10 x 5" Sit to stand x 10  Standing squat x 10  Stand against wall stab back and raise arms x 10  Theraband: Green Scapular retraction x 10 Rows x 10 Shoulder extension x 10  05/19/23 Goal review, POC moving forward Sitting: long sitting hamstirng stretch 3X30" each  Piriformis stretch 3X30" each  Scap retraction with ab set 10X5" each Supine:  abdominal iso with marching 10X  Bridge 10X  SLR  10X Standing lumbar extension 10X  Hip abduction 10X each  Hip extension 10X each  Heel raise 10X  Toe raise 10X   05/07/23:  Evaluation  Sitting: Good posture x 5             Ab set x 5             Scapular retraction x 5             Prone: press up x 5             Quadriped piriformis stretch x 2 hold for 30 seconds     PATIENT EDUCATION:  Education details: bed body mechanics, body mechanics for mopping  Person educated: Patient Education method: Programmer, multimedia, Verbal cues, and Handouts Education comprehension: returned demonstration  HOME EXERCISE PROGRAM: Access Code: [email or web address] URL: [email or web address] Date:  05/07/2023 Prepared by: Virgina Organ  Exercises - Seated Upright Posture Correction  - 3 x daily - 7 x weekly - 1 sets - 10 reps - 5 seconds  hold - Seated Scapular Retraction  - 2 x daily - 7 x weekly - 1 sets - 10 reps - 5" hold - Seated Transversus Abdominis Bracing  - 2 x daily - 7 x weekly - 1 sets - 10 reps - 5" hold - Prone Press Up  - 2 x daily - 7 x weekly - 1 sets - 5 reps - 3-5" hold - Quadruped Piriformis Stretch  - 2 x daily - 7 x weekly - 1 sets - 3 reps - 30"  hold 05/22/23 - Prone Hip Extension  - 2 x daily - 7 x weekly - 1 sets - 10 reps - 5" hold - Supine Sciatic Nerve  Glide  - 2 x daily - 7 x weekly - 1 sets - 3 reps - 30" hold - Supine Single Knee to Chest Stretch  - 2 x daily - 7 x weekly - 1 sets - 3 reps - 30" hold - Supine Straight Leg Raises  - 2 x daily - 7 x weekly - 1 sets - 10 reps - 3" hold ASSESSMENT:  CLINICAL IMPRESSION: Pt with increased pain due to completing housework prior to coming to therapy.  Therapist worked on [employer], stability exercises and began education on Psychiatrist.. No pain during session with pain level decreased to a 3.  Pt will continue to benefit from skilled PT to address her deficits and maximize her functional level.   OBJECTIVE IMPAIRMENTS: decreased activity tolerance, decreased balance, difficulty walking, decreased strength, increased fascial restrictions, postural dysfunction, and pain.   ACTIVITY LIMITATIONS: lifting, bending, standing, and locomotion level  PARTICIPATION LIMITATIONS: meal prep, cleaning, shopping, and community activity  PERSONAL FACTORS: Time since onset of injury/illness/exacerbation are also affecting patient's functional outcome.   REHAB POTENTIAL: Good  CLINICAL DECISION MAKING: Stable/uncomplicated  EVALUATION COMPLEXITY: Low   GOALS: Goals reviewed with patient? No  SHORT TERM GOALS: Target date: 05/28/2023  Pt to be I in HEP to decrease her back pain to a max of 5/10  throughout the day Baseline: Goal status: IN PROGRESS  2.  PT LE strength to be increased by 1/2 grade to allow pt to be able to walk for 30 minutes without increased pain.  Baseline:  Goal status: IN PROGRESS  LONG TERM GOALS: Target date: 06/18/23  Pt to be I in an advanced HEP to decrease her back pain to a max of 3/10 throughout the day Baseline:  Goal status: IN PROGRESS  2.   PT LE strength to be increased by 2 grade to allow pt to be able to walk for 60 minutes without increased pain. Baseline:  Goal status: IN PROGRESS  3.  PT to be able to complete 2 hours of house work without having to sit and rest.   Baseline:  Goal status: IN PROGRESS    PLAN:  PT FREQUENCY: 2x/week  PT DURATION: 6 weeks  PLANNED INTERVENTIONS: Therapeutic exercises, Therapeutic activity, Neuromuscular re-education, Balance training, Gait training, Patient/Family education, Self Care, and Manual therapy. PLAN FOR NEXT SESSION: begin knee to chest stretch and progress lumbar stabilization program.     Virgina Organ, PT CLT [phone]     05/26/2023, 2:29 PM

## 2023-05-29 ENCOUNTER — Encounter (HOSPITAL_COMMUNITY): Payer: Medicare HMO | Admitting: Physical Therapy

## 2023-06-02 ENCOUNTER — Encounter: Payer: Self-pay | Admitting: Family Medicine

## 2023-06-02 DIAGNOSIS — R059 Cough, unspecified: Secondary | ICD-10-CM

## 2023-06-09 ENCOUNTER — Ambulatory Visit
Admission: RE | Admit: 2023-06-09 | Discharge: 2023-06-09 | Disposition: A | Discharge status: Home / Self Care | Payer: Medicare HMO | Source: Ambulatory Visit | Attending: Family Medicine | Admitting: Family Medicine

## 2023-06-09 DIAGNOSIS — R059 Cough, unspecified: Secondary | ICD-10-CM

## 2023-06-12 ENCOUNTER — Other Ambulatory Visit: Payer: Self-pay | Admitting: Medical

## 2023-06-20 ENCOUNTER — Other Ambulatory Visit (INDEPENDENT_AMBULATORY_CARE_PROVIDER_SITE_OTHER): Payer: Medicare HMO

## 2023-06-20 DIAGNOSIS — Z23 Encounter for immunization: Secondary | ICD-10-CM | POA: Diagnosis not present

## 2023-06-20 DIAGNOSIS — E782 Mixed hyperlipidemia: Secondary | ICD-10-CM

## 2023-06-21 LAB — LIPID PANEL
Chol/HDL Ratio: 3.9 ratio (ref 0.0–4.4)
Cholesterol, Total: 213 mg/dL — ABNORMAL HIGH (ref 100–199)
HDL: 54 mg/dL (ref >39)
LDL Chol Calc (NIH): 130 mg/dL — ABNORMAL HIGH (ref 0–99)
Triglycerides: 162 mg/dL — ABNORMAL HIGH (ref 0–149)
VLDL Cholesterol Cal: 29 mg/dL (ref 5–40)

## 2023-06-23 ENCOUNTER — Other Ambulatory Visit: Payer: Self-pay | Admitting: Internal Medicine

## 2023-06-23 MED ORDER — ROSUVASTATIN CALCIUM 20 MG PO TABS: 20.0000 mg | ORAL_TABLET | ORAL | 0 refills | Status: DC

## 2023-07-18 ENCOUNTER — Other Ambulatory Visit: Payer: Self-pay | Admitting: Family Medicine

## 2023-07-20 ENCOUNTER — Other Ambulatory Visit: Payer: Self-pay | Admitting: Family Medicine

## 2023-07-20 DIAGNOSIS — I10 Essential (primary) hypertension: Secondary | ICD-10-CM

## 2023-08-05 ENCOUNTER — Encounter (HOSPITAL_COMMUNITY): Payer: Self-pay

## 2023-08-05 NOTE — Therapy (Signed)
[facility] Outpatient Rehabilitation at [facility] [address] [place], [place], [address] Phone: [phone]   Fax:  [phone]  Patient Details  Name: Mckenzie Webb MRN: [id number] Date of Birth: [birth year] Referring Provider:  No ref. provider found  Encounter Date: 08/05/2023 PHYSICAL THERAPY DISCHARGE SUMMARY  Visits from Start of Care: 4  Current functional level related to goals / functional outcomes: Unknown   Remaining deficits: Unknown   Education / Equipment: NA   Patient being discharged from PT services at this time. Patient goals were not met. Patient is being discharged due to not returning since the last visit.  Nelida Meuse, PT 08/05/2023, 9:10 AM  [facility] Outpatient Rehabilitation at [facility] [address] [place], [place], [address] Phone: [phone]   Fax:  [phone]

## 2023-09-04 ENCOUNTER — Other Ambulatory Visit: Payer: Self-pay | Admitting: Family Medicine

## 2023-09-04 DIAGNOSIS — E039 Hypothyroidism, unspecified: Secondary | ICD-10-CM

## 2023-09-04 NOTE — Telephone Encounter (Signed)
Called patient again, her husband said she will call office tomorrow and she is not available and will not answer cell.

## 2023-09-04 NOTE — Telephone Encounter (Signed)
Mckenzie Webb called back and states she does need a refill on her levothyroxine, she has about 3 left.

## 2023-09-04 NOTE — Telephone Encounter (Signed)
Left message asking patient if she needs this filled prior to 09/22/23 appt.

## 2023-09-18 ENCOUNTER — Other Ambulatory Visit: Payer: Self-pay | Admitting: Family Medicine

## 2023-09-18 ENCOUNTER — Other Ambulatory Visit: Payer: Self-pay | Admitting: *Deleted

## 2023-09-18 MED ORDER — ROSUVASTATIN CALCIUM 20 MG PO TABS: 20.0000 mg | ORAL_TABLET | ORAL | 0 refills | Status: DC

## 2023-09-21 NOTE — Progress Notes (Unsigned)
No chief complaint on file.  Mckenzie Webb is a 78 y.o. female who presents for annual wellness visit and follow-up on chronic medical conditions.    She was seen for a sick visit in September, treated with antibiotics, and had prolonged cough. The cough didn't respond to a steroid course, so she was sent for CXR which was normal (incidental finding of eventration of the anterior right hemidiaphragm).   Cough resolved?***  Hypercholesterolemia:  She was compliant with taking Crestor 10mg  every other day (she had myalgias, "weakness" in her legs when it was taken daily, despite using coenzyme Q10; also previously didn't tolerate lipitor, achey in legs, "felt like she couldn't move"). TG remained elevated. In July she had reported cutting out her morning cookies, cut back on sweets.  Was still having some sweet tea and regular soda. I wanted to clarify how much fish oil she was getting, taking 1 capsule twice daily  (was it 1400 per capsule or per "serving", which could be 2?). We rechecked lipids last in November, and it wasn't much different. She hadn't gotten back to [place] about the fish oil dose--advised that if she wasn't truly getting 2800 mg daily, she could try increasing her dose (3000 mg was recommended).  If she was truly getting 1800 mg, then we recommended gradually increasing her rosuvastatin-- doubling up once a week, and continue 1 tablet every other day the other 2x/week.  If tolerating, then try doubling up every other DOSE, and maybe eventually double every other DAY.  Reminded to take coenzyme Q10 daily still.  She has been taking *** She is taking fish oil 2800 mg (1400 mg BID)   Diet--bacon and eggs once a week, uses 2% milk in her cereal, and red meat 2x/week or less. She avoids fried foods.    Rare "mini" regular soda  Butter? Cheese? Creamy soups/dressings/sauces? ***  Due for recheck today.   Component Ref Range & Units (hover) 3 mo ago (06/20/23) 6 mo ago (03/19/23)  1 yr ago (09/16/22) 2 yr ago (09/12/21) 3 yr ago (09/11/20) 4 yr ago (09/06/19) 4 yr ago (04/07/19)  Cholesterol, Total 213 High  228 High  223 High  196 193 176 165  Triglycerides 162 High  222 High  240 High  237 High  203 High  106 140  HDL 54 55 53 49 52 69 58  VLDL Cholesterol Cal 29 39 42 High  41 High  35 19 28  LDL Chol Calc (NIH) 130 High  134 High  128 High  106 High  106 High  88   Chol/HDL Ratio 3.9 4.1 CM 4.2 CM 4.0 CM 3.7 CM 2.6 CM 2.8 CM     Hypertension:  BP's are checked occasionally, and have been running *** She is compliant with HCTZ 12.5 mg, denies side effects. She eats bananas in her diet. She denies muscle cramps. Denies headaches, dizziness, chest pain, shortness of breath or edema. She reports her monitor was verified as accurate in the past.    BP Readings from Last 3 Encounters:  04/30/23 130/80  04/17/23 138/83  03/19/23 130/80      Hypothyroidism:  She reports compliance with her medications.  Dose was increased from 75 to 88 mcg in January 2023--TSH was 5.380 and she reported fatigue.  Recheck of TSH in March was at goal, and last check in 08/2022 was okay.   She denies any changes in hair/skin/nails, bowels, moods. "I'm always dry"--skin, scalp, but this is no  different for her. Energy fluctuates, some days are better than others. Due for recheck today.   Lab Results  Component Value Date   TSH 3.590 09/16/2022    GERD--She is under the care of Dr. Loreta Webb.  She had been on nightly Omeprazole 40mg  for a long time, and had been switched to famotidine 40mg  BID.  Last year she reported stopping the famotidine, was doing well just using Tums as needed, but admitted to needing a Tums once daily.  When she saw Dr. Loreta Webb in June, she was advised to restart the famotidine 40 mg BID, and has been doing well on this, without heartburn. She denies heartburn, chest pain, dysphagia. She continues on probiotic daily, Align.   She saw Dr. Loreta Webb in June 2024 with complaints  of rectal bleeding, episodes of fecal incontinence. She was advised to eat a high fiber diet and to avoid artifical sweeteners (contribute to diarrhea and fecal incontinence),  and reflux measures. She stated bleeding was from hemorrhoids. Today she reports bowels continue to be improved, no further incontinence. Hemorrhoidal bleeding?? *** UPDATE    Low back pain:  Had been under the care of neurosurgeon for years, getting epidural injections about q 3 months (last late 2023). She wasn't sure that it made that much of a difference, so has opted not to continue getting these injections. She still has issues with pain into the buttock, but no longer has any pain into the leg. She has pain when she first wakes up in the morning, improves with stretching and walking around.  No longer hurts with prolonged standing. She was having trouble with doing her housework. She was referred to PT, and went until early October.  ***UPDATE Doing home exercise??? Did she f/u with neurosurgeon, get another injection??    H/o IFG:  Last A1c was 5.7% in 02/2023, same as 08/2022 and 2 years ago. Diet as reported above.    History of right breast cancer: s/p lumpectomy and radiation in 2018. Completed tamoxifen 05/2022. She had cellulitis of R breast in 12/2022, treated with 2 courses of antibiotics (doxy and clinda). She had abnormal mammogram in July, and underwent R breast biopsy in 03/2023, with benign results--fat necrosis with dystrophic calcifications, negative for malignancy.     Psoriasis:  She reports this is under control currently. She uses clobetasol prn. She previously noted that her skin clears up for a few weeks after her epidural injections for her back (if flaring at the time).  She used to help control her psoriasis by using tanning bed, but none since she was diagnosed with cancer 2018. She sees dermatologist yearly for skin checks.    Immunization History  Administered Date(s) Administered    Fluad Quad(high Dose 65+) 06/18/2019, 06/05/2020, 07/06/2021, 09/16/2022   Fluad Trivalent(High Dose 65+) 06/20/2023   Influenza Split 06/20/2011, 06/19/2012, 05/25/2014   Influenza Whole 05/19/2000, 05/19/2001   Influenza, High Dose Seasonal PF 05/23/2016, 06/04/2018   Influenza,inj,Quad PF,6+ Mos 08/01/2017   Influenza-Unspecified 05/19/2014, 05/24/2015, 04/19/2020   Moderna Sars-Covid-2 Vaccination 09/24/2019, 10/23/2019, 08/03/2020   PNEUMOCOCCAL CONJUGATE-20 09/16/2022   Pneumococcal Conjugate-13 07/26/2013   Pneumococcal Polysaccharide-23 06/16/2000, 07/27/2014   Td 04/13/2001   Tdap 12/31/2010, 08/18/2023   Zoster, Live 07/26/2013   Last Pap smear: 04/2021, normal Last mammogram: 02/2023 (and benign biopsy R breast in 03/2023) Last colonoscopy: 12/2017 Dr. Loreta Webb, told 5yr f/u (if any)   Last DEXA: 02/2018, normal Dentist: every 6 months   Ophtho: yearly Exercise:    Walking in  front of the TV 20 minutes/day.  Limited exercise September through December when she wasn't feeling well.  She is working her way back up again.  Hasn't restarted weights yet.   Thinking of participating in an exercise program at a gym in [place] (6 week program).  Vitamin D level was normal in 2012   Patient Care Team: Joselyn Arrow, MD as PCP - General (Family Medicine) Griselda Miner, MD as Consulting Physician (General Surgery) Lonie Peak, MD as Attending Physician (Radiation Oncology) Charna Elizabeth, MD as Consulting Physician (Gastroenterology) Annamaria Helling, MD as Consulting Physician (Obstetrics and Gynecology) Salvatore Marvel, MD as Consulting Physician (Orthopedic Surgery) [place], Amy, MD as Consulting Physician (Dermatology) Axel Filler, Larna Daughters, NP as Nurse Practitioner (Hematology and Oncology) Annamaria Helling, MD as Consulting Physician (Obstetrics and Gynecology) Daisy Lazar, DO (Optometry) Sallye Lat, MD as Consulting Physician (Ophthalmology) Ihor Gully, MD (Inactive) as  Referring Physician (Urology) Renaldo Fiddler, MD as Consulting Physician (Pain Medicine) Dr. Debby Bud, DDS as Consulting Physician (Dentistry) Rachel Moulds, MD as Consulting Physician (Hematology and Oncology) Dr. Marlana Latus (oncology, since Dr. Darnelle Catalan retired)  Depression Screening: Flowsheet Row Office Visit from 09/16/2022 in [facility]  PHQ-2 Total Score 0        Falls screen:     09/16/2022    9:54 AM 09/12/2021    8:43 AM 09/11/2020    8:29 AM 09/06/2019    8:39 AM 09/03/2018    8:42 AM  Fall Risk   Falls in the past year? 0 0 0 0 0  Number falls in past yr: 0 0     Injury with Fall? 0 0     Risk for fall due to : No Fall Risks No Fall Risks     Follow up Falls evaluation completed Falls evaluation completed        Functional Status Survey:         End of Life Discussion:  Patient does not have a living will and medical power of attorney. She was given forms in the past, filled them out, but hasn't gotten them notarized yet.  They are still sitting at home.   PMH, PSH, SH and FH reviewed and updated    ROS: The patient denies anorexia, fever, decreased hearing, ear pain, sore throat, breast concerns, chest pain, palpitations, dizziness, syncope, dyspnea on exertion, swelling, nausea, vomiting, melena, hematochezia, hematuria, incontinence, dysuria, vaginal bleeding, discharge, odor or itch, genital lesions, numbness, tingling, weakness, tremor, suspicious skin lesions, depression, anxiety, abnormal bleeding/bruising, or enlarged lymph nodes.  Hemorrhoids periodically flare (s/p banding, recurred).  Very rarely bleeds.  L buttock pain per HPI.  No numbness, tingling, weakness or radiation into leg.  Neck pain, mild and chronic (left-sided). Described more as "tension", not really pain. Carpal tunnel syndrome is sporadic, on the left, mild/tolerable.  R improved, s/p surgery. Minimal hot flashes since stopping Tamoxifen. Psoriasis is not currently  flaring. Denies vision changes; was noted to have start of macular degeneration.   PHYSICAL EXAM:  There were no vitals taken for this visit.  Wt Readings from Last 3 Encounters:  04/30/23 171 lb 3.2 oz (77.7 kg)  03/19/23 171 lb 6.4 oz (77.7 kg)  01/23/23 167 lb 12.8 oz (76.1 kg)   General Appearance:    Alert, cooperative, no distress, appears stated age. She declined changing into a gown  Head:    Normocephalic, without obvious abnormality, atraumatic     Eyes:    PERRL, conjunctiva/corneas clear, EOM's intact, fundi  benign     Ears:    Normal TM's and external ear canals     Nose:    No drainage or sinus tenderness  Throat:    Normal mucosa  Neck:    Supple, no lymphadenopathy; thyroid: no enlargement/ tenderness/nodules; no carotid bruit or JVD     Back:    Spine nontender, no curvature, ROM normal, no CVA tenderness. No SI tenderness  Lungs:    Clear to auscultation bilaterally without wheezes, rales or ronchi; respirations unlabored     Chest Wall:    No tenderness or deformity.   Heart:    Regular rate and rhythm, S1 and S2 normal, no murmur, rub or gallop     Breast Exam:    Deferred to GYN     Abdomen:    Soft, nondistended, normoactive bowel sounds, no masses, no hepatosplenomegaly.   Genitalia:    Deferred to GYN     Extremities:    No clubbing, cyanosis or edema.  Pulses:    2+ and symmetric all extremities     Skin:    Normal turgor. Exam is limited due to not changing into gown.  Lymph nodes:    Cervical, supraclavicular nodes normal     Neurologic:    Normal strength, sensation and gait; reflexes 2+ and symmetric throughout                     Psych:    Normal mood, affect, hygiene and grooming   ***UPDATE--if changed into gown (didn't last year)    ASSESSMENT/PLAN:  Offer/decline covid vaccine. Did she get RSV from pharmacy? Shingrix?  Need to verify fish oil dose (how much per capsule) Did she ever increase rosuvastatin as recommended after November  lipids? (To gradually double up the every other day doses to 2 pills)  Sees GYN q2 year   Discussed monthly self breast exams and yearly mammograms; at least 30 minutes of aerobic activity at least 5 days/week and weight-bearing exercise 2x/week; proper sunscreen use reviewed; healthy diet, including goals of calcium and vitamin D intake and alcohol recommendations (less than or equal to 1 drink/day) reviewed; regular seatbelt use; changing batteries in smoke detectors.   Immunization recommendations discussed--continue yearly high dose flu shots. COVID vaccine recommended, declined. Shingrix is recommended; risks/SE reviewed, to get from pharmacy.  RSV vaccine discussed, to get from pharmacy. Colonoscopy recommendations reviewed, UTD   MOST form reviewed and updated. Full Code, Full care. Reminded to get the Living Will and healthcare POA notarized and to get [place] copies..    F/u 1 year, sooner prn or based on lab results.   Medicare Attestation I have personally reviewed: The patient's medical and social history Their use of alcohol, tobacco or illicit drugs Their current medications and supplements The patient's functional ability including ADLs,fall risks, home safety risks, cognitive, and hearing and visual impairment Diet and physical activities Evidence for depression or mood disorders  The patient's weight, height, BMI have been recorded in the chart.  I have made referrals, counseling, and provided education to the patient based on review of the above and I have provided the patient with a written personalized care plan for preventive services.

## 2023-09-21 NOTE — Patient Instructions (Incomplete)
HEALTH MAINTENANCE RECOMMENDATIONS:  It is recommended that you get at least 30 minutes of aerobic exercise at least 5 days/week (for weight loss, you may need as much as 60-90 minutes). This can be any activity that gets your heart rate up. This can be divided in 10-15 minute intervals if needed, but try and build up your endurance at least once a week.  Weight bearing exercise is also recommended twice weekly.  Eat a healthy diet with lots of vegetables, fruits and fiber.  "Colorful" foods have a lot of vitamins (ie green vegetables, tomatoes, red peppers, etc).  Limit sweet tea, regular sodas and alcoholic beverages, all of which has a lot of calories and sugar.  Up to 1 alcoholic drink daily may be beneficial for women (unless trying to lose weight, watch sugars).  Drink a lot of water.  Calcium recommendations are 1200-1500 mg daily (1500 mg for postmenopausal women or women without ovaries), and vitamin D 1000 IU daily.  This should be obtained from diet and/or supplements (vitamins), and calcium should not be taken all at once, but in divided doses.  Monthly self breast exams and yearly mammograms for women over the age of 14 is recommended.  Sunscreen of at least SPF 30 should be used on all sun-exposed parts of the skin when outside between the hours of 10 am and 4 pm (not just when at beach or pool, but even with exercise, golf, tennis, and yard work!)  Use a sunscreen that says "broad spectrum" so it covers both UVA and UVB rays, and make sure to reapply every 1-2 hours.  Remember to change the batteries in your smoke detectors when changing your clock times in the spring and fall. Carbon monoxide detectors are recommended for your home.  Use your seat belt every time you are in a car, and please drive safely and not be distracted with cell phones and texting while driving.   Ms. Better , Thank you for taking time to come for your Medicare Wellness Visit. I appreciate your ongoing  commitment to your health goals. Please review the following plan we discussed and let me know if I can assist you in the future.   This is a list of the screening recommended for you and due dates:  Health Maintenance  Topic Date Due   Zoster (Shingles) Vaccine (1 of 2) 08/17/1965   COVID-19 Vaccine (4 - 2024-25 season) 10/04/2023*   Mammogram  03/16/2024   Medicare Annual Wellness Visit  09/21/2024   Colon Cancer Screening  12/25/2027   DTaP/Tdap/Td vaccine (4 - Td or Tdap) 08/17/2033   Pneumonia Vaccine  Completed   Flu Shot  Completed   DEXA scan (bone density measurement)  Completed   Hepatitis C Screening  Completed   HPV Vaccine  Aged Out  *Topic was postponed. The date shown is not the original due date.    I recommend getting the new shingles vaccine (Shingrix). Since you have Medicare, you will need to get this from the pharmacy, as it is covered by Part D. This is a series of 2 injections, spaced 2 months apart.   This should be separated from other vaccines by at least 2 weeks.  RSV vaccine is recommended. You need to get this from the pharmacy.  I recommend getting this NOW (and wait 2 weeks before getting shingles vaccine)  We are in the midst of RSV season currently, and it is not too late.  This vaccines lasts for more than  1 season, so covers you for next year as well.  Please bring [place] copies of your Living Will and Healthcare Power of Attorney once completed and notarized, so that it can be scanned into your medical chart.

## 2023-09-22 ENCOUNTER — Encounter: Payer: Self-pay | Admitting: Family Medicine

## 2023-09-22 ENCOUNTER — Ambulatory Visit (INDEPENDENT_AMBULATORY_CARE_PROVIDER_SITE_OTHER): Payer: Medicare HMO | Admitting: Family Medicine

## 2023-09-22 VITALS — BP 130/78 | HR 76 | Temp 97.2°F | Ht 63.0 in | Wt 174.6 lb

## 2023-09-22 DIAGNOSIS — E039 Hypothyroidism, unspecified: Secondary | ICD-10-CM | POA: Diagnosis not present

## 2023-09-22 DIAGNOSIS — Z5181 Encounter for therapeutic drug level monitoring: Secondary | ICD-10-CM | POA: Diagnosis not present

## 2023-09-22 DIAGNOSIS — Z0000 Encounter for general adult medical examination without abnormal findings: Secondary | ICD-10-CM

## 2023-09-22 DIAGNOSIS — Z853 Personal history of malignant neoplasm of breast: Secondary | ICD-10-CM

## 2023-09-22 DIAGNOSIS — I10 Essential (primary) hypertension: Secondary | ICD-10-CM

## 2023-09-22 DIAGNOSIS — E782 Mixed hyperlipidemia: Secondary | ICD-10-CM

## 2023-09-22 DIAGNOSIS — R053 Chronic cough: Secondary | ICD-10-CM

## 2023-09-22 DIAGNOSIS — R82998 Other abnormal findings in urine: Secondary | ICD-10-CM | POA: Diagnosis not present

## 2023-09-22 DIAGNOSIS — R7301 Impaired fasting glucose: Secondary | ICD-10-CM

## 2023-09-22 DIAGNOSIS — R1030 Lower abdominal pain, unspecified: Secondary | ICD-10-CM | POA: Diagnosis not present

## 2023-09-22 DIAGNOSIS — K219 Gastro-esophageal reflux disease without esophagitis: Secondary | ICD-10-CM | POA: Diagnosis not present

## 2023-09-22 LAB — POCT URINALYSIS DIP (PROADVANTAGE DEVICE)
Bilirubin, UA: NEGATIVE
Blood, UA: NEGATIVE
Glucose, UA: NEGATIVE mg/dL
Ketones, POC UA: NEGATIVE mg/dL
Nitrite, UA: NEGATIVE
Protein Ur, POC: NEGATIVE mg/dL
Specific Gravity, Urine: 1.02
Urobilinogen, Ur: 0.2
pH, UA: 6 (ref 5.0–8.0)

## 2023-09-22 LAB — POCT GLYCOSYLATED HEMOGLOBIN (HGB A1C): Hemoglobin A1C: 5.4 % (ref 4.0–5.6)

## 2023-09-22 LAB — LIPID PANEL

## 2023-09-23 ENCOUNTER — Other Ambulatory Visit: Payer: Self-pay | Admitting: *Deleted

## 2023-09-23 ENCOUNTER — Encounter: Payer: Self-pay | Admitting: Family Medicine

## 2023-09-23 DIAGNOSIS — E039 Hypothyroidism, unspecified: Secondary | ICD-10-CM

## 2023-09-23 LAB — CBC WITH DIFFERENTIAL/PLATELET
Basophils Absolute: 0 10*3/uL (ref 0.0–0.2)
Basos: 1 %
EOS (ABSOLUTE): 0.1 10*3/uL (ref 0.0–0.4)
Eos: 2 %
Hematocrit: 41.4 % (ref 34.0–46.6)
Hemoglobin: 13.7 g/dL (ref 11.1–15.9)
Immature Grans (Abs): 0 10*3/uL (ref 0.0–0.1)
Immature Granulocytes: 0 %
Lymphocytes Absolute: 1.8 10*3/uL (ref 0.7–3.1)
Lymphs: 36 %
MCH: 31.7 pg (ref 26.6–33.0)
MCHC: 33.1 g/dL (ref 31.5–35.7)
MCV: 96 fL (ref 79–97)
Monocytes Absolute: 0.4 10*3/uL (ref 0.1–0.9)
Monocytes: 9 %
Neutrophils Absolute: 2.6 10*3/uL (ref 1.4–7.0)
Neutrophils: 52 %
Platelets: 190 10*3/uL (ref 150–450)
RBC: 4.32 x10E6/uL (ref 3.77–5.28)
RDW: 12.8 % (ref 11.7–15.4)
WBC: 5 10*3/uL (ref 3.4–10.8)

## 2023-09-23 LAB — COMPREHENSIVE METABOLIC PANEL WITH GFR
ALT: 20 [IU]/L (ref 0–32)
AST: 24 [IU]/L (ref 0–40)
Albumin: 4.5 g/dL (ref 3.8–4.8)
Alkaline Phosphatase: 79 [IU]/L (ref 44–121)
BUN/Creatinine Ratio: 17 (ref 12–28)
BUN: 14 mg/dL (ref 8–27)
Bilirubin Total: 0.5 mg/dL (ref 0.0–1.2)
CO2: 25 mmol/L (ref 20–29)
Calcium: 9.7 mg/dL (ref 8.7–10.3)
Chloride: 106 mmol/L (ref 96–106)
Creatinine, Ser: 0.81 mg/dL (ref 0.57–1.00)
Globulin, Total: 2.8 g/dL (ref 1.5–4.5)
Glucose: 101 mg/dL — ABNORMAL HIGH (ref 70–99)
Potassium: 4 mmol/L (ref 3.5–5.2)
Sodium: 144 mmol/L (ref 134–144)
Total Protein: 7.3 g/dL (ref 6.0–8.5)
eGFR: 76 mL/min/{1.73_m2}

## 2023-09-23 LAB — LIPID PANEL
Cholesterol, Total: 207 mg/dL — ABNORMAL HIGH (ref 100–199)
HDL: 59 mg/dL (ref >39)
LDL CALC COMMENT:: 3.5 ratio (ref 0.0–4.4)
LDL Chol Calc (NIH): 117 mg/dL — ABNORMAL HIGH (ref 0–99)
Triglycerides: 180 mg/dL — ABNORMAL HIGH (ref 0–149)
VLDL Cholesterol Cal: 31 mg/dL (ref 5–40)

## 2023-09-23 LAB — TSH: TSH: 5.25 u[IU]/mL — ABNORMAL HIGH (ref 0.450–4.500)

## 2023-09-23 MED ORDER — LEVOTHYROXINE SODIUM 88 MCG PO TABS: ORAL_TABLET | ORAL | 0 refills | Status: DC

## 2023-09-24 ENCOUNTER — Encounter: Payer: Self-pay | Admitting: Family Medicine

## 2023-09-24 LAB — CULTURE, URINE COMPREHENSIVE

## 2023-10-07 ENCOUNTER — Telehealth: Payer: Self-pay

## 2023-10-07 MED ORDER — ROSUVASTATIN CALCIUM 20 MG PO TABS: 20.0000 mg | ORAL_TABLET | ORAL | 1 refills | Status: DC

## 2023-10-07 NOTE — Telephone Encounter (Signed)
Faxed request for rosuvastatin 20 mg.

## 2023-10-07 NOTE — Telephone Encounter (Signed)
 Sent!

## 2023-10-16 ENCOUNTER — Other Ambulatory Visit: Payer: Self-pay | Admitting: Family Medicine

## 2023-10-16 DIAGNOSIS — I10 Essential (primary) hypertension: Secondary | ICD-10-CM

## 2023-10-28 DIAGNOSIS — L821 Other seborrheic keratosis: Secondary | ICD-10-CM | POA: Diagnosis not present

## 2023-10-28 DIAGNOSIS — D2262 Melanocytic nevi of left upper limb, including shoulder: Secondary | ICD-10-CM | POA: Diagnosis not present

## 2023-10-28 DIAGNOSIS — L400 Psoriasis vulgaris: Secondary | ICD-10-CM | POA: Diagnosis not present

## 2023-10-28 DIAGNOSIS — L309 Dermatitis, unspecified: Secondary | ICD-10-CM | POA: Diagnosis not present

## 2023-10-31 ENCOUNTER — Ambulatory Visit
Admission: RE | Admit: 2023-10-31 | Discharge: 2023-10-31 | Disposition: A | Discharge status: Home / Self Care | Source: Ambulatory Visit | Attending: Family Medicine | Admitting: Family Medicine

## 2023-10-31 VITALS — BP 138/80 | HR 71 | Temp 97.9°F | Resp 16

## 2023-10-31 DIAGNOSIS — N390 Urinary tract infection, site not specified: Secondary | ICD-10-CM | POA: Insufficient documentation

## 2023-10-31 LAB — POCT URINALYSIS DIP (MANUAL ENTRY)
Bilirubin, UA: NEGATIVE
Glucose, UA: NEGATIVE mg/dL
Ketones, POC UA: NEGATIVE mg/dL
Nitrite, UA: NEGATIVE
Spec Grav, UA: 1.015 (ref 1.010–1.025)
Urobilinogen, UA: 0.2 U/dL
pH, UA: 6.5 (ref 5.0–8.0)

## 2023-10-31 MED ORDER — NITROFURANTOIN MONOHYD MACRO 100 MG PO CAPS: 100.0000 mg | ORAL_CAPSULE | Freq: Two times a day (BID) | ORAL | 0 refills | Status: DC

## 2023-10-31 NOTE — ED Provider Notes (Signed)
 [facility]    CSN: [id number] Arrival date & time: 10/31/23  1800      History   Chief Complaint Chief Complaint  Patient presents with   Urinary Frequency    Painful - Entered by patient    HPI Mckenzie Webb is a 78 y.o. female.   Presenting today with 2-day history of urinary frequency, dysuria, lower back aching.  Denies fever, chills, nausea, vomiting, hematuria.  So far trying over-the-counter Azo with minimal relief.    Past Medical History:  Diagnosis Date   Arthritis    lower back   Cough 07/22/2017   Dental crowns present    Family history of adverse reaction to anesthesia    pt's sister has hx. of post-op N/V   GERD (gastroesophageal reflux disease)    History of chemotherapy    finished chemo 06/30/2017   History of radiation therapy 07/29/17- 08/28/17   Right Breast and Axilla treated to 42.56 Gy with 16 fx of 2.66. Boost of 8 Gy with 4 fx of 2 Gy.    History of right breast cancer 02/2017   Hyperlipidemia    Hypothyroid    Leukopenia 07/07/2017   Personal history of chemotherapy    Personal history of radiation therapy    Stuffy and runny nose 07/22/2017   clear drainage from nose, per pt.    Patient Active Problem List   Diagnosis Date Noted   Personal history of breast cancer 03/19/2023   Chronic midline low back pain with left-sided sciatica 03/19/2023   Mixed hyperlipidemia 09/17/2022   Senile purpura (HCC) 03/10/2020   Plant dermatitis 12/13/2019   Hot flashes due to tamoxifen 09/06/2019   Morbid exogenous obesity (HCC) 09/28/2017   Port catheter in place 04/28/2017   Genetic testing 04/17/2017   Family history of pancreatic cancer    Family history of colon cancer    Malignant neoplasm of upper-inner quadrant of right breast in female, estrogen receptor positive (HCC) 03/11/2017   Essential hypertension, benign 07/31/2015   IFG (impaired fasting glucose) 05/03/2015   Psoriasis 05/03/2014   Greater trochanteric bursitis  of right hip 03/14/2014   Obesity (BMI 30-39.9) 05/26/2013   GERD (gastroesophageal reflux disease) 12/31/2010   Hypothyroidism 12/31/2010   Vitamin D deficiency 12/31/2010   Pure hypercholesterolemia 12/31/2010    Past Surgical History:  Procedure Laterality Date   BREAST BIOPSY Right 03/31/2023   [place] RT BREAST BX W LOC DEV 1ST LESION IMG BX SPEC US GUIDE 03/31/2023 [facility]   BREAST LUMPECTOMY Right 03/26/2017   BREAST LUMPECTOMY WITH RADIOACTIVE SEED AND SENTINEL LYMPH NODE BIOPSY Right 03/26/2017   Procedure: BREAST LUMPECTOMY WITH RADIOACTIVE SEED AND SENTINEL LYMPH NODE BIOPSY;  Surgeon: Griselda Miner, MD;  Location: [facility];  Service: General;  Laterality: Right;   CARPAL TUNNEL RELEASE Right 01/05/2021   R CTR with RMF trigger release (Dr. Amanda Pea)   CATARACT EXTRACTION Left 2021   CHOLECYSTECTOMY     COLONOSCOPY  10/2007   CYST EXCISION  02/01/2003   knee   KNEE ARTHROSCOPY Right 1990   KNEE ARTHROSCOPY Left 12/12/2010   PORT-A-CATH REMOVAL N/A 09/03/2017   Procedure: REMOVAL PORT-A-CATH;  Surgeon: Griselda Miner, MD;  Location: [facility];  Service: General;  Laterality: N/A;   PORTACATH PLACEMENT Left 04/24/2017   Procedure: INSERTION PORT-A-CATH;  Surgeon: Griselda Miner, MD;  Location: [facility];  Service: General;  Laterality: Left;   TUBAL LIGATION  1983   VARICOSE  VEIN SURGERY  09/2009 R, 06/2010 L    OB History     Gravida  2   Para  2   Term      Preterm      AB      Living  2      SAB      IAB      Ectopic      Multiple      Live Births               Home Medications    Prior to Admission medications   Medication Sig Start Date End Date Taking? Authorizing Provider  nitrofurantoin, macrocrystal-monohydrate, (MACROBID) 100 MG capsule Take 1 capsule (100 mg total) by mouth 2 (two) times daily. 10/31/23  Yes Particia Nearing, PA-C  acetaminophen (TYLENOL) 500 MG tablet Take 1,000 mg by mouth  every 6 (six) hours as needed. Patient not taking: Reported on 09/22/2023    [provider]  calcium-vitamin D (OSCAL WITH D) 500-200 MG-UNIT tablet Take 1 tablet by mouth daily.    [provider]  clobetasol cream (TEMOVATE) 0.05 % Apply topically 2 (two) times daily as needed. Patient not taking: Reported on 09/22/2023 04/27/20   Joselyn Arrow, MD  Coenzyme Q10 (CO Q-10) 100 MG CAPS Take by mouth daily.    [provider]  Dextromethorphan-guaiFENesin (MUCINEX DM MAXIMUM STRENGTH) 60-1200 MG TB12 Take 1 tablet by mouth in the morning and at bedtime. Patient not taking: Reported on 09/22/2023    [provider]  famotidine (PEPCID) 40 MG tablet Take 40 mg by mouth 2 (two) times daily. 02/04/23   [provider]  fluticasone (FLONASE) 50 MCG/ACT nasal spray Place 1 spray into both nostrils 2 (two) times daily. Patient not taking: Reported on 09/22/2023 04/17/23   Particia Nearing, PA-C  hydrochlorothiazide (MICROZIDE) 12.5 MG capsule TAKE 1 CAPSULE BY MOUTH EVERY DAY 10/16/23   Joselyn Arrow, MD  levothyroxine (SYNTHROID) 88 MCG tablet Take 1 tablet by mouth daily x 6 days, and 1.5 tablets once/week on Sundays 09/23/23   Joselyn Arrow, MD  loratadine (CLARITIN) 10 MG tablet Take 10 mg by mouth daily as needed for allergies.    [provider]  Multiple Vitamins-Minerals (ALIVE WOMENS 50+ PO) Take 1 tablet by mouth daily.     [provider]  Multiple Vitamins-Minerals (PRESERVISION AREDS 2 PO) Take 1 tablet by mouth in the morning and at bedtime.    [provider]  Omega-3 Fatty Acids (FISH OIL) 1000 MG CAPS Take 3,000 mg by mouth daily.    [provider]  Probiotic Product (ALIGN PO) Take 1 capsule by mouth daily.    [provider]  rosuvastatin (CRESTOR) 20 MG tablet Take 1 tablet (20 mg total) by mouth every other day. 10/07/23   Joselyn Arrow, MD    Family History Family History  Problem Relation Age of Onset    Hypertension Mother    Heart disease Mother    Gallbladder disease Mother    Thyroid disease Mother    Arthritis Mother    Diabetes Father    Cancer Father        pancreatic   Stroke Sister 6       has since also had seizures   Hypertension Sister    Gallbladder disease Sister    Diabetes Sister    Diverticulitis Sister        of small intestine   COPD Sister  Stroke Sister        late 50's   Hypertension Sister    Pulmonary embolism Sister    Ulcerative colitis Sister    COPD Brother        had lung lobe removed for suspected cancer, but was benign; smoker   AAA (abdominal aortic aneurysm) Brother    Emphysema Brother    Diabetes Son        great toes amputated at 40   Cancer Son        testicular cancer   Lung cancer Cousin        maternal first cousin - smoker   Colon cancer Other 41    Social History Social History   Tobacco Use   Smoking status: Never   Smokeless tobacco: Never  Vaping Use   Vaping status: Never Used  Substance Use Topics   Alcohol use: Not Currently    Comment: none since her cancer diagnosis 2018; rare margarita   Drug use: No     Allergies   Adhesive [tape], Ciprofloxacin, Codeine, Iodine, Polysporin [bacitracin-polymyxin b], Penicillins, and Sulfa antibiotics   Review of Systems Review of Systems Per HPI  Physical Exam Triage Vital Signs ED Triage Vitals  Encounter Vitals Group     BP 10/31/23 1826 138/80     Systolic BP Percentile --      Diastolic BP Percentile --      Pulse Rate 10/31/23 1826 71     Resp 10/31/23 1826 16     Temp 10/31/23 1826 97.9 F (36.6 C)     Temp Source 10/31/23 1826 Oral     SpO2 10/31/23 1826 96 %     Weight --      Height --      Head Circumference --      Peak Flow --      Pain Score 10/31/23 1825 7     Pain Loc --      Pain Education --      Exclude from Growth Chart --    No data found.  Updated Vital Signs BP 138/80 (BP Location: Right Arm)   Pulse 71   Temp 97.9 F (36.6  C) (Oral)   Resp 16   SpO2 96%   Visual Acuity Right Eye Distance:   Left Eye Distance:   Bilateral Distance:    Right Eye Near:   Left Eye Near:    Bilateral Near:     Physical Exam Vitals and nursing note reviewed.  Constitutional:      Appearance: Normal appearance. She is not ill-appearing.  HENT:     Head: Atraumatic.  Eyes:     Extraocular Movements: Extraocular movements intact.     Conjunctiva/sclera: Conjunctivae normal.  Cardiovascular:     Rate and Rhythm: Normal rate.  Pulmonary:     Effort: Pulmonary effort is normal.  Abdominal:     General: Bowel sounds are normal. There is no distension.     Palpations: Abdomen is soft.     Tenderness: There is no abdominal tenderness. There is no right CVA tenderness, left CVA tenderness or guarding.  Musculoskeletal:        General: Normal range of motion.     Cervical back: Normal range of motion and neck supple.  Skin:    General: Skin is warm and dry.  Neurological:     Mental Status: She is alert and oriented to person, place, and time.  Psychiatric:  Mood and Affect: Mood normal.        Thought Content: Thought content normal.        Judgment: Judgment normal.      UC Treatments / Results  Labs (all labs ordered are listed, but only abnormal results are displayed) Labs Reviewed  POCT URINALYSIS DIP (MANUAL ENTRY) - Abnormal; Notable for the following components:      Result Value   Blood, UA small (*)    Protein Ur, POC trace (*)    Leukocytes, UA Large (3+) (*)    All other components within normal limits  URINE CULTURE    EKG   Radiology No results found.  Procedures Procedures (including critical care time)  Medications Ordered in UC Medications - No data to display  Initial Impression / Assessment and Plan / UC Course  I have reviewed the triage vital signs and the nursing notes.  Pertinent labs & imaging results that were available during my care of the patient were reviewed  by me and considered in my medical decision making (see chart for details).     Urinalysis today with evidence of urinary tract infection.  Urine culture pending, treat with Macrobid, fluids, Azo as needed.  Return for worsening symptoms.  Final Clinical Impressions(s) / UC Diagnoses   Final diagnoses:  Acute lower UTI   Discharge Instructions   None    ED Prescriptions     Medication Sig Dispense Auth. Provider   nitrofurantoin, macrocrystal-monohydrate, (MACROBID) 100 MG capsule Take 1 capsule (100 mg total) by mouth 2 (two) times daily. 10 capsule Particia Nearing, [place]      PDMP not reviewed this encounter.   Particia Nearing, [place] 10/31/23 1850

## 2023-10-31 NOTE — ED Triage Notes (Signed)
 Pt reports she has some frequent urination, pain with urination, and low back pain x 2 days   Took some urinary relief.

## 2023-11-02 LAB — URINE CULTURE: Culture: 10000 — AB

## 2023-11-10 ENCOUNTER — Other Ambulatory Visit: Payer: No Typology Code available for payment source

## 2023-11-10 DIAGNOSIS — E039 Hypothyroidism, unspecified: Secondary | ICD-10-CM | POA: Diagnosis not present

## 2023-11-11 ENCOUNTER — Encounter: Payer: Self-pay | Admitting: Family Medicine

## 2023-11-11 LAB — TSH: TSH: 0.606 u[IU]/mL (ref 0.450–4.500)

## 2023-12-18 ENCOUNTER — Ambulatory Visit: Payer: Self-pay

## 2023-12-18 NOTE — Telephone Encounter (Signed)
 Called pt offered her the cancellation this morning.  She lives in [place], she will keep tomorrows appt.

## 2023-12-18 NOTE — Telephone Encounter (Signed)
 Chief Complaint: urinary pain Symptoms: painful urination, urgency and back pain Frequency: since tuesday Pertinent Negatives: Patient denies fever Disposition: [] ED /[] Urgent Care (no appt availability in office) / [x] Appointment(In office/virtual)/ []  [facility]/ [] Home Care/ [] Refused Recommended Disposition /[] [facility]/ []  Follow-up with PCP Additional Notes: pt states pain with urination and back pain. States she is taking AZO a couple days and it helped some but not completely.  States this started on Tuesday. States burning 8/10.    Copied from CRM [id number]. Topic: Clinical - Red Word Triage >> Dec 18, 2023  9:06 AM Mckenzie Webb wrote: Red Word that prompted transfer to Nurse Triage: Patient experiencing pain and urgency to urinate. Transferred to a nurse. Reason for Disposition  > 2 UTI's in last year  Answer Assessment - Initial Assessment Questions 1. SEVERITY: "How bad is the pain?"  (e.g., Scale 1-10; mild, moderate, or severe)   - MILD (1-3): complains slightly about urination hurting   - MODERATE (4-7): interferes with normal activities     - SEVERE (8-10): excruciating, unwilling or unable to urinate because of the pain      8/10 2. FREQUENCY: "How many times have you had painful urination today?"      4 3. PATTERN: "Is pain present every time you urinate or just sometimes?"      Every time 4. ONSET: "When did the painful urination start?"      tuesday 5. FEVER: "Do you have a fever?" If Yes, ask: "What is your temperature, how was it measured, and when did it start?"     no 6. PAST UTI: "Have you had a urine infection before?" If Yes, ask: "When was the last time?" and "What happened that time?"      1-2 months ago 7. CAUSE: "What do you think is causing the painful urination?"  (e.g., UTI, scratch, Herpes sore)     uti 8. OTHER SYMPTOMS: "Do you have any other symptoms?" (e.g., blood in urine, flank pain, genital sores, urgency, vaginal  discharge)     Back pain and urgency  Protocols used: Urination Pain - Female-A-AH

## 2023-12-19 ENCOUNTER — Ambulatory Visit (INDEPENDENT_AMBULATORY_CARE_PROVIDER_SITE_OTHER): Admitting: Medical

## 2023-12-19 ENCOUNTER — Encounter: Payer: Self-pay | Admitting: Medical

## 2023-12-19 VITALS — BP 120/80 | HR 74 | Wt 162.4 lb

## 2023-12-19 DIAGNOSIS — R300 Dysuria: Secondary | ICD-10-CM

## 2023-12-19 DIAGNOSIS — N3000 Acute cystitis without hematuria: Secondary | ICD-10-CM

## 2023-12-19 LAB — POCT URINALYSIS DIP (PROADVANTAGE DEVICE)
Bilirubin, UA: NEGATIVE
Blood, UA: NEGATIVE
Glucose, UA: NEGATIVE mg/dL
Ketones, POC UA: NEGATIVE mg/dL
Nitrite, UA: NEGATIVE
Protein Ur, POC: 30 mg/dL — AB
Specific Gravity, Urine: 1.01
Urobilinogen, Ur: 0.2
pH, UA: 6 (ref 5.0–8.0)

## 2023-12-19 MED ORDER — NITROFURANTOIN MONOHYD MACRO 100 MG PO CAPS: 100.0000 mg | ORAL_CAPSULE | Freq: Two times a day (BID) | ORAL | 0 refills | Status: DC

## 2023-12-19 NOTE — Addendum Note (Signed)
 Addended by: Eddye Goodie on: 12/19/2023 12:25 PM   Modules accepted: Orders

## 2023-12-19 NOTE — Progress Notes (Signed)
 Subjective:  Mckenzie Webb is a 78 y.o. female who presents for Chief Complaint  Patient presents with   Acute Visit    Urinary pain. Urgency since Tuesday. Low abdominal pain.      Here for complaint of urinary tract infection.  she notes burning with urination, lower abdominal discomfort, urine frequency x 5 days.  No fever, no body ache or chills.  No blood in urine.  Urine is cloudy.  No odor.    No NVD.   No vaginal discharge.   Using azo OTC.  Last UTI 09/2023.  Urine culture was positive for E. coli at that time and she did get better with Macrobid  antibiotic.  She has several drug allergies including Cipro  and penicillin and Septra.  She denies vaginal complaint, no irritation.  She did have a question about has been using Viagra.  He itches when he takes the medication to she was not sure if that would cause any problem with her.  She thinks he may have a little bit of an allergy to the medication.  No other aggravating or relieving factors.    No other c/o.  Past Medical History:  Diagnosis Date   Arthritis    lower back   Cough 07/22/2017   Dental crowns present    Family history of adverse reaction to anesthesia    pt's sister has hx. of post-op N/V   GERD (gastroesophageal reflux disease)    History of chemotherapy    finished chemo 06/30/2017   History of radiation therapy 07/29/17- 08/28/17   Right Breast and Axilla treated to 42.56 Gy with 16 fx of 2.66. Boost of 8 Gy with 4 fx of 2 Gy.    History of right breast cancer 02/2017   Hyperlipidemia    Hypothyroid    Leukopenia 07/07/2017   Personal history of chemotherapy    Personal history of radiation therapy    Stuffy and runny nose 07/22/2017   clear drainage from nose, per pt.   Current Outpatient Medications on File Prior to Visit  Medication Sig Dispense Refill   acetaminophen  (TYLENOL ) 500 MG tablet Take 1,000 mg by mouth every 6 (six) hours as needed.     calcium -vitamin D  (OSCAL WITH D) 500-200  MG-UNIT tablet Take 1 tablet by mouth daily.     clobetasol  cream (TEMOVATE ) 0.05 % Apply topically 2 (two) times daily as needed. 60 g 0   Coenzyme Q10 (CO Q-10) 100 MG CAPS Take by mouth daily.     Dextromethorphan-guaiFENesin (MUCINEX DM MAXIMUM STRENGTH) 60-1200 MG TB12 Take 1 tablet by mouth in the morning and at bedtime.     famotidine (PEPCID) 40 MG tablet Take 40 mg by mouth 2 (two) times daily.     hydrochlorothiazide  (MICROZIDE ) 12.5 MG capsule TAKE 1 CAPSULE BY MOUTH EVERY DAY 90 capsule 0   levothyroxine  (SYNTHROID ) 88 MCG tablet Take 1 tablet by mouth daily x 6 days, and 1.5 tablets once/week on Sundays 96 tablet 0   loratadine (CLARITIN) 10 MG tablet Take 10 mg by mouth daily as needed for allergies.     Multiple Vitamins-Minerals (ALIVE WOMENS 50+ PO) Take 1 tablet by mouth daily.      Multiple Vitamins-Minerals (PRESERVISION AREDS 2 PO) Take 1 tablet by mouth in the morning and at bedtime.     Omega-3 Fatty Acids (FISH OIL) 1000 MG CAPS Take 3,000 mg by mouth daily.     Probiotic Product (ALIGN PO) Take 1 capsule by mouth daily.  rosuvastatin  (CRESTOR ) 20 MG tablet Take 1 tablet (20 mg total) by mouth every other day. 45 tablet 1   fluticasone  (FLONASE ) 50 MCG/ACT nasal spray Place 1 spray into both nostrils 2 (two) times daily. (Patient not taking: Reported on 12/19/2023) 16 g 2   No current facility-administered medications on file prior to visit.    The following portions of the patient's history were reviewed and updated as appropriate: allergies, current medications, past family history, past medical history, past social history, past surgical history and problem list.  ROS Otherwise as in subjective above  Objective: BP 120/80   Pulse 74   Wt 162 lb 6.4 oz (73.7 kg)   SpO2 97%   BMI 28.77 kg/m   General appearance: alert, no distress, well developed, well nourished Abdomen: +bs, soft, non tender, non distended, no masses, no hepatomegaly, no splenomegaly Back  nontender Pulses: 2+ radial pulses, 2+ pedal pulses, normal cap refill Ext: no edema   Assessment: Encounter Diagnoses  Name Primary?   Dysuria Yes   Acute cystitis without hematuria      Plan: Continue with good water intake.  Begin Macrobid .  Await culture.  She can continue Azo, Tylenol  for pain  We discussed her recent urinary tract infection.  I reviewed the culture results.  I advised that she gets another urinary tract infection within the next few months back to back again and she needs to talk with her PCP about other potential evaluation.    Mckenzie Webb "Mckenzie Webb" was seen today for acute visit.  Diagnoses and all orders for this visit:  Dysuria -     Urine Culture  Acute cystitis without hematuria  Other orders -     nitrofurantoin , macrocrystal-monohydrate, (MACROBID ) 100 MG capsule; Take 1 capsule (100 mg total) by mouth 2 (two) times daily.    Follow up: pending culture

## 2023-12-23 LAB — URINE CULTURE

## 2023-12-23 NOTE — Progress Notes (Signed)
 Results sent through MyChart

## 2024-01-09 ENCOUNTER — Other Ambulatory Visit: Payer: Self-pay | Admitting: Family Medicine

## 2024-01-09 DIAGNOSIS — I10 Essential (primary) hypertension: Secondary | ICD-10-CM

## 2024-01-26 DIAGNOSIS — M79641 Pain in right hand: Secondary | ICD-10-CM | POA: Diagnosis not present

## 2024-01-26 DIAGNOSIS — M19031 Primary osteoarthritis, right wrist: Secondary | ICD-10-CM | POA: Diagnosis not present

## 2024-01-26 DIAGNOSIS — S63591A Other specified sprain of right wrist, initial encounter: Secondary | ICD-10-CM | POA: Diagnosis not present

## 2024-01-26 DIAGNOSIS — M65941 Unspecified synovitis and tenosynovitis, right hand: Secondary | ICD-10-CM | POA: Diagnosis not present

## 2024-02-03 DIAGNOSIS — H43813 Vitreous degeneration, bilateral: Secondary | ICD-10-CM | POA: Diagnosis not present

## 2024-02-03 DIAGNOSIS — H2511 Age-related nuclear cataract, right eye: Secondary | ICD-10-CM | POA: Diagnosis not present

## 2024-02-03 DIAGNOSIS — H40033 Anatomical narrow angle, bilateral: Secondary | ICD-10-CM | POA: Diagnosis not present

## 2024-02-03 DIAGNOSIS — H353131 Nonexudative age-related macular degeneration, bilateral, early dry stage: Secondary | ICD-10-CM | POA: Diagnosis not present

## 2024-02-03 DIAGNOSIS — D3131 Benign neoplasm of right choroid: Secondary | ICD-10-CM | POA: Diagnosis not present

## 2024-02-03 DIAGNOSIS — Z961 Presence of intraocular lens: Secondary | ICD-10-CM | POA: Diagnosis not present

## 2024-02-10 ENCOUNTER — Other Ambulatory Visit: Payer: Self-pay | Admitting: Hematology and Oncology

## 2024-02-10 DIAGNOSIS — Z1231 Encounter for screening mammogram for malignant neoplasm of breast: Secondary | ICD-10-CM

## 2024-02-25 ENCOUNTER — Other Ambulatory Visit: Payer: Self-pay | Admitting: Family Medicine

## 2024-02-25 DIAGNOSIS — E039 Hypothyroidism, unspecified: Secondary | ICD-10-CM

## 2024-03-12 ENCOUNTER — Ambulatory Visit: Admitting: Nurse Practitioner

## 2024-03-12 ENCOUNTER — Encounter: Payer: Self-pay | Admitting: Nurse Practitioner

## 2024-03-12 VITALS — BP 132/82 | HR 88 | Wt 161.0 lb

## 2024-03-12 DIAGNOSIS — N3090 Cystitis, unspecified without hematuria: Secondary | ICD-10-CM | POA: Diagnosis not present

## 2024-03-12 DIAGNOSIS — R8290 Unspecified abnormal findings in urine: Secondary | ICD-10-CM

## 2024-03-12 DIAGNOSIS — R350 Frequency of micturition: Secondary | ICD-10-CM | POA: Diagnosis not present

## 2024-03-12 DIAGNOSIS — N898 Other specified noninflammatory disorders of vagina: Secondary | ICD-10-CM

## 2024-03-12 LAB — POCT URINALYSIS DIP (CLINITEK)
Bilirubin, UA: NEGATIVE
Blood, UA: NEGATIVE
Glucose, UA: NEGATIVE mg/dL
Ketones, POC UA: NEGATIVE mg/dL
Nitrite, UA: NEGATIVE
Spec Grav, UA: 1.01 (ref 1.010–1.025)
Urobilinogen, UA: 0.2 U/dL
pH, UA: 7 (ref 5.0–8.0)

## 2024-03-12 MED ORDER — FLUCONAZOLE 150 MG PO TABS: ORAL_TABLET | ORAL | 0 refills | Status: DC

## 2024-03-12 MED ORDER — NITROFURANTOIN MONOHYD MACRO 100 MG PO CAPS
100.0000 mg | ORAL_CAPSULE | Freq: Two times a day (BID) | ORAL | 0 refills | Status: DC
Start: 2024-03-12 — End: 2024-03-24

## 2024-03-12 NOTE — Patient Instructions (Signed)

## 2024-03-12 NOTE — Progress Notes (Signed)
 Camie FORBES Doing, DNP, AGNP-c [facility] [address] [place], [place] [address] [phone]   ACUTE VISIT- ESTABLISHED PATIENT  Blood pressure 132/82, pulse 88, weight 161 lb (73 kg).  Subjective:  HPI History of Present Illness Mckenzie Webb is a 78 year old female who presents with urinary symptoms and possible vaginal yeast infection.  She has been experiencing urinary symptoms, including burning and odor, for over a week. She suspects a urinary tract infection due to her history of UTIs. She drinks water regularly to stay hydrated.  She also describes vaginal symptoms that she initially thought might be a yeast infection. She used Monistat One a week ago, which caused significant burning but did not resolve her symptoms. She noticed a purplish-blue area on the medial portion of the left labia minora, which is causing her concern earlier today. She has not had a yeast infection in years and was surprised by the current symptoms. She uses a daily wash for vaginal hygiene and has not had vaginal problems for a long time.   ROS negative except for what is listed in HPI. History, Medications, Surgery, SDOH, and Family History reviewed and updated as appropriate.  Objective:  Physical Exam Vitals and nursing note reviewed.  Constitutional:      General: She is not in acute distress.    Appearance: Normal appearance. She is not ill-appearing.  HENT:     Head: Normocephalic.  Eyes:     Pupils: Pupils are equal, round, and reactive to light.  Cardiovascular:     Rate and Rhythm: Normal rate and regular rhythm.     Pulses: Normal pulses.     Heart sounds: Normal heart sounds.  Pulmonary:     Effort: Pulmonary effort is normal.  Abdominal:     Hernia: There is no hernia in the left inguinal area or right inguinal area.  Genitourinary:    Exam position: Lithotomy position.     Pubic Area: No rash or pubic lice.      Labia:        Right: No rash or  tenderness.        Left: No rash or tenderness.    Lymphadenopathy:     Lower Body: No right inguinal adenopathy. No left inguinal adenopathy.  Skin:    General: Skin is warm and dry.     Capillary Refill: Capillary refill takes less than 2 seconds.  Neurological:     Mental Status: She is alert and oriented to person, place, and time.  Psychiatric:        Mood and Affect: Mood normal.         Assessment & Plan:   Problem List Items Addressed This Visit     Cystitis - Primary   She presents with urinary symptoms including burning and odor, consistent with a UTI. Urinalysis confirmed signs of a UTI. The condition has been present for over a week. A urine culture will be sent to ensure the appropriate antibiotic is used. The UTI may have triggered a yeast infection. - Prescribe antibiotic for UTI - Send urine for culture to confirm appropriate antibiotic      Relevant Orders   POCT URINALYSIS DIP (CLINITEK) (Completed)   Urine Culture (Completed)   NuSwab BV and Candida, NAA (Completed)   Vaginal irritation   She used Monistat 1 for suspected yeast infection, which caused burning. A swab will be taken to check for yeast or bacterial vaginosis. We discussed she may be experiencing  vaginal atrophy. She has not had a yeast infection in years, but the UTI may have triggered it. Fluconazole  will be prescribed as a precautionary measure to address potential yeast infection. A blue vein is noted on exam on the medial labia minora of the left side. This appeared after using Monistat 1 for suspected yeast infection. The vein is likely more prominent due to skin thinning and hormonal effects caused by miconazole in Monistat 1, which can be common in some women. The vein is not abnormal and should resolve over time. There are no alarm symptoms present.  - Swab for yeast and bacterial vaginosis - Prescribe fluconazole  (Diflucan ) for yeast infection, with two doses: one today and one on the last  day of the antibiotic - Advise monitoring the vein for changes - Reassure that the vein is not a cause for concern - Consider topical aquaphor to the vaginal introitus for vaginal atrophy relief.       Relevant Medications   fluconazole  (DIFLUCAN ) 150 MG tablet   nitrofurantoin , macrocrystal-monohydrate, (MACROBID ) 100 MG capsule   Other Relevant Orders   NuSwab BV and Candida, NAA (Completed)   Other Visit Diagnoses       Abnormal urine odor       Relevant Orders   POCT URINALYSIS DIP (CLINITEK) (Completed)   Urine Culture (Completed)   NuSwab BV and Candida, NAA (Completed)     Frequent urination       Relevant Orders   POCT URINALYSIS DIP (CLINITEK) (Completed)   Urine Culture (Completed)   NuSwab BV and Candida, NAA (Completed)         Camie FORBES Doing, DNP, AGNP-c

## 2024-03-15 LAB — NUSWAB BV AND CANDIDA, NAA
Candida albicans, NAA: NEGATIVE
Candida glabrata, NAA: NEGATIVE

## 2024-03-15 LAB — URINE CULTURE

## 2024-03-15 NOTE — Assessment & Plan Note (Addendum)
 She used Monistat 1 for suspected yeast infection, which caused burning. A swab will be taken to check for yeast or bacterial vaginosis. We discussed she may be experiencing vaginal atrophy. She has not had a yeast infection in years, but the UTI may have triggered it. Fluconazole  will be prescribed as a precautionary measure to address potential yeast infection. A blue vein is noted on exam on the medial labia minora of the left side. This appeared after using Monistat 1 for suspected yeast infection. The vein is likely more prominent due to skin thinning and hormonal effects caused by miconazole in Monistat 1, which can be common in some women. The vein is not abnormal and should resolve over time. There are no alarm symptoms present.  - Swab for yeast and bacterial vaginosis - Prescribe fluconazole  (Diflucan ) for yeast infection, with two doses: one today and one on the last day of the antibiotic - Advise monitoring the vein for changes - Reassure that the vein is not a cause for concern - Consider topical aquaphor to the vaginal introitus for vaginal atrophy relief.

## 2024-03-15 NOTE — Assessment & Plan Note (Signed)
 She presents with urinary symptoms including burning and odor, consistent with a UTI. Urinalysis confirmed signs of a UTI. The condition has been present for over a week. A urine culture will be sent to ensure the appropriate antibiotic is used. The UTI may have triggered a yeast infection. - Prescribe antibiotic for UTI - Send urine for culture to confirm appropriate antibiotic

## 2024-03-17 ENCOUNTER — Ambulatory Visit: Payer: Self-pay | Admitting: Nurse Practitioner

## 2024-03-19 ENCOUNTER — Ambulatory Visit
Admission: RE | Admit: 2024-03-19 | Discharge: 2024-03-19 | Disposition: A | Discharge status: Home / Self Care | Source: Ambulatory Visit | Attending: Hematology and Oncology | Admitting: Hematology and Oncology

## 2024-03-19 DIAGNOSIS — Z1231 Encounter for screening mammogram for malignant neoplasm of breast: Secondary | ICD-10-CM

## 2024-03-22 ENCOUNTER — Ambulatory Visit: Admitting: Family Medicine

## 2024-03-23 NOTE — Progress Notes (Unsigned)
 No chief complaint on file.  Patient presents for 6 month follow-up on chronic problems.  Since her last physical, she has been seen by other providers in the office for UTI's on 2 occasions. Once in May, and once 7/25. She was treated with macrobid  on both occasions. Both cultures grew E.coli, both sensitive to macrobid . There was also concern for possible yeast infection at her recent visit (had already used Monistat OTC).  Nuswab was negative for yeast and BV. She was prescribed diflucan  to use prn since being prescribed ABX to treat the UTI.   Hypercholesterolemia:  She was compliant with taking Crestor  20mg  every other day (she had myalgias, weakness in her legs when it was taken daily, despite using coenzyme Q10; also previously didn't tolerate lipitor, achey in legs, felt like she couldn't move). She denies side effects. She is taking 3000 mg of fish oil (1000 in the morning, 2000 mg at night). Due for recheck.  TG remained elevated on last check, LDL was improved compared to the other checks in 2024.   Diet hasn't changed--bacon and eggs once a week, uses 2% milk in her cereal, and red meat 2x/week or less. She avoids fried foods.    She is having a mini regular soda once daily. She hasn't been having much tea. Use butter on toast and baked potatoes. Doesn't have much cheese.  Uses vinaigrette dressings, occ thousand island. Occ creamy soup (in casseroles).    Due for recheck today.  Component Ref Range & Units (hover) 6 mo ago (09/22/23) 9 mo ago (06/20/23) 1 yr ago (03/19/23) 1 yr ago (09/16/22) 2 yr ago (09/12/21) 3 yr ago (09/11/20) 4 yr ago (09/06/19)  Cholesterol, Total 207 High  213 High  228 High  223 High  196 193 176  Triglycerides 180 High  162 High  222 High  240 High  237 High  203 High  106  HDL 59 54 55 53 49 52 69  VLDL Cholesterol Cal 31 29 39 42 High  41 High  35 19  LDL Chol Calc (NIH) 117 High  130 High  134 High  128 High  106 High  106 High  88  Chol/HDL  Ratio 3.5 3.9 CM 4.1 CM 4.2 CM 4.0 CM 3.7 CM 2.6     Hypertension:  BP's are checked occasionally, and have been running 130/70-80. ***  She is compliant with HCTZ 12.5 mg, denies side effects. She eats bananas in her diet. She denies muscle cramps. Denies headaches, dizziness, chest pain, shortness of breath or edema. She reports her monitor was verified as accurate in the past.    BP Readings from Last 3 Encounters:  03/12/24 132/82  12/19/23 120/80  10/31/23 138/80      Hypothyroidism:  She reports compliance with her medications.  Dose was increased in 09/2023, adding an extra 1/2 of 88 mcg tablet once a week on Sundays, continuing 88 mcg all other days. She had reported not remembering to take the extra 1/2 tablet weekly on a regular basis (just 3x in the 6 weeks between checks).  TSH was 0.606 on recheck in March.  Dose wasn't changed.   Currently she reports taking ***  Due for recheck today.  She denies any changes in hair/skin/nails, bowels, moods, energy. I'm always dry--skin, scalp, but this is no different for her.   Component Ref Range & Units (hover) 4 mo ago (11/10/23) 6 mo ago (09/22/23) 1 yr ago (09/16/22) 2 yr ago (10/31/21) 2  yr ago (09/12/21) 3 yr ago (12/14/20) 3 yr ago (09/11/20)  TSH 0.606 5.250 High  3.590 1.310 5.380 High  3.460 6.110 High        GERD--She is under the care of Dr. Kristie.  She had been on nightly Omeprazole  40mg  for a long time, and had been switched to famotidine 40mg  BID.  She had stopped that for a while, but needed Tums daily.  She restarted famotidine 40 mg BID per Dr. Nola recommendations. She is doing well on this regimen, denies heartburn, chest pain, dysphagia. Sometimes she will choke on water, doesn't occur with any other beverage. She continues on probiotic daily, Align. She continues to eat a high fiber diet, avoid artificial sweeteners), and hasn't had further fecal incontinence.  She continues to have hemorrhoidal bleeding  frequently. She previously had hemorhoids treated, and is considering having this repeated. Plans to discuss with Dr. Kristie at her next visit. ***UPDATE HEMORRHOIDS    Low back pain:  Had been under the care of neurosurgeon for years, getting epidural injections about q 3 months (last late 2023). She wasn't sure that it made that much of a difference, so has opted not to continue getting these injections. She still has issues with pain into the buttock, but no longer has any pain into the leg. She has pain when she first wakes up in the morning, improves with stretching and walking around.  No longer hurts with prolonged standing.  She completed a course of PT.  She still does her HEP on occasionally (not daily).  Pain isn't much better, but isn't worse, not ready for any repeat injections. ***UPDATE    H/o IFG:  Last A1c was 5.4 in 09/2023, down from 5.7% in 02/2023. Diet as reported above.    History of right breast cancer: s/p lumpectomy and radiation in 2018. Completed tamoxifen  05/2022. She had normal mammogram 03/19/24.     Psoriasis:  She reports this is under control currently. She uses clobetasol  prn. She reports that her skin continues to do well, maintaining. She used to help control her psoriasis by using tanning bed, but none since she was diagnosed with cancer 2018. She sees dermatologist yearly for skin checks.    PMH, PSH, SH reviewed   ROS: no fever, chills, headaches, URI symptoms, dizziness, chest pain, shortness of breath. Denies edema. No urinary complaints. Hemorrhoids bleeding ?***  Denies constipation. No further fecal incontinence. L buttock pain per HPI.  No numbness, tingling, weakness or radiation into leg.   Neck discomfort/tension is mild and chronic, now bilateral (she relates due to stress). Carpal tunnel syndrome is sporadic, on the left, mild/tolerable.   Psoriasis is not currently flaring.  Hair/skin/nails/energy ***   PHYSICAL EXAM:  There were  no vitals taken for this visit.  Wt Readings from Last 3 Encounters:  03/12/24 161 lb (73 kg)  12/19/23 162 lb 6.4 oz (73.7 kg)  09/22/23 174 lb 9.6 oz (79.2 kg)   Pleasant female in no distress HEENT: conjunctiva and sclera are clear, EOMI.  No nasal drainage. OP clear.  Neck: no lymphadenopathy, thyromegaly or mass Heart: regular rate and rhythm, no murmur Lungs: clear bilaterally.   Back: no spinal or CVA tenderness Abdomen: soft, nontender, no mass Extremities: no edema Neuro: alert and oriented, cranial nerves grossly intact, normal gait Psych: normal mood, affect, hygiene and grooming    ASSESSMENT/PLAN:  Is she taking extra 1/2 tablet of 88 mcg Synthroid  on Sundays, or does she forget to do  that? Ensure no biotin recently  Did she get RSV or shingrix from pharmacy?   F/u as scheduled in 09/2024 for CPE

## 2024-03-24 ENCOUNTER — Encounter: Payer: Self-pay | Admitting: Family Medicine

## 2024-03-24 ENCOUNTER — Ambulatory Visit (INDEPENDENT_AMBULATORY_CARE_PROVIDER_SITE_OTHER): Payer: No Typology Code available for payment source | Admitting: Family Medicine

## 2024-03-24 VITALS — BP 128/78 | HR 64 | Ht 63.0 in | Wt 160.2 lb

## 2024-03-24 DIAGNOSIS — Z5181 Encounter for therapeutic drug level monitoring: Secondary | ICD-10-CM

## 2024-03-24 DIAGNOSIS — N390 Urinary tract infection, site not specified: Secondary | ICD-10-CM | POA: Diagnosis not present

## 2024-03-24 DIAGNOSIS — K648 Other hemorrhoids: Secondary | ICD-10-CM

## 2024-03-24 DIAGNOSIS — E039 Hypothyroidism, unspecified: Secondary | ICD-10-CM

## 2024-03-24 DIAGNOSIS — E782 Mixed hyperlipidemia: Secondary | ICD-10-CM

## 2024-03-24 DIAGNOSIS — K219 Gastro-esophageal reflux disease without esophagitis: Secondary | ICD-10-CM | POA: Diagnosis not present

## 2024-03-24 DIAGNOSIS — I10 Essential (primary) hypertension: Secondary | ICD-10-CM | POA: Diagnosis not present

## 2024-03-24 DIAGNOSIS — R7301 Impaired fasting glucose: Secondary | ICD-10-CM | POA: Diagnosis not present

## 2024-03-24 MED ORDER — HYDROCHLOROTHIAZIDE 12.5 MG PO CAPS: 12.5000 mg | ORAL_CAPSULE | Freq: Every day | ORAL | 1 refills | Status: AC

## 2024-03-24 MED ORDER — ROSUVASTATIN CALCIUM 20 MG PO TABS: 20.0000 mg | ORAL_TABLET | ORAL | 1 refills | Status: DC

## 2024-03-24 NOTE — Patient Instructions (Addendum)
 Please get the RSV vaccine from the pharmacy. I also encourage you to get the shingles vaccines (series of 2, given 2 months apart, from the pharmacy) Also get the high dose flu shot in the Fall. Consider the updated COVID booster, when available, in the Fall.  Talk about the bladder infections with your gynecologist at your upcoming appointment. Pay attention as to whether the infections could be related to timing of intercourse (in which case we can give antibiotics to be used after sex). If not correlation, may be related to atrophic changes from being menopausal, and topical estrogens can sometimes help.  Consider using suppositories (rather than waiting for the hemorrhoids to pop out and then using a cream) to treat your internal hemorrhoids.  I recommend doing your home exercise program for your back (that you got when discharged from physical therapy) once daily.

## 2024-03-25 DIAGNOSIS — I10 Essential (primary) hypertension: Secondary | ICD-10-CM | POA: Diagnosis not present

## 2024-03-25 DIAGNOSIS — E782 Mixed hyperlipidemia: Secondary | ICD-10-CM | POA: Diagnosis not present

## 2024-03-25 DIAGNOSIS — E039 Hypothyroidism, unspecified: Secondary | ICD-10-CM | POA: Diagnosis not present

## 2024-03-25 DIAGNOSIS — Z5181 Encounter for therapeutic drug level monitoring: Secondary | ICD-10-CM | POA: Diagnosis not present

## 2024-03-26 ENCOUNTER — Ambulatory Visit: Payer: Self-pay | Admitting: Family Medicine

## 2024-03-26 LAB — COMPREHENSIVE METABOLIC PANEL WITH GFR
ALT: 19 IU/L (ref 0–32)
AST: 25 IU/L (ref 0–40)
Albumin: 4.4 g/dL (ref 3.8–4.8)
Alkaline Phosphatase: 81 IU/L (ref 44–121)
BUN/Creatinine Ratio: 18 (ref 12–28)
BUN: 15 mg/dL (ref 8–27)
Bilirubin Total: 0.4 mg/dL (ref 0.0–1.2)
CO2: 24 mmol/L (ref 20–29)
Calcium: 10.1 mg/dL (ref 8.7–10.3)
Chloride: 103 mmol/L (ref 96–106)
Creatinine, Ser: 0.85 mg/dL (ref 0.57–1.00)
Globulin, Total: 3 g/dL (ref 1.5–4.5)
Glucose: 105 mg/dL — ABNORMAL HIGH (ref 70–99)
Potassium: 4.6 mmol/L (ref 3.5–5.2)
Sodium: 141 mmol/L (ref 134–144)
Total Protein: 7.4 g/dL (ref 6.0–8.5)
eGFR: 71 mL/min/1.73 (ref >59)

## 2024-03-26 LAB — LIPID PANEL
Chol/HDL Ratio: 3 ratio (ref 0.0–4.4)
Cholesterol, Total: 216 mg/dL — ABNORMAL HIGH (ref 100–199)
HDL: 73 mg/dL (ref >39)
LDL Chol Calc (NIH): 117 mg/dL — ABNORMAL HIGH (ref 0–99)
Triglycerides: 151 mg/dL — ABNORMAL HIGH (ref 0–149)
VLDL Cholesterol Cal: 26 mg/dL (ref 5–40)

## 2024-03-26 LAB — TSH: TSH: 1.95 u[IU]/mL (ref 0.450–4.500)

## 2024-04-05 ENCOUNTER — Other Ambulatory Visit: Payer: Self-pay | Admitting: Family Medicine

## 2024-04-05 DIAGNOSIS — E782 Mixed hyperlipidemia: Secondary | ICD-10-CM

## 2024-04-22 ENCOUNTER — Ambulatory Visit
Admission: RE | Admit: 2024-04-22 | Discharge: 2024-04-22 | Disposition: A | Discharge status: Home / Self Care | Source: Ambulatory Visit | Attending: Nurse Practitioner | Admitting: Nurse Practitioner

## 2024-04-22 VITALS — BP 125/78 | HR 89 | Temp 98.2°F | Resp 18

## 2024-04-22 DIAGNOSIS — J069 Acute upper respiratory infection, unspecified: Secondary | ICD-10-CM

## 2024-04-22 LAB — POC SOFIA SARS ANTIGEN FIA: SARS Coronavirus 2 Ag: NEGATIVE

## 2024-04-22 MED ORDER — FLUTICASONE PROPIONATE 50 MCG/ACT NA SUSP: 2.0000 | Freq: Every day | NASAL | 0 refills | Status: DC

## 2024-04-22 MED ORDER — PROMETHAZINE-DM 6.25-15 MG/5ML PO SYRP: 5.0000 mL | ORAL_SOLUTION | Freq: Four times a day (QID) | ORAL | 0 refills | Status: DC | PRN

## 2024-04-22 MED ORDER — DOXYCYCLINE HYCLATE 100 MG PO CAPS: 100.0000 mg | ORAL_CAPSULE | Freq: Two times a day (BID) | ORAL | 0 refills | Status: AC

## 2024-04-22 NOTE — Discharge Instructions (Addendum)
 The COVID test was negative. Take medication as directed.  If your symptoms do not begin to improve over the next 5 to 7 days, you may begin the antibiotic. Continue your current allergy medication regimen. Increase fluids and get plenty of rest. May take over-the-counter Tylenol  as needed for pain, fever, or general discomfort. Recommend normal saline nasal spray to help with nasal congestion throughout the day. For your cough, it may be helpful to use a humidifier at bedtime during sleep. If symptoms fail to improve with this treatment, you may follow-up in this clinic or with your primary care physician for further evaluation. Follow-up as needed.

## 2024-04-22 NOTE — ED Provider Notes (Signed)
 [facility]    CSN: [id number] Arrival date & time: 04/22/24  1449      History   Chief Complaint Chief Complaint  Patient presents with   Cough    Sinus inflammation with bad cough. - Entered by patient    HPI Mckenzie Webb is a 78 y.o. female.   The history is provided by the patient.   Patient presents with a several day history of cough, headache, scratchy throat, nasal congestion, and sinus pressure.  Patient denies fever, chills, ear pain, ear drainage, wheezing, difficulty breathing, abdominal pain, nausea, vomiting, diarrhea, or rash.  Patient states that her husband is sick with the same or similar symptoms, stated that her started first.  So far, states she has been taking Mucinex for her symptoms.  Reports that she does have a history of seasonal allergies, reports that she has been taking her Claritin daily.  Past Medical History:  Diagnosis Date   Arthritis    lower back   Cough 07/22/2017   Dental crowns present    Family history of adverse reaction to anesthesia    pt's sister has hx. of post-op N/V   GERD (gastroesophageal reflux disease)    History of chemotherapy    finished chemo 06/30/2017   History of radiation therapy 07/29/17- 08/28/17   Right Breast and Axilla treated to 42.56 Gy with 16 fx of 2.66. Boost of 8 Gy with 4 fx of 2 Gy.    History of right breast cancer 02/2017   Hyperlipidemia    Hypothyroid    Leukopenia 07/07/2017   Personal history of chemotherapy    Personal history of radiation therapy    Stuffy and runny nose 07/22/2017   clear drainage from nose, per pt.    Patient Active Problem List   Diagnosis Date Noted   Cystitis 03/12/2024   Vaginal irritation 03/12/2024   Personal history of breast cancer 03/19/2023   Chronic midline low back pain with left-sided sciatica 03/19/2023   Mixed hyperlipidemia 09/17/2022   Senile purpura (HCC) 03/10/2020   Plant dermatitis 12/13/2019   Hot flashes due to tamoxifen   09/06/2019   Morbid exogenous obesity (HCC) 09/28/2017   Port catheter in place 04/28/2017   Genetic testing 04/17/2017   Family history of pancreatic cancer    Family history of colon cancer    Malignant neoplasm of upper-inner quadrant of right breast in female, estrogen receptor positive (HCC) 03/11/2017   Essential hypertension, benign 07/31/2015   IFG (impaired fasting glucose) 05/03/2015   Psoriasis 05/03/2014   Greater trochanteric bursitis of right hip 03/14/2014   Obesity (BMI 30-39.9) 05/26/2013   GERD (gastroesophageal reflux disease) 12/31/2010   Hypothyroidism 12/31/2010   Vitamin D  deficiency 12/31/2010   Pure hypercholesterolemia 12/31/2010    Past Surgical History:  Procedure Laterality Date   BREAST BIOPSY Right 03/31/2023   US  RT BREAST BX W LOC DEV 1ST LESION IMG BX SPEC US  GUIDE 03/31/2023 [facility]   BREAST LUMPECTOMY Right 03/26/2017   BREAST LUMPECTOMY WITH RADIOACTIVE SEED AND SENTINEL LYMPH NODE BIOPSY Right 03/26/2017   Procedure: BREAST LUMPECTOMY WITH RADIOACTIVE SEED AND SENTINEL LYMPH NODE BIOPSY;  Surgeon: Curvin Deward MOULD, MD;  Location: [facility];  Service: General;  Laterality: Right;   CARPAL TUNNEL RELEASE Right 01/05/2021   R CTR with RMF trigger release (Dr. Camella)   CATARACT EXTRACTION Left 2021   CHOLECYSTECTOMY     COLONOSCOPY  10/2007   CYST EXCISION  02/01/2003  knee   KNEE ARTHROSCOPY Right 1990   KNEE ARTHROSCOPY Left 12/12/2010   PORT-A-CATH REMOVAL N/A 09/03/2017   Procedure: REMOVAL PORT-A-CATH;  Surgeon: Curvin Deward MOULD, MD;  Location: [facility];  Service: General;  Laterality: N/A;   PORTACATH PLACEMENT Left 04/24/2017   Procedure: INSERTION PORT-A-CATH;  Surgeon: Curvin Deward MOULD, MD;  Location: [facility];  Service: General;  Laterality: Left;   TUBAL LIGATION  1983   VARICOSE VEIN SURGERY  09/2009 R, 06/2010 L    OB History     Gravida  2   Para  2   Term      Preterm      AB       Living  2      SAB      IAB      Ectopic      Multiple      Live Births               Home Medications    Prior to Admission medications   Medication Sig Start Date End Date Taking? Authorizing Provider  acetaminophen  (TYLENOL ) 500 MG tablet Take 1,000 mg by mouth every 6 (six) hours as needed.    [provider]  calcium -vitamin D  (OSCAL WITH D) 500-200 MG-UNIT tablet Take 1 tablet by mouth daily.    [provider]  clobetasol  cream (TEMOVATE ) 0.05 % Apply topically 2 (two) times daily as needed. 04/27/20   Randol Dawes, MD  Coenzyme Q10 (CO Q-10) 100 MG CAPS Take by mouth daily.    [provider]  famotidine (PEPCID) 40 MG tablet Take 40 mg by mouth 2 (two) times daily. 02/04/23   [provider]  hydrochlorothiazide  (MICROZIDE ) 12.5 MG capsule Take 1 capsule (12.5 mg total) by mouth daily. 03/24/24   Randol Dawes, MD  levothyroxine  (SYNTHROID ) 88 MCG tablet TAKE 1 TABLET BY MOUTH DAILY X 6 DAYS, AND 1.5 TABLETS ONCE/WEEK ON SUNDAYS 02/25/24   Lalonde, John C, MD  loratadine (CLARITIN) 10 MG tablet Take 10 mg by mouth daily as needed for allergies.    [provider]  Multiple Vitamins-Minerals (ALIVE WOMENS 50+ PO) Take 1 tablet by mouth daily.     [provider]  Multiple Vitamins-Minerals (EMERGEN-C IMMUNE PO) Take 1 packet by mouth daily.    [provider]  Multiple Vitamins-Minerals (PRESERVISION AREDS 2 PO) Take 1 tablet by mouth in the morning and at bedtime.    [provider]  Omega-3 Fatty Acids (FISH OIL) 1000 MG CAPS Take 3,000 mg by mouth daily.    [provider]  Probiotic Product (ALIGN PO) Take 1 capsule by mouth daily.    [provider]  rosuvastatin  (CRESTOR ) 20 MG tablet Take 1 tablet (20 mg total) by mouth every other day. 03/24/24   Randol Dawes, MD    Family History Family History  Problem Relation Age of Onset   Hypertension Mother    Heart disease Mother     Gallbladder disease Mother    Thyroid  disease Mother    Arthritis Mother    Diabetes Father    Cancer Father        pancreatic   Stroke Sister 9       has since also had seizures   Hypertension Sister    Gallbladder disease Sister    Diabetes Sister    Diverticulitis Sister        of small intestine   COPD Sister  Stroke Sister        late 50's   Hypertension Sister    Pulmonary embolism Sister    Ulcerative colitis Sister    COPD Brother        had lung lobe removed for suspected cancer, but was benign; smoker   AAA (abdominal aortic aneurysm) Brother    Emphysema Brother    Diabetes Son        great toes amputated at 37   Cancer Son        testicular cancer   Lung cancer Cousin        maternal first cousin - smoker   Colon cancer Other 56    Social History Social History   Tobacco Use   Smoking status: Never   Smokeless tobacco: Never  Vaping Use   Vaping status: Never Used  Substance Use Topics   Alcohol use: Not Currently    Comment: none since her cancer diagnosis 2018; rare margarita   Drug use: No     Allergies   Adhesive [tape], Ciprofloxacin , Codeine, Iodine, Polysporin [bacitracin-polymyxin b], Penicillins, and Sulfa antibiotics   Review of Systems Review of Systems Per HPI  Physical Exam Triage Vital Signs ED Triage Vitals  Encounter Vitals Group     BP 04/22/24 1511 125/78     Girls Systolic BP Percentile --      Girls Diastolic BP Percentile --      Boys Systolic BP Percentile --      Boys Diastolic BP Percentile --      Pulse Rate 04/22/24 1511 89     Resp 04/22/24 1511 18     Temp 04/22/24 1511 98.2 F (36.8 C)     Temp Source 04/22/24 1511 Oral     SpO2 04/22/24 1511 96 %     Weight --      Height --      Head Circumference --      Peak Flow --      Pain Score 04/22/24 1512 7     Pain Loc --      Pain Education --      Exclude from Growth Chart --    No data found.  Updated Vital Signs BP 125/78 (BP Location: Left  Arm)   Pulse 89   Temp 98.2 F (36.8 C) (Oral)   Resp 18   SpO2 96%   Visual Acuity Right Eye Distance:   Left Eye Distance:   Bilateral Distance:    Right Eye Near:   Left Eye Near:    Bilateral Near:     Physical Exam Vitals and nursing note reviewed.  Constitutional:      General: She is not in acute distress.    Appearance: Normal appearance.  HENT:     Head: Normocephalic.     Right Ear: Tympanic membrane, ear canal and external ear normal.     Left Ear: Tympanic membrane, ear canal and external ear normal.     Nose: Congestion present.     Right Turbinates: Enlarged and swollen.     Left Turbinates: Enlarged and swollen.     Right Sinus: Maxillary sinus tenderness and frontal sinus tenderness present.     Left Sinus: Maxillary sinus tenderness and frontal sinus tenderness present.     Mouth/Throat:     Lips: Pink.     Mouth: Mucous membranes are moist.     Pharynx: Postnasal drip present. No pharyngeal swelling, oropharyngeal exudate, posterior oropharyngeal erythema or uvula swelling.  Comments: Cobblestoning present to posterior oropharynx  Eyes:     Extraocular Movements: Extraocular movements intact.     Conjunctiva/sclera: Conjunctivae normal.     Pupils: Pupils are equal, round, and reactive to light.  Cardiovascular:     Rate and Rhythm: Normal rate and regular rhythm.     Pulses: Normal pulses.     Heart sounds: Normal heart sounds.  Pulmonary:     Effort: Pulmonary effort is normal. No respiratory distress.     Breath sounds: Normal breath sounds. No stridor. No wheezing, rhonchi or rales.  Abdominal:     General: Bowel sounds are normal.     Palpations: Abdomen is soft.     Tenderness: There is no abdominal tenderness.  Musculoskeletal:     Cervical back: Normal range of motion.  Lymphadenopathy:     Cervical: No cervical adenopathy.  Skin:    General: Skin is warm and dry.  Neurological:     General: No focal deficit present.     Mental  Status: She is alert and oriented to person, place, and time.  Psychiatric:        Mood and Affect: Mood normal.        Behavior: Behavior normal.      UC Treatments / Results  Labs (all labs ordered are listed, but only abnormal results are displayed) Labs Reviewed  POC SOFIA SARS ANTIGEN FIA - Normal    EKG   Radiology No results found.  Procedures Procedures (including critical care time)  Medications Ordered in UC Medications - No data to display  Initial Impression / Assessment and Plan / UC Course  I have reviewed the triage vital signs and the nursing notes.  Pertinent labs & imaging results that were available during my care of the patient were reviewed by me and considered in my medical decision making (see chart for details).  COVID test was negative.  On exam, the patient does have both maxillary and frontal sinus tenderness.  She has been afebrile, is well-appearing, and is in no acute distress, lung sounds are clear throughout.  Will treat with fluticasone  50 mcg nasal spray and Promethazine  DM for her cough.  Prescription for doxycycline  100 mg also sent and if symptoms fail to improve over the next 5 to 7 days.  Supportive care recommendations were provided and discussed with the patient to include fluids, rest, over-the-counter analgesics, normal saline nasal spray, and use of a humidifier during sleep.  Discussed indications with patient regarding follow-up.  Patient was in agreement with this plan of care and verbalizes understanding.  All questions were answered.  Patient stable for discharge.  Final Clinical Impressions(s) / UC Diagnoses   Final diagnoses:  None   Discharge Instructions   None    ED Prescriptions   None    PDMP not reviewed this encounter.   Gilmer Etta PARAS, NP 04/22/24 [id number]

## 2024-04-22 NOTE — ED Triage Notes (Signed)
 Cough, productive at times, headache, scratchy throat since the weekend.  Has been taking mucinex.

## 2024-04-27 DIAGNOSIS — K219 Gastro-esophageal reflux disease without esophagitis: Secondary | ICD-10-CM | POA: Diagnosis not present

## 2024-04-27 DIAGNOSIS — E039 Hypothyroidism, unspecified: Secondary | ICD-10-CM | POA: Diagnosis not present

## 2024-04-27 DIAGNOSIS — E782 Mixed hyperlipidemia: Secondary | ICD-10-CM | POA: Diagnosis not present

## 2024-04-27 DIAGNOSIS — R1310 Dysphagia, unspecified: Secondary | ICD-10-CM | POA: Diagnosis not present

## 2024-05-03 DIAGNOSIS — N950 Postmenopausal bleeding: Secondary | ICD-10-CM | POA: Diagnosis not present

## 2024-05-05 DIAGNOSIS — N950 Postmenopausal bleeding: Secondary | ICD-10-CM | POA: Diagnosis not present

## 2024-05-05 DIAGNOSIS — N368 Other specified disorders of urethra: Secondary | ICD-10-CM | POA: Diagnosis not present

## 2024-05-17 DIAGNOSIS — N950 Postmenopausal bleeding: Secondary | ICD-10-CM | POA: Diagnosis not present

## 2024-05-23 ENCOUNTER — Other Ambulatory Visit: Payer: Self-pay | Admitting: Family Medicine

## 2024-05-23 DIAGNOSIS — E039 Hypothyroidism, unspecified: Secondary | ICD-10-CM

## 2024-06-02 ENCOUNTER — Encounter: Payer: Self-pay | Admitting: Family Medicine

## 2024-06-02 ENCOUNTER — Other Ambulatory Visit (INDEPENDENT_AMBULATORY_CARE_PROVIDER_SITE_OTHER)

## 2024-06-02 DIAGNOSIS — Z23 Encounter for immunization: Secondary | ICD-10-CM | POA: Diagnosis not present

## 2024-06-08 LAB — HM PAP SMEAR

## 2024-06-14 ENCOUNTER — Ambulatory Visit: Admitting: Urology

## 2024-06-14 VITALS — BP 151/78 | HR 71

## 2024-06-14 DIAGNOSIS — N2889 Other specified disorders of kidney and ureter: Secondary | ICD-10-CM

## 2024-06-14 DIAGNOSIS — N362 Urethral caruncle: Secondary | ICD-10-CM | POA: Diagnosis not present

## 2024-06-14 LAB — URINALYSIS, ROUTINE W REFLEX MICROSCOPIC
Bilirubin, UA: NEGATIVE
Glucose, UA: NEGATIVE
Ketones, UA: NEGATIVE
Nitrite, UA: NEGATIVE
Protein,UA: NEGATIVE
RBC, UA: NEGATIVE
Specific Gravity, UA: 1.015 (ref 1.005–1.030)
Urobilinogen, Ur: 0.2 mg/dL (ref 0.2–1.0)
pH, UA: 6.5 (ref 5.0–7.5)

## 2024-06-14 LAB — MICROSCOPIC EXAMINATION
Bacteria, UA: NONE SEEN
RBC, Urine: NONE SEEN /HPF (ref 0–2)

## 2024-06-14 NOTE — Progress Notes (Unsigned)
 06/14/2024 2:14 PM   Mckenzie Webb [birth year] [id number]  Referring provider: Claire Webb LABOR, MD [address] [place],  [place] [address]  Hematuria and urethral mass   HPI: Mckenzie Webb is a 77yo here for evaluation of hematuria and possible urethral mass. Starting 5 weeks ago she had bleeding per vagina and underwent evaluation from gynecology. Uterine biopsy negative. Her bleeding was thought to be coming from the urethra. She has had 4 UTIs in the past year.    PMH: Past Medical History:  Diagnosis Date   Arthritis    lower back   Cough 07/22/2017   Dental crowns present    Family history of adverse reaction to anesthesia    pt's sister has hx. of post-op N/V   GERD (gastroesophageal reflux disease)    History of chemotherapy    finished chemo 06/30/2017   History of radiation therapy 07/29/17- 08/28/17   Right Breast and Axilla treated to 42.56 Gy with 16 fx of 2.66. Boost of 8 Gy with 4 fx of 2 Gy.    History of right breast cancer 02/2017   Hyperlipidemia    Hypothyroid    Leukopenia 07/07/2017   Personal history of chemotherapy    Personal history of radiation therapy    Stuffy and runny nose 07/22/2017   clear drainage from nose, per pt.    Surgical History: Past Surgical History:  Procedure Laterality Date   BREAST BIOPSY Right 03/31/2023   US  RT BREAST BX W LOC DEV 1ST LESION IMG BX SPEC US  GUIDE 03/31/2023 [facility]   BREAST LUMPECTOMY Right 03/26/2017   BREAST LUMPECTOMY WITH RADIOACTIVE SEED AND SENTINEL LYMPH NODE BIOPSY Right 03/26/2017   Procedure: BREAST LUMPECTOMY WITH RADIOACTIVE SEED AND SENTINEL LYMPH NODE BIOPSY;  Surgeon: Curvin Deward MOULD, MD;  Location: [facility];  Service: General;  Laterality: Right;   CARPAL TUNNEL RELEASE Right 01/05/2021   R CTR with RMF trigger release (Dr. Camella)   CATARACT EXTRACTION Left 2021   CHOLECYSTECTOMY     COLONOSCOPY  10/2007   CYST EXCISION  02/01/2003   knee   KNEE  ARTHROSCOPY Right 1990   KNEE ARTHROSCOPY Left 12/12/2010   PORT-A-CATH REMOVAL N/A 09/03/2017   Procedure: REMOVAL PORT-A-CATH;  Surgeon: Curvin Deward MOULD, MD;  Location: [facility];  Service: General;  Laterality: N/A;   PORTACATH PLACEMENT Left 04/24/2017   Procedure: INSERTION PORT-A-CATH;  Surgeon: Curvin Deward MOULD, MD;  Location: [facility];  Service: General;  Laterality: Left;   TUBAL LIGATION  1983   VARICOSE VEIN SURGERY  09/2009 R, 06/2010 L    Home Medications:  Allergies as of 06/14/2024       Reactions   Adhesive [tape] Other (See Comments)   SKIN TEARS   Ciprofloxacin  Nausea Only   Codeine Nausea Only   Iodine Swelling   Polysporin [bacitracin-polymyxin B] Swelling   OPHTHALMIC - SWELLING OF EYES   Penicillins Rash      Sulfa Antibiotics Itching        Medication List        Accurate as of June 14, 2024  2:14 PM. If you have any questions, ask your nurse or doctor.          acetaminophen  500 MG tablet Commonly known as: TYLENOL  Take 1,000 mg by mouth every 6 (six) hours as needed.   ALIGN PO Take 1 capsule by mouth daily.   ALIVE WOMENS 50+ PO Take 1 tablet by mouth daily.  PRESERVISION AREDS 2 PO Take 1 tablet by mouth in the morning and at bedtime.   EMERGEN-C IMMUNE PO Take 1 packet by mouth daily.   calcium -vitamin D  500-200 MG-UNIT tablet Commonly known as: OSCAL WITH D Take 1 tablet by mouth daily.   clobetasol  cream 0.05 % Commonly known as: TEMOVATE  Apply topically 2 (two) times daily as needed.   Co Q-10 100 MG Caps Take by mouth daily.   famotidine 40 MG tablet Commonly known as: PEPCID Take 40 mg by mouth 2 (two) times daily.   Fish Oil 1000 MG Caps Take 3,000 mg by mouth daily.   fluticasone  50 MCG/ACT nasal spray Commonly known as: FLONASE  Place 2 sprays into both nostrils daily.   hydrochlorothiazide  12.5 MG capsule Commonly known as: MICROZIDE  Take 1 capsule (12.5 mg total) by mouth daily.    levothyroxine  88 MCG tablet Commonly known as: SYNTHROID  TAKE 1 TABLET BY MOUTH DAILY FOR 6 DAYS AND 1.5 TAB ONCE WEEKLY ON SUNDAYS   loratadine 10 MG tablet Commonly known as: CLARITIN Take 10 mg by mouth daily as needed for allergies.   promethazine -dextromethorphan 6.25-15 MG/5ML syrup Commonly known as: PROMETHAZINE -DM Take 5 mLs by mouth 4 (four) times daily as needed.   rosuvastatin  20 MG tablet Commonly known as: CRESTOR  Take 1 tablet (20 mg total) by mouth every other day.        Allergies:  Allergies  Allergen Reactions   Adhesive [Tape] Other (See Comments)    SKIN TEARS   Ciprofloxacin  Nausea Only   Codeine Nausea Only   Iodine Swelling   Polysporin [Bacitracin-Polymyxin B] Swelling    OPHTHALMIC - SWELLING OF EYES   Penicillins Rash        Sulfa Antibiotics Itching    Family History: Family History  Problem Relation Age of Onset   Hypertension Mother    Heart disease Mother    Gallbladder disease Mother    Thyroid  disease Mother    Arthritis Mother    Diabetes Father    Cancer Father        pancreatic   Stroke Sister 23       has since also had seizures   Hypertension Sister    Gallbladder disease Sister    Diabetes Sister    Diverticulitis Sister        of small intestine   COPD Sister    Stroke Sister        late 50's   Hypertension Sister    Pulmonary embolism Sister    Ulcerative colitis Sister    COPD Brother        had lung lobe removed for suspected cancer, but was benign; smoker   AAA (abdominal aortic aneurysm) Brother    Emphysema Brother    Diabetes Son        great toes amputated at 24   Cancer Son        testicular cancer   Lung cancer Cousin        maternal first cousin - smoker   Colon cancer Other 73    Social History:  reports that she has never smoked. She has never used smokeless tobacco. She reports that she does not currently use alcohol. She reports that she does not use drugs.  ROS: All other review of  systems were reviewed and are negative except what is noted above in HPI  Physical Exam: BP (!) 151/78   Pulse 71   Constitutional:  Alert and oriented, No acute distress.  HEENT: [place] AT, moist mucus membranes.  Trachea midline, no masses. Cardiovascular: No clubbing, cyanosis, or edema. Respiratory: Normal respiratory effort, no increased work of breathing. GI: Abdomen is soft, nontender, nondistended, no abdominal masses GU: No CVA tenderness. Urethral caruncle present Lymph: No cervical or inguinal lymphadenopathy. Skin: No rashes, bruises or suspicious lesions. Neurologic: Grossly intact, no focal deficits, moving all 4 extremities. Psychiatric: Normal mood and affect.  Laboratory Data: Lab Results  Component Value Date   WBC 5.0 09/22/2023   HGB 13.7 09/22/2023   HCT 41.4 09/22/2023   MCV 96 09/22/2023   PLT 190 09/22/2023    Lab Results  Component Value Date   CREATININE 0.85 03/25/2024    No results found for: PSA  No results found for: TESTOSTERONE  Lab Results  Component Value Date   HGBA1C 5.4 09/22/2023    Urinalysis    Component Value Date/Time   COLORURINE YELLOW 05/02/2017 2201   APPEARANCEUR HAZY (A) 05/02/2017 2201   LABSPEC 1.010 12/19/2023 1224   PHURINE 6.0 05/02/2017 2201   GLUCOSEU NEGATIVE 05/02/2017 2201   HGBUR SMALL (A) 05/02/2017 2201   BILIRUBINUR negative 03/12/2024 1346   BILIRUBINUR neg 08/17/2014 1123   KETONESUR negative 03/12/2024 1346   KETONESUR NEGATIVE 05/02/2017 2201   PROTEINUR =30 (A) 12/19/2023 1224   PROTEINUR NEGATIVE 05/02/2017 2201   UROBILINOGEN 0.2 03/12/2024 1346   NITRITE Negative 03/12/2024 1346   NITRITE POSITIVE (A) 05/02/2017 2201   LEUKOCYTESUR Large (3+) (A) 03/12/2024 1346    Lab Results  Component Value Date   BACTERIA RARE (A) 05/02/2017    Pertinent Imaging:  No results found for this or any previous visit.  No results found for this or any previous visit.  No results found for this or  any previous visit.  No results found for this or any previous visit.  No results found for this or any previous visit.  No results found for this or any previous visit.  No results found for this or any previous visit.  No results found for this or any previous visit.   Assessment & Plan:    1. Urethral caruncle Estrace cream daily for 2 weeks and then 3x per week for 3 months - Urinalysis, Routine w reflex microscopic   No follow-ups on file.  Belvie Clara, MD  [facility] [place]

## 2024-06-15 ENCOUNTER — Encounter: Payer: Self-pay | Admitting: Urology

## 2024-07-30 ENCOUNTER — Ambulatory Visit
Admission: RE | Admit: 2024-07-30 | Discharge: 2024-07-30 | Disposition: A | Discharge status: Home / Self Care | Source: Ambulatory Visit | Attending: Family Medicine

## 2024-07-30 VITALS — BP 128/78 | HR 75 | Temp 97.9°F | Resp 18

## 2024-07-30 DIAGNOSIS — J0100 Acute maxillary sinusitis, unspecified: Secondary | ICD-10-CM | POA: Diagnosis not present

## 2024-07-30 MED ORDER — DOXYCYCLINE HYCLATE 100 MG PO CAPS: 100.0000 mg | ORAL_CAPSULE | Freq: Two times a day (BID) | ORAL | 0 refills | Status: DC

## 2024-07-30 NOTE — ED Triage Notes (Signed)
 Sinus pressure on right side x 3 weeks with some congestion and drainage.  Has tried tylenol  severe sinus

## 2024-07-30 NOTE — ED Provider Notes (Signed)
 [facility]    CSN: [id number] Arrival date & time: 07/30/24  1453      History   Chief Complaint Chief Complaint  Patient presents with   Facial Pain    Sinus infection - Entered by patient    HPI Mckenzie Webb is a 78 y.o. female.   Patient presenting today with 3-week history of congestion, right sided sinus pain and pressure.  Denies fever, chills, chest pain, shortness of breath, abdominal pain, vomiting, diarrhea.  So far trying Tylenol  cold and sinus, emergency, Claritin with minimal relief.    Past Medical History:  Diagnosis Date   Arthritis    lower back   Cough 07/22/2017   Dental crowns present    Family history of adverse reaction to anesthesia    pt's sister has hx. of post-op N/V   GERD (gastroesophageal reflux disease)    History of chemotherapy    finished chemo 06/30/2017   History of radiation therapy 07/29/17- 08/28/17   Right Breast and Axilla treated to 42.56 Gy with 16 fx of 2.66. Boost of 8 Gy with 4 fx of 2 Gy.    History of right breast cancer 02/2017   Hyperlipidemia    Hypothyroid    Leukopenia 07/07/2017   Personal history of chemotherapy    Personal history of radiation therapy    Stuffy and runny nose 07/22/2017   clear drainage from nose, per pt.    Patient Active Problem List   Diagnosis Date Noted   Cystitis 03/12/2024   Vaginal irritation 03/12/2024   Personal history of breast cancer 03/19/2023   Chronic midline low back pain with left-sided sciatica 03/19/2023   Mixed hyperlipidemia 09/17/2022   Senile purpura 03/10/2020   Plant dermatitis 12/13/2019   Hot flashes due to tamoxifen  09/06/2019   Morbid exogenous obesity (HCC) 09/28/2017   Port catheter in place 04/28/2017   Genetic testing 04/17/2017   Family history of pancreatic cancer    Family history of colon cancer    Malignant neoplasm of upper-inner quadrant of right breast in female, estrogen receptor positive (HCC) 03/11/2017   Essential  hypertension, benign 07/31/2015   IFG (impaired fasting glucose) 05/03/2015   Psoriasis 05/03/2014   Greater trochanteric bursitis of right hip 03/14/2014   Obesity (BMI 30-39.9) 05/26/2013   GERD (gastroesophageal reflux disease) 12/31/2010   Hypothyroidism 12/31/2010   Vitamin D  deficiency 12/31/2010   Pure hypercholesterolemia 12/31/2010    Past Surgical History:  Procedure Laterality Date   BREAST BIOPSY Right 03/31/2023   US  RT BREAST BX W LOC DEV 1ST LESION IMG BX SPEC US  GUIDE 03/31/2023 [facility]MO[facility]   BREAST LUMPECTOMY Right 03/26/2017   BREAST LUMPECTOMY WITH RADIOACTIVE SEED AND SENTINEL LYMPH NODE BIOPSY Right 03/26/2017   Procedure: BREAST LUMPECTOMY WITH RADIOACTIVE SEED AND SENTINEL LYMPH NODE BIOPSY;  Surgeon: Curvin Deward MOULD, MD;  Location: [facility];  Service: General;  Laterality: Right;   CARPAL TUNNEL RELEASE Right 01/05/2021   R CTR with RMF trigger release (Dr. Camella)   CATARACT EXTRACTION Left 2021   CHOLECYSTECTOMY     COLONOSCOPY  10/2007   CYST EXCISION  02/01/2003   knee   KNEE ARTHROSCOPY Right 1990   KNEE ARTHROSCOPY Left 12/12/2010   PORT-A-CATH REMOVAL N/A 09/03/2017   Procedure: REMOVAL PORT-A-CATH;  Surgeon: Curvin Deward MOULD, MD;  Location: [facility];  Service: General;  Laterality: N/A;   PORTACATH PLACEMENT Left 04/24/2017   Procedure: INSERTION PORT-A-CATH;  Surgeon: Curvin Deward  III, MD;  Location: [facility];  Service: General;  Laterality: Left;   TUBAL LIGATION  1983   VARICOSE VEIN SURGERY  09/2009 R, 06/2010 L    OB History     Gravida  2   Para  2   Term      Preterm      AB      Living  2      SAB      IAB      Ectopic      Multiple      Live Births               Home Medications    Prior to Admission medications  Medication Sig Start Date End Date Taking? Authorizing Provider  doxycycline  (VIBRAMYCIN ) 100 MG capsule Take 1 capsule (100 mg total) by mouth 2 (two) times daily.  07/30/24  Yes Stuart Vernell Norris, PA-C  acetaminophen  (TYLENOL ) 500 MG tablet Take 1,000 mg by mouth every 6 (six) hours as needed.    [provider]  calcium -vitamin D  (OSCAL WITH D) 500-200 MG-UNIT tablet Take 1 tablet by mouth daily.    [provider]  clobetasol  cream (TEMOVATE ) 0.05 % Apply topically 2 (two) times daily as needed. 04/27/20   Randol Dawes, MD  Coenzyme Q10 (CO Q-10) 100 MG CAPS Take by mouth daily.    [provider]  famotidine (PEPCID) 40 MG tablet Take 40 mg by mouth 2 (two) times daily. 02/04/23   [provider]  fluticasone  (FLONASE ) 50 MCG/ACT nasal spray Place 2 sprays into both nostrils daily. 04/22/24   Leath-Warren, Etta PARAS, NP  hydrochlorothiazide  (MICROZIDE ) 12.5 MG capsule Take 1 capsule (12.5 mg total) by mouth daily. 03/24/24   Randol Dawes, MD  levothyroxine  (SYNTHROID ) 88 MCG tablet TAKE 1 TABLET BY MOUTH DAILY FOR 6 DAYS AND 1.5 TAB ONCE WEEKLY ON SUNDAYS 05/24/24   Randol Dawes, MD  loratadine (CLARITIN) 10 MG tablet Take 10 mg by mouth daily as needed for allergies.    [provider]  Multiple Vitamins-Minerals (ALIVE WOMENS 50+ PO) Take 1 tablet by mouth daily.     [provider]  Multiple Vitamins-Minerals (EMERGEN-C IMMUNE PO) Take 1 packet by mouth daily.    [provider]  Multiple Vitamins-Minerals (PRESERVISION AREDS 2 PO) Take 1 tablet by mouth in the morning and at bedtime.    [provider]  Omega-3 Fatty Acids (FISH OIL) 1000 MG CAPS Take 3,000 mg by mouth daily.    [provider]  Probiotic Product (ALIGN PO) Take 1 capsule by mouth daily.    [provider]  rosuvastatin  (CRESTOR ) 20 MG tablet Take 1 tablet (20 mg total) by mouth every other day. 03/24/24   Randol Dawes, MD    Family History Family History  Problem Relation Age of Onset   Hypertension Mother    Heart disease Mother    Gallbladder disease Mother    Thyroid  disease Mother    Arthritis  Mother    Diabetes Father    Cancer Father        pancreatic   Stroke Sister 85       has since also had seizures   Hypertension Sister    Gallbladder disease Sister    Diabetes Sister    Diverticulitis Sister        of small intestine   COPD Sister    Stroke Sister        late 38's  Hypertension Sister    Pulmonary embolism Sister    Ulcerative colitis Sister    COPD Brother        had lung lobe removed for suspected cancer, but was benign; smoker   AAA (abdominal aortic aneurysm) Brother    Emphysema Brother    Diabetes Son        great toes amputated at 84   Cancer Son        testicular cancer   Lung cancer Cousin        maternal first cousin - smoker   Colon cancer Other 27    Social History Social History[1]   Allergies   Adhesive [tape], Ciprofloxacin , Codeine, Iodine, Polysporin [bacitracin-polymyxin b], Penicillins, and Sulfa antibiotics   Review of Systems Review of Systems PER HPI  Physical Exam Triage Vital Signs ED Triage Vitals  Encounter Vitals Group     BP 07/30/24 1504 128/78     Girls Systolic BP Percentile --      Girls Diastolic BP Percentile --      Boys Systolic BP Percentile --      Boys Diastolic BP Percentile --      Pulse Rate 07/30/24 1504 75     Resp 07/30/24 1504 18     Temp 07/30/24 1504 97.9 F (36.6 C)     Temp Source 07/30/24 1504 Oral     SpO2 07/30/24 1504 98 %     Weight --      Height --      Head Circumference --      Peak Flow --      Pain Score 07/30/24 1505 8     Pain Loc --      Pain Education --      Exclude from Growth Chart --    No data found.  Updated Vital Signs BP 128/78 (BP Location: Right Arm)   Pulse 75   Temp 97.9 F (36.6 C) (Oral)   Resp 18   SpO2 98%   Visual Acuity Right Eye Distance:   Left Eye Distance:   Bilateral Distance:    Right Eye Near:   Left Eye Near:    Bilateral Near:     Physical Exam Vitals and nursing note reviewed.  Constitutional:      Appearance: Normal  appearance.  HENT:     Head: Atraumatic.     Right Ear: Tympanic membrane and external ear normal.     Left Ear: Tympanic membrane and external ear normal.     Nose: Congestion present.     Mouth/Throat:     Mouth: Mucous membranes are moist.     Pharynx: Posterior oropharyngeal erythema present.  Eyes:     Extraocular Movements: Extraocular movements intact.     Conjunctiva/sclera: Conjunctivae normal.  Cardiovascular:     Rate and Rhythm: Normal rate and regular rhythm.     Heart sounds: Normal heart sounds.  Pulmonary:     Effort: Pulmonary effort is normal.     Breath sounds: Normal breath sounds. No wheezing or rales.  Musculoskeletal:        General: Normal range of motion.     Cervical back: Normal range of motion and neck supple.  Skin:    General: Skin is warm and dry.  Neurological:     Mental Status: She is alert and oriented to person, place, and time.  Psychiatric:        Mood and Affect: Mood normal.  Thought Content: Thought content normal.      UC Treatments / Results  Labs (all labs ordered are listed, but only abnormal results are displayed) Labs Reviewed - No data to display  EKG   Radiology No results found.  Procedures Procedures (including critical care time)  Medications Ordered in UC Medications - No data to display  Initial Impression / Assessment and Plan / UC Course  I have reviewed the triage vital signs and the nursing notes.  Pertinent labs & imaging results that were available during my care of the patient were reviewed by me and considered in my medical decision making (see chart for details).     Vital signs and exam overall reassuring, given duration worsening course of symptoms will treat with doxycycline , antihistamines, saline sinus rinses, Astelin twice daily.  Return for worsening or unresolving symptoms.  Final Clinical Impressions(s) / UC Diagnoses   Final diagnoses:  Acute non-recurrent maxillary sinusitis    Discharge Instructions   None    ED Prescriptions     Medication Sig Dispense Auth. Provider   doxycycline  (VIBRAMYCIN ) 100 MG capsule Take 1 capsule (100 mg total) by mouth 2 (two) times daily. 14 capsule Stuart Vernell Norris, [place]      PDMP not reviewed this encounter.    [1]  Social History Tobacco Use   Smoking status: Never   Smokeless tobacco: Never  Vaping Use   Vaping status: Never Used  Substance Use Topics   Alcohol use: Not Currently    Comment: none since her cancer diagnosis 2018; rare margarita   Drug use: No     Stuart Vernell Norris, [place] 07/30/24 1555

## 2024-08-22 ENCOUNTER — Other Ambulatory Visit: Payer: Self-pay | Admitting: Family Medicine

## 2024-08-22 DIAGNOSIS — E039 Hypothyroidism, unspecified: Secondary | ICD-10-CM

## 2024-09-14 ENCOUNTER — Other Ambulatory Visit: Payer: Self-pay | Admitting: Family Medicine

## 2024-09-14 DIAGNOSIS — E782 Mixed hyperlipidemia: Secondary | ICD-10-CM

## 2024-09-20 ENCOUNTER — Telehealth: Admitting: Family Medicine

## 2024-09-20 ENCOUNTER — Encounter: Payer: Self-pay | Admitting: Family Medicine

## 2024-09-20 VITALS — Ht 64.0 in | Wt 163.0 lb

## 2024-09-20 DIAGNOSIS — B3731 Acute candidiasis of vulva and vagina: Secondary | ICD-10-CM | POA: Diagnosis not present

## 2024-09-20 MED ORDER — FLUCONAZOLE 150 MG PO TABS: ORAL_TABLET | ORAL | 0 refills | Status: AC

## 2024-09-20 NOTE — Patient Instructions (Signed)
 We are treating you presumptively for a possible yeast infection. Take the diflucan  tablet once. It could take a few days for the symptoms to improve. If you have persistent symptoms, you should take the second tablet IN A WEEK (not in a row).  If you don't need it, keep it on hand for any future yeast infections.  If your symptoms don't resolve, follow-up with your OB-GYN.

## 2024-10-06 ENCOUNTER — Ambulatory Visit: Payer: Self-pay | Admitting: Family Medicine

## 2024-10-15 ENCOUNTER — Ambulatory Visit: Admitting: Urology
